# Patient Record
Sex: Male | Born: 1950
Health system: Southern US, Community
[De-identification: ages and names within clinical notes are randomized; demographics above are authoritative.]

## PROBLEM LIST (undated history)

## (undated) DIAGNOSIS — H9190 Unspecified hearing loss, unspecified ear: Secondary | ICD-10-CM

## (undated) DIAGNOSIS — C099 Malignant neoplasm of tonsil, unspecified: Secondary | ICD-10-CM

## (undated) DIAGNOSIS — R195 Other fecal abnormalities: Secondary | ICD-10-CM

## (undated) DIAGNOSIS — E785 Hyperlipidemia, unspecified: Secondary | ICD-10-CM

## (undated) DIAGNOSIS — F4310 Post-traumatic stress disorder, unspecified: Secondary | ICD-10-CM

## (undated) DIAGNOSIS — K1379 Other lesions of oral mucosa: Secondary | ICD-10-CM

## (undated) HISTORY — DX: Other fecal abnormalities: R19.5

## (undated) HISTORY — DX: Malignant neoplasm of tonsil, unspecified: C09.9

## (undated) HISTORY — DX: Hyperlipidemia, unspecified: E78.5

## (undated) HISTORY — PX: FINGER FRACTURE SURGERY: SHX638

## (undated) HISTORY — PX: COLONOSCOPY W/ POLYPECTOMY: SHX1380

## (undated) HISTORY — DX: Other lesions of oral mucosa: K13.79

---
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
## 2001-03-05 ENCOUNTER — Ambulatory Visit (HOSPITAL_BASED_OUTPATIENT_CLINIC_OR_DEPARTMENT_OTHER): Admission: RE | Admit: 2001-03-05 | Discharge: 2001-03-05 | Payer: Self-pay | Admitting: Family Medicine

## 2001-09-12 ENCOUNTER — Ambulatory Visit (HOSPITAL_BASED_OUTPATIENT_CLINIC_OR_DEPARTMENT_OTHER): Admission: RE | Admit: 2001-09-12 | Discharge: 2001-09-12 | Payer: Self-pay | Admitting: Family Medicine

## 2001-09-27 ENCOUNTER — Ambulatory Visit (HOSPITAL_COMMUNITY): Admission: RE | Admit: 2001-09-27 | Discharge: 2001-09-27 | Payer: Self-pay | Admitting: Family Medicine

## 2001-11-01 ENCOUNTER — Ambulatory Visit (HOSPITAL_COMMUNITY): Admission: RE | Admit: 2001-11-01 | Discharge: 2001-11-01 | Payer: Self-pay | Admitting: *Deleted

## 2003-07-29 ENCOUNTER — Encounter: Payer: Self-pay | Admitting: Emergency Medicine

## 2003-07-29 ENCOUNTER — Emergency Department (HOSPITAL_COMMUNITY): Admission: EM | Admit: 2003-07-29 | Discharge: 2003-07-29 | Payer: Self-pay | Admitting: Emergency Medicine

## 2015-12-19 NOTE — Progress Notes (Unsigned)
B/P : 140/77 Glucose: non Fasting: 100 taken by [facility] RN

## 2018-05-12 ENCOUNTER — Encounter: Payer: Self-pay | Admitting: Family Medicine

## 2018-05-12 ENCOUNTER — Ambulatory Visit (INDEPENDENT_AMBULATORY_CARE_PROVIDER_SITE_OTHER): Payer: Medicare Other | Admitting: Family Medicine

## 2018-05-12 VITALS — BP 132/73 | HR 87 | Temp 98.8°F | Ht 68.0 in | Wt 185.0 lb

## 2018-05-12 DIAGNOSIS — K1379 Other lesions of oral mucosa: Secondary | ICD-10-CM

## 2018-05-12 DIAGNOSIS — Z13228 Encounter for screening for other metabolic disorders: Secondary | ICD-10-CM | POA: Diagnosis not present

## 2018-05-12 DIAGNOSIS — Z7689 Persons encountering health services in other specified circumstances: Secondary | ICD-10-CM

## 2018-05-12 DIAGNOSIS — K1370 Unspecified lesions of oral mucosa: Secondary | ICD-10-CM | POA: Diagnosis not present

## 2018-05-12 DIAGNOSIS — R195 Other fecal abnormalities: Secondary | ICD-10-CM

## 2018-05-12 DIAGNOSIS — Z13220 Encounter for screening for lipoid disorders: Secondary | ICD-10-CM

## 2018-05-12 DIAGNOSIS — Z1159 Encounter for screening for other viral diseases: Secondary | ICD-10-CM | POA: Diagnosis not present

## 2018-05-12 HISTORY — DX: Other lesions of oral mucosa: K13.79

## 2018-05-12 HISTORY — DX: Other fecal abnormalities: R19.5

## 2018-05-12 NOTE — Progress Notes (Signed)
Subjective: ON:GEXBMWUXL care, oral concern HPI: [name] is a 67 y.o. male presenting to clinic today for:  1. Oral concern Patient reports onset of left-sided throat irritation about 6 years ago.  He notes that he thought that this was food caught in the tonsil or tonsil stone.  He has not had it evaluated because it was never particularly bothersome.  He notes that he feels like it seems more irritated and occasionally expels coagulated discharge, particularly when he clears his throat or brushes his teeth.  He reports associated left-sided neck pain and fullness.  No night sweats, unplanned weight loss, fevers, chills.  He was a smoker for about 15 years but discontinued about 19 years ago.  He is a retired English as a second language teacher.  2.  Positive fecal occult blood test Patient had a nurse come in for a wellness visit and he had a fit test performed.  This was positive.  He notes that he had polyps on colonoscopy over 10 years ago but has not had a colonoscopy since.  Last colonoscopy was done at some office in [place].  He is unsure which.  He would like to seek care within [facility] if possible.  Denies any gross blood in stool, nausea, vomiting, unplanned weight loss.  Past Medical History:  Diagnosis Date  . Hyperlipidemia    Past Surgical History:  Procedure Laterality Date  . APPENDECTOMY    . FINGER FRACTURE SURGERY Left    Social History   Socioeconomic History  . Marital status: Married    Spouse name: Not on file  . Number of children: Not on file  . Years of education: Not on file  . Highest education level: Not on file  Occupational History  . Occupation: English as a second language teacher, retired  Scientific laboratory technician  . Financial resource strain: Not on file  . Food insecurity:    Worry: Not on file    Inability: Not on file  . Transportation needs:    Medical: Not on file    Non-medical: Not on file  Tobacco Use  . Smoking status: Former Smoker    Years: 15.00    Types: Cigarettes   Last attempt to quit: 2000    Years since quitting: 19.4  . Smokeless tobacco: Never Used  Substance and Sexual Activity  . Alcohol use: Yes    Alcohol/week: 7.2 oz    Types: 12 Cans of beer per week  . Drug use: Never  . Sexual activity: Not Currently  Lifestyle  . Physical activity:    Days per week: Not on file    Minutes per session: Not on file  . Stress: Not on file  Relationships  . Social connections:    Talks on phone: Not on file    Gets together: Not on file    Attends religious service: Not on file    Active member of club or organization: Not on file    Attends meetings of clubs or organizations: Not on file    Relationship status: Not on file  . Intimate partner violence:    Fear of current or ex partner: Not on file    Emotionally abused: Not on file    Physically abused: Not on file    Forced sexual activity: Not on file  Other Topics Concern  . 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  He lives independently with his dog here in [place].  He  formally lived in [place].   No outpatient medications have been marked as taking for the 05/12/18 encounter (Office Visit) with [name], DO.   Family History  Problem Relation Age of Onset  . Diabetes Father    No Known Allergies   Health Maintenance: Colonoscopy, Hep C screen  ROS: Per HPI  Objective: Office vital signs reviewed. BP 132/73   Pulse 87   Temp 98.8 F (37.1 C) (Oral)   Ht '5\' 8"'  (1.727 m)   Wt 185 lb (83.9 kg)   BMI 28.13 kg/m    Physical Examination:  General: Awake, alert, well nourished, nontoxic, No acute distress HEENT: Normal    Neck: + enlarged and firm left sided cervical lymph node. No carotid bruits, no jvd    Ears: Tympanic membranes intact, normal light reflex, no erythema, no bulging    Eyes: PERRLA, extraocular movement in tact, sclera white    Nose: nasal turbinates moist, clear nasal discharge     Throat: moist mucus membranes, foul odor to breath appreciated.  Fungating mass appreciated along the left side of the oral cavity. Cardio: regular rate and rhythm, S1S2 heard, no murmurs appreciated Pulm: clear to auscultation bilaterally, no wheezes, rhonchi or rales; normal work of breathing on room air MSK: Ambulates independently.  Normal strength and tone. Psych: Mood stable, speech normal, affect appropriate, interactive, pleasant. Neuro: Hard of hearing but otherwise no focal neurologic deficits.  Depression screen PHQ 2/9 05/12/2018  Decreased Interest 0  Down, Depressed, Hopeless 0  PHQ - 2 Score 0    Assessment/ Plan: 67 y.o. male   1. Mass of oral cavity Concerning for oral cancer.  Risk factors include smoking history.  I have placed an urgent referral to ear nose and throat for further evaluation and consideration of biopsy.  I did discuss with patient that cancer could not be ruled out at this point but that this is not a definitive diagnosis until biopsy has been obtained.  Patient was somewhat tearful but voiced good understanding.  I encouraged him to call me with any questions or concerns. - Ambulatory referral to ENT - CBC with Differential  2. Positive FIT (fecal immunochemical test) History of polyps.  Will place referral to gastroenterology for further evaluation.  I have referred him to [facility] per his request. - Ambulatory referral to Gastroenterology  3. Encounter to establish care with new doctor Release of information form completed.  Will obtain previous medical records.  4. Encounter for screening for metabolic disorder - [id number]  5. Screening for lipid disorders - Lipid Panel  6. Encounter for hepatitis C screening test for low risk patient - Hepatitis C antibody   [name], DO [place] [phone]

## 2018-05-12 NOTE — Patient Instructions (Addendum)
The area of concern in the left side of your mouth appears to be a mass.  I would like you to see the ear, nose and throat specialist as soon as possible.  I have placed this referral.  I have also placed a referral to the gastroenterologist in [place] for the blood that was seen in your stool.  This may be from a polyp since you have had these in the past but I do think that it is worth further evaluating with a colonoscopy.  You had labs performed today.  You will be contacted with the results of the labs once they are available, usually in the next 3 days for routine lab work.

## 2018-05-13 ENCOUNTER — Encounter: Payer: Self-pay | Admitting: Hematology

## 2018-05-13 ENCOUNTER — Ambulatory Visit (INDEPENDENT_AMBULATORY_CARE_PROVIDER_SITE_OTHER): Payer: Medicare Other | Admitting: Otolaryngology

## 2018-05-13 DIAGNOSIS — R1312 Dysphagia, oropharyngeal phase: Secondary | ICD-10-CM | POA: Diagnosis not present

## 2018-05-13 DIAGNOSIS — C140 Malignant neoplasm of pharynx, unspecified: Secondary | ICD-10-CM

## 2018-05-13 LAB — LIPID PANEL
Chol/HDL Ratio: 5.2 ratio — ABNORMAL HIGH (ref 0.0–5.0)
Cholesterol, Total: 182 mg/dL (ref 100–199)
HDL: 35 mg/dL — ABNORMAL LOW (ref >39)
LDL Calculated: 127 mg/dL — ABNORMAL HIGH (ref 0–99)
Triglycerides: 102 mg/dL (ref 0–149)
VLDL Cholesterol Cal: 20 mg/dL (ref 5–40)

## 2018-05-13 LAB — CBC WITH DIFFERENTIAL/PLATELET
Basophils Absolute: 0 10*3/uL (ref 0.0–0.2)
Basos: 0 %
EOS (ABSOLUTE): 0.1 10*3/uL (ref 0.0–0.4)
Eos: 2 %
Hematocrit: 42 % (ref 37.5–51.0)
Hemoglobin: 14.2 g/dL (ref 13.0–17.7)
Immature Grans (Abs): 0 10*3/uL (ref 0.0–0.1)
Immature Granulocytes: 0 %
Lymphocytes Absolute: 1.1 10*3/uL (ref 0.7–3.1)
Lymphs: 32 %
MCH: 29.4 pg (ref 26.6–33.0)
MCHC: 33.8 g/dL (ref 31.5–35.7)
MCV: 87 fL (ref 79–97)
Monocytes Absolute: 0.4 10*3/uL (ref 0.1–0.9)
Monocytes: 11 %
Neutrophils Absolute: 1.9 10*3/uL (ref 1.4–7.0)
Neutrophils: 55 %
Platelets: 256 10*3/uL (ref 150–450)
RBC: 4.83 x10E6/uL (ref 4.14–5.80)
RDW: 13 % (ref 12.3–15.4)
WBC: 3.5 10*3/uL (ref 3.4–10.8)

## 2018-05-13 LAB — CMP14+EGFR
ALT: 10 IU/L (ref 0–44)
AST: 13 IU/L (ref 0–40)
Albumin/Globulin Ratio: 1.6 (ref 1.2–2.2)
Albumin: 4.2 g/dL (ref 3.6–4.8)
Alkaline Phosphatase: 67 IU/L (ref 39–117)
BUN/Creatinine Ratio: 13 (ref 10–24)
BUN: 18 mg/dL (ref 8–27)
Bilirubin Total: 0.6 mg/dL (ref 0.0–1.2)
CO2: 25 mmol/L (ref 20–29)
Calcium: 9.8 mg/dL (ref 8.6–10.2)
Chloride: 101 mmol/L (ref 96–106)
Creatinine, Ser: 1.34 mg/dL — ABNORMAL HIGH (ref 0.76–1.27)
GFR calc Af Amer: 62 mL/min/{1.73_m2} (ref >59)
GFR calc non Af Amer: 54 mL/min/{1.73_m2} — ABNORMAL LOW (ref >59)
Globulin, Total: 2.7 g/dL (ref 1.5–4.5)
Glucose: 98 mg/dL (ref 65–99)
Potassium: 4.2 mmol/L (ref 3.5–5.2)
Sodium: 143 mmol/L (ref 134–144)
Total Protein: 6.9 g/dL (ref 6.0–8.5)

## 2018-05-13 LAB — HEPATITIS C ANTIBODY: Hep C Virus Ab: <0.1 s/co ratio (ref 0.0–0.9)

## 2018-05-14 ENCOUNTER — Telehealth: Payer: Self-pay | Admitting: Family Medicine

## 2018-05-14 ENCOUNTER — Other Ambulatory Visit (INDEPENDENT_AMBULATORY_CARE_PROVIDER_SITE_OTHER): Payer: Self-pay | Admitting: Otolaryngology

## 2018-05-14 DIAGNOSIS — C099 Malignant neoplasm of tonsil, unspecified: Secondary | ICD-10-CM

## 2018-05-14 NOTE — Telephone Encounter (Signed)
Attempted to call patient regarding labs.  No answer and no voicemail.  His kidney function was slightly impaired with a creatinine of 1.34.  No previous labs for comparison.  This may be a result of dehydration or frequent NSAID use.  We will plan to recheck at next visit.  Electrolytes and liver function otherwise within normal limits.  CBC with no significant inflammation, evidence of infection or anemia.  Hepatitis C virus antibody negative.  His ASCVD risk score was 18.7%.  A cholesterol medication is recommended.  I would like to further discuss this with patient prior to starting.  Please have him schedule an appointment with me for cholesterol discussion and recheck of kidney function.  The 10-year ASCVD risk score Mikey Bussing DC Brooke Bonito., et al., 2013) is: 18.7%   Values used to calculate the score:     Age: 67 years     Sex: Male     Is Non-Hispanic African American: No     Diabetic: No     Tobacco smoker: No     Systolic Blood Pressure: 275 mmHg     Is BP treated: No     HDL Cholesterol: 35 mg/dL     Total Cholesterol: 182 mg/dL   Results for orders placed or performed in visit on 05/12/18 (from the past 72 hour(s))  CMP14+EGFR     Status: Abnormal   Collection Time: 05/12/18  9:49 AM  Result Value Ref Range   Glucose 98 65 - 99 mg/dL   BUN 18 8 - 27 mg/dL   Creatinine, Ser 1.34 (H) 0.76 - 1.27 mg/dL   GFR calc non Af Amer 54 (L) >59 mL/min/1.73   GFR calc Af Amer 62 >59 mL/min/1.73   BUN/Creatinine Ratio 13 10 - 24   Sodium 143 134 - 144 mmol/L   Potassium 4.2 3.5 - 5.2 mmol/L   Chloride 101 96 - 106 mmol/L   CO2 25 20 - 29 mmol/L   Calcium 9.8 8.6 - 10.2 mg/dL   Total Protein 6.9 6.0 - 8.5 g/dL   Albumin 4.2 3.6 - 4.8 g/dL   Globulin, Total 2.7 1.5 - 4.5 g/dL   Albumin/Globulin Ratio 1.6 1.2 - 2.2   Bilirubin Total 0.6 0.0 - 1.2 mg/dL   Alkaline Phosphatase 67 39 - 117 IU/L   AST 13 0 - 40 IU/L   ALT 10 0 - 44 IU/L  CBC with Differential     Status: None   Collection Time:  05/12/18  9:49 AM  Result Value Ref Range   WBC 3.5 3.4 - 10.8 x10E3/uL   RBC 4.83 4.14 - 5.80 x10E6/uL   Hemoglobin 14.2 13.0 - 17.7 g/dL   Hematocrit 42.0 37.5 - 51.0 %   MCV 87 79 - 97 fL   MCH 29.4 26.6 - 33.0 pg   MCHC 33.8 31.5 - 35.7 g/dL   RDW 13.0 12.3 - 15.4 %   Platelets 256 150 - 450 x10E3/uL    Comment:               **Please note reference interval change**   Neutrophils 55 Not Estab. %   Lymphs 32 Not Estab. %   Monocytes 11 Not Estab. %   Eos 2 Not Estab. %   Basos 0 Not Estab. %   Neutrophils Absolute 1.9 1.4 - 7.0 x10E3/uL   Lymphocytes Absolute 1.1 0.7 - 3.1 x10E3/uL   Monocytes Absolute 0.4 0.1 - 0.9 x10E3/uL   EOS (ABSOLUTE) 0.1 0.0 - 0.4 x10E3/uL  Basophils Absolute 0.0 0.0 - 0.2 x10E3/uL   Immature Granulocytes 0 Not Estab. %   Immature Grans (Abs) 0.0 0.0 - 0.1 x10E3/uL  Lipid Panel     Status: Abnormal   Collection Time: 05/12/18  9:49 AM  Result Value Ref Range   Cholesterol, Total 182 100 - 199 mg/dL   Triglycerides 102 0 - 149 mg/dL   HDL 35 (L) >39 mg/dL   VLDL Cholesterol Cal 20 5 - 40 mg/dL   LDL Calculated 127 (H) 0 - 99 mg/dL   Chol/HDL Ratio 5.2 (H) 0.0 - 5.0 ratio    Comment:                                   T. Chol/HDL Ratio                                             Men  Women                               1/2 Avg.Risk  3.4    3.3                                   Avg.Risk  5.0    4.4                                2X Avg.Risk  9.6    7.1                                3X Avg.Risk 23.4   11.0   Hepatitis C antibody     Status: None   Collection Time: 05/12/18  9:49 AM  Result Value Ref Range   Hep C Virus Ab <0.1 0.0 - 0.9 s/co ratio    Comment:                                   Negative:     < 0.8                              Indeterminate: 0.8 - 0.9                                   Positive:     > 0.9  The CDC recommends that a positive HCV antibody result  be followed up with a HCV Nucleic Acid Amplification  test  ([id number]).    Michaelanthony Kempton M. Lajuana Ripple, [facility]

## 2018-05-18 ENCOUNTER — Encounter: Payer: Self-pay | Admitting: Internal Medicine

## 2018-05-31 ENCOUNTER — Encounter (HOSPITAL_COMMUNITY)
Admission: RE | Admit: 2018-05-31 | Discharge: 2018-05-31 | Disposition: A | Discharge status: Home / Self Care | Payer: Medicare Other | Source: Ambulatory Visit | Attending: Otolaryngology | Admitting: Otolaryngology

## 2018-05-31 DIAGNOSIS — C099 Malignant neoplasm of tonsil, unspecified: Secondary | ICD-10-CM | POA: Insufficient documentation

## 2018-05-31 MED ORDER — FLUDEOXYGLUCOSE F - 18 (FDG) INJECTION
10.0000 | Freq: Once | INTRAVENOUS | Status: AC | PRN
  Administered 2018-05-31: 9.88 via INTRAVENOUS

## 2018-06-02 ENCOUNTER — Other Ambulatory Visit: Payer: Self-pay

## 2018-06-02 ENCOUNTER — Inpatient Hospital Stay (HOSPITAL_COMMUNITY): Payer: Medicare Other | Attending: Hematology | Admitting: Hematology

## 2018-06-02 ENCOUNTER — Encounter (HOSPITAL_COMMUNITY): Payer: Self-pay | Admitting: Hematology

## 2018-06-02 VITALS — BP 135/66 | HR 67 | Resp 16 | Ht 66.5 in | Wt 182.0 lb

## 2018-06-02 DIAGNOSIS — E785 Hyperlipidemia, unspecified: Secondary | ICD-10-CM

## 2018-06-02 DIAGNOSIS — C099 Malignant neoplasm of tonsil, unspecified: Secondary | ICD-10-CM

## 2018-06-02 DIAGNOSIS — I700 Atherosclerosis of aorta: Secondary | ICD-10-CM | POA: Insufficient documentation

## 2018-06-02 DIAGNOSIS — R634 Abnormal weight loss: Secondary | ICD-10-CM

## 2018-06-02 DIAGNOSIS — Z87891 Personal history of nicotine dependence: Secondary | ICD-10-CM | POA: Diagnosis not present

## 2018-06-02 HISTORY — DX: Malignant neoplasm of tonsil, unspecified: C09.9

## 2018-06-02 NOTE — Progress Notes (Signed)
START OFF PATHWAY REGIMEN - Head and Neck   OFF02110:5-Fluorouracil + Carboplatin q21 Days:   A cycle is every 21 days:     Carboplatin      5-Fluorouracil   **Always confirm dose/schedule in your pharmacy ordering system**  Patient Characteristics: Oropharynx, HPV Positive, Clinically Staged, T0-4, cN1-3 or T3-4, cN0 Disease Classification: Oropharynx HPV Status: Positive (+) AJCC N Category: cN2 AJCC 8 Stage Grouping: II Current Disease Status: No Distant Metastases and No Recurrent Disease AJCC T Category: T3 AJCC M Category: M0 Intent of Therapy: Curative Intent, Discussed with Patient

## 2018-06-02 NOTE — Assessment & Plan Note (Signed)
1.  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 Benjamine Mola on 05/13/2018, status post biopsy consistent with squamous cell carcinoma, P 16+ -Weight loss of 5 pounds in the last 1 month - Baseline hearing loss and chronic kidney disease with creatinine of 1.34. - Oropharyngeal examination shows a necrotic left tonsillar mass, not clearly visualized secondary to increased gag reflex.  There is a left lower neck lymph node palpable.  I discussed the results of the PET CT scan with the patient which showed level 2, 3, 5 meta stasis on the left neck and mildly metabolic right level 2 lymph node along with the left tonsillar primary.  No other hypermetabolic lesion seen. -I have recommended combination chemoradiation therapy.  He is not a candidate for high-dose cisplatin given his chronic kidney disease and hearing dysfunction.  He is also not a candidate for weekly cisplatin as his creatinine might get worse.  I have recommended 94-01 [place] protocol with 5-FU administered as a 24-hour continuous infusion at a dose of 600 mg per M square per day for 4 days and carboplatin given as a daily bolus of 70 mg/m2/day for 4 days.  Chemo will be given on days 1, 22 and 43.  We will have a port placed by 1 of our surgeons. -I have also made a referral to Dr. Quitman Livings for radiation consultation.  We will also make referral for dental evaluation.  Nutrition evaluation will also be requested.

## 2018-06-02 NOTE — Progress Notes (Signed)
[facility] NOTE  Patient Care Team: [name], DO as PCP - General (Family Medicine) Gala Romney Cristopher Estimable, MD as Consulting Physician (Gastroenterology)  CHIEF COMPLAINTS/PURPOSE OF CONSULTATION:  Left tonsillar cancer.  HISTORY OF PRESENTING ILLNESS:  [name] 67 y.o. male is seen in consultation today for further work-up and management of left tonsillar cancer.  He developed progressive dysphagia over the last several months.  He lost about 5 pounds in the last 1 month.  He has some difficulty swallowing foods.  He has to swallow from the right side of the throat.  He does not have any problems drinking liquids.  He was evaluated by Dr. Benjamine Mola and was noted to have a large ulcerated mass of the left tonsillar fossa, completely replacing left lateral pharyngeal wall.  The mass was extending down to the left hypopharynx.  This was biopsied and consistent with squamous cell carcinoma.  P 16 was positive.  He lives at home with his dog and is independent of all activities of daily living.  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  He has decreased hearing on both years.  His appetite has been good.  Energy levels are described as 75%.  No recent hospitalizations.  MEDICAL HISTORY:  Past Medical History:  Diagnosis Date  . Hyperlipidemia     SURGICAL HISTORY: Past Surgical History:  Procedure Laterality Date  . APPENDECTOMY  1969  . FINGER FRACTURE SURGERY Left     SOCIAL HISTORY: Social History   Socioeconomic History  . Marital status: Divorced    Spouse name: Not on file  . Number of children: Not on file  . Years of education: Not on file  . Highest education level: Not on file  Occupational History  . Occupation: English as a second language teacher, retired  Scientific laboratory technician  . Financial resource strain: Not on file  . Food insecurity:    Worry: Not on file    Inability: Not on file  . Transportation needs:    Medical: Not on file    Non-medical: Not on  file  Tobacco Use  . Smoking status: Former Smoker    Years: 15.00    Types: Cigarettes    Last attempt to quit: 2000    Years since quitting: 19.4  . Smokeless tobacco: Never Used  Substance and Sexual Activity  . Alcohol use: Yes    Alcohol/week: 7.2 oz    Types: 12 Cans of beer per week  . Drug use: Never  . Sexual activity: Not Currently  Lifestyle  . Physical activity:    Days per week: Not on file    Minutes per session: Not on file  . Stress: Not on file  Relationships  . Social connections:    Talks on phone: Not on file    Gets together: Not on file    Attends religious service: Not on file    Active member of club or organization: Not on file    Attends meetings of clubs or organizations: Not on file    Relationship status: Not on file  . Intimate partner violence:    Fear of current or ex partner: Not on file    Emotionally abused: Not on file    Physically abused: Not on file    Forced sexual activity: Not on file  Other Topics Concern  . Not on file  Social History Narrative   Mr. 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  He lives independently with his dog here in [place].  He formally lived in [place].    FAMILY HISTORY: Family History  Problem Relation Age of Onset  . Diabetes Father     ALLERGIES:  has No Known Allergies.  MEDICATIONS:  Current Outpatient Medications  Medication Sig Dispense Refill  . ibuprofen (ADVIL,MOTRIN) 200 MG tablet Take 200 mg by mouth every 6 (six) hours as needed.     No current facility-administered medications for this visit.     REVIEW OF SYSTEMS:   Constitutional: Denies fevers, chills or abnormal night sweats Eyes: Denies blurriness of vision, double vision or watery eyes Ears, nose, mouth, throat, and face: Denies mucositis or sore throat.  Decreased hearing. Respiratory: Denies cough, dyspnea or wheezes Cardiovascular: Denies palpitation, chest discomfort or lower extremity  swelling Gastrointestinal:  Denies nausea, heartburn or change in bowel habits.  Difficulty swallowing present. Skin: Denies abnormal skin rashes Lymphatics: Denies new lymphadenopathy or easy bruising Neurological:Denies numbness, tingling or new weaknesses Behavioral/Psych: Mood is stable, no new changes  All other systems were reviewed with the patient and are negative.  PHYSICAL EXAMINATION: ECOG PERFORMANCE STATUS: 1 - Symptomatic but completely ambulatory  Vitals:   06/02/18 1149  BP: 135/66  Pulse: 67  Resp: 16  SpO2: 100%   Filed Weights   06/02/18 1149  Weight: 182 lb (82.6 kg)    GENERAL:alert, no distress and comfortable SKIN: skin color, texture, turgor are normal, no rashes or significant lesions EYES: normal, conjunctiva are pink and non-injected, sclera clear OROPHARYNX: Large ulcerative mass of the left tonsillar fossa, appears necrotic  NECK: Left base of the neck lymphadenopathy palpable. LYMPH:  no palpable lymphadenopathy in the cervical, axillary or inguinal LUNGS: clear to auscultation and percussion with normal breathing effort HEART: regular rate & rhythm and no murmurs and no lower extremity edema ABDOMEN:abdomen soft, non-tender and normal bowel sounds Musculoskeletal:no cyanosis of digits and no clubbing  PSYCH: alert & oriented x 3 with fluent speech NEURO: no focal motor/sensory deficits  LABORATORY DATA:  I have reviewed the data as listed Lab Results  Component Value Date   WBC 3.5 05/12/2018   HGB 14.2 05/12/2018   HCT 42.0 05/12/2018   MCV 87 05/12/2018   PLT 256 05/12/2018     Chemistry      Component Value Date/Time   NA 143 05/12/2018 0949   K 4.2 05/12/2018 0949   CL 101 05/12/2018 0949   CO2 25 05/12/2018 0949   BUN 18 05/12/2018 0949   CREATININE 1.34 (H) 05/12/2018 0949      Component Value Date/Time   CALCIUM 9.8 05/12/2018 0949   ALKPHOS 67 05/12/2018 0949   AST 13 05/12/2018 0949   ALT 10 05/12/2018 0949   BILITOT  0.6 05/12/2018 0949       RADIOGRAPHIC STUDIES: I have personally reviewed the radiological images as listed and agreed with the findings in the report. Nm Pet Image Initial (pi) Skull Base To Thigh  Result Date: 06/01/2018 CLINICAL DATA:  Initial treatment strategy for malignant tumor of palatine tonsil. EXAM: NUCLEAR MEDICINE PET SKULL BASE TO THIGH TECHNIQUE: 9.9 mCi F-18 FDG was injected intravenously. Full-ring PET imaging was performed from the skull base to thigh after the radiotracer. CT data was obtained and used for attenuation correction and anatomic localization. Fasting blood glucose: 94 mg/dl COMPARISON:  None. FINDINGS: Mediastinal blood pool activity: SUV max 2.0 NECK: There is intense hypermetabolism in the left palatine tonsil with associated soft tissue  fullness on the CT images with max SUV 13.4. There are multiple hypermetabolic left level 2, level 3 and level 5 lymph nodes. Representative 1.3 cm left level 2 node with max SUV 9.9 (series 3/image 77). Representative 1.2 cm left level 3 node with max SUV 8.0 (series 3/image 95). Representative 0.7 cm left level 5 node with max SUV 2.3 (series 3/image 84). There is a 0.9 cm right level 2 mildly hypermetabolic node with max SUV 3.2 (series 3/image 81). Incidental CT findings: none CHEST: No hypermetabolic pulmonary findings. No hypermetabolic axillary, mediastinal or hilar lymphadenopathy. Incidental CT findings: Left lower lobe 4 mm solid pulmonary nodule (series 3/image 178), below PET resolution. No acute consolidative airspace disease, lung masses or additional significant pulmonary nodules. Coronary atherosclerosis. Atherosclerotic nonaneurysmal thoracic aorta. ABDOMEN/PELVIS: No abnormal hypermetabolic activity within the liver, pancreas, adrenal glands, or spleen. No hypermetabolic lymph nodes in the abdomen or pelvis. Incidental CT findings: Atherosclerotic nonaneurysmal abdominal aorta. SKELETON: No focal hypermetabolic activity  to suggest skeletal metastasis. Incidental CT findings: none IMPRESSION: 1. Intense hypermetabolism in the left palatine tonsil with associated soft tissue fullness on the CT images, compatible with the provided history of primary left palatine tonsil malignancy. 2. Hypermetabolic left level 2, level 3 and level 5 neck nodal metastases. Solitary mildly prominent and mildly hypermetabolic right level 2 neck lymph node, suspicious for contralateral nodal metastasis. 3. No evidence of hypermetabolic metastatic disease in the chest, abdomen, pelvis or skeleton. 4. Solitary 4 mm solid left lower lobe pulmonary nodule, below PET resolution, recommend attention on follow-up chest CT in 3-6 months. 5.  Aortic Atherosclerosis (ICD10-I70.0). Electronically Signed   By: Ilona Sorrel M.D.   On: 06/01/2018 09:06    ASSESSMENT & PLAN:  Tonsillar cancer ([place]) 1.  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 Benjamine Mola on 05/13/2018, status post biopsy consistent with squamous cell carcinoma, P 16+ -Weight loss of 5 pounds in the last 1 month - Baseline hearing loss and chronic kidney disease with creatinine of 1.34. - Oropharyngeal examination shows a necrotic left tonsillar mass, not clearly visualized secondary to increased gag reflex.  There is a left lower neck lymph node palpable.  I discussed the results of the PET CT scan with the patient which showed level 2, 3, 5 meta stasis on the left neck and mildly metabolic right level 2 lymph node along with the left tonsillar primary.  No other hypermetabolic lesion seen. -I have recommended combination chemoradiation therapy.  He is not a candidate for high-dose cisplatin given his chronic kidney disease and hearing dysfunction.  He is also not a candidate for weekly cisplatin as his creatinine might get worse.  I have recommended 94-01 [place] protocol with 5-FU  administered as a 24-hour continuous infusion at a dose of 600 mg per M square per day for 4 days and carboplatin given as a daily bolus of 70 mg/m2/day for 4 days.  Chemo will be given on days 1, 22 and 43.  We will have a port placed by 1 of our surgeons. -I have also made a referral to Dr. Quitman Livings for radiation consultation.  We will also make referral for dental evaluation.  Nutrition evaluation will also be requested.  Orders Placed This Encounter  Procedures  . Ambulatory referral to Social Work    Referral Priority:   Routine    Referral Type:   Consultation  Referral Reason:   Specialty Services Required    Number of Visits Requested:   1    All questions were answered. The patient knows to call the clinic with any problems, questions or concerns. Total time spent is 60 minutes with more than 50% of the time spent face-to-face discussing new diagnosis, PET CT scan results, treatment options and side effects.  Time was also spent in coordination of care.     Derek Jack, MD 06/02/2018 5:51 PM

## 2018-06-03 ENCOUNTER — Other Ambulatory Visit (HOSPITAL_COMMUNITY): Payer: Self-pay | Admitting: Pharmacist

## 2018-06-03 ENCOUNTER — Encounter (HOSPITAL_COMMUNITY): Payer: Self-pay | Admitting: Lab

## 2018-06-03 NOTE — Progress Notes (Unsigned)
Referral sent to [facility].  Records faxed on 6/13

## 2018-06-04 ENCOUNTER — Other Ambulatory Visit (HOSPITAL_COMMUNITY): Payer: Self-pay | Admitting: Hematology

## 2018-06-08 ENCOUNTER — Ambulatory Visit: Payer: Medicare Other | Admitting: General Surgery

## 2018-06-08 ENCOUNTER — Encounter (HOSPITAL_COMMUNITY): Payer: Self-pay | Admitting: General Practice

## 2018-06-08 NOTE — Progress Notes (Signed)
[facility] Psychosocial Distress Screening Clinical Social Work  Clinical Social Work was referred by distress screening protocol.  The patient scored a 6 on the Psychosocial Distress Thermometer which indicates moderate distress. Clinical Social Worker contacted patient by phone to assess for distress and other psychosocial needs. Wants friend [name] able to get information about his appointments and needs.  Feels like he is having difficulty keeping track of appointments and "things to do."  Can be linked w [facility], but has had difficulty making this happen, is eligible for benefits but lack of transport has limited his access to [place] services.  CSW discussed various options for increased support in community, mailed information packet as well as print out of his upcoming appointments.  Encouraged patient and/or caregiver to call as needed for help/support.  ONCBCN DISTRESS SCREENING 06/02/2018  Screening Type Initial Screening  Distress experienced in past week (1-10) 6  Practical problem type Insurance  Emotional problem type Adjusting to illness  Spiritual/Religous concerns type Relating to God  Information Concerns Type Lack of info about diagnosis;Lack of info about treatment;Lack of info about complementary therapy choices  Physical Problem type Loss of appetitie;Mouth sores/swallowing    Clinical Social Worker follow up needed: No.  If yes, follow up plan: Patient would like his friend [name] to be able to get information about his appointments and needs.    [name], [place], LCSW Clinical Social Worker Phone:  [phone]

## 2018-06-09 ENCOUNTER — Encounter (HOSPITAL_COMMUNITY): Payer: Self-pay | Admitting: General Practice

## 2018-06-09 NOTE — Progress Notes (Signed)
[place]  Call to the [place] to investigate whether patient is linked to the [place].  Patient has a PCP - Dr Mel Almond at [facility], has not been seen since 2018.  Per patient, he has missed appointments due to lack of transportation and has not been able to connect w [facility] resources.  CSW left VM for CSW Little Ishikawa, CSW assigned to patient's PCP; requested that CSW reach out to patient and also call CSW to discuss best way to access VA resources on patient's behalf.  Edwyna Shell, LCSW Clinical Social Worker Phone:  [phone]

## 2018-06-10 ENCOUNTER — Encounter (HOSPITAL_COMMUNITY): Payer: Self-pay | Admitting: Dentistry

## 2018-06-10 ENCOUNTER — Ambulatory Visit (HOSPITAL_COMMUNITY): Payer: Self-pay | Admitting: Dentistry

## 2018-06-10 VITALS — BP 159/82 | HR 66 | Temp 98.8°F

## 2018-06-10 DIAGNOSIS — C099 Malignant neoplasm of tonsil, unspecified: Secondary | ICD-10-CM | POA: Diagnosis not present

## 2018-06-10 DIAGNOSIS — K036 Deposits [accretions] on teeth: Secondary | ICD-10-CM

## 2018-06-10 DIAGNOSIS — K08409 Partial loss of teeth, unspecified cause, unspecified class: Secondary | ICD-10-CM

## 2018-06-10 DIAGNOSIS — M264 Malocclusion, unspecified: Secondary | ICD-10-CM

## 2018-06-10 DIAGNOSIS — Z01818 Encounter for other preprocedural examination: Secondary | ICD-10-CM

## 2018-06-10 DIAGNOSIS — K06020 Generalized gingival recession, unspecified: Secondary | ICD-10-CM

## 2018-06-10 DIAGNOSIS — K0530 Chronic periodontitis, unspecified: Secondary | ICD-10-CM

## 2018-06-10 DIAGNOSIS — K0889 Other specified disorders of teeth and supporting structures: Secondary | ICD-10-CM | POA: Insufficient documentation

## 2018-06-10 NOTE — Progress Notes (Signed)
DENTAL CONSULTATION  Date of Consultation:  06/10/2018 Patient Name:   [name] Date of Birth:   1951/07/03 Medical Record Number: [id number]  VITALS: BP (!) 159/82 (BP Location: Right Arm)   Pulse 66   Temp 98.8 F (37.1 C)   CHIEF COMPLAINT: Patient referred by Dr. Ellin Saba and Dr. Louis Matte for a dental consultation.  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 Patient with anticipated chemoradiation therapy. Patient is now seen as part of a medically necessary pre-chemoradiation therapy dental protocol examination.  Patient currently denies acute toothaches, swellings, or abscesses. Patient was last seen by Dr. Sonda Rumble, General Dentist, for dental restoration on the lower right molar in October of 2018. This was provided as a free service for veterans near [place]. Patient denies having regular dental care but does try to get an annual cleaning as part of those same [employer]. Patient denies having partial dentures. Patient denies having dental phobia.  PROBLEM LIST: Patient Active Problem List   Diagnosis Date Noted  . Tonsillar cancer (HCC) 06/02/2018    Priority: High  . Mass of oral cavity 05/12/2018  . Positive FIT (fecal immunochemical test) 05/12/2018    PMH: Past Medical History:  Diagnosis Date  . Hyperlipidemia   . Mass of oral cavity 05/12/2018  . Positive FIT (fecal immunochemical test) 05/12/2018  . Tonsillar cancer (HCC) 06/02/2018    PSH: Past Surgical History:  Procedure Laterality Date  . APPENDECTOMY  1969  . FINGER FRACTURE SURGERY Left     ALLERGIES: No Known Allergies  MEDICATIONS: Current Outpatient Medications  Medication Sig Dispense Refill  . ibuprofen (ADVIL,MOTRIN) 200 MG tablet Take 200 mg by mouth every 6 (six) hours as needed.     No current facility-administered medications for this visit.     LABS: Lab Results  Component Value Date   WBC 3.5  05/12/2018   HGB 14.2 05/12/2018   HCT 42.0 05/12/2018   MCV 87 05/12/2018   PLT 256 05/12/2018      Component Value Date/Time   NA 143 05/12/2018 0949   K 4.2 05/12/2018 0949   CL 101 05/12/2018 0949   CO2 25 05/12/2018 0949   GLUCOSE 98 05/12/2018 0949   BUN 18 05/12/2018 0949   CREATININE 1.34 (H) 05/12/2018 0949   CALCIUM 9.8 05/12/2018 0949   GFRNONAA 54 (L) 05/12/2018 0949   GFRAA 62 05/12/2018 0949   No results found for: INR, PROTIME No results found for: PTT  SOCIAL HISTORY: Social History   Socioeconomic History  . Marital status: Divorced    Spouse name: Not on file  . Number of children: Not on file  . Years of education: Not on file  . Highest education level: Not on file  Occupational History  . Occupation: Cytogeneticist, retired  Engineer, production  . Financial resource strain: Not on file  . Food insecurity:    Worry: Not on file    Inability: Not on file  . Transportation needs:    Medical: Not on file    Non-medical: Not on file  Tobacco Use  . Smoking status: Former Smoker    Years: 15.00    Types: Cigarettes    Last attempt to quit: 2000    Years since quitting: 19.4  . Smokeless tobacco: Never Used  Substance and Sexual Activity  . Alcohol use: Yes    Alcohol/week: 7.2 oz    Types: 12 Cans of beer per  week  . Drug use: Never  . Sexual activity: Not Currently  Lifestyle  . Physical activity:    Days per week: Not on file    Minutes per session: Not on file  . Stress: Not on file  Relationships  . Social connections:    Talks on phone: Not on file    Gets together: Not on file    Attends religious service: Not on file    Active member of club or organization: Not on file    Attends meetings of clubs or organizations: Not on file    Relationship status: Not on file  . Intimate partner violence:    Fear of current or ex partner: Not on file    Emotionally abused: Not on file    Physically abused: Not on file    Forced sexual activity: Not on  file  Other Topics Concern  . 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  He lives independently with his dog here in [place].  He formally lived in [place].    FAMILY HISTORY: Family History  Problem Relation Age of Onset  . Diabetes Father     REVIEW OF SYSTEMS: Reviewed with the patient as per History of present illness. Psych: Patient denies having dental phobia.  DENTAL HISTORY: CHIEF COMPLAINT: Patient referred by Dr. Ellin Saba and Dr. Louis Matte for a dental consultation.  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 Patient with anticipated chemoradiation therapy. Patient is now seen as part of a medically necessary pre-chemoradiation therapy dental protocol examination.  Patient currently denies acute toothaches, swellings, or abscesses. Patient was last seen by Dr. Sonda Rumble, General Dentist, for dental restoration on the lower right molar in October of 2018. This was provided as a free service for veterans near [place]. Patient denies having regular dental care but does try to get an annual cleaning as part of those same [employer]. Patient denies having partial dentures. Patient denies having dental phobia.  DENTAL EXAMINATION: GENERAL: The patient is a well-developed,well-nourished male in no acute distress. HEAD AND NECK:  Left neck lymphadenopathy is palpable. Patient denies acute TMJ symptoms. Maximum interincisal opening is measured at 50 mm. INTRAORAL EXAM: The patient has normal saliva. The left tonsillar mass is consistent with cancer diagnosis.  DENTITION:  Patient is missing tooth numbers 1, 14 -16, 17, 19, 31 and 32. PERIODONTAL:  Patient has chronic periodontitis with plaque and calculus accumulations, generalized gingival recession, and incipient to moderate bone loss. Tooth mobility is noted. DENTAL CARIES/SUBOPTIMAL RESTORATIONS:  The patient has multiple dental caries as per dental charting form. ENDODONTIC:  Patient currently denies acute pulpitis symptoms. There is no evidence of periapical pathology or radiolucency. CROWN AND BRIDGE: There are no crown or bridge restorations. PROSTHODONTIC: The patient denies having partial dentures. OCCLUSION: The patient has a poor occlusal scheme secondary to multiple missing teeth, supra-eruption and drifting of the unopposed teeth into the edentulous areas, and lack of replacement of missing teeth with dental prostheses.  RADIOGRAPHIC INTERPRETATION: An orthopantogram was taken and supplemented with a full series of dental radiographs. There are multiple missing teeth. There is supra-eruption and drifting of the unopposed teeth into the edentulous areas. There is moderate bone loss noted. Radiographic calculus is noted. Dental caries are noted. There is no evidence of periapical pathology or radiolucency.  ASSESSMENTS: 1. Squamous cell carcinoma of the  left tonsil 2. Pre-chemoradiation therapy dental protocol 3. Chronic periodontitis with bone loss 4. Gingival recession 5. Accretions 6. Tooth mobility 7. Multiple missing teeth 8. Supra-eruption and drifting of the unopposed teeth into the edentulous areas 9. Poor occlusal scheme and malocclusion   PLAN/RECOMMENDATIONS: 1. I discussed the risks, benefits, and complications of various treatment options with the patient in relationship to his medical and dental conditions, anticipated chemoradiation therapy, and chemoradiation therapy side effects to include xerostomia, radiation caries, trismus, mucositis, taste changes, gum and jawbone changes, and risk for infection and osteoradionecrosis.. We discussed various treatment options to include no treatment, multiple extractions with alveoloplasty, pre-prosthetic surgery as indicated, periodontal therapy, dental restorations, root canal therapy, crown and bridge therapy, implant  therapy, and replacement of missing teeth as indicated. The patient currently wishes to proceed with multiple extractions with alveoloplasty and gross debridement of remaining dentition in the operating room with general anesthesia. This has been scheduled for Tuesday, 06/15/2018 at 7:30 AM at [facility].  Patient will then follow-up with the general dentist of his choice for dental restorations involving the maxillary anterior teeth. Patient also agreed to have impressions made today for the fabrication of fluoride trays and possible scatter protection devices.  These will be inserted after initial healing from the anticipated dental extraction procedures. Patient will then need at least 2 weeks of healing prior to start of the chemoradiation therapy.   2. Discussion of findings with medical team and coordination of future medical and dental care as needed.  I spent in excess of  120 minutes during the conduct of this consultation and >50% of this time involved direct face-to-face encounter for counseling and/or coordination of the patient's care.    Charlynne Pander, DDS

## 2018-06-10 NOTE — Patient Instructions (Signed)

## 2018-06-11 ENCOUNTER — Inpatient Hospital Stay (HOSPITAL_COMMUNITY): Payer: Medicare Other

## 2018-06-11 NOTE — Progress Notes (Signed)
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  Past medical history HLD, CKD.  Met with patient this am in clinic.  Patient comes to appointment alone.  Reports that appetite has been so-so, his taste is off.  Reports that he has been chewing foods really well due to dysphagia.  Reports the only food that does not go done well and that he is avoiding is toast.  Reports that he does not think he ate breakfast this am.  Reports sometimes will eat doughnuts or cereal.  Reports ate stuffed peppers last night for supper.  Lunch is sometimes tuna fish sandwich.   Reports that he lives alone and cooks himself. Reports that he use to could make a mean meatloaf but can't remember how to make it now.  Reports that he is having a hard time keeping all information strait and remembering everything.  Has not tried oral nutrition supplements.  Noted planning oral surgery on 6/25 multiple extractions and debridement.    Planning radiation in [place].  Nutrition Focused Physical Exam: deferred today  Medications: reviewed  Labs: creatinine 1.34  Anthropometrics:   Height: 66.5 inches Weight: 182 lb on 5/22.   UBW: 185-190 lb about 2 months ago BMI: 28  3% weight loss in the last month   Estimated Energy Needs  Kcals: 2075-2500 calories Protein: 103-125 g Fluid: 2.5 L/d  NUTRITION DIAGNOSIS: Predicted suboptimal nutrition related to tonsil cancer as evidenced by common side effects from progression of treatment.    MALNUTRITION DIAGNOSIS: continue to monitor   INTERVENTION:   Discussed importance of good nutrition during treatment and weight maintenance.   Discussed soft moist protein foods and list of foods provided. Discussed foods high in calories and protein and fact sheet provided to patient.   Gave samples of oral  nutrition supplements for patient to try.  Coupons given as well.   Recommend SLP evaluation and follow during radiation treatment and will notifiy Dr. Raliegh Ip regarding recommendations.      MONITORING, EVALUATION, GOAL: Patient will consume adequate calories and protein during treatment to maintain weight   NEXT VISIT:  Friday, July 12  [name] B. Zenia Resides, [place], [place] Registered Dietitian [phone] (pager)

## 2018-06-14 ENCOUNTER — Other Ambulatory Visit: Payer: Self-pay

## 2018-06-14 ENCOUNTER — Encounter (HOSPITAL_COMMUNITY): Payer: Self-pay | Admitting: *Deleted

## 2018-06-14 NOTE — Progress Notes (Signed)
[name] denies chest pain or shortness of breath.  Patient is working on transportation to the hospital. I instructed patient to not take any Advil today and not drink any beer.

## 2018-06-15 ENCOUNTER — Ambulatory Visit (HOSPITAL_COMMUNITY): Payer: Medicare Other | Admitting: Emergency Medicine

## 2018-06-15 ENCOUNTER — Encounter (HOSPITAL_COMMUNITY): Payer: Self-pay | Admitting: Certified Registered"

## 2018-06-15 ENCOUNTER — Encounter (HOSPITAL_COMMUNITY)
Admission: RE | Disposition: A | Discharge status: Home / Self Care | Payer: Self-pay | Source: Ambulatory Visit | Attending: Dentistry

## 2018-06-15 ENCOUNTER — Ambulatory Visit (HOSPITAL_COMMUNITY)
Admission: RE | Admit: 2018-06-15 | Discharge: 2018-06-15 | Disposition: A | Discharge status: Home / Self Care | Payer: Medicare Other | Source: Ambulatory Visit | Attending: Dentistry | Admitting: Dentistry

## 2018-06-15 ENCOUNTER — Ambulatory Visit: Payer: Medicare Other | Admitting: General Surgery

## 2018-06-15 DIAGNOSIS — K0889 Other specified disorders of teeth and supporting structures: Secondary | ICD-10-CM

## 2018-06-15 DIAGNOSIS — Z01818 Encounter for other preprocedural examination: Secondary | ICD-10-CM

## 2018-06-15 DIAGNOSIS — M264 Malocclusion, unspecified: Secondary | ICD-10-CM | POA: Insufficient documentation

## 2018-06-15 DIAGNOSIS — K0530 Chronic periodontitis, unspecified: Secondary | ICD-10-CM

## 2018-06-15 DIAGNOSIS — C099 Malignant neoplasm of tonsil, unspecified: Secondary | ICD-10-CM | POA: Insufficient documentation

## 2018-06-15 DIAGNOSIS — K036 Deposits [accretions] on teeth: Secondary | ICD-10-CM

## 2018-06-15 DIAGNOSIS — Z87891 Personal history of nicotine dependence: Secondary | ICD-10-CM | POA: Diagnosis not present

## 2018-06-15 HISTORY — PX: MULTIPLE EXTRACTIONS WITH ALVEOLOPLASTY: SHX5342

## 2018-06-15 HISTORY — DX: Unspecified hearing loss, unspecified ear: H91.90

## 2018-06-15 HISTORY — DX: Post-traumatic stress disorder, unspecified: F43.10

## 2018-06-15 LAB — BASIC METABOLIC PANEL
ANION GAP: 10 (ref 5–15)
BUN: 13 mg/dL (ref 8–23)
CALCIUM: 8.7 mg/dL — AB (ref 8.9–10.3)
CO2: 23 mmol/L (ref 22–32)
CREATININE: 1.12 mg/dL (ref 0.61–1.24)
Chloride: 107 mmol/L (ref 98–111)
GFR calc non Af Amer: >60 mL/min (ref >60)
Glucose, Bld: 107 mg/dL — ABNORMAL HIGH (ref 70–99)
Potassium: 3.7 mmol/L (ref 3.5–5.1)
SODIUM: 140 mmol/L (ref 135–145)

## 2018-06-15 LAB — CBC
HCT: 43.6 % (ref 39.0–52.0)
Hemoglobin: 14.2 g/dL (ref 13.0–17.0)
MCH: 30.2 pg (ref 26.0–34.0)
MCHC: 32.6 g/dL (ref 30.0–36.0)
MCV: 92.8 fL (ref 78.0–100.0)
PLATELETS: 191 10*3/uL (ref 150–400)
RBC: 4.7 MIL/uL (ref 4.22–5.81)
RDW: 14.6 % (ref 11.5–15.5)
WBC: 2.5 10*3/uL — AB (ref 4.0–10.5)

## 2018-06-15 SURGERY — MULTIPLE EXTRACTION WITH ALVEOLOPLASTY
Anesthesia: General

## 2018-06-15 MED ORDER — LIDOCAINE 2% (20 MG/ML) 5 ML SYRINGE
INTRAMUSCULAR | Status: AC
  Filled 2018-06-15: qty 5

## 2018-06-15 MED ORDER — ONDANSETRON HCL 4 MG/2ML IJ SOLN
INTRAMUSCULAR | Status: AC
  Filled 2018-06-15: qty 2

## 2018-06-15 MED ORDER — CEFAZOLIN SODIUM-DEXTROSE 2-4 GM/100ML-% IV SOLN
2.0000 g | Freq: Once | INTRAVENOUS | Status: AC
  Administered 2018-06-15: 2 g via INTRAVENOUS
  Filled 2018-06-15: qty 100

## 2018-06-15 MED ORDER — ONDANSETRON HCL 4 MG/2ML IJ SOLN
INTRAMUSCULAR | Status: DC | PRN
  Administered 2018-06-15: 4 mg via INTRAVENOUS

## 2018-06-15 MED ORDER — SODIUM CHLORIDE 0.9 % IJ SOLN
INTRAMUSCULAR | Status: AC
  Filled 2018-06-15: qty 10

## 2018-06-15 MED ORDER — OXYCODONE-ACETAMINOPHEN 5-325 MG PO TABS: ORAL_TABLET | ORAL | 0 refills | Status: DC

## 2018-06-15 MED ORDER — ROCURONIUM BROMIDE 10 MG/ML (PF) SYRINGE
PREFILLED_SYRINGE | INTRAVENOUS | Status: DC | PRN
  Administered 2018-06-15: 50 mg via INTRAVENOUS

## 2018-06-15 MED ORDER — PROPOFOL 10 MG/ML IV BOLUS
INTRAVENOUS | Status: DC | PRN
  Administered 2018-06-15: 170 mg via INTRAVENOUS

## 2018-06-15 MED ORDER — DEXAMETHASONE SODIUM PHOSPHATE 10 MG/ML IJ SOLN
INTRAMUSCULAR | Status: AC
  Filled 2018-06-15: qty 1

## 2018-06-15 MED ORDER — OXYCODONE-ACETAMINOPHEN 5-325 MG PO TABS: 1.0000 | ORAL_TABLET | ORAL | Status: DC | PRN

## 2018-06-15 MED ORDER — PROPOFOL 10 MG/ML IV BOLUS
INTRAVENOUS | Status: AC
  Filled 2018-06-15: qty 20

## 2018-06-15 MED ORDER — OXYCODONE HCL 5 MG PO TABS: 5.0000 mg | ORAL_TABLET | Freq: Once | ORAL | Status: DC | PRN

## 2018-06-15 MED ORDER — LACTATED RINGERS IV SOLN: INTRAVENOUS | Status: DC

## 2018-06-15 MED ORDER — FENTANYL CITRATE (PF) 100 MCG/2ML IJ SOLN
INTRAMUSCULAR | Status: DC | PRN
  Administered 2018-06-15 (×2): 50 ug via INTRAVENOUS

## 2018-06-15 MED ORDER — LIDOCAINE-EPINEPHRINE 2 %-1:100000 IJ SOLN
INTRAMUSCULAR | Status: DC | PRN
  Administered 2018-06-15: 5.1 mL via INTRADERMAL

## 2018-06-15 MED ORDER — EPHEDRINE SULFATE 50 MG/ML IJ SOLN
INTRAMUSCULAR | Status: AC
  Filled 2018-06-15: qty 1

## 2018-06-15 MED ORDER — SUGAMMADEX SODIUM 200 MG/2ML IV SOLN
INTRAVENOUS | Status: DC | PRN
  Administered 2018-06-15: 170 mg via INTRAVENOUS

## 2018-06-15 MED ORDER — 0.9 % SODIUM CHLORIDE (POUR BTL) OPTIME
TOPICAL | Status: DC | PRN
  Administered 2018-06-15: 1000 mL

## 2018-06-15 MED ORDER — BUPIVACAINE-EPINEPHRINE 0.5% -1:200000 IJ SOLN
INTRAMUSCULAR | Status: DC | PRN
  Administered 2018-06-15: 3.6 mL

## 2018-06-15 MED ORDER — LACTATED RINGERS IV SOLN
INTRAVENOUS | Status: DC | PRN
  Administered 2018-06-15: 07:00:00 via INTRAVENOUS

## 2018-06-15 MED ORDER — OXYCODONE HCL 5 MG/5ML PO SOLN: 5.0000 mg | Freq: Once | ORAL | Status: DC | PRN

## 2018-06-15 MED ORDER — LIDOCAINE-EPINEPHRINE 2 %-1:100000 IJ SOLN
INTRAMUSCULAR | Status: AC
Start: 2018-06-15 — End: ?
  Filled 2018-06-15: qty 1.7

## 2018-06-15 MED ORDER — FENTANYL CITRATE (PF) 100 MCG/2ML IJ SOLN: 25.0000 ug | INTRAMUSCULAR | Status: DC | PRN

## 2018-06-15 MED ORDER — BUPIVACAINE-EPINEPHRINE (PF) 0.5% -1:200000 IJ SOLN
INTRAMUSCULAR | Status: AC
  Filled 2018-06-15: qty 1.8

## 2018-06-15 MED ORDER — LIDOCAINE 2% (20 MG/ML) 5 ML SYRINGE
INTRAMUSCULAR | Status: DC | PRN
Start: 2018-06-15 — End: 2018-06-15
  Administered 2018-06-15: 40 mg via INTRAVENOUS

## 2018-06-15 MED ORDER — MIDAZOLAM HCL 2 MG/2ML IJ SOLN
INTRAMUSCULAR | Status: DC | PRN
  Administered 2018-06-15: 2 mg via INTRAVENOUS

## 2018-06-15 MED ORDER — OXYMETAZOLINE HCL 0.05 % NA SOLN
NASAL | Status: AC
  Filled 2018-06-15: qty 15

## 2018-06-15 MED ORDER — ONDANSETRON HCL 4 MG/2ML IJ SOLN: 4.0000 mg | Freq: Once | INTRAMUSCULAR | Status: DC | PRN

## 2018-06-15 MED ORDER — ROCURONIUM BROMIDE 50 MG/5ML IV SOLN
INTRAVENOUS | Status: AC
  Filled 2018-06-15: qty 1

## 2018-06-15 MED ORDER — FENTANYL CITRATE (PF) 250 MCG/5ML IJ SOLN
INTRAMUSCULAR | Status: AC
  Filled 2018-06-15: qty 5

## 2018-06-15 MED ORDER — MIDAZOLAM HCL 2 MG/2ML IJ SOLN
INTRAMUSCULAR | Status: AC
  Filled 2018-06-15: qty 2

## 2018-06-15 MED ORDER — DEXAMETHASONE SODIUM PHOSPHATE 10 MG/ML IJ SOLN
INTRAMUSCULAR | Status: DC | PRN
  Administered 2018-06-15: 10 mg via INTRAVENOUS

## 2018-06-15 SURGICAL SUPPLY — 41 items
ALCOHOL 70% 16 OZ (MISCELLANEOUS) ×3 IMPLANT
ATTRACTOMAT 16X20 MAGNETIC DRP (DRAPES) ×3 IMPLANT
BANDAGE HEMOSTAT MRDH 4X4 STRL (MISCELLANEOUS) IMPLANT
BLADE SURG 15 STRL LF DISP TIS (BLADE) ×2 IMPLANT
BLADE SURG 15 STRL SS (BLADE) ×4
BNDG HEMOSTAT MRDH 4X4 STRL (MISCELLANEOUS)
CONT SPEC 4OZ CLIKSEAL STRL BL (MISCELLANEOUS) ×3 IMPLANT
COVER SURGICAL LIGHT HANDLE (MISCELLANEOUS) ×3 IMPLANT
GAUZE PACKING FOLDED 2  STR (GAUZE/BANDAGES/DRESSINGS) ×2
GAUZE PACKING FOLDED 2 STR (GAUZE/BANDAGES/DRESSINGS) ×1 IMPLANT
GAUZE SPONGE 4X4 12PLY STRL (GAUZE/BANDAGES/DRESSINGS) ×3 IMPLANT
GAUZE SPONGE 4X4 16PLY XRAY LF (GAUZE/BANDAGES/DRESSINGS) ×3 IMPLANT
GLOVE BIO SURGEON STRL SZ 6.5 (GLOVE) ×2 IMPLANT
GLOVE BIO SURGEONS STRL SZ 6.5 (GLOVE) ×1
GLOVE SURG ORTHO 8.0 STRL STRW (GLOVE) ×3 IMPLANT
GOWN STRL REUS W/ TWL LRG LVL3 (GOWN DISPOSABLE) ×1 IMPLANT
GOWN STRL REUS W/TWL 2XL LVL3 (GOWN DISPOSABLE) ×3 IMPLANT
GOWN STRL REUS W/TWL LRG LVL3 (GOWN DISPOSABLE) ×2
HEMOSTAT SURGICEL 2X14 (HEMOSTASIS) IMPLANT
KIT BASIN OR (CUSTOM PROCEDURE TRAY) ×3 IMPLANT
KIT TURNOVER KIT B (KITS) ×3 IMPLANT
MANIFOLD NEPTUNE WASTE (CANNULA) ×3 IMPLANT
NEEDLE BLUNT 16X1.5 OR ONLY (NEEDLE) ×3 IMPLANT
NEEDLE DENTAL 27 LONG (NEEDLE) ×6 IMPLANT
NS IRRIG 1000ML POUR BTL (IV SOLUTION) ×3 IMPLANT
PACK EENT II TURBAN DRAPE (CUSTOM PROCEDURE TRAY) ×3 IMPLANT
PAD ARMBOARD 7.5X6 YLW CONV (MISCELLANEOUS) ×3 IMPLANT
SPONGE SURGIFOAM ABS GEL 100 (HEMOSTASIS) ×3 IMPLANT
SPONGE SURGIFOAM ABS GEL 12-7 (HEMOSTASIS) IMPLANT
SPONGE SURGIFOAM ABS GEL SZ50 (HEMOSTASIS) IMPLANT
SUCTION FRAZIER HANDLE 10FR (MISCELLANEOUS) ×2
SUCTION TUBE FRAZIER 10FR DISP (MISCELLANEOUS) ×1 IMPLANT
SUT CHROMIC 3 0 PS 2 (SUTURE) ×9 IMPLANT
SUT CHROMIC 4 0 P 3 18 (SUTURE) IMPLANT
SYR 50ML SLIP (SYRINGE) ×3 IMPLANT
TOWEL NATURAL 10PK STERILE (DISPOSABLE) ×3 IMPLANT
TUBE CONNECTING 12'X1/4 (SUCTIONS) ×1
TUBE CONNECTING 12X1/4 (SUCTIONS) ×2 IMPLANT
WATER STERILE IRR 1000ML POUR (IV SOLUTION) ×3 IMPLANT
WATER TABLETS ICX (MISCELLANEOUS) ×3 IMPLANT
YANKAUER SUCT BULB TIP NO VENT (SUCTIONS) ×3 IMPLANT

## 2018-06-15 NOTE — Anesthesia Postprocedure Evaluation (Signed)
Anesthesia Post Note  Patient: [name]  Procedure(s) Performed: Extraction of tooth #'s 2,12,13,18,20,and 21 with alveoloplasty and gross debridement of remaining teeth (N/A )     Patient location during evaluation: PACU Anesthesia Type: General Level of consciousness: awake and alert Pain management: pain level controlled Vital Signs Assessment: post-procedure vital signs reviewed and stable Respiratory status: spontaneous breathing, nonlabored ventilation, respiratory function stable and patient connected to nasal cannula oxygen Cardiovascular status: blood pressure returned to baseline and stable Postop Assessment: no apparent nausea or vomiting Anesthetic complications: no    Last Vitals:  Vitals:   06/15/18 0935 06/15/18 0945  BP: (!) 156/78 (!) 167/83  Pulse: 74 80  Resp: 20   Temp:    SpO2: 97% 97%    Last Pain:  Vitals:   06/15/18 0945  TempSrc:   PainSc: 0-No pain                 Javaun Dimperio COKER

## 2018-06-15 NOTE — Anesthesia Preprocedure Evaluation (Signed)
Anesthesia Evaluation  Patient identified by MRN, date of birth, ID band Patient awake    Reviewed: Allergy & Precautions, NPO status , Patient's Chart, lab work & pertinent test results  Airway Mallampati: II  TM Distance: >3 FB Neck ROM: Full    Dental  (+) Teeth Intact, Dental Advisory Given   Pulmonary former smoker,    breath sounds clear to auscultation       Cardiovascular  Rhythm:Regular Rate:Normal     Neuro/Psych    GI/Hepatic   Endo/Other    Renal/GU      Musculoskeletal   Abdominal   Peds  Hematology   Anesthesia Other Findings   Reproductive/Obstetrics                             Anesthesia Physical Anesthesia Plan  ASA: II  Anesthesia Plan: General   Post-op Pain Management:    Induction: Intravenous  PONV Risk Score and Plan: 1 and Ondansetron and Dexamethasone  Airway Management Planned: Nasal ETT  Additional Equipment:   Intra-op Plan:   Post-operative Plan: Extubation in OR  Informed Consent: I have reviewed the patients History and Physical, chart, labs and discussed the procedure including the risks, benefits and alternatives for the proposed anesthesia with the patient or authorized representative who has indicated his/her understanding and acceptance.   Dental advisory given  Plan Discussed with: CRNA and Anesthesiologist  Anesthesia Plan Comments:         Anesthesia Quick Evaluation

## 2018-06-15 NOTE — Anesthesia Procedure Notes (Signed)
Procedure Name: Intubation Date/Time: 06/15/2018 7:38 AM Performed by: Barrington Ellison, CRNA Pre-anesthesia Checklist: Patient identified, Emergency Drugs available, Suction available and Patient being monitored Patient Re-evaluated:Patient Re-evaluated prior to induction Oxygen Delivery Method: Circle System Utilized Preoxygenation: Pre-oxygenation with 100% oxygen Induction Type: IV induction Ventilation: Mask ventilation without difficulty Laryngoscope Size: Glidescope and 4 Grade View: Grade I Nasal Tubes: Nasal prep performed, Nasal Rae and Left Tube size: 7.5 mm Number of attempts: 1 Airway Equipment and Method: Stylet and Oral airway Placement Confirmation: ETT inserted through vocal cords under direct vision,  positive ETCO2 and breath sounds checked- equal and bilateral Secured at: 28 cm Tube secured with: Tape Dental Injury: Teeth and Oropharynx as per pre-operative assessment

## 2018-06-15 NOTE — Progress Notes (Signed)
Pt is ready to be discharged at this time. VSS. Pt's ride unable to come until 3pm today.

## 2018-06-15 NOTE — H&P (Signed)
06/15/2018  Patient:            [name] Date of Birth:  November 27, 1951 MRN:                [id number]   BP (!) 147/79   Pulse 87   Temp 98.6 F (37 C) (Oral)   Resp 18   Ht 5' 6.5" (1.689 m)   Wt 182 lb (82.6 kg)   SpO2 98%   BMI 28.94 kg/m   [name] is a 67 year old male recently diagnosed with squamous cell carcinoma of the left tonsil.  Patient with anticipated chemoradiation therapy.  Patient was seen as part of a medically necessary pre-chemoradiation therapy dental protocol examination.  Patient currently scheduled for multiple extractions with alveoloplasty and gross debridement of remaining dentition in the operating room with general anesthesia.  The patient denies any acute medical or dental changes. Please see note from Dr. Ellin Saba dated 06/02/2018 to act as H&P for dental operating room procedure.  [name], DDS     [facility] CONSULT NOTE  Patient Care Team: Raliegh Ip, DO as PCP - General (Family Medicine) Jena Gauss [name], MD as Consulting Physician (Gastroenterology)  CHIEF COMPLAINTS/PURPOSE OF CONSULTATION:  Left tonsillar cancer.  HISTORY OF PRESENTING ILLNESS:  [name] 67 y.o. male is seen in consultation today for further work-up and management of left tonsillar cancer.  He developed progressive dysphagia over the last several months.  He lost about 5 pounds in the last 1 month.  He has some difficulty swallowing foods.  He has to swallow from the right side of the throat.  He does not have any problems drinking liquids.  He was evaluated by Dr. Suszanne Conners and was noted to have a large ulcerated mass of the left tonsillar fossa, completely replacing left lateral pharyngeal wall.  The mass was extending down to the left hypopharynx.  This was biopsied and consistent with squamous cell carcinoma.  P 16 was positive.  He lives at home with his dog and is independent of all activities of daily living.  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  He has decreased hearing on both years.  His appetite has been good.  Energy levels are described as 75%.  No recent hospitalizations.  MEDICAL HISTORY:      Past Medical History:  Diagnosis Date  . Hyperlipidemia     SURGICAL HISTORY: Past Surgical History:  Procedure Laterality Date  . APPENDECTOMY  1969  . FINGER FRACTURE SURGERY Left     SOCIAL HISTORY: Social History        Socioeconomic History  . Marital status: Divorced    Spouse name: Not on file  . Number of children: Not on file  . Years of education: Not on file  . Highest education level: Not on file  Occupational History  . Occupation: Cytogeneticist, retired  Engineer, production  . Financial resource strain: Not on file  . Food insecurity:    Worry: Not on file    Inability: Not on file  . Transportation needs:    Medical: Not on file    Non-medical: Not on file  Tobacco Use  . Smoking status: Former Smoker    Years: 15.00    Types: Cigarettes    Last attempt to quit: 2000    Years since quitting: 19.4  . Smokeless tobacco: Never Used  Substance and Sexual Activity  . Alcohol use:  Yes    Alcohol/week: 7.2 oz    Types: 12 Cans of beer per week  . Drug use: Never  . Sexual activity: Not Currently  Lifestyle  . Physical activity:    Days per week: Not on file    Minutes per session: Not on file  . Stress: Not on file  Relationships  . Social connections:    Talks on phone: Not on file    Gets together: Not on file    Attends religious service: Not on file    Active member of club or organization: Not on file    Attends meetings of clubs or organizations: Not on file    Relationship status: Not on file  . Intimate partner violence:    Fear of current or ex partner: Not on file    Emotionally abused: Not on file    Physically abused: Not on file    Forced sexual activity: Not on file  Other Topics Concern  .  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  He lives independently with his dog here in [place].  He formally lived in [place].    FAMILY HISTORY:      Family History  Problem Relation Age of Onset  . Diabetes Father     ALLERGIES:  has No Known Allergies.  MEDICATIONS:        Current Outpatient Medications  Medication Sig Dispense Refill  . ibuprofen (ADVIL,MOTRIN) 200 MG tablet Take 200 mg by mouth every 6 (six) hours as needed.     No current facility-administered medications for this visit.     REVIEW OF SYSTEMS:   Constitutional: Denies fevers, chills or abnormal night sweats Eyes: Denies blurriness of vision, double vision or watery eyes Ears, nose, mouth, throat, and face: Denies mucositis or sore throat.  Decreased hearing. Respiratory: Denies cough, dyspnea or wheezes Cardiovascular: Denies palpitation, chest discomfort or lower extremity swelling Gastrointestinal:  Denies nausea, heartburn or change in bowel habits.  Difficulty swallowing present. Skin: Denies abnormal skin rashes Lymphatics: Denies new lymphadenopathy or easy bruising Neurological:Denies numbness, tingling or new weaknesses Behavioral/Psych: Mood is stable, no new changes  All other systems were reviewed with the patient and are negative.  PHYSICAL EXAMINATION: ECOG PERFORMANCE STATUS: 1 - Symptomatic but completely ambulatory     Vitals:   06/02/18 1149  BP: 135/66  Pulse: 67  Resp: 16  SpO2: 100%      Filed Weights   06/02/18 1149  Weight: 182 lb (82.6 kg)    GENERAL:alert, no distress and comfortable SKIN: skin color, texture, turgor are normal, no rashes or significant lesions EYES: normal, conjunctiva are pink and non-injected, sclera clear OROPHARYNX: Large ulcerative mass of the left tonsillar fossa, appears necrotic  NECK: Left base of the neck lymphadenopathy palpable. LYMPH:  no palpable  lymphadenopathy in the cervical, axillary or inguinal LUNGS: clear to auscultation and percussion with normal breathing effort HEART: regular rate & rhythm and no murmurs and no lower extremity edema ABDOMEN:abdomen soft, non-tender and normal bowel sounds Musculoskeletal:no cyanosis of digits and no clubbing  PSYCH: alert & oriented x 3 with fluent speech NEURO: no focal motor/sensory deficits  LABORATORY DATA:  I have reviewed the data as listed RecentLabs       Lab Results  Component Value Date   WBC 3.5 05/12/2018   HGB 14.2 05/12/2018   HCT 42.0 05/12/2018   MCV 87 05/12/2018  PLT 256 05/12/2018       Chemistry   Labs(Brief)          Component Value Date/Time   NA 143 05/12/2018 0949   K 4.2 05/12/2018 0949   CL 101 05/12/2018 0949   CO2 25 05/12/2018 0949   BUN 18 05/12/2018 0949   CREATININE 1.34 (H) 05/12/2018 0949     Labs(Brief)          Component Value Date/Time   CALCIUM 9.8 05/12/2018 0949   ALKPHOS 67 05/12/2018 0949   AST 13 05/12/2018 0949   ALT 10 05/12/2018 0949   BILITOT 0.6 05/12/2018 0949         RADIOGRAPHIC STUDIES: I have personally reviewed the radiological images as listed and agreed with the findings in the report.  ImagingResults  Nm Pet Image Initial (pi) Skull Base To Thigh  Result Date: 06/01/2018 CLINICAL DATA:  Initial treatment strategy for malignant tumor of palatine tonsil. EXAM: NUCLEAR MEDICINE PET SKULL BASE TO THIGH TECHNIQUE: 9.9 mCi F-18 FDG was injected intravenously. Full-ring PET imaging was performed from the skull base to thigh after the radiotracer. CT data was obtained and used for attenuation correction and anatomic localization. Fasting blood glucose: 94 mg/dl COMPARISON:  None. FINDINGS: Mediastinal blood pool activity: SUV max 2.0 NECK: There is intense hypermetabolism in the left palatine tonsil with associated soft tissue fullness on the CT images with max SUV 13.4. There are  multiple hypermetabolic left level 2, level 3 and level 5 lymph nodes. Representative 1.3 cm left level 2 node with max SUV 9.9 (series 3/image 77). Representative 1.2 cm left level 3 node with max SUV 8.0 (series 3/image 95). Representative 0.7 cm left level 5 node with max SUV 2.3 (series 3/image 84). There is a 0.9 cm right level 2 mildly hypermetabolic node with max SUV 3.2 (series 3/image 81). Incidental CT findings: none CHEST: No hypermetabolic pulmonary findings. No hypermetabolic axillary, mediastinal or hilar lymphadenopathy. Incidental CT findings: Left lower lobe 4 mm solid pulmonary nodule (series 3/image 178), below PET resolution. No acute consolidative airspace disease, lung masses or additional significant pulmonary nodules. Coronary atherosclerosis. Atherosclerotic nonaneurysmal thoracic aorta. ABDOMEN/PELVIS: No abnormal hypermetabolic activity within the liver, pancreas, adrenal glands, or spleen. No hypermetabolic lymph nodes in the abdomen or pelvis. Incidental CT findings: Atherosclerotic nonaneurysmal abdominal aorta. SKELETON: No focal hypermetabolic activity to suggest skeletal metastasis. Incidental CT findings: none IMPRESSION: 1. Intense hypermetabolism in the left palatine tonsil with associated soft tissue fullness on the CT images, compatible with the provided history of primary left palatine tonsil malignancy. 2. Hypermetabolic left level 2, level 3 and level 5 neck nodal metastases. Solitary mildly prominent and mildly hypermetabolic right level 2 neck lymph node, suspicious for contralateral nodal metastasis. 3. No evidence of hypermetabolic metastatic disease in the chest, abdomen, pelvis or skeleton. 4. Solitary 4 mm solid left lower lobe pulmonary nodule, below PET resolution, recommend attention on follow-up chest CT in 3-6 months. 5.  Aortic Atherosclerosis (ICD10-I70.0). Electronically Signed   By: Delbert Phenix M.D.   On: 06/01/2018 09:06     ASSESSMENT & PLAN:   Tonsillar cancer (HCC) 1.  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 Suszanne Conners on 05/13/2018, status post biopsy consistent with squamous cell carcinoma, P 16+ -Weight loss of 5 pounds in the last 1 month - Baseline hearing loss and chronic kidney disease with creatinine of 1.34. -  Oropharyngeal examination shows a necrotic left tonsillar mass, not clearly visualized secondary to increased gag reflex.  There is a left lower neck lymph node palpable.  I discussed the results of the PET CT scan with the patient which showed level 2, 3, 5 meta stasis on the left neck and mildly metabolic right level 2 lymph node along with the left tonsillar primary.  No other hypermetabolic lesion seen. -I have recommended combination chemoradiation therapy.  He is not a candidate for high-dose cisplatin given his chronic kidney disease and hearing dysfunction.  He is also not a candidate for weekly cisplatin as his creatinine might get worse.  I have recommended 94-01 [place] protocol with 5-FU administered as a 24-hour continuous infusion at a dose of 600 mg per M square per day for 4 days and carboplatin given as a daily bolus of 70 mg/m2/day for 4 days.  Chemo will be given on days 1, 22 and 43.  We will have a port placed by 1 of our surgeons. -I have also made a referral to Dr. Louis Matte for radiation consultation.  We will also make referral for dental evaluation.  Nutrition evaluation will also be requested.       Orders Placed This Encounter  Procedures  . Ambulatory referral to Social Work    Referral Priority:   Routine    Referral Type:   Consultation    Referral Reason:   Specialty Services Required    Number of Visits Requested:   1    All questions were answered. The patient knows to call the clinic with any problems, questions or concerns. Total time spent is 60 minutes with  more than 50% of the time spent face-to-face discussing new diagnosis, PET CT scan results, treatment options and side effects.  Time was also spent in coordination of care.     Doreatha Massed, MD 06/02/2018 5:51 PM              Electronically signed by Doreatha Massed, MD at 06/02/2018 5:58 PM

## 2018-06-15 NOTE — Op Note (Signed)
OPERATIVE REPORT  Patient:            [name] Date of Birth:  1951-04-03 MRN:                [id number]   DATE OF PROCEDURE:  06/15/2018  PREOPERATIVE DIAGNOSES: 1.  Squamous cell carcinoma of the left tonsil 2.  Pre-chemoradiation therapy dental protocol 3.  Chronic periodontitis 4.  Loose teeth 5.  Malocclusion  POSTOPERATIVE DIAGNOSES: 1.  Squamous cell carcinoma of the left tonsil 2.  Pre-chemoradiation therapy dental protocol 3.  Chronic periodontitis 4.  Loose teeth 5.  Malocclusion  OPERATIONS: 1. Multiple extraction of tooth numbers 2, 12, 13, 18, 20, and 21 2. 4 Quadrants of alveoloplasty 3. Gross debridement of remaining dentition   SURGEON: Charlynne Pander, DDS  ASSISTANT: Pearletha Alfred (dental assistant)  ANESTHESIA: General anesthesia via nasoendotracheal tube.  MEDICATIONS: 1. Ancef 2 g IV prior to invasive dental procedures. 2. Local anesthesia with a total utilization of 3 carpules each containing 34 mg of lidocaine with 0.017 mg of epinephrine as well as 2 carpules each containing 9 mg of bupivacaine with 0.009 mg of epinephrine.  SPECIMENS: There are 6 teeth that were discarded.  DRAINS: None  CULTURES: None  COMPLICATIONS: None  ESTIMATED BLOOD LOSS: Less than 50 mls.  INTRAVENOUS FLUIDS: 800 mLs of Lactated ringers solution.  INDICATIONS: The patient was recently diagnosed with squamous cell carcinoma of the left tonsil.  A medically necessary dental consultation was then requested to evaluate poor dentition.  The patient was examined and treatment planned for total extractions with alveoloplasty and gross debridement remaining dentition in the operating with general anesthesia.  This treatment plan was formulated to decrease the risks and complications associated with dental infection from affecting the patient's systemic health and to prevent future complications such as an infection and osteoradionecrosis.  OPERATIVE  FINDINGS: Patient was examined operating room number 9.  The teeth were identified for extraction. The patient was noted be affected by chronic periodontitis, accretions, loose teeth, and malocclusion.   DESCRIPTION OF PROCEDURE: Patient was brought to the main operating room number 9. Patient was then placed in the supine position on the operating table. General anesthesia was then induced per the anesthesia team. The patient was then prepped and draped in the usual manner for dental medicine procedure. A timeout was performed. The patient was identified and procedures were verified. A throat pack was placed at this time. The oral cavity was then thoroughly examined with the findings noted above. The patient was then ready for dental medicine procedure as follows:  Local anesthesia was then administered sequentially with a total utilization of 3 carpules each containing 34 mg of lidocaine with 0.017 mg of epinephrine as well as 2 carpules  each containing 9 mg bupivacaine with 0.009 mg of epinephrine.  The Maxillary left and right quadrants first approached. Anesthesia was then delivered utilizing infiltration with lidocaine with epinephrine. A #15 blade incision was then made from the maxillary right tuberosity and extended to the mesial of #3.  A  surgical flap was then carefully reflected.  Tooth #2 was subluxated with a series of straight elevators removed with a 150 forceps without complications.   Alveoloplasty was then performed utilizing a ronguers and bone file. The surgical site was then irrigated with copious amounts of sterile saline. The tissues were approximated and trimmed appropriately.  A piece of Surgifoam was placed the extraction socket appropriately.  The surgical site was then  closed from the maxillary right tuberosity and extended to the distal of #3 with 3-0 chromic gut suture in a continuous interrupted suture technique x1.  At this point time the maxillary left quadrant was  approached.  A 15 blade incision was made from the distal of #15 extended to the mesial of #11.  Surgical flap was then carefully reflected.  Tooth numbers 12 and 13 were then subluxated with a series straight elevators.  The coronal aspect of tooth numbers 12 and 13 were then removed leaving the retained roots remaining.  The retained roots were then elevated out with a root tip pick appropriately without complication.  Alveoloplasty was then performed utilizing a ronguers and bone file to help achieve primary closure.  Surgical site was irrigated with copious amounts sterile saline.  The tissues were approximated and trimmed appropriately.  A piece of Surgifoam placed in the extraction sockets appropriately.  The maxillary left surgical site was then closed from the distal #15 extended the distal #11 utilizing 3-0 chromic gut suture in a continuous interrupted suture technique x1.  At this point time, the mandibular quadrants were approached. The patient was given an inferior alveolar nerve blocks and long buccal nerve block to the mandibular left quadrant utilizing the bupivacaine with epinephrine. Further infiltration was then achieved utilizing the lidocaine with epinephrine. A 15 blade incision was then made from the distal of number 17 and extended to the mesial of #22.  A surgical flap was then carefully reflected. Tooth numbers 20-21 were then subluxated with a series of straight elevators. Tooth numbers 20 and 21 was then removed with a 151 forceps without complications.  A surgical handpiece and bur and copious amounts sterile water were used to remove buccal and interseptal bone around tooth #18.  This tooth was then removed with a 23 forceps without complications.  Alveoloplasty was then performed utilizing a rongeurs and bone file to help achieve primary closure. The tissues were approximated and trimmed appropriately. The surgical sites were then irrigated with copious amounts of sterile saline.  A  piece of Surgifoam was placed in the extraction sockets appropriately. The mandibular left surgical site was then closed from the distal of #17 extended to the distal #22 utilizing 3-0 chromic gut suture in a continuous interrupted suture technique x1.  One individual interrupted suture was then placed to further close surgical site as needed.   At this point time, the remaining dentition was approached.  A sonic scaler was used to remove accretions.  A series of hand curettes were then used to further remove accretions.  The Sonic scaler was then again used to refine the removal of accretions as needed.  This completed the gross debridement procedure.   At this point time, the entire mouth was irrigated with copious amounts of sterile saline. The patient was examined for complications, seeing none, the dental medicine procedure was deemed to be complete. The throat pack was removed at this time. An oral airway was then placed at the request of the anesthesia team. A series of 4 x 4 gauze were placed in the mouth to aid hemostasis. The patient was then handed over to the anesthesia team for final disposition. After an appropriate amount of time, the patient was extubated and taken to the postanesthsia care unit in good condition. All counts were correct for the dental medicine procedure.  The patient is to return to Dental Medicine on Monday at 2 PM for insertion of upper and lower fluoride trays and  scatter protection devices prior to the simulation appointment scheduled for Tuesday with Dr. Louis Matte.   Charlynne Pander, DDS.

## 2018-06-15 NOTE — Transfer of Care (Signed)
Immediate Anesthesia Transfer of Care Note  Patient: [name]  Procedure(s) Performed: Extraction of tooth #'s 2,12,13,18,20,and 21 with alveoloplasty and gross debridement of remaining teeth (N/A )  Patient Location: PACU  Anesthesia Type:General  Level of Consciousness: awake, alert  and oriented  Airway & Oxygen Therapy: Patient Spontanous Breathing  Post-op Assessment: Report given to RN  Post vital signs: Reviewed and stable  Last Vitals:  Vitals Value Taken Time  BP    Temp    Pulse 79 06/15/2018  9:20 AM  Resp 18 06/15/2018  9:20 AM  SpO2 98 % 06/15/2018  9:20 AM  Vitals shown include unvalidated device data.  Last Pain:  Vitals:   06/15/18 0659  TempSrc:   PainSc: 0-No pain         Complications: No apparent anesthesia complications

## 2018-06-15 NOTE — Progress Notes (Signed)
PRE-OPERATIVE NOTE:  06/15/2018 [name] [id number]  VITALS: BP (!) 147/79   Pulse 87   Temp 98.6 F (37 C) (Oral)   Resp 18   Ht 5' 6.5" (1.689 m)   Wt 182 lb (82.6 kg)   SpO2 98%   BMI 28.94 kg/m   Lab Results  Component Value Date   WBC 2.5 (L) 06/15/2018   HGB 14.2 06/15/2018   HCT 43.6 06/15/2018   MCV 92.8 06/15/2018   PLT 191 06/15/2018   BMET    Component Value Date/Time   NA 143 05/12/2018 0949   K 4.2 05/12/2018 0949   CL 101 05/12/2018 0949   CO2 25 05/12/2018 0949   GLUCOSE 98 05/12/2018 0949   BUN 18 05/12/2018 0949   CREATININE 1.34 (H) 05/12/2018 0949   CALCIUM 9.8 05/12/2018 0949   GFRNONAA 54 (L) 05/12/2018 0949   GFRAA 62 05/12/2018 0949    No results found for: INR, PROTIME No results found for: PTT   [name] presents for multiple dental extractions with alveoloplasty and gross debridement remaining dentition in the operating room with general anesthesia.     SUBJECTIVE: The patient denies any acute medical or dental changes and agrees to proceed with treatment as planned.  EXAM: No sign of acute dental changes.  ASSESSMENT: Patient is affected by chronic periodontitis, accretions, malocclusion, and loose teeth  PLAN: Patient agrees to proceed with treatment as planned in the operating room as previously discussed and accepts the risks, benefits, and complications of the proposed treatment. Patient is aware of the risk for bleeding, bruising, swelling, infection, pain, nerve damage, soft tissue damage, damage to adjacent teeth, sinus involvement, root tip fracture, mandible fracture, and the risks of complications associated with the anesthesia. Patient also is aware of the potential for other complications not mentioned above.   Lenn Cal, DDS

## 2018-06-15 NOTE — Discharge Instructions (Signed)

## 2018-06-16 ENCOUNTER — Encounter (HOSPITAL_COMMUNITY): Payer: Self-pay | Admitting: Dentistry

## 2018-06-18 ENCOUNTER — Ambulatory Visit (HOSPITAL_COMMUNITY): Payer: Self-pay | Admitting: Dentistry

## 2018-06-18 ENCOUNTER — Encounter (HOSPITAL_COMMUNITY): Payer: Self-pay | Admitting: Dentistry

## 2018-06-18 VITALS — BP 148/78 | HR 80 | Temp 98.5°F

## 2018-06-18 DIAGNOSIS — Z463 Encounter for fitting and adjustment of dental prosthetic device: Secondary | ICD-10-CM

## 2018-06-18 DIAGNOSIS — K08199 Complete loss of teeth due to other specified cause, unspecified class: Secondary | ICD-10-CM

## 2018-06-18 DIAGNOSIS — C099 Malignant neoplasm of tonsil, unspecified: Secondary | ICD-10-CM

## 2018-06-18 DIAGNOSIS — Z01818 Encounter for other preprocedural examination: Secondary | ICD-10-CM

## 2018-06-18 MED ORDER — SODIUM FLUORIDE 1.1 % DT GEL: DENTAL | 99 refills | Status: DC

## 2018-06-18 NOTE — Patient Instructions (Signed)
Plan/recommendations: 1. Continue salt water rinses as needed to aid healing. 2. Advance diet as tolerated 3. Patient to brush teeth after meals and at bedtime. Use fluoride in the fluoride trays as instructed. Prescription for FluoriSHIELD has been sent to [facility] with refills for one year. 4. Patient is to contact Dr. Delcie Roch for restoration of caries involving tooth numbers 8 through 11 as needed. 5. Patient to be evaluated for suture removal on approximately 06/28/2018.  Patient to call Dental Medicine for appointment for oral examination during Chemoradiation therapy in approximately 2-3 weeks if so desired.  Dr. Enrique Sack   FLUORIDE TRAYS PATIENT INSTRUCTIONS    Obtain prescription from the pharmacy.  Don't be surprised if it needs to be ordered.  Be sure to let the pharmacy know when you are close to needing a new refill for them to have it ready for you without interruption of Fluoride use.  The best time to use your Fluoride is before bed time.  You must brush your teeth very well and floss before using the Fluoride in order to get the best use out of the Fluoride treatments.  Place 1 drop of Fluoride gel per tooth in the tray.  Place the tray on your lower teeth and your upper teeth.  Make sure the trays are seated all the way.  Remember, they only fit one way on your teeth.  Insert for 5 full minutes.  At the end of the 5 minutes, take the trays out.  SPIT OUT excess. .  Do NOT rinse your mouth!  Do NOT eat or drink after treatments for at least 30 minutes.  This is why the best time for your treatments is before bedtime.  Clean the inside of your Fluoride trays using COLD WATER and a toothbrush.  In order to keep your Trays from discoloring and free from odors, soak them overnight in denture cleaners such as Efferdent.  Do not use bleach or non denture products.  Store the trays in a safe dry place AWAY from any heat until your next  treatment.  If anything happens to your Fluoride trays, or they don't fit as well after any dental work, please let us know as soon as possible.

## 2018-06-18 NOTE — Progress Notes (Signed)
06/18/2018  Patient Name:   TREYVON BLAHUT Date of Birth:   1951-10-19 Medical Record Number: [id number]  BP (!) 148/78 (BP Location: Left Arm)   Pulse 80   Temp 98.5 F (36.9 C)   NAREK KNISS is status post multiple extractions with alveoloplasty and gross Debridement of remaining dentition in the operating room on 06/15/2018. Patient now presents for insertion of upper and lower fluoride trays and scatter protection devices.  SUBJECTIVE: Patient with minimal complaints of pain from the dental extraction sites. Sutures are still intact.   OBJECTIVE: There is no sign of infection, heme, or ooze. Sutures are intact. Clots are present. The patient is healing in by generalized primary closure.  PROCEDURE: Appliances were tried in and adjusted as needed. [place] Trismus device was previously fabricated at 50 mm using 25 sticks. Postop instructions were provided in a written and verbal format concerning the use and care of appliances. All questions were answered.  Plan/recommendations: 1. Continue salt water rinses as needed to aid healing. 2. Advance diet as tolerated 3. Patient to brush teeth after meals and at bedtime. Use fluoride in the fluoride trays as instructed. Prescription for FluoriSHIELD has been sent to [facility] with refills for one year. 4. Patient is to contact Dr. Delcie Roch for restoration of caries involving tooth numbers 8 through 11 as needed. 5. Patient to be evaluated for suture removal on approximately 06/28/2018.  Patient to call Dental Medicine for appointment for oral examination during Chemoradiation therapy in approximately 2-3 weeks if so desired.   Patient to call if other questions or problems arise before then.   Lenn Cal, DDS

## 2018-06-21 ENCOUNTER — Encounter (HOSPITAL_COMMUNITY): Payer: Self-pay | Admitting: Dentistry

## 2018-06-22 ENCOUNTER — Telehealth (HOSPITAL_COMMUNITY): Payer: Self-pay | Admitting: *Deleted

## 2018-06-22 ENCOUNTER — Ambulatory Visit (HOSPITAL_COMMUNITY): Payer: Medicare Other | Attending: Hematology | Admitting: Speech Pathology

## 2018-06-22 ENCOUNTER — Ambulatory Visit (HOSPITAL_COMMUNITY): Payer: Medicare Other | Admitting: Hematology

## 2018-06-22 DIAGNOSIS — R1311 Dysphagia, oral phase: Secondary | ICD-10-CM | POA: Insufficient documentation

## 2018-06-22 NOTE — Telephone Encounter (Signed)
Multiple attempts made to contact patient.  This encounter will now be closed  

## 2018-06-22 NOTE — Telephone Encounter (Signed)
Spoke with Barnett Applebaum at [facility] in [place]. Patient did not get his simulation done today due to him oversleeping.  They will reschedule this for later this week.  They will not be able to start patient's radiation treatments on 7/15. They want to start him on 7/18.  I spoke with Dr. Delton Coombes and he is okay with patient starting on that date.  Schedule has been changed reflecting this.

## 2018-06-23 ENCOUNTER — Encounter (HOSPITAL_COMMUNITY): Payer: Self-pay | Admitting: *Deleted

## 2018-06-23 NOTE — Progress Notes (Signed)
Dr. Delton Coombes wants patient to start on a Monday for treatment. I spoke with Barnett Applebaum at [facility] and they can not start him until the 22nd.  Dr. Delton Coombes aware and patient will start concurrent chemo/radiation on that day.    I tried to call patient and update him of schedule.  No answer. I left message on his voicemail.

## 2018-06-25 ENCOUNTER — Encounter (HOSPITAL_COMMUNITY): Payer: Self-pay | Admitting: *Deleted

## 2018-06-25 NOTE — Progress Notes (Signed)
I called and spoke with patient via telephone. I advised him of his start date for chemotherapy.  He verbalizes understanding.  I explained to patient that he will get specialized teaching on his chemotherapy medications when he comes in for his appointment.

## 2018-06-28 NOTE — Patient Instructions (Signed)
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  You will be treated with curative intent.  We will be giving you Carboplatin (Paraplatin) and 5-fluorouracil (Adrucil).  These will be given through your port-a-cath.  The Carboplatin will be given on 4 consecutive days.  The 5-fluorouracil will be given via take home pump on day one.  It will be a continuous infusion and you will return after 96 hours to have the pump disconnected.  You will see the doctor regularly throughout treatment.  We monitor your lab work prior to every treatment. The doctor monitors your response to treatment by the way you are feeling, your blood work, and scans periodically.  There will be wait times while you are here for treatment.  It will take about 30 minutes to 1 hour for your lab work to result.  Then there will be wait times while pharmacy mixes your medications.   Pre-medications: You will be given the following premedication prior to each chemotherapy infusion: Aloxi - high powered nausea/vomiting prevention medication used for chemotherapy patients.   Dexamethasone - steroid - given to reduce the risk of you having an allergic type reaction to the chemotherapy. Dexamethasone can cause you to feel energized, nervous/anxious/jittery, make you have trouble sleeping, and/or make you feel hot/flushed in the face/neck and/or look pink/red in the face/neck. These side effects will pass as the medication wears off.    5-Fluorouracil (Adrucil)  About This Drug Fluorouracil is used to treat cancer. It is given in the vein (IV).  Possible Side Effects . Bone marrow suppression. This is a decrease in the number of white blood cells, red blood cells, and platelets. This may raise your risk of infection, make you tired and weak (fatigue), and raise your risk of bleeding . Changes in the tissue of the heart and/or heart attack. Some changes may  happen that can cause your heart to have less ability to pump blood. . Blurred vision or other changes in eyesight . Nausea and throwing up (vomiting) . Diarrhea (loose bowel movements) . Ulcers - sores that may cause pain or bleeding in your digestive tract, which includes your mouth, esophagus, stomach, small/large intestines and rectum . Soreness of the mouth and throat. You may have red areas, white patches, or sores that hurt. . Allergic reactions, including anaphylaxis are rare but may happen in some patients. Signs of allergic reaction to this drug may be swelling of the face, feeling like your tongue or throat are swelling, trouble breathing, rash, itching, fever, chills, feeling dizzy, and/or feeling that your heart is beating in a fast or not normal way. If this happens, do not take another dose of this drug. You should get urgent medical treatment. . Sensitivity to light (photosensitivity). Photosensitivity means that you may become more sensitive to the sun and/or light. You may get a skin rash/reaction if you are in the sun or are exposed to sun lamps and tanning beds. Your eyes may water more, mostly in bright light. . Changes in your nail color, nail loss and/or brittle nail . Darkening of the skin, or changes to the color of your skin and/or veins used for infusion . Rash, dry skin, or itching Note: Not all possible side effects are included above.  Warnings and Precautions . Hand-and-foot syndrome. The palms of your hands or soles of your feet may tingle, become numb, painful, swollen, or red. . Changes in your central  nervous system can happen. The central nervous system is made up of your brain and spinal cord. You could feel extreme tiredness, agitation, confusion, hallucinations (see or hear things that are not there), trouble understanding or speaking, loss of control of your bowels or bladder, eyesight changes, numbness or lack of strength to your arms, legs, face,  or body, or coma. If you start to have any of these symptoms let your doctor know right away. . Side effects of this drug may be unexpectedly severe in some patients Note: Some of the side effects above are very rare. If you have concerns and/or questions, please discuss them with your medical team.  Important Information . This drug may be present in the saliva, tears, sweat, urine, stool, vomit, semen, and vaginal secretions. Talk to your doctor and/or your nurse about the necessary precautions to take during this time.  Treating Side Effects . Manage tiredness by pacing your activities for the day. . Be sure to include periods of rest between energy-draining activities. . To help decrease the risk of infections, wash your hands regularly. . Avoid close contact with people who have a cold, the flu, or other infections. . Take your temperature as your doctor or nurse tells you, and whenever you feel like you may have a fever. . Use a soft toothbrush. Check with your nurse before using dental floss. . Be very careful when using knives or tools. . Use an electric shaver instead of a razor. . If you have a nose bleed, sit with your head tipped slightly forward. Apply pressure by lightly pinching the bridge of your nose between your thumb and forefinger. Call your doctor if you feel dizzy or faint or if the bleeding doesn't stop after 10 to 15 minutes. . Drink plenty of fluids (a minimum of eight glasses per day is recommended). . If you throw up or have loose bowel movements, you should drink more fluids so that you do not become dehydrated (lack of water in the body from losing too much fluid). . To help with nausea and vomiting, eat small, frequent meals instead of three large meals a day. Choose foods and drinks that are at room temperature. Ask your nurse or doctor about other helpful tips and medicine that is available to help, stop, or lessen these symptoms. . If you have diarrhea,  eat low-fiber foods that are high in protein and calories and avoid foods that can irritate your digestive tracts or lead to cramping. . Ask your nurse or doctor about medicine that can lessen or stop your diarrhea. . Mouth care is very important. Your mouth care should consist of routine, gentle cleaning of your teeth or dentures and rinsing your mouth with a mixture of 1/2 teaspoon of salt in 8 ounces of water or 1/2 teaspoon of baking soda in 8 ounces of water. This should be done at least after each meal and at bedtime. . If you have mouth sores, avoid mouthwash that has alcohol. Also avoid alcohol and smoking because they can bother your mouth and throat. Marland Kitchen Keeping your nails moisturized may help with brittleness. . To help with itching, moisturize your skin several times day. . Use sunscreen with SPF 30 or higher when you are outdoors even for a short time. Cover up when you are out in the sun. Wear wide-brimmed hats, long-sleeved shirts, and pants. Keep your neck, chest, and back covered. Wear dark sun glasses when in the sun or bright lights. . If you  get a rash do not put anything on it unless your doctor or nurse says you may. Keep the area around the rash clean and dry. Ask your doctor for medicine if your rash bothers you. Marland Kitchen Keeping your pain under control is important to your well-being. Please tell your doctor or nurse if you are experiencing pain.  Food and Drug Interactions . There are no known interactions of fluorouracil with food. . Check with your doctor or pharmacist about all other prescription medicines and over-the-counter medicines and dietary supplements (vitamins, minerals, herbs and others) you are taking before starting this medicine as there are known drug interactions with 5-fluoroucacil. Also, check with your doctor or pharmacist before starting any new prescription or over-the-counter medicines, or dietary supplements to make sure that there are no  interactions.  When to Call the Doctor Call your doctor or nurse if you have any of these symptoms and/or any new or unusual symptoms: . Fever of 100.4 F (38 C) or higher . Chills . Easy bleeding or bruising . Nose bleed that doesn't stop bleeding after 10-15 minutes . Trouble breathing . Feeling dizzy or lightheaded . Feeling that your heart is beating in a fast or not normal way (palpitations) . Chest pain or symptoms of a heart attack. Most heart attacks involve pain in the center of the chest that lasts more than a few minutes. The pain may go away and come back or it can be constant. It can feel like pressure, squeezing, fullness, or pain. Sometimes pain is felt in one or both arms, the back, neck, jaw, or stomach. If any of these symptoms last 2 minutes, call 911. Marland Kitchen Confusion and/or agitation . Hallucinations . Trouble understanding or speaking . Loss of control of bowels or bladder . Blurry vision or changes in your eyesight . Headache that does not go away . Numbness or lack of strength to your arms, legs, face, or body . Nausea that stops you from eating or drinking and/or is not relieved by prescribed medicines . Throwing up more than 3 times a day . Diarrhea, 4 times in one day or diarrhea with lack of strength or a feeling of being dizzy . Pain in your mouth or throat that makes it hard to eat or drink . Pain along the digestive tract - especially if worse after eating . Blood in your vomit (bright red or coffee-ground) and/or stools (bright red, or black/tarry) . Coughing up blood . Tiredness that interferes with your daily activities . Painful, red, or swollen areas on your hands or feet or around your nails . A new rash or a rash that is not relieved by prescribed medicines . Develop sensitivity to sunlight/light . Numbness and/or tingling of your hands and/or feet . Signs of allergic reaction: swelling of the face, feeling like your tongue or throat are swelling,  trouble breathing, rash, itching, fever, chills, feeling dizzy, and/or feeling that your heart is beating in a fast or not normal way. If this happens, call 911 for emergency care. . If you think you are pregnant or may have impregnated your partner  Reproduction Warnings . Pregnancy warning: This drug may have harmful effects on the unborn baby. Women of child bearing potential should use effective methods of birth control during your cancer treatment and 3 months after treatment. Men with male partners of childbearing potential should use effective methods of birth control during your cancer treatment and for 3 months after your cancer treatment. Let your doctor  know right away if you think you may be pregnant or may have impregnated your partner. . Breastfeeding warning: It is not known if this drug passes into breast milk. For this reason, Women should not breastfeed during treatment because this drug could enter the breast milk and cause harm to a breastfeeding baby. . Fertility warning: In men and women both, this drug may affect your ability to have children in the future. Talk with your doctor or nurse if you plan to have children. Ask for information on sperm or egg banking.  Carboplatin (Paraplatin, CBDCA)  About This Drug Carboplatin is used to treat cancer. It is given in the vein (IV).  Possible Side Effects . Bone marrow suppression. This is a decrease in the number of white blood cells, red blood cells, and platelets. This may raise your risk of infection, make you tired and weak (fatigue), and raise your risk of bleeding. . Nausea and vomiting (throwing up) . Weakness . Changes in your liver function . Changes in your kidney function . Electrolyte changes . Pain Note: Each of the side effects above was reported in 20% or greater of patients treated with carboplatin. Not all possible side effects are included above.  Warnings and Precautions . Severe bone  marrow suppression . Allergic reactions, including anaphylaxis are rare but may happen in some patients. Signs of allergic reaction to this drug may be swelling of the face, feeling like your tongue or throat are swelling, trouble breathing, rash, itching, fever, chills, feeling dizzy, and/or feeling that your heart is beating in a fast or not normal way. If this happens, do not take another dose of this drug. You should get urgent medical treatment. . Severe nausea and vomiting . Effects on the nerves are called peripheral neuropathy. This risk is increased if you are over the age of 60 or if you have received other medicine with risk of peripheral neuropathy. You may feel numbness, tingling, or pain in your hands and feet. It may be hard for you to button your clothes, open jars, or walk as usual. The effect on the nerves may get worse with more doses of the drug. These effects get better in some people after the drug is stopped but it does not get better in all people. Marland Kitchen Blurred vision, loss of vision or other changes in eyesight . Decreased hearing . Skin and tissue irritation including redness, pain, warmth, or swelling at the IV site if the drug leaks out of the vein and into nearby tissue. . Severe changes in your kidney function, which can cause kidney failure . Severe changes in your liver function, which can cause liver failure Note: Some of the side effects above are very rare. If you have concerns and/or questions, please discuss them with your medical team.  Important Information . This drug may be present in the saliva, tears, sweat, urine, stool, vomit, semen, and vaginal secretions. Talk to your doctor and/or your nurse about the necessary precautions to take during this time.  Treating Side Effects . Manage tiredness by pacing your activities for the day. . Be sure to include periods of rest between energy-draining activities. . To decrease the risk of infection, wash  your hands regularly. . Avoid close contact with people who have a cold, the flu, or other infections. . Take your temperature as your doctor or nurse tells you, and whenever you feel like you may have a fever. . To help decrease the risk of bleeding, use  a soft toothbrush. Check with your nurse before using dental floss. . Be very careful when using knives or tools. . Use an electric shaver instead of a razor. . Drink plenty of fluids (a minimum of eight glasses per day is recommended). . If you throw up or have loose bowel movements, you should drink more fluids so that you do not become dehydrated (lack of water in the body from losing too much fluid). . To help with nausea and vomiting, eat small, frequent meals instead of three large meals a day. Choose foods and drinks that are at room temperature. Ask your nurse or doctor about other helpful tips and medicine that is available to help stop or lessen these symptoms. . If you have numbness and tingling in your hands and feet, be careful when cooking, walking, and handling sharp objects and hot liquids. Marland Kitchen Keeping your pain under control is important to your well-being. Please tell your doctor or nurse if you are experiencing pain.  Food and Drug Interactions . There are no known interactions of carboplatin with food. . This drug may interact with other medicines. Tell your doctor and pharmacist about all the prescription and over-the-counter medicines and dietary supplements (vitamins, minerals, herbs and others) that you are taking at this time. Also, check with your doctor or pharmacist before starting any new prescription or over-the-counter medicines, or dietary supplements to make sure that there are no interactions.  When to Call the Doctor Call your doctor or nurse if you have any of these symptoms and/or any new or unusual symptoms: . Fever of 100.4 F (38 C) or higher . Chills . Tiredness that interferes with your daily  activities . Feeling dizzy or lightheaded . Easy bleeding or bruising . Nausea that stops you from eating or drinking and/or is not relieved by prescribed medicines . Throwing up more than 3 times a day . Blurred vision or other changes in eyesight . Decrease in hearing or ringing in the ear . Signs of allergic reaction: swelling of the face, feeling like your tongue or throat are swelling, trouble breathing, rash, itching, fever, chills, feeling dizzy, and/or feeling that your heart is beating in a fast or not normal way. If this happens, call 911 for emergency care. . While you are getting this drug, please tell your nurse right away if you have any pain, redness, or swelling at the site of the IV infusion . Signs of possible liver problems: dark urine, pale bowel movements, bad stomach pain, feeling very tired and weak, unusual itching, or yellowing of the eyes or skin . Decreased urine, or very dark urine . Numbness, tingling, or pain in your hands and feet . Pain that does not go away or is not relieved by prescribed medicine . If you think you may be pregnant  Reproduction Warnings . Pregnancy warning: This drug may have harmful effects on the unborn baby. Women of child bearing potential should use effective methods of birth control during your cancer treatment. Let your doctor know right away if you think you may be pregnant. . Breastfeeding warning: It is not known if this drug passes into breast milk. For this reason, women should not breastfeed during treatment because this drug could enter the breast milk and cause harm to a breastfeeding baby. . Fertility warning: Human fertility studies have not been done with this drug. Talk with your doctor or nurse if you plan to have children. Ask for information on sperm or egg banking.  SELF CARE ACTIVITIES WHILE ON CHEMOTHERAPY:  Hydration Increase your fluid intake 48 hours prior to treatment and drink at least 8 to 12 cups (64  ounces) of water/decaff beverages per day after treatment. You can still have your cup of coffee or soda but these beverages do not count as part of your 8 to 12 cups that you need to drink daily. No alcohol intake.  Medications Continue taking your normal prescription medication as prescribed.  If you start any new herbal or new supplements please let us know first to make sure it is safe.  Mouth Care Have teeth cleaned professionally before starting treatment. Keep dentures and partial plates clean. Use soft toothbrush and do not use mouthwashes that contain alcohol. Biotene is a good mouthwash that is available at most pharmacies or may be ordered by calling [phone]. Use warm salt water gargles (1 teaspoon salt per 1 quart warm water) before and after meals and at bedtime. Or you may rinse with 2 tablespoons of three-percent hydrogen peroxide mixed in eight ounces of water. If you are still having problems with your mouth or sores in your mouth please call the clinic. If you need dental work, please let the doctor know before you go for your appointment so that we can coordinate the best possible time for you in regards to your chemo regimen. You need to also let your dentist know that you are actively taking chemo. We may need to do labs prior to your dental appointment.  Skin Care Always use sunscreen that has not expired and with SPF (Sun Protection Factor) of 50 or higher. Wear hats to protect your head from the sun. Remember to use sunscreen on your hands, ears, face, & feet.  Use good moisturizing lotions such as udder cream, eucerin, or even Vaseline. Some chemotherapies can cause dry skin, color changes in your skin and nails.    . Avoid long, hot showers or baths. . Use gentle, fragrance-free soaps and laundry detergent. . Use moisturizers, preferably creams or ointments rather than lotions because the thicker consistency is better at preventing skin dehydration. Apply the cream or  ointment within 15 minutes of showering. Reapply moisturizer at night, and moisturize your hands every time after you wash them.  Hair Loss (if your doctor says your hair will fall out)  . If your doctor says that your hair is likely to fall out, decide before you begin chemo whether you want to wear a wig. You may want to shop before treatment to match your hair color. . Hats, turbans, and scarves can also camouflage hair loss, although some people prefer to leave their heads uncovered. If you go bare-headed outdoors, be sure to use sunscreen on your scalp. . Cut your hair short. It eases the inconvenience of shedding lots of hair, but it also can reduce the emotional impact of watching your hair fall out. . Don't perm or color your hair during chemotherapy. Those chemical treatments are already damaging to hair and can enhance hair loss. Once your chemo treatments are done and your hair has grown back, it's OK to resume dyeing or perming hair. With chemotherapy, hair loss is almost always temporary. But when it grows back, it may be a different color or texture. In older adults who still had hair color before chemotherapy, the new growth may be completely gray.  Often, new hair is very fine and soft.  Infection Prevention Please wash your hands for at least 30 seconds using warm  soapy water. Handwashing is the #1 way to prevent the spread of germs. Stay away from sick people or people who are getting over a cold. If you develop respiratory systems such as green/yellow mucus production or productive cough or persistent cough let us know and we will see if you need an antibiotic. It is a good idea to keep a pair of gloves on when going into grocery stores/[employer] to decrease your risk of coming into contact with germs on the carts, etc. Carry alcohol hand gel with you at all times and use it frequently if out in public. If your temperature reaches 100.5 or higher please call the clinic and let us know.   If it is after hours or on the weekend please go to the ER if your temperature is over 100.5.  Please have your own personal thermometer at home to use.    Sex and bodily fluids If you are going to have sex, a condom must be used to protect the person that isn't taking chemotherapy. Chemo can decrease your libido (sex drive). For a few days after chemotherapy, chemotherapy can be excreted through your bodily fluids.  When using the toilet please close the lid and flush the toilet twice.  Do this for a few day after you have had chemotherapy.   Effects of chemotherapy on your sex life Some changes are simple and won't last long. They won't affect your sex life permanently. Sometimes you may feel: . too tired . not strong enough to be very active . sick or sore  . not in the mood . anxious or low Your anxiety might not seem related to sex. For example, you may be worried about the cancer and how your treatment is going. Or you may be worried about money, or about how you family are coping with your illness. These things can cause stress, which can affect your interest in sex. It's important to talk to your partner about how you feel. Remember - the changes to your sex life don't usually last long. There's usually no medical reason to stop having sex during chemo. The drugs won't have any long term physical effects on your performance or enjoyment of sex. Cancer can't be passed on to your partner during sex  Contraception It's important to use reliable contraception during treatment. Avoid getting pregnant while you or your partner are having chemotherapy. This is because the drugs may harm the baby. Sometimes chemotherapy drugs can leave a man or woman infertile.  This means you would not be able to have children in the future. You might want to talk to someone about permanent infertility. It can be very difficult to learn that you may no longer be able to have children. Some people find counselling  helpful. There might be ways to preserve your fertility, although this is easier for men than for women. You may want to speak to a fertility expert. You can talk about sperm banking or harvesting your eggs. You can also ask about other fertility options, such as donor eggs. If you have or have had breast cancer, your doctor might advise you not to take the contraceptive pill. This is because the hormones in it might affect the cancer.  It is not known for sure whether or not chemotherapy drugs can be passed on through semen or secretions from the vagina. Because of this some doctors advise people to use a barrier method if you have sex during treatment. This applies to vaginal, anal  or oral sex. Generally, doctors advise a barrier method only for the time you are actually having the treatment and for about a week after your treatment. Advice like this can be worrying, but this does not mean that you have to avoid being intimate with your partner. You can still have close contact with your partner and continue to enjoy sex.  Animals If you have cats or birds we just ask that you not change the litter or change the cage.  Please have someone else do this for you while you are on chemotherapy.   Food Safety During and After Cancer Treatment Food safety is important for people both during and after cancer treatment. Cancer and cancer treatments, such as chemotherapy, radiation therapy, and stem cell/bone marrow transplantation, often weaken the immune system. This makes it harder for your body to protect itself from foodborne illness, also called food poisoning. Foodborne illness is caused by eating food that contains harmful bacteria, parasites, or viruses.  Foods to avoid Some foods have a higher risk of becoming tainted with bacteria. These include: Marland Kitchen Unwashed fresh fruit and vegetables, especially leafy vegetables that can hide dirt and other contaminants . Raw sprouts, such as alfalfa  sprouts . Raw or undercooked beef, especially ground beef, or other raw or undercooked meat and poultry . Fatty, fried, or spicy foods immediately before or after treatment.  These can sit heavy on your stomach and make you feel nauseous. . Raw or undercooked shellfish, such as oysters. . Sushi and sashimi, which often contain raw fish.  . Unpasteurized beverages, such as unpasteurized fruit juices, raw milk, raw yogurt, or cider . Undercooked eggs, such as soft boiled, over easy, and poached; raw, unpasteurized eggs; or foods made with raw egg, such as homemade raw cookie dough and homemade mayonnaise Simple steps for food safety Shop smart. . Do not buy food stored or displayed in an unclean area. . Do not buy bruised or damaged fruits or vegetables. . Do not buy cans that have cracks, dents, or bulges. . Pick up foods that can spoil at the end of your shopping trip and store them in a cooler on the way home. Prepare and clean up foods carefully. . Rinse all fresh fruits and vegetables under running water, and dry them with a clean towel or paper towel. . Clean the top of cans before opening them. . After preparing food, wash your hands for 20 seconds with hot water and soap. Pay special attention to areas between fingers and under nails. . Clean your utensils and dishes with hot water and soap. Marland Kitchen Disinfect your kitchen and cutting boards using 1 teaspoon of liquid, unscented bleach mixed into 1 quart of water.   Dispose of old food. . Eat canned and packaged food before its expiration date (the "use by" or "best before" date). . Consume refrigerated leftovers within 3 to 4 days. After that time, throw out the food. Even if the food does not smell or look spoiled, it still may be unsafe. Some bacteria, such as Listeria, can grow even on foods stored in the refrigerator if they are kept for too long. Take precautions when eating out. . At restaurants, avoid buffets and salad bars where food  sits out for a long time and comes in contact with many people. Food can become contaminated when someone with a virus, often a norovirus, or another "bug" handles it. . Put any leftover food in a "to-go" container yourself, rather than having the  server do it. And, refrigerate leftovers as soon as you get home. . Choose restaurants that are clean and that are willing to prepare your food as you order it cooked.  MEDICATIONS:                                                                                                                                                               Compazine/Prochlorperazine 10mg  tablet. Take 1 tablet every 6 hours as needed for nausea/vomiting. (#2 nausea med to take, this can make you sleepy)   EMLA cream. Apply a quarter size amount to port site 1 hour prior to chemo. Do not rub in. Cover with plastic wrap.   Over-the-Counter Meds:  Miralax 1 capful in 8 oz of fluid daily. May increase to two times a day if needed. This is a stool softener. If this doesn't work proceed you can add:  Senokot S-start with 1 tablet two times a day and increase to 4 tablets two times a day if needed. (total of 8 tablets in a 24 hour period). This is a stimulant laxative.   Call us if this does not help your bowels move.   Imodium 2mg  capsule. Take 2 capsules after the 1st loose stool and then 1 capsule every 2 hours until you go a total of 12 hours without having a loose stool. Call the [facility] if loose stools continue. If diarrhea occurs @ bedtime, take 2 capsules @ bedtime. Then take 2 capsules every 4 hours until morning. Call [facility].    Diarrhea Sheet   If you are having loose stools/diarrhea, please purchase Imodium and begin taking as outlined:  At the first sign of poorly formed or loose stools you should begin taking Imodium(loperamide) 2 mg capsules.  Take two caplets (4mg ) followed by one caplet (2mg ) every 2 hours until you have had no diarrhea for 12  hours.  During the night take two caplets (4mg ) at bedtime and continue every 4 hours during the night until the morning.  Stop taking Imodium only after there is no sign of diarrhea for 12 hours.    Always call the [facility] if you are having loose stools/diarrhea that you can't get under control.  Loose stools/diarrhea leads to dehydration (loss of water) in your body.  We have other options of trying to get the loose stools/diarrhea to stopped but you must let us know!    Constipation Sheet *Miralax in 8 oz of fluid daily.  May increase to two times a day if needed.  This is a stool softener.  If this not enough to keep your bowel regular:  You can add:  *Senokot S, start with one tablet twice a day and can increase to 4 tablets twice a day if needed.  This is a stimulant laxative.   Sometimes when you take pain medication you need BOTH a medicine to keep your stool soft and a medicine to help your bowel push it out!  Please call if the above does not work for you.   Do not go more than 2 days without a bowel movement.  It is very important that you do not become constipated.  It will make you feel sick to your stomach (nausea) and can cause abdominal pain and vomiting.    Nausea Sheet   Compazine/Prochlorperazine 10mg  tablet. Take 1 tablet every 6 hours as needed for nausea/vomiting. (this can make you sleepy)  If you are having persistent nausea (nausea that does not stop) please call the [facility] and let us know the amount of nausea that you are experiencing.  If you begin to vomit, you need to call the [facility] and if it is the weekend and you have vomited more than one time and cant get it to stop-go to the Emergency Room.  Persistent nausea/vomiting can lead to dehydration (loss of fluid in your body) and will make you feel terrible.   Ice chips, sips of clear liquids, foods that are @ room temperature, crackers, and toast tend to be better  tolerated.     SYMPTOMS TO REPORT AS SOON AS POSSIBLE AFTER TREATMENT:  FEVER GREATER THAN 100.5 F  CHILLS WITH OR WITHOUT FEVER  NAUSEA AND VOMITING THAT IS NOT CONTROLLED WITH YOUR NAUSEA MEDICATION  UNUSUAL SHORTNESS OF BREATH  UNUSUAL BRUISING OR BLEEDING  TENDERNESS IN MOUTH AND THROAT WITH OR WITHOUT PRESENCE OF ULCERS  URINARY PROBLEMS  BOWEL PROBLEMS  UNUSUAL RASH    Wear comfortable clothing and clothing appropriate for easy access to any Portacath or PICC line. Let us know if there is anything that we can do to make your therapy better!    What to do if you need assistance after hours or on the weekends: CALL [phone].  HOLD on the line, do not hang up.  You will hear multiple messages but at the end you will be connected with a nurse triage line.  They will contact the doctor if necessary.  Most of the time they will be able to assist you.  Do not call the hospital operator.      I have been informed and understand all of the instructions given to me and have received a copy. I have been instructed to call the clinic [phone] or my family physician as soon as possible for continued medical care, if indicated. I do not have any more questions at this time but understand that I may call the [facility] or the Patient Navigator at [phone] during office hours should I have questions or need assistance in obtaining follow-up care.

## 2018-06-29 ENCOUNTER — Ambulatory Visit (INDEPENDENT_AMBULATORY_CARE_PROVIDER_SITE_OTHER): Payer: Medicare Other | Admitting: General Surgery

## 2018-06-29 ENCOUNTER — Encounter: Payer: Self-pay | Admitting: General Surgery

## 2018-06-29 VITALS — BP 141/77 | HR 76 | Temp 99.8°F | Ht 68.0 in | Wt 176.0 lb

## 2018-06-29 DIAGNOSIS — C099 Malignant neoplasm of tonsil, unspecified: Secondary | ICD-10-CM | POA: Diagnosis not present

## 2018-06-29 MED ORDER — LIDOCAINE-PRILOCAINE 2.5-2.5 % EX CREA: TOPICAL_CREAM | CUTANEOUS | 3 refills | Status: DC

## 2018-06-29 MED ORDER — PROCHLORPERAZINE MALEATE 10 MG PO TABS
10.0000 mg | ORAL_TABLET | Freq: Four times a day (QID) | ORAL | 1 refills | Status: DC | PRN
Start: 2018-06-29 — End: 2019-03-08

## 2018-06-29 NOTE — H&P (Signed)
[name]; [id number]; March 04, 1951   HPI Patient is a 67 year old white male who was referred to my care by Dr. Ellin Saba for Port-A-Cath placement.  He has tonsillar carcinoma and needs a Port-A-Cath placed for central venous access.  He currently has no pain.  He denies any fever or chills. Past Medical History:  Diagnosis Date  . Complication of anesthesia    I think slow to awaken with colonoscopy- 10-15 years ago - recorded 06/14/18  . HOH (hard of hearing)   . Hyperlipidemia   . Mass of oral cavity 05/12/2018  . Positive FIT (fecal immunochemical test) 05/12/2018  . PTSD (post-traumatic stress disorder)    has not been offically diagnosised  . Tonsillar cancer (HCC) 06/02/2018    Past Surgical History:  Procedure Laterality Date  . APPENDECTOMY  1969  . COLONOSCOPY W/ POLYPECTOMY    . FINGER FRACTURE SURGERY Left   . MULTIPLE EXTRACTIONS WITH ALVEOLOPLASTY N/A 06/15/2018   Procedure: Extraction of tooth #'s 2,12,13,18,20,and 21 with alveoloplasty and gross debridement of remaining teeth;  Surgeon: Charlynne Pander, DDS;  Location: [facility] OR;  Service: Oral Surgery;  Laterality: N/A;    Family History  Problem Relation Age of Onset  . Diabetes Father     Current Outpatient Medications on File Prior to Visit  Medication Sig Dispense Refill  . ibuprofen (ADVIL,MOTRIN) 200 MG tablet Take 400 mg by mouth every 6 (six) hours as needed for moderate pain.     Marland Kitchen oxyCODONE-acetaminophen (PERCOCET) 5-325 MG tablet Take one tablet by mouth every 4 hours as needed for pain. 30 tablet 0  . sodium fluoride (FLUORISHIELD) 1.1 % GEL dental gel Instill one drop of gel per tooth space of fluoride tray. Place over teeth for 5 minutes. Remove. Spit out excess. Repeat nightly. 120 mL prn   No current facility-administered medications on file prior to visit.     No Known Allergies  Social History   Substance and Sexual Activity  Alcohol Use Yes  . Alcohol/week: 7.2 oz  . Types: 12 Cans of  beer per week    Social History   Tobacco Use  Smoking Status Former Smoker  . Years: 15.00  . Types: Cigarettes  . Last attempt to quit: 2000  . Years since quitting: 19.5  Smokeless Tobacco Never Used    Review of Systems  Constitutional: Negative.   HENT: Negative.   Eyes: Negative.   Respiratory: Negative.   Cardiovascular: Negative.   Gastrointestinal: Negative.   Genitourinary: Negative.   Musculoskeletal: Negative.   Skin: Negative.   Neurological: Negative.   Endo/Heme/Allergies: Negative.   Psychiatric/Behavioral: Negative.     Objective   Vitals:   06/29/18 1424  BP: (!) 141/77  Pulse: 76  Temp: 99.8 F (37.7 C)    Physical Exam  Constitutional: He is oriented to person, place, and time. He appears well-developed and well-nourished. No distress.  HENT:  Head: Normocephalic and atraumatic.  Cardiovascular: Normal rate, regular rhythm and normal heart sounds. Exam reveals no gallop and no friction rub.  No murmur heard. Pulmonary/Chest: Effort normal and breath sounds normal. No stridor. No respiratory distress. He has no wheezes. He has no rales.  Neurological: He is alert and oriented to person, place, and time.  Skin: Skin is warm and dry.  Vitals reviewed.  Dr. Marice Potter notes reviewed Assessment  Tonsillar carcinoma, need for central venous access Plan   Patient is scheduled for Port-A-Cath insertion on 07/02/2018.  The risks and benefits of the  procedure including bleeding, infection, and pneumothorax were fully explained to the patient, who gave informed consent.

## 2018-06-29 NOTE — Patient Instructions (Signed)
Implanted Port Insertion  Implanted port insertion is a procedure to put in a port and catheter. The port is a device with an injectable disk that can be accessed by your health care provider. The port is connected to a vein in the chest or neck by a small flexible tube (catheter). There are different types of ports. The implanted port may be used as a long-term IV access for:  · Medicines, such as chemotherapy.  · Fluids.  · Liquid nutrition, such as total parenteral nutrition (TPN).  · Blood samples.    Having a port means that your health care provider will not need to use the veins in your arms for these procedures.  Tell a health care provider about:  · Any allergies you have.  · All medicines you are taking, especially blood thinners, as well as any vitamins, herbs, eye drops, creams, over-the-counter medicines, and steroids.  · Any problems you or family members have had with anesthetic medicines.  · Any blood disorders you have.  · Any surgeries you have had.  · Any medical conditions you have, including diabetes or kidney problems.  · Whether you are pregnant or may be pregnant.  What are the risks?  Generally, this is a safe procedure. However, problems may occur, including:  · Allergic reactions to medicines or dyes.  · Damage to other structures or organs.  · Infection.  · Damage to the blood vessel, bruising, or bleeding at the puncture site.  · Blood clot.  · Breakdown of the skin over the port.  · A collection of air in the chest that can cause one of the lungs to collapse (pneumothorax). This is rare.    What happens before the procedure?  Staying hydrated  Follow instructions from your health care provider about hydration, which may include:  · Up to 2 hours before the procedure - you may continue to drink clear liquids, such as water, clear fruit juice, black coffee, and plain tea.    Eating and drinking restrictions  · Follow instructions from your health care provider about eating and drinking,  which may include:  ? 8 hours before the procedure - stop eating heavy meals or foods such as meat, fried foods, or fatty foods.  ? 6 hours before the procedure - stop eating light meals or foods, such as toast or cereal.  ? 6 hours before the procedure - stop drinking milk or drinks that contain milk.  ? 2 hours before the procedure - stop drinking clear liquids.  Medicines  · Ask your health care provider about:  ? Changing or stopping your regular medicines. This is especially important if you are taking diabetes medicines or blood thinners.  ? Taking medicines such as aspirin and ibuprofen. These medicines can thin your blood. Do not take these medicines before your procedure if your health care provider instructs you not to.  · You may be given antibiotic medicine to help prevent infection.  General instructions  · Plan to have someone take you home from the hospital or clinic.  · If you will be going home right after the procedure, plan to have someone with you for 24 hours.  · You may have blood tests.  · You may be asked to shower with a germ-killing soap.  What happens during the procedure?  · To lower your risk of infection:  ? Your health care team will wash or sanitize their hands.  ? Your skin will be washed with   soap.  ? Hair may be removed from the surgical area.  · An IV tube will be inserted into one of your veins.  · You will be given one or more of the following:  ? A medicine to help you relax (sedative).  ? A medicine to numb the area (local anesthetic).  · Two small cuts (incisions) will be made to insert the port.  ? One incision will be made in your neck to get access to the vein where the catheter will lie.  ? The other incision will be made in the upper chest. This is where the port will lie.  · The procedure may be done using continuous X-ray (fluoroscopy) or other imaging tools for guidance.  · The port and catheter will be placed. There may be a small, raised area where the port  is.  · The port will be flushed with a salt solution (saline), and blood will be drawn to make sure that it is working correctly.  · The incisions will be closed.  · Bandages (dressings) may be placed over the incisions.  The procedure may vary among health care providers and hospitals.  What happens after the procedure?  · Your blood pressure, heart rate, breathing rate, and blood oxygen level will be monitored until the medicines you were given have worn off.  · Do not drive for 24 hours if you were given a sedative.  · You will be given a manufacturer's information card for the type of port that you have. Keep this with you.  · Your port will need to be flushed and checked as told by your health care provider, usually every few weeks.  · A chest X-ray will be done to:  ? Check the placement of the port.  ? Make sure there is no injury to your lung.  Summary  · Implanted port insertion is a procedure to put in a port and catheter.  · The implanted port is used as a long-term IV access.  · The port will need to be flushed and checked as told by your health care provider, usually every few weeks.  · Keep your manufacturer's information card with you at all times.  This information is not intended to replace advice given to you by your health care provider. Make sure you discuss any questions you have with your health care provider.  Document Released: 09/28/2013 Document Revised: 10/29/2016 Document Reviewed: 10/29/2016  Elsevier Interactive Patient Education © 2017 [employer]

## 2018-06-29 NOTE — Progress Notes (Addendum)
[name]; [id number]; March 04, 1951   HPI Patient is a 67 year old white male who was referred to my care by Dr. Ellin Saba for Port-A-Cath placement.  He has tonsillar carcinoma and needs a Port-A-Cath placed for central venous access.  He currently has no pain.  He denies any fever or chills. Past Medical History:  Diagnosis Date  . Complication of anesthesia    I think slow to awaken with colonoscopy- 10-15 years ago - recorded 06/14/18  . HOH (hard of hearing)   . Hyperlipidemia   . Mass of oral cavity 05/12/2018  . Positive FIT (fecal immunochemical test) 05/12/2018  . PTSD (post-traumatic stress disorder)    has not been offically diagnosised  . Tonsillar cancer (HCC) 06/02/2018    Past Surgical History:  Procedure Laterality Date  . APPENDECTOMY  1969  . COLONOSCOPY W/ POLYPECTOMY    . FINGER FRACTURE SURGERY Left   . MULTIPLE EXTRACTIONS WITH ALVEOLOPLASTY N/A 06/15/2018   Procedure: Extraction of tooth #'s 2,12,13,18,20,and 21 with alveoloplasty and gross debridement of remaining teeth;  Surgeon: Charlynne Pander, DDS;  Location: [facility] OR;  Service: Oral Surgery;  Laterality: N/A;    Family History  Problem Relation Age of Onset  . Diabetes Father     Current Outpatient Medications on File Prior to Visit  Medication Sig Dispense Refill  . ibuprofen (ADVIL,MOTRIN) 200 MG tablet Take 400 mg by mouth every 6 (six) hours as needed for moderate pain.     Marland Kitchen oxyCODONE-acetaminophen (PERCOCET) 5-325 MG tablet Take one tablet by mouth every 4 hours as needed for pain. 30 tablet 0  . sodium fluoride (FLUORISHIELD) 1.1 % GEL dental gel Instill one drop of gel per tooth space of fluoride tray. Place over teeth for 5 minutes. Remove. Spit out excess. Repeat nightly. 120 mL prn   No current facility-administered medications on file prior to visit.     No Known Allergies  Social History   Substance and Sexual Activity  Alcohol Use Yes  . Alcohol/week: 7.2 oz  . Types: 12 Cans of  beer per week    Social History   Tobacco Use  Smoking Status Former Smoker  . Years: 15.00  . Types: Cigarettes  . Last attempt to quit: 2000  . Years since quitting: 19.5  Smokeless Tobacco Never Used    Review of Systems  Constitutional: Negative.   HENT: Negative.   Eyes: Negative.   Respiratory: Negative.   Cardiovascular: Negative.   Gastrointestinal: Negative.   Genitourinary: Negative.   Musculoskeletal: Negative.   Skin: Negative.   Neurological: Negative.   Endo/Heme/Allergies: Negative.   Psychiatric/Behavioral: Negative.     Objective   Vitals:   06/29/18 1424  BP: (!) 141/77  Pulse: 76  Temp: 99.8 F (37.7 C)    Physical Exam  Constitutional: He is oriented to person, place, and time. He appears well-developed and well-nourished. No distress.  HENT:  Head: Normocephalic and atraumatic.  Cardiovascular: Normal rate, regular rhythm and normal heart sounds. Exam reveals no gallop and no friction rub.  No murmur heard. Pulmonary/Chest: Effort normal and breath sounds normal. No stridor. No respiratory distress. He has no wheezes. He has no rales.  Neurological: He is alert and oriented to person, place, and time.  Skin: Skin is warm and dry.  Vitals reviewed.  Dr. Marice Potter notes reviewed Assessment  Tonsillar carcinoma, need for central venous access Plan   Patient is scheduled for Port-A-Cath insertion on 07/02/2018.  The risks and benefits of the  procedure including bleeding, infection, and pneumothorax were fully explained to the patient, who gave informed consent.

## 2018-06-30 ENCOUNTER — Encounter (HOSPITAL_COMMUNITY)
Admission: RE | Admit: 2018-06-30 | Discharge: 2018-06-30 | Disposition: A | Discharge status: Home / Self Care | Payer: Medicare Other | Source: Ambulatory Visit | Attending: General Surgery | Admitting: General Surgery

## 2018-06-30 ENCOUNTER — Encounter (HOSPITAL_COMMUNITY): Payer: Self-pay

## 2018-07-02 ENCOUNTER — Encounter (HOSPITAL_COMMUNITY): Payer: Self-pay

## 2018-07-02 ENCOUNTER — Ambulatory Visit (HOSPITAL_COMMUNITY): Payer: Medicare Other | Admitting: Anesthesiology

## 2018-07-02 ENCOUNTER — Encounter (HOSPITAL_COMMUNITY): Payer: Self-pay | Admitting: *Deleted

## 2018-07-02 ENCOUNTER — Ambulatory Visit (HOSPITAL_COMMUNITY)
Admission: RE | Admit: 2018-07-02 | Discharge: 2018-07-02 | Disposition: A | Discharge status: Home / Self Care | Payer: Medicare Other | Source: Ambulatory Visit | Attending: General Surgery | Admitting: General Surgery

## 2018-07-02 ENCOUNTER — Ambulatory Visit (HOSPITAL_COMMUNITY): Payer: Medicare Other | Admitting: Hematology

## 2018-07-02 ENCOUNTER — Ambulatory Visit (HOSPITAL_COMMUNITY): Payer: Medicare Other

## 2018-07-02 ENCOUNTER — Encounter (HOSPITAL_COMMUNITY)
Admission: RE | Disposition: A | Discharge status: Home / Self Care | Payer: Self-pay | Source: Ambulatory Visit | Attending: General Surgery

## 2018-07-02 DIAGNOSIS — E785 Hyperlipidemia, unspecified: Secondary | ICD-10-CM | POA: Insufficient documentation

## 2018-07-02 DIAGNOSIS — C099 Malignant neoplasm of tonsil, unspecified: Secondary | ICD-10-CM

## 2018-07-02 DIAGNOSIS — Z9889 Other specified postprocedural states: Secondary | ICD-10-CM | POA: Diagnosis not present

## 2018-07-02 DIAGNOSIS — Z87891 Personal history of nicotine dependence: Secondary | ICD-10-CM | POA: Diagnosis not present

## 2018-07-02 DIAGNOSIS — Z95828 Presence of other vascular implants and grafts: Secondary | ICD-10-CM

## 2018-07-02 HISTORY — PX: PORTACATH PLACEMENT: SHX2246

## 2018-07-02 SURGERY — INSERTION, TUNNELED CENTRAL VENOUS DEVICE, WITH PORT
Anesthesia: Monitor Anesthesia Care | Site: Chest | Laterality: Left

## 2018-07-02 MED ORDER — HYDROMORPHONE HCL 1 MG/ML IJ SOLN: 0.2500 mg | INTRAMUSCULAR | Status: DC | PRN

## 2018-07-02 MED ORDER — KETOROLAC TROMETHAMINE 30 MG/ML IJ SOLN
INTRAMUSCULAR | Status: AC
  Filled 2018-07-02: qty 1

## 2018-07-02 MED ORDER — MEPERIDINE HCL 50 MG/ML IJ SOLN: 6.2500 mg | INTRAMUSCULAR | Status: DC | PRN

## 2018-07-02 MED ORDER — CHLORHEXIDINE GLUCONATE CLOTH 2 % EX PADS: 6.0000 | MEDICATED_PAD | Freq: Once | CUTANEOUS | Status: DC

## 2018-07-02 MED ORDER — MIDAZOLAM HCL 5 MG/5ML IJ SOLN
INTRAMUSCULAR | Status: DC | PRN
  Administered 2018-07-02: 2 mg via INTRAVENOUS

## 2018-07-02 MED ORDER — CEFAZOLIN SODIUM-DEXTROSE 2-4 GM/100ML-% IV SOLN
2.0000 g | INTRAVENOUS | Status: AC
  Administered 2018-07-02: 2 g via INTRAVENOUS
  Filled 2018-07-02: qty 100

## 2018-07-02 MED ORDER — LIDOCAINE HCL (PF) 1 % IJ SOLN
INTRAMUSCULAR | Status: DC | PRN
  Administered 2018-07-02: 6 mL

## 2018-07-02 MED ORDER — HEPARIN SOD (PORK) LOCK FLUSH 100 UNIT/ML IV SOLN
INTRAVENOUS | Status: DC | PRN
  Administered 2018-07-02: 500 [IU] via INTRAVENOUS

## 2018-07-02 MED ORDER — PROMETHAZINE HCL 25 MG/ML IJ SOLN: 6.2500 mg | INTRAMUSCULAR | Status: DC | PRN

## 2018-07-02 MED ORDER — PROPOFOL 10 MG/ML IV BOLUS
INTRAVENOUS | Status: AC
  Filled 2018-07-02: qty 40

## 2018-07-02 MED ORDER — GLYCOPYRROLATE 0.2 MG/ML IJ SOLN
INTRAMUSCULAR | Status: AC
  Filled 2018-07-02: qty 5

## 2018-07-02 MED ORDER — HYDROCODONE-ACETAMINOPHEN 7.5-325 MG PO TABS: 1.0000 | ORAL_TABLET | Freq: Once | ORAL | Status: DC | PRN

## 2018-07-02 MED ORDER — PROPOFOL 500 MG/50ML IV EMUL
INTRAVENOUS | Status: DC | PRN
  Administered 2018-07-02: 150 ug/kg/min via INTRAVENOUS

## 2018-07-02 MED ORDER — PROPOFOL 10 MG/ML IV BOLUS
INTRAVENOUS | Status: DC | PRN
  Administered 2018-07-02 (×2): 20 mg via INTRAVENOUS

## 2018-07-02 MED ORDER — LACTATED RINGERS IV SOLN: INTRAVENOUS | Status: DC

## 2018-07-02 MED ORDER — LACTATED RINGERS IV SOLN
INTRAVENOUS | Status: DC
  Administered 2018-07-02: 1000 mL via INTRAVENOUS

## 2018-07-02 MED ORDER — SODIUM CHLORIDE 0.9 % IV SOLN
INTRAVENOUS | Status: AC | PRN
  Administered 2018-07-02: 500 mL via INTRAMUSCULAR

## 2018-07-02 MED ORDER — HEPARIN SOD (PORK) LOCK FLUSH 100 UNIT/ML IV SOLN
INTRAVENOUS | Status: AC
  Filled 2018-07-02: qty 5

## 2018-07-02 MED ORDER — LIDOCAINE HCL (PF) 1 % IJ SOLN
INTRAMUSCULAR | Status: AC
  Filled 2018-07-02: qty 30

## 2018-07-02 MED ORDER — KETOROLAC TROMETHAMINE 30 MG/ML IJ SOLN
30.0000 mg | Freq: Once | INTRAMUSCULAR | Status: AC
Start: 2018-07-02 — End: 2018-07-02
  Administered 2018-07-02: 30 mg via INTRAVENOUS

## 2018-07-02 MED ORDER — MIDAZOLAM HCL 2 MG/2ML IJ SOLN
INTRAMUSCULAR | Status: AC
  Filled 2018-07-02: qty 2

## 2018-07-02 MED ORDER — SODIUM CHLORIDE 0.9 % IJ SOLN
INTRAMUSCULAR | Status: AC
  Filled 2018-07-02: qty 20

## 2018-07-02 MED ORDER — LIDOCAINE HCL (PF) 1 % IJ SOLN
INTRAMUSCULAR | Status: AC
  Filled 2018-07-02: qty 5

## 2018-07-02 SURGICAL SUPPLY — 29 items
BAG DECANTER FOR FLEXI CONT (MISCELLANEOUS) ×3 IMPLANT
CHLORAPREP W/TINT 10.5 ML (MISCELLANEOUS) ×3 IMPLANT
CLOTH BEACON ORANGE TIMEOUT ST (SAFETY) ×3 IMPLANT
COVER LIGHT HANDLE STERIS (MISCELLANEOUS) ×6 IMPLANT
DECANTER SPIKE VIAL GLASS SM (MISCELLANEOUS) ×3 IMPLANT
DERMABOND ADVANCED (GAUZE/BANDAGES/DRESSINGS) ×2
DERMABOND ADVANCED .7 DNX12 (GAUZE/BANDAGES/DRESSINGS) ×1 IMPLANT
DRAPE C-ARM FOLDED MOBILE STRL (DRAPES) ×3 IMPLANT
ELECT REM PT RETURN 9FT ADLT (ELECTROSURGICAL) ×3
ELECTRODE REM PT RTRN 9FT ADLT (ELECTROSURGICAL) ×1 IMPLANT
GLOVE BIO SURGEON STRL SZ7 (GLOVE) ×3 IMPLANT
GLOVE BIOGEL PI IND STRL 7.0 (GLOVE) ×2 IMPLANT
GLOVE BIOGEL PI INDICATOR 7.0 (GLOVE) ×4
GLOVE SURG SS PI 7.5 STRL IVOR (GLOVE) ×3 IMPLANT
GOWN STRL REUS W/TWL LRG LVL3 (GOWN DISPOSABLE) ×6 IMPLANT
IV NS 500ML (IV SOLUTION) ×2
IV NS 500ML BAXH (IV SOLUTION) ×1 IMPLANT
KIT PORT POWER 8FR ISP MRI (Port) ×3 IMPLANT
KIT TURNOVER KIT A (KITS) ×3 IMPLANT
NEEDLE HYPO 25X1 1.5 SAFETY (NEEDLE) ×3 IMPLANT
PACK MINOR (CUSTOM PROCEDURE TRAY) ×3 IMPLANT
PAD ARMBOARD 7.5X6 YLW CONV (MISCELLANEOUS) ×3 IMPLANT
SET BASIN LINEN APH (SET/KITS/TRAYS/PACK) ×3 IMPLANT
SUT MNCRL AB 4-0 PS2 18 (SUTURE) ×3 IMPLANT
SUT VIC AB 3-0 SH 27 (SUTURE) ×2
SUT VIC AB 3-0 SH 27X BRD (SUTURE) ×1 IMPLANT
SYR 20CC LL (SYRINGE) ×3 IMPLANT
SYR 5ML LL (SYRINGE) ×3 IMPLANT
SYR CONTROL 10ML LL (SYRINGE) ×3 IMPLANT

## 2018-07-02 NOTE — Anesthesia Preprocedure Evaluation (Signed)
Anesthesia Evaluation  Patient identified by MRN, date of birth, ID band Patient awake    Reviewed: Allergy & Precautions, H&P , NPO status , Patient's Chart, lab work & pertinent test results, reviewed documented beta blocker date and time   Airway Mallampati: II  TM Distance: >3 FB Neck ROM: full    Dental no notable dental hx. (+) Missing   Pulmonary neg pulmonary ROS, former smoker,    Pulmonary exam normal breath sounds clear to auscultation       Cardiovascular Exercise Tolerance: Good negative cardio ROS   Rhythm:regular Rate:Normal     Neuro/Psych PSYCHIATRIC DISORDERS Anxiety negative neurological ROS  negative psych ROS   GI/Hepatic negative GI ROS, Neg liver ROS,   Endo/Other  negative endocrine ROS  Renal/GU negative Renal ROS  negative genitourinary   Musculoskeletal   Abdominal   Peds  Hematology negative hematology ROS (+)   Anesthesia Other Findings Dx w/ Tonsilar Cancer- oral cavity mass  Reproductive/Obstetrics negative OB ROS                             Anesthesia Physical Anesthesia Plan  ASA: III  Anesthesia Plan: MAC   Post-op Pain Management:    Induction:   PONV Risk Score and Plan:   Airway Management Planned:   Additional Equipment:   Intra-op Plan:   Post-operative Plan:   Informed Consent: I have reviewed the patients History and Physical, chart, labs and discussed the procedure including the risks, benefits and alternatives for the proposed anesthesia with the patient or authorized representative who has indicated his/her understanding and acceptance.   Dental Advisory Given  Plan Discussed with: CRNA and Anesthesiologist  Anesthesia Plan Comments:         Anesthesia Quick Evaluation

## 2018-07-02 NOTE — Anesthesia Postprocedure Evaluation (Signed)
Anesthesia Post Note Late Entry for 0917  Patient: [name]  Procedure(s) Performed: INSERTION PORT-A-CATH (Left Chest)  Patient location during evaluation: PACU Anesthesia Type: MAC Level of consciousness: awake and alert, oriented and patient cooperative Vital Signs Assessment: post-procedure vital signs reviewed and stable Respiratory status: spontaneous breathing Cardiovascular status: stable Postop Assessment: no apparent nausea or vomiting Anesthetic complications: no     Last Vitals:  Vitals:   07/02/18 0900 07/02/18 0922  BP: 129/72 136/61  Pulse: 66 (!) 59  Resp: 20 18  Temp:  36.5 C  SpO2: 100% 97%    Last Pain:  Vitals:   07/02/18 0922  TempSrc: Oral  PainSc: 0-No pain                 ADAMS, AMY A

## 2018-07-02 NOTE — Transfer of Care (Signed)
Immediate Anesthesia Transfer of Care Note  Patient: [name]  Procedure(s) Performed: INSERTION PORT-A-CATH (Left Chest)  Patient Location: PACU  Anesthesia Type:MAC  Level of Consciousness: awake, alert  and oriented  Airway & Oxygen Therapy: Patient Spontanous Breathing  Post-op Assessment: Report given to RN  Post vital signs: Reviewed and stable  Last Vitals:  Vitals Value Taken Time  BP 124/59 07/02/2018  8:38 AM  Temp    Pulse 58 07/02/2018  8:39 AM  Resp 14 07/02/2018  8:39 AM  SpO2 97 % 07/02/2018  8:39 AM  Vitals shown include unvalidated device data.  Last Pain:  Vitals:   07/02/18 0714  TempSrc: Oral  PainSc: 0-No pain      Patients Stated Pain Goal: 8 (85/63/14 9702)  Complications: No apparent anesthesia complications

## 2018-07-02 NOTE — Op Note (Signed)
Patient:  [name]  DOB:  08-02-1951  MRN:  [id number]   Preop Diagnosis: Tonsillar carcinoma, need for central venous access  Postop Diagnosis: Same  Procedure: Port-A-Cath insertion  Surgeon: Aviva Signs, MD  Anes: MAC  Indications: Patient is a 67 year old white male who presents for Port-A-Cath insertion.  He is about to undergo chemotherapy for tonsillar carcinoma.  The risks and benefits of the procedure including bleeding, infection, and the possibility of pneumothorax were fully explained to the patient, who gave informed consent.  Procedure note: The patient was placed in the Trendelenburg position after the left upper chest was prepped and draped using the usual sterile technique with DuraPrep.  Surgical site confirmation was performed.  1% Xylocaine was used for local anesthesia.  Incision was made below the left clavicle.  A subcutaneous pocket was formed.  A needle was advanced into the left subclavian vein using the Seldinger technique without difficulty.  A guidewire was then advanced into the right atrium under fluoroscopic guidance.  An introducer and peel-away sheath were placed over the guidewire.  The catheter was then inserted through the peel-away sheath and the peel-away sheath was removed.  The catheter was then attached to the port and the port placed in subcutaneous pocket.  Adequate positioning was confirmed by fluoroscopy.  Good backflow of blood was noted on aspiration of the port.  The port was flushed with heparin flush.  Subcutaneous layer was reapproximated using a 3-0 Vicryl interrupted suture.  The skin was closed using a 4-0 Monocryl subcuticular suture.  Dermabond was applied.  All tape and needle counts were correct at the end of the procedure.  The patient was transferred to PACU in stable condition.  A chest x-ray will be performed at that time.  Complications: None  EBL: Minimal  Specimen: None

## 2018-07-02 NOTE — Discharge Instructions (Signed)
Implanted Port Insertion, Care After °This sheet gives you information about how to care for yourself after your procedure. Your health care provider may also give you more specific instructions. If you have problems or questions, contact your health care provider. °What can I expect after the procedure? °After your procedure, it is common to have: °· Discomfort at the port insertion site. °· Bruising on the skin over the port. This should improve over 3-4 days. ° °Follow these instructions at home: °Port care °· After your port is placed, you will get a manufacturer's information card. The card has information about your port. Keep this card with you at all times. °· Take care of the port as told by your health care provider. Ask your health care provider if you or a family member can get training for taking care of the port at home. A home health care nurse may also take care of the port. °· Make sure to remember what type of port you have. °Incision care °· Follow instructions from your health care provider about how to take care of your port insertion site. Make sure you: °? Wash your hands with soap and water before you change your bandage (dressing). If soap and water are not available, use hand sanitizer. °? Change your dressing as told by your health care provider. °? Leave stitches (sutures), skin glue, or adhesive strips in place. These skin closures may need to stay in place for 2 weeks or longer. If adhesive strip edges start to loosen and curl up, you may trim the loose edges. Do not remove adhesive strips completely unless your health care provider tells you to do that. °· Check your port insertion site every day for signs of infection. Check for: °? More redness, swelling, or pain. °? More fluid or blood. °? Warmth. °? Pus or a bad smell. °General instructions °· Do not take baths, swim, or use a hot tub until your health care provider approves. °· Do not lift anything that is heavier than 10 lb (4.5  kg) for a week, or as told by your health care provider. °· Ask your health care provider when it is okay to: °? Return to work or school. °? Resume usual physical activities or sports. °· Do not drive for 24 hours if you were given a medicine to help you relax (sedative). °· Take over-the-counter and prescription medicines only as told by your health care provider. °· Wear a medical alert bracelet in case of an emergency. This will tell any health care providers that you have a port. °· Keep all follow-up visits as told by your health care provider. This is important. °Contact a health care provider if: °· You cannot flush your port with saline as directed, or you cannot draw blood from the port. °· You have a fever or chills. °· You have more redness, swelling, or pain around your port insertion site. °· You have more fluid or blood coming from your port insertion site. °· Your port insertion site feels warm to the touch. °· You have pus or a bad smell coming from the port insertion site. °Get help right away if: °· You have chest pain or shortness of breath. °· You have bleeding from your port that you cannot control. °Summary °· Take care of the port as told by your health care provider. °· Change your dressing as told by your health care provider. °· Keep all follow-up visits as told by your health care provider. °  This information is not intended to replace advice given to you by your health care provider. Make sure you discuss any questions you have with your health care provider. °Document Released: 09/28/2013 Document Revised: 10/29/2016 Document Reviewed: 10/29/2016 °Elsevier Interactive Patient Education © 2017 [employer] °Implanted Port Home Guide °An implanted port is a type of central line that is placed under the skin. Central lines are used to provide IV access when treatment or nutrition needs to be given through a person’s veins. Implanted ports are used for long-term IV access. An implanted port  may be placed because: °· You need IV medicine that would be irritating to the small veins in your hands or arms. °· You need long-term IV medicines, such as antibiotics. °· You need IV nutrition for a long period. °· You need frequent blood draws for lab tests. °· You need dialysis. ° °Implanted ports are usually placed in the chest area, but they can also be placed in the upper arm, the abdomen, or the leg. An implanted port has two main parts: °· Reservoir. The reservoir is round and will appear as a small, raised area under your skin. The reservoir is the part where a needle is inserted to give medicines or draw blood. °· Catheter. The catheter is a thin, flexible tube that extends from the reservoir. The catheter is placed into a large vein. Medicine that is inserted into the reservoir goes into the catheter and then into the vein. ° °How will I care for my incision site? °Do not get the incision site wet. Bathe or shower as directed by your health care provider. °How is my port accessed? °Special steps must be taken to access the port: °· Before the port is accessed, a numbing cream can be placed on the skin. This helps numb the skin over the port site. °· Your health care provider uses a sterile technique to access the port. °? Your health care provider must put on a mask and sterile gloves. °? The skin over your port is cleaned carefully with an antiseptic and allowed to dry. °? The port is gently pinched between sterile gloves, and a needle is inserted into the port. °· Only "non-coring" port needles should be used to access the port. Once the port is accessed, a blood return should be checked. This helps ensure that the port is in the vein and is not clogged. °· If your port needs to remain accessed for a constant infusion, a clear (transparent) bandage will be placed over the needle site. The bandage and needle will need to be changed every week, or as directed by your health care provider. °· Keep the  bandage covering the needle clean and dry. Do not get it wet. Follow your health care provider’s instructions on how to take a shower or bath while the port is accessed. °· If your port does not need to stay accessed, no bandage is needed over the port. ° °What is flushing? °Flushing helps keep the port from getting clogged. Follow your health care provider’s instructions on how and when to flush the port. Ports are usually flushed with saline solution or a medicine called heparin. The need for flushing will depend on how the port is used. °· If the port is used for intermittent medicines or blood draws, the port will need to be flushed: °? After medicines have been given. °? After blood has been drawn. °? As part of routine maintenance. °· If a constant infusion is   running, the port may not need to be flushed. ° °How long will my port stay implanted? °The port can stay in for as long as your health care provider thinks it is needed. When it is time for the port to come out, surgery will be done to remove it. The procedure is similar to the one performed when the port was put in. °When should I seek immediate medical care? °When you have an implanted port, you should seek immediate medical care if: °· You notice a bad smell coming from the incision site. °· You have swelling, redness, or drainage at the incision site. °· You have more swelling or pain at the port site or the surrounding area. °· You have a fever that is not controlled with medicine. ° °This information is not intended to replace advice given to you by your health care provider. Make sure you discuss any questions you have with your health care provider. °Document Released: 12/08/2005 Document Revised: 05/15/2016 Document Reviewed: 08/15/2013 °Elsevier Interactive Patient Education © 2017 Elsevier Inc. ° °

## 2018-07-02 NOTE — Interval H&P Note (Signed)
History and Physical Interval Note:  07/02/2018 7:33 AM  [name]  has presented today for surgery, with the diagnosis of tonsillar cancer  The various methods of treatment have been discussed with the patient and family. After consideration of risks, benefits and other options for treatment, the patient has consented to  Procedure(s): INSERTION PORT-A-CATH (N/A) as a surgical intervention .  The patient's history has been reviewed, patient examined, no change in status, stable for surgery.  I have reviewed the patient's chart and labs.  Questions were answered to the patient's satisfaction.     Aviva Signs

## 2018-07-05 ENCOUNTER — Ambulatory Visit (HOSPITAL_COMMUNITY): Payer: Self-pay

## 2018-07-05 ENCOUNTER — Ambulatory Visit (HOSPITAL_COMMUNITY): Payer: Medicare Other | Admitting: Hematology

## 2018-07-05 ENCOUNTER — Encounter (HOSPITAL_COMMUNITY): Payer: Self-pay | Admitting: General Surgery

## 2018-07-08 ENCOUNTER — Ambulatory Visit (HOSPITAL_COMMUNITY): Payer: Medicare Other | Admitting: Hematology

## 2018-07-08 ENCOUNTER — Ambulatory Visit (HOSPITAL_COMMUNITY): Payer: Self-pay

## 2018-07-09 ENCOUNTER — Encounter (HOSPITAL_COMMUNITY): Payer: Self-pay

## 2018-07-09 ENCOUNTER — Encounter (HOSPITAL_COMMUNITY): Payer: Self-pay | Admitting: *Deleted

## 2018-07-09 ENCOUNTER — Other Ambulatory Visit (HOSPITAL_COMMUNITY): Payer: Self-pay

## 2018-07-09 DIAGNOSIS — C099 Malignant neoplasm of tonsil, unspecified: Secondary | ICD-10-CM

## 2018-07-09 NOTE — Progress Notes (Signed)
Patient schedule faxed to radiation at [facility] to [place].  Confirmation received.

## 2018-07-12 ENCOUNTER — Inpatient Hospital Stay (HOSPITAL_BASED_OUTPATIENT_CLINIC_OR_DEPARTMENT_OTHER): Payer: Medicare Other | Admitting: Hematology

## 2018-07-12 ENCOUNTER — Encounter (HOSPITAL_COMMUNITY): Payer: Self-pay

## 2018-07-12 ENCOUNTER — Inpatient Hospital Stay (HOSPITAL_COMMUNITY): Payer: Medicare Other | Attending: Hematology

## 2018-07-12 ENCOUNTER — Encounter (HOSPITAL_COMMUNITY): Payer: Self-pay | Admitting: Hematology

## 2018-07-12 ENCOUNTER — Inpatient Hospital Stay (HOSPITAL_COMMUNITY): Payer: Medicare Other

## 2018-07-12 VITALS — BP 124/60 | HR 66 | Temp 99.0°F | Resp 18 | Wt 175.8 lb

## 2018-07-12 DIAGNOSIS — E785 Hyperlipidemia, unspecified: Secondary | ICD-10-CM | POA: Diagnosis not present

## 2018-07-12 DIAGNOSIS — R634 Abnormal weight loss: Secondary | ICD-10-CM

## 2018-07-12 DIAGNOSIS — C099 Malignant neoplasm of tonsil, unspecified: Secondary | ICD-10-CM

## 2018-07-12 DIAGNOSIS — Z87891 Personal history of nicotine dependence: Secondary | ICD-10-CM | POA: Diagnosis not present

## 2018-07-12 DIAGNOSIS — Z5111 Encounter for antineoplastic chemotherapy: Secondary | ICD-10-CM

## 2018-07-12 DIAGNOSIS — Z79899 Other long term (current) drug therapy: Secondary | ICD-10-CM | POA: Insufficient documentation

## 2018-07-12 DIAGNOSIS — Z7689 Persons encountering health services in other specified circumstances: Secondary | ICD-10-CM | POA: Diagnosis not present

## 2018-07-12 DIAGNOSIS — N189 Chronic kidney disease, unspecified: Secondary | ICD-10-CM | POA: Insufficient documentation

## 2018-07-12 DIAGNOSIS — D708 Other neutropenia: Secondary | ICD-10-CM

## 2018-07-12 LAB — COMPREHENSIVE METABOLIC PANEL
ALT: 16 U/L (ref 0–44)
AST: 19 U/L (ref 15–41)
Albumin: 3.8 g/dL (ref 3.5–5.0)
Alkaline Phosphatase: 65 U/L (ref 38–126)
Anion gap: 6 (ref 5–15)
BUN: 10 mg/dL (ref 8–23)
CO2: 31 mmol/L (ref 22–32)
Calcium: 9.1 mg/dL (ref 8.9–10.3)
Chloride: 102 mmol/L (ref 98–111)
Creatinine, Ser: 1.12 mg/dL (ref 0.61–1.24)
GFR calc Af Amer: >60 mL/min (ref >60)
GFR calc non Af Amer: >60 mL/min (ref >60)
Glucose, Bld: 110 mg/dL — ABNORMAL HIGH (ref 70–99)
Potassium: 4.6 mmol/L (ref 3.5–5.1)
SODIUM: 139 mmol/L (ref 135–145)
Total Bilirubin: 0.9 mg/dL (ref 0.3–1.2)
Total Protein: 7.2 g/dL (ref 6.5–8.1)

## 2018-07-12 LAB — CBC WITH DIFFERENTIAL/PLATELET
Basophils Absolute: 0 10*3/uL (ref 0.0–0.1)
Basophils Relative: 0 %
Eosinophils Absolute: 0.1 10*3/uL (ref 0.0–0.7)
Eosinophils Relative: 2 %
HCT: 44.1 % (ref 39.0–52.0)
HEMOGLOBIN: 14.7 g/dL (ref 13.0–17.0)
LYMPHS ABS: 1.2 10*3/uL (ref 0.7–4.0)
Lymphocytes Relative: 43 %
MCH: 31.1 pg (ref 26.0–34.0)
MCHC: 33.3 g/dL (ref 30.0–36.0)
MCV: 93.4 fL (ref 78.0–100.0)
Monocytes Absolute: 0.3 10*3/uL (ref 0.1–1.0)
Monocytes Relative: 11 %
Neutro Abs: 1.2 10*3/uL — ABNORMAL LOW (ref 1.7–7.7)
Neutrophils Relative %: 44 %
Platelets: 168 10*3/uL (ref 150–400)
RBC: 4.72 MIL/uL (ref 4.22–5.81)
RDW: 14.4 % (ref 11.5–15.5)
WBC: 2.8 10*3/uL — AB (ref 4.0–10.5)

## 2018-07-12 MED ORDER — FILGRASTIM 300 MCG/0.5ML IJ SOSY
300.0000 ug | PREFILLED_SYRINGE | Freq: Once | INTRAMUSCULAR | Status: AC
Start: 2018-07-12 — End: 2018-07-12
  Administered 2018-07-12: 300 ug via SUBCUTANEOUS

## 2018-07-12 MED ORDER — FILGRASTIM 300 MCG/ML IJ SOLN
300.0000 ug | Freq: Once | INTRAMUSCULAR | Status: DC
  Filled 2018-07-12: qty 1

## 2018-07-12 NOTE — Assessment & Plan Note (Addendum)
1.  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 Benjamine Mola on 05/13/2018, status post biopsy consistent with squamous cell carcinoma, P 16+ -Weight loss of 5 pounds in the last 1 month - Baseline hearing loss and chronic kidney disease with creatinine of 1.34. - Oropharyngeal examination shows a necrotic left tonsillar mass, not clearly visualized secondary to increased gag reflex.  There is a left lower neck lymph node palpable.  I discussed the results of the PET CT scan with the patient which showed level 2, 3, 5 meta stasis on the left neck and mildly metabolic right level 2 lymph node along with the left tonsillar primary.  No other hypermetabolic lesion seen. -I have recommended combination chemoradiation therapy.  He is not a candidate for high-dose cisplatin given his chronic kidney disease and hearing dysfunction.  He is also not a candidate for weekly cisplatin as his creatinine might get worse.  I have recommended 94-01 [place] protocol with 5-FU administered as a 24-hour continuous infusion at a dose of 600 mg per M square per day for 4 days and carboplatin given as a daily bolus of 70 mg/m2/day for 4 days.  Chemo will be given on days 1, 22 and 43.  We will have a port placed by 1 of our surgeons. -He had left lower jaw teeth pulled out.  He also had a port placed.  I have reviewed his blood counts today.  His white count is low at 2.8 with ANC of 1200.  His blood count in June was also low at 2.5.  I have recommended Neupogen 300 mcg today.  We will start his chemotherapy tomorrow once his ANC is more than 1500.  We talked about the side effects of this regimen including severe mucositis and cytopenias.  As he has baseline neutropenia, will monitor his counts on a weekly basis.  I will see him back in 3 weeks for follow-up.

## 2018-07-12 NOTE — Patient Instructions (Signed)
[place] at [facility] Discharge Instructions  Received Neupogen injection only today. Chemo tx held. Follow-up as scheduled. Call cl;inic for any questions or concerns   Thank you for choosing [place] [facility] to provide your oncology and hematology care.  To afford each patient quality time with our provider, please arrive at least 15 minutes before your scheduled appointment time.   If you have a lab appointment with the [facility] please come in thru the  Main Entrance and check in at the main information desk  You need to re-schedule your appointment should you arrive 10 or more minutes late.  We strive to give you quality time with our providers, and arriving late affects you and other patients whose appointments are after yours.  Also, if you no show three or more times for appointments you may be dismissed from the clinic at the providers discretion.     Again, thank you for choosing [facility].  Our hope is that these requests will decrease the amount of time that you wait before being seen by our physicians.       _____________________________________________________________  Should you have questions after your visit to [facility], please contact our office at [phone] between the hours of 8:30 a.m. and 4:30 p.m.  Voicemails left after 4:30 p.m. will not be returned until the following business day.  For prescription refill requests, have your pharmacy contact our office.       Resources For Cancer Patients and their Caregivers ? American Cancer Society: Can assist with transportation, wigs, general needs, runs Look Good Feel Better.        (716) 515-7111 ? Cancer Care: Provides financial assistance, online support groups, medication/co-pay assistance.  1-800-813-HOPE 570-073-3568) ? [facility] Assists [place] cancer patients and their families through emotional  , educational and financial support.  628 742 5818 ? [place] DSS Where to apply for food stamps, Medicaid and utility assistance. (479) 750-6965 ? [facility]: Transportation to medical appointments. (320)838-6904 ? Social Security Administration: May apply for disability if have a Stage IV cancer. (820)557-4895 (534) 538-2009 ? [employer], Disability and Transit Services: Assists with nutrition, care and transit needs. [facility] Support Programs:   > Cancer Support Group  2nd Tuesday of the month 1pm-2pm, Journey Room   > Creative Journey  3rd Tuesday of the month 1130am-1pm, Journey Room

## 2018-07-12 NOTE — Progress Notes (Signed)
MARQUIZE SEIB tolerated Neupogen injection well without complaints or incident. Labs reviewed with and pt seen by Dr. Delton Coombes and chemo tx held due to [place] 1.2. Neupogen 300 mcg ordered x 1 today with repeat labs and possible chemo tx tomorrow per MD. Pt instructed in purpose and side effects of chemo medications and Neupogen injection as well as information on when to call the MD. Written information and thermometer givien to pt as well and he viewed the 5FU pump instructional video. Pt verbalized understanding. VSS upon discharge. Pt discharged self ambulatory in satisfactory condition

## 2018-07-12 NOTE — Progress Notes (Signed)
[name], [name] [address]   CLINIC:  Medical Oncology/Hematology  PCP:  [name] Norlander, DO [place] [id number] [phone]   REASON FOR VISIT:  Follow-up for left tonsillar cancer  CURRENT THERAPY: Carboplatin and 5FU  BRIEF ONCOLOGIC HISTORY:    Malignant neoplasm of tonsil ([place])   06/02/2018 Initial Diagnosis    Tonsillar cancer ([place])      06/15/2018 -  Chemotherapy    The patient had palonosetron (ALOXI) injection 0.25 mg, 0.25 mg, Intravenous,  Once, 0 of 3 cycles CARBOplatin (PARAPLATIN) 140 mg in sodium chloride 0.9 % 100 mL chemo infusion, 70 mg/m2 = 140 mg (original dose ), Intravenous,  Once, 0 of 3 cycles Dose modification: 70 mg/m2 (Cycle 1, Reason: Other (see comments), Comment: french protocol head and neck) fluorouracil (ADRUCIL) 4,750 mg in sodium chloride 0.9 % 55 mL chemo infusion, 600 mg/m2/day = 4,750 mg (original dose ), Intravenous, 4D (96 hours ), 0 of 3 cycles Dose modification: 600 mg/m2/day (Cycle 1, Reason: Other (see comments), Comment: french protocol head and neck)  for chemotherapy treatment.         CANCER STAGING: Cancer Staging Malignant neoplasm of tonsil ([place]) Staging form: Pharynx - HPV-Mediated Oropharynx, AJCC 8th Edition - Clinical stage from 06/02/2018: Stage II (cT3, cN2, cM0) - Unsigned    INTERVAL HISTORY:  [name] 67 y.o. male returns for routine follow-up for tonsillar cancer and consideration for chemotherapy. Patient is feeling good and no complaints today. He is ready to start treatment. Patient had his first radiation treatment this morning. Denies any trouble swallowing or pain in his throat. Denies any nausea, vomiting, or diarrhea.      REVIEW OF SYSTEMS:  Review of Systems  All other systems reviewed and are negative.    PAST MEDICAL/SURGICAL HISTORY:  Past Medical History:  Diagnosis Date  . HOH (hard of hearing)   . Hyperlipidemia   . Mass  of oral cavity 05/12/2018  . Positive FIT (fecal immunochemical test) 05/12/2018  . PTSD (post-traumatic stress disorder)    has not been offically diagnosised  . Tonsillar cancer ([place]) 06/02/2018   Past Surgical History:  Procedure Laterality Date  . APPENDECTOMY  1969  . COLONOSCOPY W/ POLYPECTOMY    . FINGER FRACTURE SURGERY Left   . MULTIPLE EXTRACTIONS WITH ALVEOLOPLASTY N/A 06/15/2018   Procedure: Extraction of tooth #'s 2,12,13,18,20,and 21 with alveoloplasty and gross debridement of remaining teeth;  Surgeon: [name], DDS;  Location: [facility];  Service: Oral Surgery;  Laterality: N/A;  . PORTACATH PLACEMENT Left 07/02/2018   Procedure: INSERTION PORT-A-CATH;  Surgeon: [name] Signs, MD;  Location: [facility];  Service: General;  Laterality: Left;     SOCIAL HISTORY:  Social History   Socioeconomic History  . Marital status: Divorced    Spouse name: Not on file  . Number of children: Not on file  . Years of education: Not on file  . Highest education level: Not on file  Occupational History  . Occupation: English as a second language teacher, retired  Scientific laboratory technician  . Financial resource strain: Not on file  . Food insecurity:    Worry: Not on file    Inability: Not on file  . Transportation needs:    Medical: Not on file    Non-medical: Not on file  Tobacco Use  . Smoking status: Former Smoker    Years: 15.00    Types: Cigarettes    Last attempt to quit:  2000    Years since quitting: 19.5  . Smokeless tobacco: Never Used  Substance and Sexual Activity  . Alcohol use: Yes    Alcohol/week: 7.2 oz    Types: 12 Cans of beer per week  . Drug use: Never  . Sexual activity: Not Currently  Lifestyle  . Physical activity:    Days per week: Not on file    Minutes per session: Not on file  . Stress: Not on file  Relationships  . Social connections:    Talks on phone: Not on file    Gets together: Not on file    Attends religious service: Not on file    Active member of club or organization:  Not on file    Attends meetings of clubs or organizations: Not on file    Relationship status: Not on file  . Intimate partner violence:    Fear of current or ex partner: Not on file    Emotionally abused: Not on file    Physically abused: Not on file    Forced sexual activity: Not on file  Other Topics Concern  . 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  He lives independently with his dog here in [place].  He formally lived in [place].    FAMILY HISTORY:  Family History  Problem Relation Age of Onset  . Diabetes Father     CURRENT MEDICATIONS:  Outpatient Encounter Medications as of 07/12/2018  Medication Sig  . ibuprofen (ADVIL,MOTRIN) 200 MG tablet Take 400 mg by mouth every 6 (six) hours as needed for moderate pain.   Marland Kitchen lidocaine-prilocaine (EMLA) cream Apply to affected area once (Patient not taking: Reported on 06/29/2018)  . oxyCODONE-acetaminophen (PERCOCET) 5-325 MG tablet Take one tablet by mouth every 4 hours as needed for pain. (Patient not taking: Reported on 06/29/2018)  . prochlorperazine (COMPAZINE) 10 MG tablet Take 1 tablet (10 mg total) by mouth every 6 (six) hours as needed (Nausea or vomiting).  . sodium fluoride (FLUORISHIELD) 1.1 % GEL dental gel Instill one drop of gel per tooth space of fluoride tray. Place over teeth for 5 minutes. Remove. Spit out excess. Repeat nightly.  . [DISCONTINUED] CARBOPLATIN IV Inject into the vein.  . [DISCONTINUED] fluorouracil CALGB 63875 in sodium chloride 0.9 % 150 mL Inject into the vein over 96 hr.   No facility-administered encounter medications on file as of 07/12/2018.     ALLERGIES:  No Known Allergies   PHYSICAL EXAM:  ECOG Performance status: 0  VITAL Juarez: BP: 131/71, P: 74, R:18, TEMP: 98.0, 02:98%  Physical Exam HEENT: Necrotic left tonsillar mass present.  No other masses.  Lymph node palpable in the left neck.  No palpable lymph node clinically in  the right neck.  LABORATORY DATA:  I have reviewed the labs as listed.  CBC    Component Value Date/Time   WBC 2.8 (L) 07/12/2018 0939   RBC 4.72 07/12/2018 0939   HGB 14.7 07/12/2018 0939   HGB 14.2 05/12/2018 0949   HCT 44.1 07/12/2018 0939   HCT 42.0 05/12/2018 0949   PLT 168 07/12/2018 0939   PLT 256 05/12/2018 0949   MCV 93.4 07/12/2018 0939   MCV 87 05/12/2018 0949   MCH 31.1 07/12/2018 0939   MCHC 33.3 07/12/2018 0939   RDW 14.4 07/12/2018 0939   RDW 13.0 05/12/2018 0949   LYMPHSABS 1.2 07/12/2018 0939   LYMPHSABS 1.1  05/12/2018 0949   MONOABS 0.3 07/12/2018 0939   EOSABS 0.1 07/12/2018 0939   EOSABS 0.1 05/12/2018 0949   BASOSABS 0.0 07/12/2018 0939   BASOSABS 0.0 05/12/2018 0949   CMP Latest Ref Rng & Units 07/12/2018 06/15/2018 05/12/2018  Glucose 70 - 99 mg/dL 110(H) 107(H) 98  BUN 8 - 23 mg/dL 10 13 18   Creatinine 0.61 - 1.24 mg/dL 1.12 1.12 1.34(H)  Sodium 135 - 145 mmol/L 139 140 143  Potassium 3.5 - 5.1 mmol/L 4.6 3.7 4.2  Chloride 98 - 111 mmol/L 102 107 101  CO2 22 - 32 mmol/L 31 23 25   Calcium 8.9 - 10.3 mg/dL 9.1 8.7(L) 9.8  Total Protein 6.5 - 8.1 g/dL 7.2 - 6.9  Total Bilirubin 0.3 - 1.2 mg/dL 0.9 - 0.6  Alkaline Phos 38 - 126 U/L 65 - 67  AST 15 - 41 U/L 19 - 13  ALT 0 - 44 U/L 16 - 10         ASSESSMENT & PLAN:   Malignant neoplasm of tonsil ([facility]) 1.  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 Benjamine Mola on 05/13/2018, status post biopsy consistent with squamous cell carcinoma, P 16+ -Weight loss of 5 pounds in the last 1 month - Baseline hearing loss and chronic kidney disease with creatinine of 1.34. - Oropharyngeal examination shows a necrotic left tonsillar mass, not clearly visualized secondary to increased gag reflex.  There is a left lower neck lymph node palpable.  I discussed the results of the PET CT scan with the patient  which showed level 2, 3, 5 meta stasis on the left neck and mildly metabolic right level 2 lymph node along with the left tonsillar primary.  No other hypermetabolic lesion seen. -I have recommended combination chemoradiation therapy.  He is not a candidate for high-dose cisplatin given his chronic kidney disease and hearing dysfunction.  He is also not a candidate for weekly cisplatin as his creatinine might get worse.  I have recommended 94-01 [place] protocol with 5-FU administered as a 24-hour continuous infusion at a dose of 600 mg per M square per day for 4 days and carboplatin given as a daily bolus of 70 mg/m2/day for 4 days.  Chemo will be given on days 1, 22 and 43.  We will have a port placed by 1 of our surgeons. -He had left lower jaw teeth pulled out.  He also had a port placed.  I have reviewed his blood counts today.  His white count is low at 2.8 with ANC of 1200.  His blood count in June was also low at 2.5.  I have recommended Neupogen 300 mcg today.  We will start his chemotherapy tomorrow once his ANC is more than 1500.  We talked about the side effects of this regimen including severe mucositis and cytopenias.  As he has baseline neutropenia, will monitor his counts on a weekly basis.  I will see him back in 3 weeks for follow-up.      Orders placed this encounter:  No orders of the defined types were placed in this encounter.     Derek Jack, MD [facility] [phone]

## 2018-07-13 ENCOUNTER — Other Ambulatory Visit: Payer: Self-pay

## 2018-07-13 ENCOUNTER — Inpatient Hospital Stay (HOSPITAL_COMMUNITY): Payer: Medicare Other

## 2018-07-13 ENCOUNTER — Encounter (HOSPITAL_COMMUNITY): Payer: Self-pay

## 2018-07-13 ENCOUNTER — Encounter (HOSPITAL_COMMUNITY): Payer: Self-pay | Admitting: Speech Pathology

## 2018-07-13 ENCOUNTER — Ambulatory Visit (HOSPITAL_COMMUNITY): Payer: Medicare Other | Admitting: Speech Pathology

## 2018-07-13 VITALS — BP 124/66 | HR 79 | Temp 98.2°F | Resp 18 | Wt 175.4 lb

## 2018-07-13 DIAGNOSIS — R1311 Dysphagia, oral phase: Secondary | ICD-10-CM | POA: Diagnosis present

## 2018-07-13 DIAGNOSIS — C099 Malignant neoplasm of tonsil, unspecified: Secondary | ICD-10-CM | POA: Diagnosis not present

## 2018-07-13 LAB — CBC WITH DIFFERENTIAL/PLATELET
BASOS ABS: 0 10*3/uL (ref 0.0–0.1)
Basophils Relative: 0 %
Eosinophils Absolute: 0.1 10*3/uL (ref 0.0–0.7)
Eosinophils Relative: 0 %
HEMATOCRIT: 44.1 % (ref 39.0–52.0)
HEMOGLOBIN: 14.6 g/dL (ref 13.0–17.0)
LYMPHS PCT: 8 %
Lymphs Abs: 1.6 10*3/uL (ref 0.7–4.0)
MCH: 31.1 pg (ref 26.0–34.0)
MCHC: 33.1 g/dL (ref 30.0–36.0)
MCV: 93.8 fL (ref 78.0–100.0)
Monocytes Absolute: 0.6 10*3/uL (ref 0.1–1.0)
Monocytes Relative: 3 %
NEUTROS ABS: 18.4 10*3/uL — AB (ref 1.7–7.7)
Neutrophils Relative %: 89 %
Platelets: 162 10*3/uL (ref 150–400)
RBC: 4.7 MIL/uL (ref 4.22–5.81)
RDW: 14.5 % (ref 11.5–15.5)
WBC: 20.6 10*3/uL — AB (ref 4.0–10.5)

## 2018-07-13 MED ORDER — SODIUM CHLORIDE 0.9% FLUSH
10.0000 mL | INTRAVENOUS | Status: DC | PRN
  Administered 2018-07-13: 10 mL
  Filled 2018-07-13: qty 10

## 2018-07-13 MED ORDER — PALONOSETRON HCL INJECTION 0.25 MG/5ML
0.2500 mg | Freq: Once | INTRAVENOUS | Status: AC
  Administered 2018-07-13: 0.25 mg via INTRAVENOUS

## 2018-07-13 MED ORDER — DEXAMETHASONE SODIUM PHOSPHATE 10 MG/ML IJ SOLN
10.0000 mg | Freq: Once | INTRAMUSCULAR | Status: AC
  Administered 2018-07-13: 10 mg via INTRAVENOUS

## 2018-07-13 MED ORDER — PALONOSETRON HCL INJECTION 0.25 MG/5ML
INTRAVENOUS | Status: AC
  Filled 2018-07-13: qty 5

## 2018-07-13 MED ORDER — SODIUM CHLORIDE 0.9 % IV SOLN
Freq: Once | INTRAVENOUS | Status: AC
  Administered 2018-07-13: 12:00:00 via INTRAVENOUS

## 2018-07-13 MED ORDER — SODIUM CHLORIDE 0.9 % IV SOLN
70.0000 mg/m2 | Freq: Once | INTRAVENOUS | Status: AC
  Administered 2018-07-13: 140 mg via INTRAVENOUS
  Filled 2018-07-13: qty 14

## 2018-07-13 MED ORDER — HEPARIN SOD (PORK) LOCK FLUSH 100 UNIT/ML IV SOLN: 500.0000 [IU] | Freq: Once | INTRAVENOUS | Status: DC | PRN

## 2018-07-13 MED ORDER — DEXAMETHASONE SODIUM PHOSPHATE 10 MG/ML IJ SOLN
INTRAMUSCULAR | Status: AC
  Filled 2018-07-13: qty 1

## 2018-07-13 MED ORDER — FLUOROURACIL CHEMO INJECTION 5 GM/100ML
600.0000 mg/m2/d | INTRAVENOUS | Status: DC
  Administered 2018-07-13: 4750 mg via INTRAVENOUS
  Filled 2018-07-13: qty 95

## 2018-07-13 NOTE — Patient Instructions (Signed)
[facility] Discharge Instructions for Patients Receiving Chemotherapy   Beginning January 23rd 2017 lab work for the Cancer Center will be done in the  Main lab at [facility] on 1st floor. If you have a lab appointment with the Cancer Center please come in thru the  Main Entrance and check in at the main information desk   Today you received the following chemotherapy agents   To help prevent nausea and vomiting after your treatment, we encourage you to take your nausea medication     If you develop nausea and vomiting, or diarrhea that is not controlled by your medication, call the clinic.  The clinic phone number is [phone]. Office hours are Monday-Friday 8:30am-5:00pm.  BELOW ARE SYMPTOMS THAT SHOULD BE REPORTED IMMEDIATELY:  *FEVER GREATER THAN 101.0 F  *CHILLS WITH OR WITHOUT FEVER  NAUSEA AND VOMITING THAT IS NOT CONTROLLED WITH YOUR NAUSEA MEDICATION  *UNUSUAL SHORTNESS OF BREATH  *UNUSUAL BRUISING OR BLEEDING  TENDERNESS IN MOUTH AND THROAT WITH OR WITHOUT PRESENCE OF ULCERS  *URINARY PROBLEMS  *BOWEL PROBLEMS  UNUSUAL RASH Items with * indicate a potential emergency and should be followed up as soon as possible. If you have an emergency after office hours please contact your primary care physician or go to the nearest emergency department.  Please call the clinic during office hours if you have any questions or concerns.   You may also contact the Patient Navigator at [phone] should you have any questions or need assistance in obtaining follow up care.      Resources For Cancer Patients and their Caregivers ? American Cancer Society: Can assist with transportation, wigs, general needs, runs Look Good Feel Better.        1-888-227-6333 ? Cancer Care: Provides financial assistance, online support groups, medication/co-pay assistance.  1-800-813-HOPE (4673) ? [facility] Assists [place] cancer  patients and their families through emotional , educational and financial support.  336-427-4357 ? [place] DSS Where to apply for food stamps, Medicaid and utility assistance. 336-342-1394 ? [facility]: Transportation to medical appointments. 336-347-2287 ? Social Security Administration: May apply for disability if have a Stage IV cancer. 336-342-7796 1-800-772-1213 ? [place] Aging, Disability and Transit Services: Assists with nutrition, care and transit needs. 336-349-2343         

## 2018-07-13 NOTE — Patient Instructions (Signed)
Radiation Therapy to the Head and Neck: What You Need to Know About Swallowing   This information describes swallowing problems that can be caused by radiation therapy to the head and neck and how to prevent them. Normal Swallowing Many muscles and nerves work together to help you swallow (see figure below). When you eat and drink, food and liquids mix with your saliva. Your saliva makes the food soft and moist and chewing breaks the food down further. As you chew, the food and saliva form a ball called a bolus.  The bolus gets pushed to the back of your mouth with your tongue. Then, a reflex takes over and the back of your tongue squeezes back and your larynx (voice box) closes. This sends the bolus down your esophagus (food pipe) and into your stomach. If your tongue is weak or if your larynx doesn't close all the way, food or liquid will enter your trachea (wind pipe) or lungs (aspiration), which can cause pneumonia.  Effects of Cancer and Treatment on Swallowing Depending on the size and location of your tumor, the structures that support normal swallowing may not work well. The side effects of treatment can also affect these structures. Radiation therapy can cause: Sores (mucositis) in the mouth and throat  Dry mouth  Thicker saliva  Swelling  Pain when swallowing These symptoms begin about the second week of treatment and may get worse during treatment. Most symptoms will start to improve about 2 weeks after treatment has ended. Radiation therapy can also cause permanent tissue scarring. The effects of this scarring depend on the area that was treated. Below are some effects of scarring: The muscles attached to your jaw may tighten and make it difficult to open your mouth and chew your food. This is called trismus.  Your salivary glands may not produce enough saliva. This can make swallowing difficult because your mouth is too dry.  The muscles in your tongue and the back of your throat  may not be able to move as well. This can make it harder to push the bolus down your throat and open up your esophagus.  Your larynx may not lift enough to open your esophagus.  Your esophagus may narrow, which can cause food to get stuck in the back of your throat. Not everyone will have all of these problems. Your treatment will be planned to decrease your chances of developing them. Your healthcare team will also teach you things that you can do to help decrease these problems. Other treatments can also affect swallowing. Surgery can affect the structures in your mouth and throat, which could make swallowing more difficult. Some chemotherapy medications can cause sores in the mouth and throat. This can make swallowing painful.  Swallowing Problems Difficulty swallowing is called dysphagia (dis-fey-juh). Your healthcare team will work with you to help you manage this problem. This team includes your doctors, nurses, a swallowing specialist, and a dietitian. Painful swallowing If you have painful swallowing during your treatment, you will be given pain medication to manage it. Take the medication as instructed by your doctor. If it does not help, tell your doctor or nurse. Try to use your swallowing muscles as much as you can. This can help prevent long-term problems with swallowing. Tell your doctor or nurse and swallowing specialist if you have trouble opening your mouth during or after your treatment. They can teach you exercises to help with this. Aspiration When you are having trouble swallowing, food or liquid can pool   in the back of your throat. It can then pass into your airway instead of your esophagus. This is called aspiration. Signs of aspiration include coughing during or after swallowing. Do not try to swallow if you have any of these signs. Call your swallowing specialist immediately. He or she will review the swallowing exercises with you and help figure out why you are having trouble.  He or she will also tell you which foods and liquids are safe for you. Aspiration can lead to pneumonia. Call your doctor or nurse immediately if you have any of the following symptoms: Shortness of breath  Wheezing  Painful breathing  A productive cough (a cough that produces phlegm or mucus)  A temperature of 100.4 F (38 C) or higher  Managing Swallowing Problems Swallowing therapy You will see a swallowing specialist before and during your treatment. He or she will: Explain how treatment can affect your swallowing  Teach you exercises to stretch and strengthen the muscles involved in swallowing Exercises Do these exercises as soon as you begin treatment. They may help decrease long-term swallowing problems. Swallowing Exercises Tongue hold exercise ([place] exercise) 1. Put the tip of your tongue in between your teeth. If you cannot do this, put it against the front of the roof of your mouth. Stick your tongue out as far as you can. 2. Hold this position with your tongue and swallow. Try not to let your tongue tip slip back. Then, relax. Effortful swallow exercise 1. Swallow normally, but squeeze hard with your throat and tongue muscles. 2. Then, relax. Mendelsohn swallow maneuver exercise 1. Swallow normally; feel your voice box go up and down. 2. Swallow again. When you feel your voice box go up, squeeze hard and hold it up for 2 seconds. Then, relax. Supraglottic swallow exercise 1. Take in a regular breath and hold it tightly. 2. While holding your breath, swallow; immediately, cough out the breath. Do not inhale before you cough. Super supraglottic swallow exercise 1. Take in a regular breath and hold it tightly, bearing down as if you are having a bowel movement. 2. While holding your breath, swallow; immediately, cough out the breath. Do not inhale before you cough. Shaker exercise 1. Lay flat on the floor or a bed. 2. Lift your head as if you are looking at your  toes. 3. Hold your head in this position for 10 seconds. Increase the amount of time until you can hold it for 1 minute. Then, relax. 4. Lift your head and lay it back down (do not hold it up.) Repeat this movement 30 times. Do these exercises _____ times a day. Tongue range of motion (ROM) exercises Tongue protrusion exercise 1. Stick out your tongue as far as you can until you feel a good stretch. 2. Hold it there for ________________________. Then, relax. Tongue retraction exercise 1. Pull your tongue far back in your mouth, as if you are gargling or yawning. 2. Hold it there for ________________________. Then, relax. Tongue lateralization exercise 1. Move your tongue as far to the left as you can so you feel a good stretch in your tongue. 2. Hold it there for ________________________. Then, relax. 3. Move your tongue to the right as far as you can until you feel a good stretch in your tongue. 4. Hold it there for ________________________. Then, relax. Tongue tip exercise 1. Place the tip of your tongue behind your top teeth or on your gums. 2. While holding this position, open your mouth as   wide as possible for ______________. Then, relax. Back tongue exercise 1. Say "kuh" with a strong "k" sound; move the back of your tongue as if you were going to say the "k" sound. 2. Hold this position for__________________. Then, relax. Do these exercises _____ times a day. Tongue Strengthening Exercises You will need a tongue depressor or spoon to do these exercises. Tongue tip strengthening exercise 1. Stick out your tongue as far as you can and push it firmly against a tongue depressor or spoon. 2. Hold this position for__________________. Then, relax. Sides of tongue strengthening exercise 1. Place the tongue depressor or spoon against the left side of your tongue; push your tongue firmly against the tongue depressor or spoon. 2. Hold this position for__________________. Then,  relax. 3. Place the tongue depressor or spoon against the right side of your tongue; push your tongue firmly against the tongue depressor or spoon. 4. Hold this position for__________________. Then, relax. Top of the tongue strengthening exercise 1. Push down on your tongue with a tongue depressor or spoon. As you are doing this, push up with your tongue. 2. Hold this position for__________________. Then, relax. Do these exercises _____ times a day. Jaw Exercises Active range of motion and stretching exercises Sit or stand. Hold your head still while doing these exercises. 1. Open your mouth as wide as you can, until you can feel a good stretch but no pain. Hold this stretch for ______ seconds.   2. Move your jaw to the left. Hold this stretch for 3 seconds.   3. Move your jaw to the right. Hold this stretch for 3 seconds.   4. Move your jaw in a circle. Make 5 circles in each direction.   Do these exercises _____ times a day. Passive Stretching Exercise  1. Place 1 thumb on your top teeth in the middle of your jaw. 2. Place the pointer (index) finger of your other hand on your bottom teeth in the middle of your jaw. 3. Open your mouth with your fingers, but do not bite down or resist. Let your fingers do all of the work. 4. Hold this stretch for _____ seconds. Your swallowing specialist may teach you other exercises or strategies to help you continue swallowing throughout your treatment. These will be based on your swallowing evaluations. Dietary Guidelines Eating well is an important part of your cancer treatment. If you are having pain or difficulty swallowing, you may not be able to eat enough food. This can make you lose weight and decrease your energy. You also may not be able to drink enough liquid to stay hydrated. The suggestions in this section may help make swallowing easier and more comfortable for you. Your swallowing specialist will recommend the proper food and liquid  textures for you. When you try new foods and liquids, they should have the textures recommended by your swallowing specialist. The following foods may irritate your throat or be hard to swallow: Very hot or cold foods and liquids  Citrus fruits and juices (e.g., orange, lemon, lime, grapefruit, pineapple)  Tomatoes  Hard, dry, or coarse foods (e.g., toast, crackers, raw vegetables, potato chips, pretzels)  Spices (e.g., pepper, chili powder, horseradish, curry powder, hot sauce, nutmeg) The following foods may be easier to swallow: Soft, pured, or moist foods such as souffls or casseroles  Soft foods such as custards and puddings  Lukewarm or room-temperature foods  Liquid nutritional supplements, such as:  Carnation Breakfast EssentialsTM  Carnation Instant Breakfast VHC  Scandishake    Ensure  Boost  Glucerna (if you have diabetes) These items can be found at your local grocery store, pharmacy, or on the Internet. Try the following suggestions if dry mouth or thick saliva is a problem for you: Drink 8 to 10 cups of liquids a day. Being well-hydrated will help loosen thick saliva.  Keep a bottle of water or other liquid with you when you are away from home. Sip from it frequently throughout the day.  Chew sugarless gum or suck on sugarless candy. This can cause more saliva to flow.  Add sauces, gravies, or other liquids to your foods.  Use a humidifier at home to help loosen thick saliva and secretions. For more dietary recommendations, ask your nurse or dietitian.  Feeding Tube  You may have a feeding tube placed to maintain your weight and ensure you get the nutrition you need. Even if you are using a feeding tube, eat and drink as much as you can. This will decrease the chance that you will have long-term swallowing problems. If you have a feeding tube placed, you nurse will teach you how to use it and give you written instructions. He or she will tell you the kind of formula  to use and how much formula to use for each feeding. At the end of your treatment, your doctor or nurse will tell you how to start taking all of your foods and liquids by mouth. The feeding tube will be removed when you no longer need it. If you have any questions about your feeding tube, contact the office of the doctor.  

## 2018-07-13 NOTE — Progress Notes (Signed)
Labs met today for treatment. Proceed with treatment today per Dr. Delton Coombes.  .  Treatment given per orders. Patient tolerated it well without problems. Vitals stable and discharged home from clinic ambulatory. Follow up as scheduled.

## 2018-07-13 NOTE — Therapy (Signed)
[facility] [place], [place], [address] Phone: [phone]   Fax:  [phone]  Speech Language Pathology Evaluation/Clinical Swallow Evaluation  Patient Details  Name: [name] MRN: [id number] Date of Birth: August 17, 1951 No data recorded  Encounter Date: 07/13/2018  End of Session - 07/13/18 1638    Visit Number  1    Number of Visits  3    Date for SLP Re-Evaluation  09/13/18    Authorization Type  [employer] Medicare $40 copay until deductible met then covered at 100%, no visit limit    SLP Start Time  1444    SLP Stop Time   1545    SLP Time Calculation (min)  61 min    Activity Tolerance  Patient tolerated treatment well       Past Medical History:  Diagnosis Date  . HOH (hard of hearing)   . Hyperlipidemia   . Mass of oral cavity 05/12/2018  . Positive FIT (fecal immunochemical test) 05/12/2018  . PTSD (post-traumatic stress disorder)    has not been offically diagnosised  . Tonsillar cancer ([place]) 06/02/2018    Past Surgical History:  Procedure Laterality Date  . APPENDECTOMY  1969  . COLONOSCOPY W/ POLYPECTOMY    . FINGER FRACTURE SURGERY Left   . MULTIPLE EXTRACTIONS WITH ALVEOLOPLASTY N/A 06/15/2018   Procedure: Extraction of tooth #'s 2,12,13,18,20,and 21 with alveoloplasty and gross debridement of remaining teeth;  Surgeon: Lenn Cal, DDS;  Location: [facility];  Service: Oral Surgery;  Laterality: N/A;  . PORTACATH PLACEMENT Left 07/02/2018   Procedure: INSERTION PORT-A-CATH;  Surgeon: Aviva Signs, MD;  Location: [facility];  Service: General;  Laterality: Left;    There were no vitals filed for this visit.  Subjective Assessment - 07/13/18 1548    Subjective  "I occasionally cough when I am eating."    Currently in Pain?  No/denies        Prior Functional Status - 07/13/18 1550      Prior Functional Status   Cognitive/Linguistic Baseline  Within functional limits    Type of Home  Apartment     Available Help at Discharge  Friend(s)    Education  graduated high school    Vocation  Unemployed      General - 07/13/18 1551      General Information   Date of Onset  05/12/18    HPI  [name] is a 67 yo male who was referred by Dr. 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 He is not a candidate for high-dose cisplatin given his chronic kidney disease and hearing dysfunction. He is also not a candidate for weekly cisplatin as his creatinine might get worse. He will have the recommended 94-01 [place] protocol with 5-FU administered as a 24-hour continuous infusion at a dose of 600 mg per M square per day for 4 days and carboplatin given as a daily bolus of 70 mg/m2/dayfor 4 days. Chemo will be given on days 1, 22 and 43. Radiation started 07/12/18 and will go until 08/26/2018 for 5 days per week.  Mr. Terrio lives alone in [place] with his dog. He has a girlfriend, Arbie Cookey, who just completed some kind of cancer treatment.    Type of Study  Bedside Swallow Evaluation    Previous Swallow Assessment  07/13/18 1641    Clinical Impression Statement Pt currently tolerates regular textures and thin liquids.  Oral motor assessment revealed WNL lingual ROM and WNL lingual strength. Labial ROM was WNL and strength was WNL. Velar ROM appeared WNL. POs: Pt without overt s/s aspiration. Thyroid elevation appeared WNL, and swallows appeared timely. Oral residue noted as negligible. Pt's swallow deemed WFL at this time. Pt did present with occasional delayed dry cough after sips of water and endorses the same occasionally at home.  Because data states the risk for dysphagia during and after radiation treatment is high due to undergoing radiation tx, SLP taught pt about the possibility of reduced/limited ability for PO intake during rad tx. SLP encouraged pt to continue swallowing POs as far into rad tx as possible, even ingesting POs and/or completing HEP shortly after administration of pain meds.   SLP educated pt re: changes to swallowing musculature after rad tx, and why adherence to dysphagia HEP provided today and PO consumption was necessary to inhibit muscular disuse atrophy and manage the effects of radiation fibrosis following radiation tx. Further education was provided regarding possible acute and late effects of radiation therapy including: xerostomia, dysgeusia, salivary changes, mucositis/esophagitis, dehydration, weight loss, fatigue, dysphagia, trismus, and lymphedema. It is prudent that patient is followed by a dentist to reduce risk of cavities, infection, osteoradionecrosis, or other oral issues.   Pt demonstrated understanding of these things to SLP.    SLP then developed a HEP for pt and pt was instructed how to perform exercises involving lingual, vocal, and pharyngeal strengthening. SLP performed each exercise and pt return demonstrated each exercise. SLP ensured pt performance was correct prior to moving on to next exercise. Pt was instructed to complete this program 2-3 times a day, 5-7 days/week until 6 months after his or her last rad tx, then x2-3 a week after that.  Pt will demonstrate  safe and efficient consumption of self regulated regular textures with thin liquids with use of strategies as needed.  Pt will complete pharyngeal swallowing exercises as assigned with use of written cue after initial introduction/model from SLP  Pt will verbalize 3 signs/symptoms of aspiration pneumonia with min assist.      Speech Therapy Frequency  Monthly    Duration  -- 1 visit over 3 months    Treatment/Interventions  Aspiration precaution training;Compensatory strategies;Pharyngeal strengthening exercises;Patient/family education;SLP instruction and feedback;Compensatory techniques    Potential to Achieve Goals  Good    Potential Considerations  Financial resources    SLP Home Exercise Plan  Pt will complete HEP as assigned to facilitate carrover of treatment strategies and techniques in home environment.    Consulted and Agree with Plan of Care  Patient       Patient will benefit from skilled therapeutic intervention in order to improve the following deficits and impairments:   Dysphagia, oral phase    Problem List Patient Active Problem List   Diagnosis Date Noted  . Chronic periodontitis 06/10/2018  . Partial loss of teeth, unspecified edentulism 06/10/2018  . Malignant neoplasm of tonsil ([place]) 06/02/2018  . Mass of oral cavity 05/12/2018  . Positive FIT (fecal immunochemical test) 05/12/2018   Thank you,  Genene Churn, [place]  [place] 07/13/2018, 4:59 PM  [facility] [address] [place], [place], [address] Phone: [phone]   Fax:  [phone]  Name: [name] MRN: [id number] Date of Birth: 1951/09/12  [facility] [place], [place], [address] Phone: [phone]   Fax:  [phone]  Speech Language Pathology Evaluation/Clinical Swallow Evaluation  Patient Details  Name: [name] MRN: [id number] Date of Birth: August 17, 1951 No data recorded  Encounter Date: 07/13/2018  End of Session - 07/13/18 1638    Visit Number  1    Number of Visits  3    Date for SLP Re-Evaluation  09/13/18    Authorization Type  [employer] Medicare $40 copay until deductible met then covered at 100%, no visit limit    SLP Start Time  1444    SLP Stop Time   1545    SLP Time Calculation (min)  61 min    Activity Tolerance  Patient tolerated treatment well       Past Medical History:  Diagnosis Date  . HOH (hard of hearing)   . Hyperlipidemia   . Mass of oral cavity 05/12/2018  . Positive FIT (fecal immunochemical test) 05/12/2018  . PTSD (post-traumatic stress disorder)    has not been offically diagnosised  . Tonsillar cancer ([place]) 06/02/2018    Past Surgical History:  Procedure Laterality Date  . APPENDECTOMY  1969  . COLONOSCOPY W/ POLYPECTOMY    . FINGER FRACTURE SURGERY Left   . MULTIPLE EXTRACTIONS WITH ALVEOLOPLASTY N/A 06/15/2018   Procedure: Extraction of tooth #'s 2,12,13,18,20,and 21 with alveoloplasty and gross debridement of remaining teeth;  Surgeon: Lenn Cal, DDS;  Location: [facility];  Service: Oral Surgery;  Laterality: N/A;  . PORTACATH PLACEMENT Left 07/02/2018   Procedure: INSERTION PORT-A-CATH;  Surgeon: Aviva Signs, MD;  Location: [facility];  Service: General;  Laterality: Left;    There were no vitals filed for this visit.  Subjective Assessment - 07/13/18 1548    Subjective  "I occasionally cough when I am eating."    Currently in Pain?  No/denies        Prior Functional Status - 07/13/18 1550      Prior Functional Status   Cognitive/Linguistic Baseline  Within functional limits    Type of Home  Apartment     Available Help at Discharge  Friend(s)    Education  graduated high school    Vocation  Unemployed      General - 07/13/18 1551      General Information   Date of Onset  05/12/18    HPI  [name] is a 67 yo male who was referred by Dr. 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 He is not a candidate for high-dose cisplatin given his chronic kidney disease and hearing dysfunction. He is also not a candidate for weekly cisplatin as his creatinine might get worse. He will have the recommended 94-01 [place] protocol with 5-FU administered as a 24-hour continuous infusion at a dose of 600 mg per M square per day for 4 days and carboplatin given as a daily bolus of 70 mg/m2/dayfor 4 days. Chemo will be given on days 1, 22 and 43. Radiation started 07/12/18 and will go until 08/26/2018 for 5 days per week.  Mr. Terrio lives alone in [place] with his dog. He has a girlfriend, Arbie Cookey, who just completed some kind of cancer treatment.    Type of Study  Bedside Swallow Evaluation    Previous Swallow Assessment

## 2018-07-14 ENCOUNTER — Inpatient Hospital Stay (HOSPITAL_COMMUNITY): Payer: Medicare Other

## 2018-07-14 ENCOUNTER — Encounter (HOSPITAL_COMMUNITY): Payer: Self-pay

## 2018-07-14 ENCOUNTER — Encounter (HOSPITAL_COMMUNITY): Payer: Self-pay | Admitting: *Deleted

## 2018-07-14 VITALS — BP 133/62 | HR 64 | Temp 98.3°F | Resp 16

## 2018-07-14 DIAGNOSIS — C099 Malignant neoplasm of tonsil, unspecified: Secondary | ICD-10-CM

## 2018-07-14 MED ORDER — DEXAMETHASONE SODIUM PHOSPHATE 10 MG/ML IJ SOLN
INTRAMUSCULAR | Status: AC
  Filled 2018-07-14: qty 1

## 2018-07-14 MED ORDER — DEXAMETHASONE SODIUM PHOSPHATE 10 MG/ML IJ SOLN
10.0000 mg | Freq: Once | INTRAMUSCULAR | Status: AC
  Administered 2018-07-14: 10 mg via INTRAVENOUS

## 2018-07-14 MED ORDER — SODIUM CHLORIDE 0.9 % IV SOLN
70.0000 mg/m2 | Freq: Once | INTRAVENOUS | Status: AC
  Administered 2018-07-14: 140 mg via INTRAVENOUS
  Filled 2018-07-14: qty 14

## 2018-07-14 MED ORDER — SODIUM CHLORIDE 0.9% FLUSH
10.0000 mL | INTRAVENOUS | Status: DC | PRN
  Administered 2018-07-14: 10 mL
  Filled 2018-07-14: qty 10

## 2018-07-14 MED ORDER — SODIUM CHLORIDE 0.9 % IV SOLN
INTRAVENOUS | Status: DC
  Administered 2018-07-14: 12:00:00 via INTRAVENOUS

## 2018-07-14 NOTE — Progress Notes (Signed)
Patient called and left a voicemail to let us know there was an issue with his infusystem pump.  He states that it is giving an error code: no disposable clamp tubing.    I returned call to patient and he states that he got in touch with the infusystem trouble shoot line and they have him back up and running.  He states that it has done this several times, but at this time it is running without difficulty.  I advised him to call the customer service line again if he has any issues.

## 2018-07-14 NOTE — Progress Notes (Signed)
Patient to treatment room for carboplatin with 5FU pump.  Patient stated he had to call Inf[employer] during the night two times for alarms but could not remember the code the pump was displaying.  While on the elevator coming to the CC the pump alarmed again and was turned off completely by a chemo nurse to be evaluated when reached the treatment room.  Patients pump was checked and restarted with no alarms.  Pump was infusing and volume decreased accordingly.    Patient tolerated chemotherapy with no complaints voiced.  Port site clean and dry with no bruising or swelling noted at site.  Dressing intact.  Chemotherapy pump infusing with no alarms noted.  Volume to be infused decreased accordingly.  Patient instructed to call infusystem if the alarms sound and to write down the code for the nurses to make a note of when contacting infusystem for further directions.  VSs with discharge and left ambulatory with no s/s of distress noted.

## 2018-07-14 NOTE — Patient Instructions (Signed)
[facility] Discharge Instructions for Patients Receiving Chemotherapy  Today you received the following chemotherapy agents carboplatin.    If you develop nausea and vomiting that is not controlled by your nausea medication, call the clinic.   BELOW ARE SYMPTOMS THAT SHOULD BE REPORTED IMMEDIATELY:  *FEVER GREATER THAN 100.5 F  *CHILLS WITH OR WITHOUT FEVER  NAUSEA AND VOMITING THAT IS NOT CONTROLLED WITH YOUR NAUSEA MEDICATION  *UNUSUAL SHORTNESS OF BREATH  *UNUSUAL BRUISING OR BLEEDING  TENDERNESS IN MOUTH AND THROAT WITH OR WITHOUT PRESENCE OF ULCERS  *URINARY PROBLEMS  *BOWEL PROBLEMS  UNUSUAL RASH Items with * indicate a potential emergency and should be followed up as soon as possible.  Feel free to call the clinic should you have any questions or concerns. The clinic phone number is [phone].  Please show the [facility] at check-in to the Emergency Department and triage nurse.

## 2018-07-15 ENCOUNTER — Inpatient Hospital Stay (HOSPITAL_COMMUNITY): Payer: Medicare Other

## 2018-07-15 ENCOUNTER — Encounter (HOSPITAL_COMMUNITY): Payer: Self-pay

## 2018-07-15 VITALS — BP 119/69 | HR 50 | Temp 98.5°F | Resp 18 | Wt 175.8 lb

## 2018-07-15 DIAGNOSIS — C099 Malignant neoplasm of tonsil, unspecified: Secondary | ICD-10-CM | POA: Diagnosis not present

## 2018-07-15 MED ORDER — PALONOSETRON HCL INJECTION 0.25 MG/5ML
INTRAVENOUS | Status: AC
  Filled 2018-07-15: qty 5

## 2018-07-15 MED ORDER — SODIUM CHLORIDE 0.9% FLUSH
10.0000 mL | INTRAVENOUS | Status: DC | PRN
  Administered 2018-07-15: 10 mL
  Filled 2018-07-15: qty 10

## 2018-07-15 MED ORDER — SODIUM CHLORIDE 0.9 % IV SOLN
INTRAVENOUS | Status: DC
  Administered 2018-07-15: 12:00:00 via INTRAVENOUS

## 2018-07-15 MED ORDER — DEXAMETHASONE SODIUM PHOSPHATE 10 MG/ML IJ SOLN
10.0000 mg | Freq: Once | INTRAMUSCULAR | Status: AC
  Administered 2018-07-15: 10 mg via INTRAVENOUS
  Filled 2018-07-15: qty 1

## 2018-07-15 MED ORDER — SODIUM CHLORIDE 0.9 % IV SOLN
70.0000 mg/m2 | Freq: Once | INTRAVENOUS | Status: AC
  Administered 2018-07-15: 140 mg via INTRAVENOUS
  Filled 2018-07-15: qty 14

## 2018-07-15 MED ORDER — PALONOSETRON HCL INJECTION 0.25 MG/5ML
0.2500 mg | Freq: Once | INTRAVENOUS | Status: AC
  Administered 2018-07-15: 0.25 mg via INTRAVENOUS

## 2018-07-15 NOTE — Progress Notes (Signed)
Patient tolerated chemotherapy with no complaints voiced.  Port site dressing reinforced with a tegaderm.  Port clean and dry with no bruising or swelling noted.  VSS with discharge and left ambulatory with no s/s of distress noted.

## 2018-07-15 NOTE — Patient Instructions (Signed)
[facility] Discharge Instructions for Patients Receiving Chemotherapy  Today you received the following chemotherapy agents carboplatin.    If you develop nausea and vomiting that is not controlled by your nausea medication, call the clinic.   BELOW ARE SYMPTOMS THAT SHOULD BE REPORTED IMMEDIATELY:  *FEVER GREATER THAN 100.5 F  *CHILLS WITH OR WITHOUT FEVER  NAUSEA AND VOMITING THAT IS NOT CONTROLLED WITH YOUR NAUSEA MEDICATION  *UNUSUAL SHORTNESS OF BREATH  *UNUSUAL BRUISING OR BLEEDING  TENDERNESS IN MOUTH AND THROAT WITH OR WITHOUT PRESENCE OF ULCERS  *URINARY PROBLEMS  *BOWEL PROBLEMS  UNUSUAL RASH Items with * indicate a potential emergency and should be followed up as soon as possible.  Feel free to call the clinic should you have any questions or concerns. The clinic phone number is [phone].  Please show the [facility] at check-in to the Emergency Department and triage nurse.

## 2018-07-16 ENCOUNTER — Encounter (HOSPITAL_COMMUNITY): Payer: Self-pay

## 2018-07-16 ENCOUNTER — Inpatient Hospital Stay (HOSPITAL_COMMUNITY): Payer: Medicare Other

## 2018-07-16 NOTE — Progress Notes (Signed)
Nutrition Follow-up:   Patient with squamous cell carcinoma of left tonsil stage IV HPV+.  Patient undergoing chemotherapy and radiation therapy.    Met with patient in clinic this pm.  Patient frustrated as pump keeps shutting off.  Planning to meet with nursing later today.  Patient had multiple lower jaw teeth pulled prior to starting therapy.  Reports a little sore throat but nothing severe.  Did try a doughnut this am and had a hard time swallowing it.  "I needed to drink something to get it down."  Noted has met with SLP.  Patient eating cereal, yogurt, mashed potatoes, macaroni and cheese, soft foods.  Drinking 1 ensure per day.  "I can't afford those drinks.  Received foods stamps.    Reports no issues with nausea, constipation of diarrhea.    Medications: reviewed  Labs: reviewed  Anthropometrics:   Weight has decreased to 175 lb 12 oz on 7/25 from 182 lb on 6/21.  4 % weight loss in 1 month   NUTRITION DIAGNOSIS: Predicted suboptimal nutrition continues   MALNUTRITION DIAGNOSIS: continue to monitor   INTERVENTION:  Reviewed foods high in calories and protein Encouraged patient to drink 2 ensure enlive shakes daily.  1st case of ensure enlive given today.      MONITORING, EVALUATION, GOAL: Patient will consume adequate calories and protein during treatment to maintain weight   NEXT VISIT: August 13 during infusion with Jim Like B. Zenia Resides, [place], [place] Registered Dietitian [phone] (pager)

## 2018-07-17 ENCOUNTER — Encounter (HOSPITAL_COMMUNITY): Payer: Self-pay

## 2018-07-19 ENCOUNTER — Inpatient Hospital Stay (HOSPITAL_COMMUNITY): Payer: Medicare Other

## 2018-07-19 ENCOUNTER — Other Ambulatory Visit: Payer: Self-pay

## 2018-07-19 ENCOUNTER — Encounter (HOSPITAL_COMMUNITY): Payer: Self-pay

## 2018-07-19 VITALS — BP 126/66 | HR 73 | Temp 98.4°F | Resp 18 | Wt 174.0 lb

## 2018-07-19 DIAGNOSIS — C099 Malignant neoplasm of tonsil, unspecified: Secondary | ICD-10-CM

## 2018-07-19 MED ORDER — DEXAMETHASONE SODIUM PHOSPHATE 10 MG/ML IJ SOLN
INTRAMUSCULAR | Status: AC
  Filled 2018-07-19: qty 1

## 2018-07-19 MED ORDER — DEXAMETHASONE SODIUM PHOSPHATE 10 MG/ML IJ SOLN
10.0000 mg | Freq: Once | INTRAMUSCULAR | Status: AC
  Administered 2018-07-19: 10 mg via INTRAVENOUS

## 2018-07-19 MED ORDER — SODIUM CHLORIDE 0.9 % IV SOLN
INTRAVENOUS | Status: DC
  Administered 2018-07-19: 09:00:00 via INTRAVENOUS

## 2018-07-19 MED ORDER — HEPARIN SOD (PORK) LOCK FLUSH 100 UNIT/ML IV SOLN
500.0000 [IU] | Freq: Once | INTRAVENOUS | Status: AC | PRN
  Administered 2018-07-19: 500 [IU]

## 2018-07-19 MED ORDER — SODIUM CHLORIDE 0.9 % IV SOLN
70.0000 mg/m2 | Freq: Once | INTRAVENOUS | Status: AC
  Administered 2018-07-19: 140 mg via INTRAVENOUS
  Filled 2018-07-19: qty 14

## 2018-07-19 NOTE — Progress Notes (Signed)
See today's infusion encounter

## 2018-07-19 NOTE — Progress Notes (Signed)
Tolerated infusion w/o adverse reaction.  Alert, in no distress.  VSS.  Discharged ambulatory.  

## 2018-07-21 ENCOUNTER — Telehealth (HOSPITAL_COMMUNITY): Payer: Self-pay | Admitting: *Deleted

## 2018-07-21 NOTE — Telephone Encounter (Signed)
Pt called for f/u post Cycle 1 of chemotherapy.  He denies any s/s, no complaints, and states, "I feel pretty good.".  Instructed to call the clinic with any questions or concerns.  Verbalizes understanding.

## 2018-07-26 ENCOUNTER — Ambulatory Visit (HOSPITAL_COMMUNITY): Payer: Self-pay

## 2018-07-26 ENCOUNTER — Other Ambulatory Visit (HOSPITAL_COMMUNITY): Payer: Self-pay | Admitting: *Deleted

## 2018-07-26 MED ORDER — SUCRALFATE 1 GM/10ML PO SUSP: ORAL | 2 refills | Status: DC

## 2018-07-26 NOTE — Progress Notes (Signed)
Patient called and left voicemail over the weekend about mouth pain/difficulty swallowing.  I returned his call this morning and he advised that his throat is sore 75/10 pain, his saliva tastes rancid.  He c/o his saliva being so thick he can hardly swallow it.  Patient denies any blisters or sores in his mouth it is just his throat is sore.    I called in carafate/lidocaine per standing orders.    Patient states that he is also going for radiation in [place] today.  He is going to talk with them as well but I advised for him to let him know what I have called in for him. He agrees.

## 2018-07-30 ENCOUNTER — Encounter (HOSPITAL_COMMUNITY): Payer: Self-pay

## 2018-08-02 ENCOUNTER — Inpatient Hospital Stay (HOSPITAL_BASED_OUTPATIENT_CLINIC_OR_DEPARTMENT_OTHER): Payer: Medicare Other | Admitting: Hematology

## 2018-08-02 ENCOUNTER — Inpatient Hospital Stay (HOSPITAL_COMMUNITY): Payer: Medicare Other | Attending: Hematology

## 2018-08-02 ENCOUNTER — Ambulatory Visit (HOSPITAL_COMMUNITY): Payer: Self-pay | Admitting: Internal Medicine

## 2018-08-02 ENCOUNTER — Encounter (HOSPITAL_COMMUNITY): Payer: Self-pay | Admitting: Hematology

## 2018-08-02 ENCOUNTER — Inpatient Hospital Stay (HOSPITAL_COMMUNITY): Payer: Medicare Other

## 2018-08-02 VITALS — BP 119/66 | HR 81 | Temp 98.4°F | Resp 16 | Wt 159.8 lb

## 2018-08-02 DIAGNOSIS — R5383 Other fatigue: Secondary | ICD-10-CM | POA: Insufficient documentation

## 2018-08-02 DIAGNOSIS — R630 Anorexia: Secondary | ICD-10-CM | POA: Diagnosis not present

## 2018-08-02 DIAGNOSIS — N189 Chronic kidney disease, unspecified: Secondary | ICD-10-CM | POA: Insufficient documentation

## 2018-08-02 DIAGNOSIS — Z87891 Personal history of nicotine dependence: Secondary | ICD-10-CM | POA: Insufficient documentation

## 2018-08-02 DIAGNOSIS — R634 Abnormal weight loss: Secondary | ICD-10-CM | POA: Diagnosis not present

## 2018-08-02 DIAGNOSIS — K1379 Other lesions of oral mucosa: Secondary | ICD-10-CM | POA: Insufficient documentation

## 2018-08-02 DIAGNOSIS — E785 Hyperlipidemia, unspecified: Secondary | ICD-10-CM | POA: Insufficient documentation

## 2018-08-02 DIAGNOSIS — I129 Hypertensive chronic kidney disease with stage 1 through stage 4 chronic kidney disease, or unspecified chronic kidney disease: Secondary | ICD-10-CM | POA: Diagnosis not present

## 2018-08-02 DIAGNOSIS — Z79899 Other long term (current) drug therapy: Secondary | ICD-10-CM | POA: Diagnosis not present

## 2018-08-02 DIAGNOSIS — C099 Malignant neoplasm of tonsil, unspecified: Secondary | ICD-10-CM | POA: Insufficient documentation

## 2018-08-02 DIAGNOSIS — Z7689 Persons encountering health services in other specified circumstances: Secondary | ICD-10-CM | POA: Diagnosis not present

## 2018-08-02 DIAGNOSIS — D701 Agranulocytosis secondary to cancer chemotherapy: Secondary | ICD-10-CM

## 2018-08-02 DIAGNOSIS — Z5111 Encounter for antineoplastic chemotherapy: Secondary | ICD-10-CM

## 2018-08-02 DIAGNOSIS — T451X5A Adverse effect of antineoplastic and immunosuppressive drugs, initial encounter: Secondary | ICD-10-CM

## 2018-08-02 LAB — COMPREHENSIVE METABOLIC PANEL
ALBUMIN: 3.8 g/dL (ref 3.5–5.0)
ALT: 17 U/L (ref 0–44)
ANION GAP: 12 (ref 5–15)
AST: 21 U/L (ref 15–41)
Alkaline Phosphatase: 58 U/L (ref 38–126)
BUN: 23 mg/dL (ref 8–23)
CO2: 33 mmol/L — ABNORMAL HIGH (ref 22–32)
Calcium: 9.4 mg/dL (ref 8.9–10.3)
Chloride: 95 mmol/L — ABNORMAL LOW (ref 98–111)
Creatinine, Ser: 1.03 mg/dL (ref 0.61–1.24)
GFR calc Af Amer: >60 mL/min (ref >60)
GFR calc non Af Amer: >60 mL/min (ref >60)
GLUCOSE: 120 mg/dL — AB (ref 70–99)
Potassium: 3.4 mmol/L — ABNORMAL LOW (ref 3.5–5.1)
Sodium: 140 mmol/L (ref 135–145)
Total Bilirubin: 1.3 mg/dL — ABNORMAL HIGH (ref 0.3–1.2)
Total Protein: 7.6 g/dL (ref 6.5–8.1)

## 2018-08-02 MED ORDER — MORPHINE SULFATE (CONCENTRATE) 10 MG /0.5 ML PO SOLN: 10.0000 mg | ORAL | 0 refills | Status: DC | PRN

## 2018-08-02 MED ORDER — SODIUM CHLORIDE 0.9 % IV SOLN
INTRAVENOUS | Status: DC
  Administered 2018-08-02: 11:00:00 via INTRAVENOUS

## 2018-08-02 MED ORDER — FILGRASTIM 300 MCG/0.5ML IJ SOSY
300.0000 ug | PREFILLED_SYRINGE | Freq: Once | INTRAMUSCULAR | Status: AC
  Administered 2018-08-02: 300 ug via SUBCUTANEOUS
  Filled 2018-08-02: qty 0.5

## 2018-08-02 NOTE — Patient Instructions (Addendum)
[facility] at [facility] Discharge Instructions  You saw Dr. Delton Coombes today. Please have labs drawn tomorrow. Follow up next week after you get your PEG tube for treatment and labs.    Thank you for choosing [facility] at [facility] to provide your oncology and hematology care.  To afford each patient quality time with our provider, please arrive at least 15 minutes before your scheduled appointment time.   If you have a lab appointment with the [facility] please come in thru the  Main Entrance and check in at the main information desk  You need to re-schedule your appointment should you arrive 10 or more minutes late.  We strive to give you quality time with our providers, and arriving late affects you and other patients whose appointments are after yours.  Also, if you no show three or more times for appointments you may be dismissed from the clinic at the providers discretion.     Again, thank you for choosing [facility].  Our hope is that these requests will decrease the amount of time that you wait before being seen by our physicians.       _____________________________________________________________  Should you have questions after your visit to [facility], please contact our office at [phone] between the hours of 8:00 a.m. and 4:30 p.m.  Voicemails left after 4:00 p.m. will not be returned until the following business day.  For prescription refill requests, have your pharmacy contact our office and allow 72 hours.    Cancer Center Support Programs:   > Cancer Support Group  2nd Tuesday of the month 1pm-2pm, Journey Room

## 2018-08-02 NOTE — Progress Notes (Signed)
[name], [place] [address]   CLINIC:  Medical Oncology/Hematology  PCP:  [name] Norlander, DO [place] [address] [phone]   REASON FOR VISIT:  Follow-up for tonsillar cancer  CURRENT THERAPY: Carboplatin and 5FU  BRIEF ONCOLOGIC HISTORY:    Malignant neoplasm of tonsil ([facility])   06/02/2018 Initial Diagnosis    Tonsillar cancer ([facility])    06/15/2018 -  Chemotherapy    The patient had palonosetron (ALOXI) injection 0.25 mg, 0.25 mg, Intravenous,  Once, 1 of 3 cycles Administration: 0.25 mg (07/13/2018), 0.25 mg (07/15/2018) CARBOplatin (PARAPLATIN) 140 mg in sodium chloride 0.9 % 100 mL chemo infusion, 70 mg/m2 = 140 mg (100 % of original dose 70 mg/m2), Intravenous,  Once, 1 of 3 cycles Dose modification: 70 mg/m2 (original dose 70 mg/m2, Cycle 1, Reason: Other (see comments), Comment: french protocol head and neck) Administration: 140 mg (07/13/2018), 140 mg (07/14/2018), 140 mg (07/15/2018), 140 mg (07/19/2018) fluorouracil (ADRUCIL) 4,750 mg in sodium chloride 0.9 % 55 mL chemo infusion, 600 mg/m2/day = 4,750 mg (100 % of original dose 600 mg/m2/day), Intravenous, 4D (96 hours ), 1 of 3 cycles Dose modification: 600 mg/m2/day (original dose 600 mg/m2/day, Cycle 1, Reason: Other (see comments), Comment: french protocol head and neck) Administration: 4,750 mg (07/13/2018)  for chemotherapy treatment.       CANCER STAGING: Cancer Staging Malignant neoplasm of tonsil ([facility]) Staging form: Pharynx - HPV-Mediated Oropharynx, AJCC 8th Edition - Clinical stage from 06/02/2018: Stage II (cT3, cN2, cM0) - Unsigned    INTERVAL HISTORY:  [name] 67 y.o. male returns for routine follow-up for tonsillar cancer. Patient has lost 16 pounds since the start of treatment. His appetite is 50% however he is unable to eat due to the sores in his mouth. He used the mouth rinse we prescribed however it is not taking the pain away enough for  him to eat food. He has been drinking 2 ensures daily to help supplement his nutrition. Patient lives at home alone with his dog. He is concerned about getting a PEG tube because he doesn't want to leave his dog home alone for very long. Patients energy level is at 50%.     REVIEW OF SYSTEMS:  Review of Systems  Constitutional: Positive for fatigue.  HENT:   Positive for mouth sores and trouble swallowing.   Eyes: Negative.   Respiratory: Negative.   Cardiovascular: Negative.   Gastrointestinal: Negative.   Endocrine: Negative.   Genitourinary: Negative.    Musculoskeletal: Negative.   Skin: Negative.   Neurological: Positive for numbness.  Hematological: Negative.   Psychiatric/Behavioral: Negative.      PAST MEDICAL/SURGICAL HISTORY:  Past Medical History:  Diagnosis Date  . HOH (hard of hearing)   . Hyperlipidemia   . Mass of oral cavity 05/12/2018  . Positive FIT (fecal immunochemical test) 05/12/2018  . PTSD (post-traumatic stress disorder)    has not been offically diagnosised  . Tonsillar cancer ([facility]) 06/02/2018   Past Surgical History:  Procedure Laterality Date  . APPENDECTOMY  1969  . COLONOSCOPY W/ POLYPECTOMY    . FINGER FRACTURE SURGERY Left   . MULTIPLE EXTRACTIONS WITH ALVEOLOPLASTY N/A 06/15/2018   Procedure: Extraction of tooth #'s 2,12,13,18,20,and 21 with alveoloplasty and gross debridement of remaining teeth;  Surgeon: Lenn Cal, DDS;  Location: [facility];  Service: Oral Surgery;  Laterality: N/A;  . PORTACATH PLACEMENT Left 07/02/2018   Procedure: INSERTION PORT-A-CATH;  Surgeon:  Aviva Signs, MD;  Location: [facility];  Service: General;  Laterality: Left;     SOCIAL HISTORY:  Social History   Socioeconomic History  . Marital status: Divorced    Spouse name: Not on file  . Number of children: Not on file  . Years of education: Not on file  . Highest education level: Not on file  Occupational History  . Occupation: English as a second language teacher, retired  Photographer  . Financial resource strain: Not on file  . Food insecurity:    Worry: Not on file    Inability: Not on file  . Transportation needs:    Medical: Not on file    Non-medical: Not on file  Tobacco Use  . Smoking status: Former Smoker    Years: 15.00    Types: Cigarettes    Last attempt to quit: 2000    Years since quitting: 19.6  . Smokeless tobacco: Never Used  Substance and Sexual Activity  . Alcohol use: Yes    Alcohol/week: 12.0 standard drinks    Types: 12 Cans of beer per week  . Drug use: Never  . Sexual activity: Not Currently  Lifestyle  . Physical activity:    Days per week: Not on file    Minutes per session: Not on file  . Stress: Not on file  Relationships  . Social connections:    Talks on phone: Not on file    Gets together: Not on file    Attends religious service: Not on file    Active member of club or organization: Not on file    Attends meetings of clubs or organizations: Not on file    Relationship status: Not on file  . Intimate partner violence:    Fear of current or ex partner: Not on file    Emotionally abused: Not on file    Physically abused: Not on file    Forced sexual activity: Not on file  Other Topics Concern  . 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  He lives independently with his dog here in [place].  He formally lived in [place].    FAMILY HISTORY:  Family History  Problem Relation Age of Onset  . Diabetes Father     CURRENT MEDICATIONS:  Outpatient Encounter Medications as of 08/02/2018  Medication Sig  . fluconazole (DIFLUCAN) 100 MG tablet Take by mouth.  Marland Kitchen ibuprofen (ADVIL,MOTRIN) 200 MG tablet Take 400 mg by mouth every 6 (six) hours as needed for moderate pain.   Marland Kitchen lidocaine-prilocaine (EMLA) cream Apply to affected area once  . oxyCODONE-acetaminophen (PERCOCET) 5-325 MG tablet Take one tablet by mouth every 4 hours as needed for pain.  Marland Kitchen  prochlorperazine (COMPAZINE) 10 MG tablet Take 1 tablet (10 mg total) by mouth every 6 (six) hours as needed (Nausea or vomiting).  . sodium fluoride (FLUORISHIELD) 1.1 % GEL dental gel Instill one drop of gel per tooth space of fluoride tray. Place over teeth for 5 minutes. Remove. Spit out excess. Repeat nightly.  . sucralfate (CARAFATE) 1 GM/10ML suspension Carafate 1gm/59ml & Viscous lidocaine 2% 1:1 mixture.  Swish and swallow 1 tablespoon four times a day.  . Morphine Sulfate (MORPHINE CONCENTRATE) 10 mg / 0.5 ml concentrated solution Take 0.5 mLs (10 mg total) by mouth every 3 (three) hours as needed for severe pain.   Facility-Administered Encounter Medications as of 08/02/2018  Medication  . 0.9 %  sodium  chloride infusion  . [COMPLETED] filgrastim (NEUPOGEN) injection 300 mcg    ALLERGIES:  No Known Allergies   PHYSICAL EXAM:  ECOG Performance status: 1  Vitals:   08/02/18 0957  BP: 119/66  Pulse: 81  Resp: 16  Temp: 98.4 F (36.9 C)  SpO2: 100%   Filed Weights   08/02/18 0957  Weight: 159 lb 12.8 oz (72.5 kg)    Physical Exam  Constitutional: He is oriented to person, place, and time.  Cardiovascular: Normal rate, regular rhythm and normal heart sounds.  Pulmonary/Chest: Effort normal and breath sounds normal.  Neurological: He is alert and oriented to person, place, and time.  Skin: Skin is warm and dry.   HEENT exam: There is ulceration and mucositis on the soft palate.  Patient unable to open the mouth wide.  Limited examination of the left tonsil shows decrease in size of the lesion.  Left neck lymphadenopathy has also slightly decreased.  LABORATORY DATA:  I have reviewed the labs as listed.  CBC    Component Value Date/Time   WBC 0.9 (LL) 08/02/2018 0934   RBC 4.34 08/02/2018 0934   HGB 13.5 08/02/2018 0934   HGB 14.2 05/12/2018 0949   HCT 40.3 08/02/2018 0934   HCT 42.0 05/12/2018 0949   PLT 73 (L) 08/02/2018 0934   PLT 256 05/12/2018 0949   MCV  92.9 08/02/2018 0934   MCV 87 05/12/2018 0949   MCH 31.1 08/02/2018 0934   MCHC 33.5 08/02/2018 0934   RDW 13.4 08/02/2018 0934   RDW 13.0 05/12/2018 0949   LYMPHSABS 0.4 (L) 08/02/2018 0934   LYMPHSABS 1.1 05/12/2018 0949   MONOABS 0.2 08/02/2018 0934   EOSABS 0.0 08/02/2018 0934   EOSABS 0.1 05/12/2018 0949   BASOSABS 0.0 08/02/2018 0934   BASOSABS 0.0 05/12/2018 0949   CMP Latest Ref Rng & Units 08/02/2018 07/12/2018 06/15/2018  Glucose 70 - 99 mg/dL 120(H) 110(H) 107(H)  BUN 8 - 23 mg/dL 23 10 13   Creatinine 0.61 - 1.24 mg/dL 1.03 1.12 1.12  Sodium 135 - 145 mmol/L 140 139 140  Potassium 3.5 - 5.1 mmol/L 3.4(L) 4.6 3.7  Chloride 98 - 111 mmol/L 95(L) 102 107  CO2 22 - 32 mmol/L 33(H) 31 23  Calcium 8.9 - 10.3 mg/dL 9.4 9.1 8.7(L)  Total Protein 6.5 - 8.1 g/dL 7.6 7.2 -  Total Bilirubin 0.3 - 1.2 mg/dL 1.3(H) 0.9 -  Alkaline Phos 38 - 126 U/L 58 65 -  AST 15 - 41 U/L 21 19 -  ALT 0 - 44 U/L 17 16 -         ASSESSMENT & PLAN:   Malignant neoplasm of tonsil ([facility]) 1.  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 Benjamine Mola on 05/13/2018, status post biopsy consistent with squamous cell carcinoma, P 16+ -Weight loss of 5 pounds in the last 1 month - Baseline hearing loss and chronic kidney disease with creatinine of 1.34. - Oropharyngeal examination shows a necrotic left tonsillar mass, not clearly visualized secondary to increased gag reflex.  There is a left lower neck lymph node palpable.  I discussed the results of the PET CT scan with the patient which showed level 2, 3, 5 meta stasis on the left neck and mildly metabolic right level 2 lymph node along with the left tonsillar primary.  No other hypermetabolic lesion seen. -I have recommended combination chemoradiation therapy.  He is not a candidate for high-dose cisplatin given his chronic kidney disease and hearing  dysfunction.  He is also not a candidate for weekly cisplatin as his creatinine might get worse.  I have recommended 94-01 [place] protocol with 5-FU administered as a 24-hour continuous infusion at a dose of 600 mg per M square per day for 4 days and carboplatin given as a daily bolus of 70 mg/m2/day for 4 days.  Chemo will be given on days 1, 22 and 43.  We will have a port placed by 1 of our surgeons. - He received first cycle of carboplatin and 5-FU on 07/13/2018. - He has developed severe mucositis and lost about 16 pounds since the start of therapy.  He is using Maalox with lidocaine which is not completely helping him.  I will start him on morphine liquid 10 mg to be taken every 2-3 hours. - Because of his severe weight loss, I have recommended PEG tube placement.  Dr.Yanagihara has also discussed this with me.  We will make arrangements for Dr. Arnoldo Morale to put a PEG tube in this Wednesday. - Patient has slight difficulty opening his mouth wide.  This has happened in the last 1 week.  Lymph nodes in the left neck have decreased in size.  Limited examination of the left tonsil also showed decrease in size of the lesion.  There is a lot of mucositis on the palate. -His white count is severely low today.  Hence I will delay his chemotherapy by 1 week.  Today he will receive Neupogen.  We will repeat his CBC tomorrow.  He will also receive fluids today and tomorrow.      Orders placed this encounter:  Orders Placed This Encounter  Procedures  . CBC with Differential/Platelet  . Comprehensive metabolic panel  . CBC with Differential/Platelet  . CBC with Differential/Platelet  . Comprehensive metabolic panel   Total time spent is 40 minutes with more than 50% of the time spent face-to-face discussing lab work, need for Neupogen and feeding tube and coordination of care.   Derek Jack, MD [place] [phone]

## 2018-08-02 NOTE — Patient Instructions (Signed)
[place] at [facility] Discharge Instructions  536ml of hydration fluids today , neupagen given Follow up as scheduled.   Thank you for choosing [place] at [facility] to provide your oncology and hematology care.  To afford each patient quality time with our provider, please arrive at least 15 minutes before your scheduled appointment time.   If you have a lab appointment with the [facility] please come in thru the  Main Entrance and check in at the main information desk  You need to re-schedule your appointment should you arrive 10 or more minutes late.  We strive to give you quality time with our providers, and arriving late affects you and other patients whose appointments are after yours.  Also, if you no show three or more times for appointments you may be dismissed from the clinic at the providers discretion.     Again, thank you for choosing [facility]  Our hope is that these requests will decrease the amount of time that you wait before being seen by our physicians.       _____________________________________________________________  Should you have questions after your visit to [facility], please contact our office at [phone] between the hours of 8:00 a.m. and 4:30 p.m.  Voicemails left after 4:00 p.m. will not be returned until the following business day.  For prescription refill requests, have your pharmacy contact our office and allow 72 hours.    Cancer Center Support Programs:   > Cancer Support Group  2nd Tuesday of the month 1pm-2pm, Journey Room

## 2018-08-02 NOTE — Assessment & Plan Note (Signed)
1.  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 Benjamine Mola on 05/13/2018, status post biopsy consistent with squamous cell carcinoma, P 16+ -Weight loss of 5 pounds in the last 1 month - Baseline hearing loss and chronic kidney disease with creatinine of 1.34. - Oropharyngeal examination shows a necrotic left tonsillar mass, not clearly visualized secondary to increased gag reflex.  There is a left lower neck lymph node palpable.  I discussed the results of the PET CT scan with the patient which showed level 2, 3, 5 meta stasis on the left neck and mildly metabolic right level 2 lymph node along with the left tonsillar primary.  No other hypermetabolic lesion seen. -I have recommended combination chemoradiation therapy.  He is not a candidate for high-dose cisplatin given his chronic kidney disease and hearing dysfunction.  He is also not a candidate for weekly cisplatin as his creatinine might get worse.  I have recommended 94-01 [place] protocol with 5-FU administered as a 24-hour continuous infusion at a dose of 600 mg per M square per day for 4 days and carboplatin given as a daily bolus of 70 mg/m2/day for 4 days.  Chemo will be given on days 1, 22 and 43.  We will have a port placed by 1 of our surgeons. - He received first cycle of carboplatin and 5-FU on 07/13/2018. - He has developed severe mucositis and lost about 16 pounds since the start of therapy.  He is using Maalox with lidocaine which is not completely helping him.  I will start him on morphine liquid 10 mg to be taken every 2-3 hours. - Because of his severe weight loss, I have recommended PEG tube placement.  Dr.Yanagihara has also discussed this with me.  We will make arrangements for Dr. Arnoldo Morale to put a PEG tube in this Wednesday. - Patient has slight difficulty opening his mouth wide.  This has happened in the last 1 week.  Lymph  nodes in the left neck have decreased in size.  Limited examination of the left tonsil also showed decrease in size of the lesion.  There is a lot of mucositis on the palate. -His white count is severely low today.  Hence I will delay his chemotherapy by 1 week.  Today he will receive Neupogen.  We will repeat his CBC tomorrow.  He will also receive fluids today and tomorrow.

## 2018-08-02 NOTE — Progress Notes (Signed)
Will hold treatment today per Dr. Delton Coombes. Patient will see surgeon tomorrow and Feeding tube placed on Wednesday. Will give fluids and recheck labs tomorrow, possible neupagen per MD.   Lyndal Pulley given today per orders. Patient tolerated it well without problems. Vitals stable and discharged home from clinic ambulatory. Follow up as scheduled.

## 2018-08-03 ENCOUNTER — Encounter (HOSPITAL_COMMUNITY): Payer: Self-pay | Admitting: Dietician

## 2018-08-03 ENCOUNTER — Ambulatory Visit (INDEPENDENT_AMBULATORY_CARE_PROVIDER_SITE_OTHER): Payer: Self-pay | Admitting: General Surgery

## 2018-08-03 ENCOUNTER — Other Ambulatory Visit (HOSPITAL_COMMUNITY): Payer: Self-pay

## 2018-08-03 ENCOUNTER — Encounter (HOSPITAL_COMMUNITY): Payer: Self-pay | Admitting: General Practice

## 2018-08-03 ENCOUNTER — Ambulatory Visit (HOSPITAL_COMMUNITY): Payer: Self-pay

## 2018-08-03 ENCOUNTER — Encounter (HOSPITAL_COMMUNITY)
Admission: RE | Admit: 2018-08-03 | Discharge: 2018-08-03 | Disposition: A | Discharge status: Home / Self Care | Payer: Medicare Other | Source: Ambulatory Visit | Attending: General Surgery | Admitting: General Surgery

## 2018-08-03 ENCOUNTER — Inpatient Hospital Stay (HOSPITAL_COMMUNITY): Payer: Medicare Other

## 2018-08-03 ENCOUNTER — Encounter: Payer: Self-pay | Admitting: General Practice

## 2018-08-03 ENCOUNTER — Encounter (HOSPITAL_COMMUNITY): Payer: Self-pay

## 2018-08-03 ENCOUNTER — Encounter: Payer: Self-pay | Admitting: General Surgery

## 2018-08-03 VITALS — BP 133/74 | HR 77 | Temp 97.8°F | Resp 20 | Wt 158.0 lb

## 2018-08-03 DIAGNOSIS — C099 Malignant neoplasm of tonsil, unspecified: Secondary | ICD-10-CM

## 2018-08-03 LAB — CBC WITH DIFFERENTIAL/PLATELET
BASOS ABS: 0 10*3/uL (ref 0.0–0.1)
BASOS PCT: 0 %
Band Neutrophils: 0 %
Basophils Absolute: 0 10*3/uL (ref 0.0–0.1)
Basophils Relative: 0 %
Blasts: 0 %
EOS ABS: 0 10*3/uL (ref 0.0–0.7)
Eosinophils Absolute: 0 10*3/uL (ref 0.0–0.7)
Eosinophils Relative: 5 %
Eosinophils Relative: 1 %
HCT: 40.3 % (ref 39.0–52.0)
HCT: 38.8 % — ABNORMAL LOW (ref 39.0–52.0)
HEMOGLOBIN: 13.5 g/dL (ref 13.0–17.0)
HEMOGLOBIN: 12.9 g/dL — AB (ref 13.0–17.0)
LYMPHS PCT: 39 %
Lymphocytes Relative: 7 %
Lymphs Abs: 0.4 10*3/uL — ABNORMAL LOW (ref 0.7–4.0)
Lymphs Abs: 0.4 10*3/uL — ABNORMAL LOW (ref 0.7–4.0)
MCH: 31.1 pg (ref 26.0–34.0)
MCH: 31.2 pg (ref 26.0–34.0)
MCHC: 33.5 g/dL (ref 30.0–36.0)
MCHC: 33.2 g/dL (ref 30.0–36.0)
MCV: 92.9 fL (ref 78.0–100.0)
MCV: 93.7 fL (ref 78.0–100.0)
Metamyelocytes Relative: 0 %
Monocytes Absolute: 0.2 10*3/uL (ref 0.1–1.0)
Monocytes Absolute: 0.5 10*3/uL (ref 0.1–1.0)
Monocytes Relative: 19 %
Monocytes Relative: 9 %
Myelocytes: 0 %
NEUTROS ABS: 4.4 10*3/uL (ref 1.7–7.7)
NEUTROS PCT: 37 %
NEUTROS PCT: 83 %
NRBC: 0 /100{WBCs}
Neutro Abs: 0.3 10*3/uL — ABNORMAL LOW (ref 1.7–7.7)
OTHER: 0 %
PROMYELOCYTES RELATIVE: 0 %
Platelets: 73 10*3/uL — ABNORMAL LOW (ref 150–400)
Platelets: 73 10*3/uL — ABNORMAL LOW (ref 150–400)
RBC: 4.34 MIL/uL (ref 4.22–5.81)
RBC: 4.14 MIL/uL — AB (ref 4.22–5.81)
RDW: 13.4 % (ref 11.5–15.5)
RDW: 13.8 % (ref 11.5–15.5)
WBC: 0.9 10*3/uL — CL (ref 4.0–10.5)
WBC: 5.3 10*3/uL (ref 4.0–10.5)

## 2018-08-03 LAB — COMPREHENSIVE METABOLIC PANEL
ALK PHOS: 57 U/L (ref 38–126)
ALT: 19 U/L (ref 0–44)
ANION GAP: 12 (ref 5–15)
AST: 22 U/L (ref 15–41)
Albumin: 3.6 g/dL (ref 3.5–5.0)
BUN: 24 mg/dL — ABNORMAL HIGH (ref 8–23)
CALCIUM: 9.1 mg/dL (ref 8.9–10.3)
CO2: 29 mmol/L (ref 22–32)
CREATININE: 0.99 mg/dL (ref 0.61–1.24)
Chloride: 98 mmol/L (ref 98–111)
Glucose, Bld: 87 mg/dL (ref 70–99)
Potassium: 3.3 mmol/L — ABNORMAL LOW (ref 3.5–5.1)
Sodium: 139 mmol/L (ref 135–145)
Total Bilirubin: 1.8 mg/dL — ABNORMAL HIGH (ref 0.3–1.2)
Total Protein: 7.1 g/dL (ref 6.5–8.1)

## 2018-08-03 MED ORDER — HEPARIN SOD (PORK) LOCK FLUSH 100 UNIT/ML IV SOLN
INTRAVENOUS | Status: AC
  Filled 2018-08-03: qty 5

## 2018-08-03 MED ORDER — LACTATED RINGERS IV SOLN: INTRAVENOUS | Status: DC

## 2018-08-03 MED ORDER — SODIUM CHLORIDE 0.9% FLUSH
10.0000 mL | Freq: Once | INTRAVENOUS | Status: AC
  Administered 2018-08-03: 10 mL via INTRAVENOUS

## 2018-08-03 MED ORDER — PROMETHAZINE HCL 25 MG/ML IJ SOLN: 6.2500 mg | INTRAMUSCULAR | Status: DC | PRN

## 2018-08-03 MED ORDER — MEPERIDINE HCL 50 MG/ML IJ SOLN: 6.2500 mg | INTRAMUSCULAR | Status: DC | PRN

## 2018-08-03 MED ORDER — SODIUM CHLORIDE 0.9 % IV SOLN
INTRAVENOUS | Status: DC
  Administered 2018-08-03: 11:00:00 via INTRAVENOUS

## 2018-08-03 MED ORDER — HEPARIN SOD (PORK) LOCK FLUSH 100 UNIT/ML IV SOLN
500.0000 [IU] | Freq: Once | INTRAVENOUS | Status: AC
  Administered 2018-08-03: 500 [IU] via INTRAVENOUS

## 2018-08-03 MED ORDER — HYDROCODONE-ACETAMINOPHEN 7.5-325 MG PO TABS: 1.0000 | ORAL_TABLET | Freq: Once | ORAL | Status: DC | PRN

## 2018-08-03 MED ORDER — HYDROMORPHONE HCL 1 MG/ML IJ SOLN: 0.2500 mg | INTRAMUSCULAR | Status: DC | PRN

## 2018-08-03 NOTE — Progress Notes (Signed)
[facility] Progress Notes  Referral received from RN, patient has multiple financial and transportation needs.  Met w patient to discuss his situation.  Lives alone, has dog for companionship.  Values his independence and ability to have pet.  Struggling to afford cost of gas for multiple appointments - willing to accept volunteer driver. Referral sent to [facility] for help w volunteer transporation.  Patient wants to drive himself to daily radiation treatments in [place] - would like volunteer to transport to/from [place] to reduce gas costs.  Patient also working on getting help from [facility], encouraged to complete process of document submission.  [name] working on additional resources for [facility].  CSW will also investigate possible options for patient assistance.  CSW did contact [facility] - per [facility], patient is not eligible for significant help w transport from the [place] system.  She did recommend [facility] [phone]) as an option.  [facility] has contacted patient to discuss resources - per patient, he has been very fatigued and is aware of her call but has not responded to date.  Feels [facility] when he has more energy.  Edwyna Shell, LCSW Clinical Social Worker Phone:  [phone]

## 2018-08-03 NOTE — Patient Instructions (Signed)
PEG Tube Home Guide A percutaneous endoscopic gastrostomy (PEG) tube is used to deliver food and fluids directly into the stomach. The tube has a clamp, a cap, and two anchors (bolsters). One bolster keeps the tube from coming out of the stomach. The other bolster holds the tube against the abdomen. You will be taught how to use and adjust your PEG tube before you leave the hospital. You will also be taught how to care for the opening (stoma) in your abdomen. Make sure that you understand:  How to care for your PEG tube.  How to care for your stoma.  How to give yourself feedings and medicines.  When to call your health care provider for help.  How do I care for my peg tube? Check your PEG tube every day. Make sure:  It is not too tight. The bolster should rest gently over the stoma.  It is in the right position. There is a Rubye Strohmeyer on the tube that shows when it is in the right position. Adjust the tube if you need to.  How do I care for my stoma? Clean your stoma every day. Follow these steps: 1. Wash your hands with soap and water. 2. Check your stoma for redness, leaking, or skin irritation. 3. Wash the stoma gently with warm, soapy water. 4. Rinse the stoma with warm water. 5. Pat the stoma area dry. You may place a gauze pad over the opening between the outer bolster and your stoma.  How do I give myself nutritional formula? Your health care provider will give you instructions about:  How much nutrition and fluid you will need for each feeding.  How often to have a feeding.  Whether to take medicine in the tube by itself or with a feeding.  To give yourself a feeding, follow these steps: 1. Lay out all of the equipment that you will need. 2. Make sure that the nutritional formula is at room temperature. 3. Wash your hands with soap and water. 4. Position yourself so that you are upright. You will need to stay upright throughout the feeding and for at least 30 minutes after  the feeding. 5. Make sure the syringe plunger is pushed in. Place the tip of the syringe in clear water, and slowly pull the plunger to bring (draw up) the water into the syringe. 6. Remove the clamp and the cap from the PEG tube. 7. Push the water out of the syringe to clean (flush) the tube. 8. If the tube is clear, draw up the formula into the syringe. Make sure to use the right amount for each feeding and add water if necessary. 9. Slowly push the formula from the syringe through the tube. 10. After the feeding, flush the tube with water. 11. Put the clamp and the cap on the tube.  How do I give myself medicine? To give yourself medicine, follow these steps: 1. Lay out all of the equipment that you will need. 2. If your medicine is in tablet form, crush the tablet and dissolve it in water. 3. Wash your hands with soap and water. 4. Position yourself so that you are upright. You will need to stay upright while you give yourself medicine and for at least 30 minutes afterward. 5. Make sure the syringe plunger is pushed in. Place the tip of the syringe in clear water, and slowly pull the plunger to bring (draw up) the water into the syringe. 6. Remove the clamp and the cap from  the PEG tube. 7. Push the water out of the syringe to clean (flush) the tube. 8. If the tube is clear, draw up the medicine into the syringe. 9. Slowly push the medicine from the syringe through the tube. 10. Flush the tube with water. 11. Put the clamp and the cap on the tube.  You should not take sustained release (SR) medicines through your tube. If you are unsure if your medicine is a SR medicine, ask your health care provider or pharmacist. Contact a health care provider if:  You have soreness, redness, or irritation around your stoma.  You have abdominal pain or bloating during or after your feedings.  You have nausea, constipation, or diarrhea that will not go away.  You have a fever.  You have  problems with your PEG tube. Get help right away if:  Your tube is blocked.  Your tube falls out.  You have pain around your tube.  You are bleeding from your tube.  Your tube is leaking.  You choke or you have trouble breathing during or after a feeding. This information is not intended to replace advice given to you by your health care provider. Make sure you discuss any questions you have with your health care provider. Document Released: 04/24/2015 Document Revised: 05/12/2016 Document Reviewed: 12/13/2014 Elsevier Interactive Patient Education  [employer].

## 2018-08-03 NOTE — Progress Notes (Signed)
[name] tolerated hydration without incident or complaint. Labs reviewed with Dr. Delton Coombes and per MD, patient to follow up Monday as previously scheduled. Patient given information and instructions for PEG tube placement tomorrow. Verbalized understanding. VSS upon discharge. Discharged self ambulatory in satisfactory condition.

## 2018-08-03 NOTE — Progress Notes (Signed)
[name]; [id number]; 1951/06/07   HPI Patient is a 67 year old white male with a history of tonsillar cancer who now is referred to my care by Dr. Ellin Saba for a PEG.  I just put a Port-A-Cath in him in July of this year.  He has developed mucositis and is having difficulty keeping his nutrition up.  It was stated that his tonsillar lesion has decreased in size.  He has pain with eating food.  He currently has 0 out of 10 pain. Past Medical History:  Diagnosis Date  . HOH (hard of hearing)   . Hyperlipidemia   . Mass of oral cavity 05/12/2018  . Positive FIT (fecal immunochemical test) 05/12/2018  . PTSD (post-traumatic stress disorder)    has not been offically diagnosised  . Tonsillar cancer (HCC) 06/02/2018    Past Surgical History:  Procedure Laterality Date  . APPENDECTOMY  1969  . COLONOSCOPY W/ POLYPECTOMY    . FINGER FRACTURE SURGERY Left   . MULTIPLE EXTRACTIONS WITH ALVEOLOPLASTY N/A 06/15/2018   Procedure: Extraction of tooth #'s 2,12,13,18,20,and 21 with alveoloplasty and gross debridement of remaining teeth;  Surgeon: Charlynne Pander, DDS;  Location: [facility] OR;  Service: Oral Surgery;  Laterality: N/A;  . PORTACATH PLACEMENT Left 07/02/2018   Procedure: INSERTION PORT-A-CATH;  Surgeon: Franky Macho, MD;  Location: [facility]S;  Service: General;  Laterality: Left;    Family History  Problem Relation Age of Onset  . Diabetes Father     Current Outpatient Medications on File Prior to Visit  Medication Sig Dispense Refill  . ibuprofen (ADVIL,MOTRIN) 200 MG tablet Take 400 mg by mouth every 6 (six) hours as needed for moderate pain.     Marland Kitchen lidocaine-prilocaine (EMLA) cream Apply to affected area once 30 g 3  . Morphine Sulfate (MORPHINE CONCENTRATE) 10 mg / 0.5 ml concentrated solution Take 0.5 mLs (10 mg total) by mouth every 3 (three) hours as needed for severe pain. 60 mL 0  . oxyCODONE-acetaminophen (PERCOCET) 5-325 MG tablet Take one tablet by mouth every 4 hours as  needed for pain. 30 tablet 0  . prochlorperazine (COMPAZINE) 10 MG tablet Take 1 tablet (10 mg total) by mouth every 6 (six) hours as needed (Nausea or vomiting). 30 tablet 1  . sodium fluoride (FLUORISHIELD) 1.1 % GEL dental gel Instill one drop of gel per tooth space of fluoride tray. Place over teeth for 5 minutes. Remove. Spit out excess. Repeat nightly. 120 mL prn  . sucralfate (CARAFATE) 1 GM/10ML suspension Carafate 1gm/6ml & Viscous lidocaine 2% 1:1 mixture.  Swish and swallow 1 tablespoon four times a day. 360 mL 2  . fluconazole (DIFLUCAN) 100 MG tablet Take by mouth.     No current facility-administered medications on file prior to visit.     No Known Allergies  Social History   Substance and Sexual Activity  Alcohol Use Yes  . Alcohol/week: 12.0 standard drinks  . Types: 12 Cans of beer per week    Social History   Tobacco Use  Smoking Status Former Smoker  . Years: 15.00  . Types: Cigarettes  . Last attempt to quit: 2000  . Years since quitting: 19.6  Smokeless Tobacco Never Used    Review of Systems  Constitutional: Positive for weight loss.  HENT: Positive for sinus pain and sore throat.   Eyes: Negative.   Respiratory: Negative.   Cardiovascular: Negative.   Gastrointestinal: Negative.   Genitourinary: Negative.   Musculoskeletal: Negative.   Skin: Negative.  Neurological: Negative.   Endo/Heme/Allergies: Negative.   Psychiatric/Behavioral: Negative.     Objective   Vitals:   08/03/18 0927  BP: 133/74  Pulse: 77  Resp: 20  Temp: 97.8 F (36.6 C)    Physical Exam  Constitutional: He is oriented to person, place, and time. He appears well-developed and well-nourished. No distress.  HENT:  Head: Normocephalic and atraumatic.  No obvious mass lesion noted  Neck: Normal range of motion. Neck supple.  Cardiovascular: Normal rate, regular rhythm and normal heart sounds. Exam reveals no gallop and no friction rub.  No murmur  heard. Pulmonary/Chest: Effort normal and breath sounds normal. No stridor. No respiratory distress. He has no wheezes. He has no rales.  Abdominal: Soft. Bowel sounds are normal. He exhibits no distension and no mass. There is no tenderness. There is no guarding.  Lymphadenopathy:    He has cervical adenopathy.  Neurological: He is alert and oriented to person, place, and time.  Skin: Skin is warm and dry.  Vitals reviewed.  Oncology notes reviewed Assessment  Dysphasia, tonsillar carcinoma Plan   Patient is scheduled for a PEG tomorrow.  The risks and benefits of the procedure including bleeding, infection, and the possibility of an open procedure were fully explained to the patient, who gave informed consent.

## 2018-08-03 NOTE — Patient Instructions (Signed)
[name]  08/03/2018     @PREFPERIOPPHARMACY @   Your procedure is scheduled on  08/04/2018   Report to [facility] at  830   A.M.  Call this number if you have problems the morning of surgery:  [phone]   Remember:  Do not eat or drink after midnight.  You may drink clear liquids until 12 midnight 08/03/2018 .  Clear liquids allowed are:                    Water, Juice (non-citric and without pulp), Carbonated beverages, Clear Tea, Black Coffee only, Plain Jell-O only, Gatorade and Plain Popsicles only    Take these medicines the morning of surgery with A SIP OF WATER  Morphine or oxycodone, compazine (if needed).    Do not wear jewelry, make-up or nail polish.  Do not wear lotions, powders, or perfumes, or deodorant.  Do not shave 48 hours prior to surgery.  Men may shave face and neck.  Do not bring valuables to the hospital.  [facility] is not responsible for any belongings or valuables.  Contacts, dentures or bridgework may not be worn into surgery.  Leave your suitcase in the car.  After surgery it may be brought to your room.  For patients admitted to the hospital, discharge time will be determined by your treatment team.  Patients discharged the day of surgery will not be allowed to drive home.   Name and phone number of your driver:   family Special instructions:  None  Please read over the following fact sheets that you were given. Anesthesia Post-op Instructions and Care and Recovery After Surgery       Care of a Feeding Tube Feeding tubes are often given to those who have trouble swallowing or cannot take food or medicine. A feeding tube can:  Go into the nose and down to the stomach.  Go through the skin in the belly (abdomen) and into the stomach or small bowel.  Supplies needed to care for the tube site:  Clean gloves.  Clean wash cloth, gauze pads, or soft paper towel.  Cotton swabs.  Skin barrier ointment or  cream.  Soap and water.  Precut foam pads or gauze (that go around the tube).  Tube tape. Tube site care 1. Have all supplies ready. 2. Wash hands well. 3. Put on clean gloves. 4. Remove dirty foam pads or gauze near the tube site, if present. 5. Check the skin around the tube site for redness, rash, puffiness (swelling), leaking fluid, or extra tissue growth. Call your doctor if you see any of these. 6. Wet the gauze and cotton swabs with water and soap. 7. Wipe the area closest to the tube with cotton swabs. Wipe the surrounding skin with moistened gauze. Rinse with water. 8. Dry the skin and tube site with a dry gauze pad or soft paper towel. Do not use antibiotic ointments at the tube site. 9. If the skin is red, apply petroleum jelly in a circular motion, using a cotton swab. Your doctor may suggest a different cream or ointment. Use what the doctor suggests. 10. Apply a new pre-cut foam pad or gauze around the tube. Tape the edges down. Foam pads or gauze may be left off if there is no fluid at the tube site. 11. Use tape or a device that will attach your feeding tube to your skin  or do as directed. Rotate where you tape the tube. 12. Sit the person up. 13. Throw away used supplies. 14. Remove gloves. 15. Wash hands. Supplies needed to flush a feeding tube:  Clean gloves.  60 mL syringe (that connects to feeding tube).  Towel.  Water. Flushing a feeding tube 1. Have all supplies ready. 2. Wash hands well. 3. Put on clean gloves. 4. Pull 30 mL of water into the syringe. 5. Bend (kink) the feeding tube while disconnecting it from the feeding-bag tubing or while removing the plug at the end of the tube. 6. Insert the tip of the syringe into the end of the feeding tube. Stop bending the tube. Slowly inject the water. 7. If you cannot inject the water, the person with the feeding tube should lay on their left side. ? Do not use a syringe smaller than 60 mL to flush the  tubing. ? Do not inject the water with force. 8. After injecting the water, remove the syringe. 9. Always flush the tube before giving the first medicine, between medicines, and after the final medicine before starting a feeding. ? Do not mix medicines with liquid food (formula) before giving medicines. ? Do not mix medicines with other medicines before giving medicines. ? Completely flush medicines through the tube so they do not mix with the liquid food. 10. Throw away used supplies. 11. Remove gloves. 12. Wash hands. This information is not intended to replace advice given to you by your health care provider. Make sure you discuss any questions you have with your health care provider. Document Released: 09/01/2012 Document Revised: 05/15/2016 Document Reviewed: 07/22/2012 Elsevier Interactive Patient Education  2017 [place].  Gastrostomy Tube Home Guide, Adult A gastrostomy tube is a tube that is surgically placed into the stomach. It is also called a "G-tube." G-tubes are used when a person is unable to eat and drink enough on their own to stay healthy. The tube is inserted into the stomach through a small cut (incision) in the skin. This tube is used for:  Feeding.  Giving medication.  Gastrostomy tube care  Wash your hands with soap and water.  Remove the old dressing (if any). Some styles of G-tubes may need a dressing inserted between the skin and the G-tube. Other types of G-tubes do not require a dressing. Ask your health care provider if a dressing is needed.  Check the area where the tube enters the skin (insertion site) for redness, swelling, or pus-like (purulent) drainage. A small amount of clear or tan liquid drainage is normal. Check to make sure scar tissue (skin) is not growing around the insertion site. This could have a raised, bumpy appearance.  A cotton swab can be used to clean the skin around the tube: ? When the G-tube is first put in, a normal saline  solution or water can be used to clean the skin. ? Mild soap and warm water can be used when the skin around the G-tube site has healed. ? Roll the cotton swab around the G-tube insertion site to remove any drainage or crusting at the insertion site. Stomach residuals Feeding tube residuals are the amount of liquids that are in the stomach at any given time. Residuals may be checked before giving feedings, medications, or as instructed by your health care provider.  Ask your health care provider if there are instances when you would not start tube feedings depending on the amount or type of contents withdrawn from the stomach.  Check residuals by attaching a syringe to the G-tube and pulling back on the syringe plunger. Note the amount, and return the residual back into the stomach.  Flushing the G-tube  The G-tube should be periodically flushed with clean warm water to keep it from clogging. ? Flush the G-tube after feedings or medications. Draw up 30 mL of warm water in a syringe. Connect the syringe to the G-tube and slowly push the water into the tube. ? Do not push feedings, medications, or flushes rapidly. Flush the G-tube gently and slowly. ? Only use syringes made for G-tubes to flush medications or feedings. ? Your health care provider may want the G-tube flushed more often or with more water. If this is the case, follow your health care provider's instructions. Feedings Your health care provider will determine whether feedings are given as a bolus (a certain amount given at one time and at scheduled times) or whether feedings will be given continuously on a feeding pump.  Formulas should be given at room temperature.  If feedings are continuous, no more than 4 hours worth of feedings should be placed in the feeding bag. This helps prevent spoilage or accidental excess infusion.  Cover and place unused formula in the refrigerator.  If feedings are continuous, stop the feedings when  medications or flushes are given. Be sure to restart the feedings.  Feeding bags and syringes should be replaced as instructed by your health care provider.  Giving medication  In general, it is best if all medications are in a liquid form for G-tube administration. Liquid medications are less likely to clog the G-tube. ? Mix the liquid medication with 30 mL (or amount recommended by your health care provider) of warm water. ? Draw up the medication into the syringe. ? Attach the syringe to the G-tube and slowly push the mixture into the G-tube. ? After giving the medication, draw up 30 mL of warm water in the syringe and slowly flush the G-tube.  For pills or capsules, check with your health care provider first before crushing medications. Some pills are not effective if they are crushed. Some capsules are sustained-release medications. ? If appropriate, crush the pill or capsule and mix with 30 mL of warm water. Using the syringe, slowly push the medication through the tube, then flush the tube with another 30 mL of tap water. G-tube problems G-tube was pulled out.  Cause: May have been pulled out accidentally.  Solutions: Cover the opening with clean dressing and tape. Call your health care provider right away. The G-tube should be put in as soon as possible (within 4 hours) so the G-tube opening (tract) does not close. The G-tube needs to be put in at a health care setting. An X-ray needs to be done to confirm placement before the G-tube can be used again.  Redness, irritation, soreness, or foul odor around the gastrostomy site.  Cause: May be caused by leakage or infection.  Solutions: Call your health care provider right away.  Large amount of leakage of fluid or mucus-like liquid present (a large amount means it soaks clothing).  Cause: Many reasons could cause the G-tube to leak.  Solutions: Call your health care provider to discuss the amount of leakage.  Skin or scar tissue  appears to be growing where tube enters skin.  Cause: Tissue growth may develop around the insertion site if the G-tube is moved or pulled on excessively.  Solutions: Secure tube with tape so that excess movement  does not occur. Call your health care provider.  G-tube is clogged.  Cause: Thick formula or medication.  Solutions: Try to slowly push warm water into the tube with a large syringe. Never try to push any object into the tube to unclog it. Do not force fluid into the G-tube. If you are unable to unclog the tube, call your health care provider right away.  Tips  Head of bed (HOB) position refers to the upright position of a person's upper body. ? When giving medications or a feeding bolus, keep the [facility] up as told by your health care provider. Do this during the feeding and for 1 hour after the feeding or medication administration. ? If continuous feedings are being given, it is best to keep the [facility] up as told by your health care provider. When ADLs (activities of daily living) are performed and the [facility] needs to be flat, be sure to turn the feeding pump off. Restart the feeding pump when the [facility] is returned to the recommended height.  Do not pull or put tension on the tube.  To prevent fluid backflow, kink the G-tube before removing the cap or disconnecting a syringe.  Check the G-tube length every day. Measure from the insertion site to the end of the G-tube. If the length is longer than previous measurements, the tube may be coming out. Call your health care provider if you notice increasing G-tube length.  Oral care, such as brushing teeth, must be continued.  You may need to remove excess air (vent) from the G-tube. Your health care provider will tell you if this is needed.  Always call your health care provider if you have questions or problems with the G-tube. Get help right away if:  You have severe abdominal pain, tenderness, or abdominal bloating (distension).  You  have nausea or vomiting.  You are constipated or have problems moving your bowels.  The G-tube insertion site is red, swollen, has a foul smell, or has yellow or brown drainage.  You have difficulty breathing or shortness of breath.  You have a fever.  You have a large amount of feeding tube residuals.  The G-tube is clogged and cannot be flushed. This information is not intended to replace advice given to you by your health care provider. Make sure you discuss any questions you have with your health care provider. Document Released: 02/16/2002 Document Revised: 05/15/2016 Document Reviewed: 08/15/2013 Elsevier Interactive Patient Education  2017 [employer]  PEG Tube Home Guide A PEG tube is used to put food and fluids into the stomach. Before you leave the hospital, make sure that you know:  How to care for your PEG tube.  How to care for the opening (stoma) in your belly.  How to give yourself feedings and medicines.  When to call your doctor for help.  How do I care for my peg tube? Check your PEG tube every day. Make sure:  It is not too tight.  It is in the right place. There is a mark on the tube that shows when the tube is in the right place. Adjust the tube if you need to.  How do I care for my stoma? Clean your stoma every day. Follow these steps: 1. Wash your hands with soap and water. 2. Wash the stoma gently with warm, soapy water. 3. Rinse the stoma with warm water. 4. Pat the stoma area dry.  Check your stoma for:  Redness.  Leaking.  Skin irritation.  How do I give a feeding? Your doctor will tell you:  How much nutrition and fluid you will need for each feeding.  How often to have a feeding.  Whether you should take medicine in the tube by itself or with a feeding.  To give yourself a feeding, follow these steps. 1. Lay out all of the things that you will need. 2. Make sure that the nutritional formula is at room temperature. 3. Wash  your hands with soap and water. 4. Sit up or stand up straight. You will need to stay straight while you give yourself a feeding. 5. Make sure the syringe plunger is pushed in. Place the tip of the syringe in clear water, and slowly pull the plunger to bring (draw up) the water into the syringe. 6. Remove the clamp and the cap from the PEG tube. 7. Push the water out of the syringe to clean (flush) the tube. 8. If the tube is clear, draw up the formula into the syringe. Make sure to use the right amount for each feeding. Add water if you need to. 9. Slowly push the formula from the syringe through the tube. 10. After the feeding, flush the tube with water. 11. Put the clamp and the cap on the tube. 12. Stay sitting up or standing up straight for at least 30 minutes.  How do I give medicine? To give yourself medicine, follow these steps: 1. Lay out all of the things that you will need. 2. If your medicine is in tablet form, crush it and dissolve it in water. 3. Wash your hands with soap and water. 4. Sit up or stand up straight. You will need to stay straight while you give yourself medicine. 5. Make sure the syringe plunger is pushed in. Place the tip of the syringe in clear water, and slowly pull the plunger to bring (draw up) the water into the syringe. 6. Remove the clamp and the cap from the PEG tube. 7. Push the water out of the syringe to clean (flush) the tube. 8. If the tube is clear, draw up the medicine into the syringe. 9. Slowly push the medicine from the syringe through the tube. 10. Flush the tube with water. 11. Put the clamp and the cap on the tube. 12. Stay sitting up or standing up straight for at least 30 minutes.  You should not take sustained release (SR) medicines through your tube. If you are not sure if your medicine is a SR medicine, ask your doctor or pharmacist. Get help if:  The area around your stoma is sore, irritated, or red.  You have belly pain or  bloating while you are feeding or afterward.  You have a feeling of being sick to your stomach (nausea) that does not go away.  You cannot poop (constipated) or you have watery poop (diarrhea) for a long time.  You have a fever.  You have problems with your PEG tube. Get help right away if:  Your tube is blocked.  Your tube falls out.  You have pain around your tube.  You are bleeding from your tube.  Your tube is leaking.  You choke or you have trouble breathing while you are feeding or afterward. This information is not intended to replace advice given to you by your health care provider. Make sure you discuss any questions you have with your health care provider. Document Released: 01/10/2011 Document Revised: 05/15/2016 Document Reviewed: 12/13/2014 Elsevier Interactive Patient Education  2018  [place].  Esophagogastroduodenoscopy Esophagogastroduodenoscopy (EGD) is a procedure to examine the lining of the esophagus, stomach, and first part of the small intestine (duodenum). This procedure is done to check for problems such as inflammation, bleeding, ulcers, or growths. During this procedure, a long, flexible, lighted tube with a camera attached (endoscope) is inserted down the throat. Tell a health care provider about:  Any allergies you have.  All medicines you are taking, including vitamins, herbs, eye drops, creams, and over-the-counter medicines.  Any problems you or family members have had with anesthetic medicines.  Any blood disorders you have.  Any surgeries you have had.  Any medical conditions you have.  Whether you are pregnant or may be pregnant. What are the risks? Generally, this is a safe procedure. However, problems may occur, including:  Infection.  Bleeding.  A tear (perforation) in the esophagus, stomach, or duodenum.  Trouble breathing.  Excessive sweating.  Spasms of the larynx.  A slowed heartbeat.  Low blood  pressure.  What happens before the procedure?  Follow instructions from your health care provider about eating or drinking restrictions.  Ask your health care provider about: ? Changing or stopping your regular medicines. This is especially important if you are taking diabetes medicines or blood thinners. ? Taking medicines such as aspirin and ibuprofen. These medicines can thin your blood. Do not take these medicines before your procedure if your health care provider instructs you not to.  Plan to have someone take you home after the procedure.  If you wear dentures, be ready to remove them before the procedure. What happens during the procedure?  To reduce your risk of infection, your health care team will wash or sanitize their hands.  An IV tube will be put in a vein in your hand or arm. You will get medicines and fluids through this tube.  You will be given one or more of the following: ? A medicine to help you relax (sedative). ? A medicine to numb the area (local anesthetic). This medicine may be sprayed into your throat. It will make you feel more comfortable and keep you from gagging or coughing during the procedure. ? A medicine for pain.  A mouth guard may be placed in your mouth to protect your teeth and to keep you from biting on the endoscope.  You will be asked to lie on your left side.  The endoscope will be lowered down your throat into your esophagus, stomach, and duodenum.  Air will be put into the endoscope. This will help your health care provider see better.  The lining of your esophagus, stomach, and duodenum will be examined.  Your health care provider may: ? Take a tissue sample so it can be looked at in a lab (biopsy). ? Remove growths. ? Remove objects (foreign bodies) that are stuck. ? Treat any bleeding with medicines or other devices that stop tissue from bleeding. ? Widen (dilate) or stretch narrowed areas of your esophagus and stomach.  The  endoscope will be taken out. The procedure may vary among health care providers and hospitals. What happens after the procedure?  Your blood pressure, heart rate, breathing rate, and blood oxygen level will be monitored often until the medicines you were given have worn off.  Do not eat or drink anything until the numbing medicine has worn off and your gag reflex has returned. This information is not intended to replace advice given to you by your health care provider. Make sure you  discuss any questions you have with your health care provider. Document Released: 04/10/2005 Document Revised: 05/15/2016 Document Reviewed: 11/01/2015 Elsevier Interactive Patient Education  2018 [employer]. Esophagogastroduodenoscopy, Care After Refer to this sheet in the next few weeks. These instructions provide you with information about caring for yourself after your procedure. Your health care provider may also give you more specific instructions. Your treatment has been planned according to current medical practices, but problems sometimes occur. Call your health care provider if you have any problems or questions after your procedure. What can I expect after the procedure? After the procedure, it is common to have:  A sore throat.  Nausea.  Bloating.  Dizziness.  Fatigue.  Follow these instructions at home:  Do not eat or drink anything until the numbing medicine (local anesthetic) has worn off and your gag reflex has returned. You will know that the local anesthetic has worn off when you can swallow comfortably.  Do not drive for 24 hours if you received a medicine to help you relax (sedative).  If your health care provider took a tissue sample for testing during the procedure, make sure to get your test results. This is your responsibility. Ask your health care provider or the department performing the test when your results will be ready.  Keep all follow-up visits as told by your health  care provider. This is important. Contact a health care provider if:  You cannot stop coughing.  You are not urinating.  You are urinating less than usual. Get help right away if:  You have trouble swallowing.  You cannot eat or drink.  You have throat or chest pain that gets worse.  You are dizzy or light-headed.  You faint.  You have nausea or vomiting.  You have chills.  You have a fever.  You have severe abdominal pain.  You have black, tarry, or bloody stools. This information is not intended to replace advice given to you by your health care provider. Make sure you discuss any questions you have with your health care provider. Document Released: 11/24/2012 Document Revised: 05/15/2016 Document Reviewed: 11/01/2015 Elsevier Interactive Patient Education  2018 [place] Anesthesia is a term that refers to techniques, procedures, and medicines that help a person stay safe and comfortable during a medical procedure. Monitored anesthesia care, or sedation, is one type of anesthesia. Your anesthesia specialist may recommend sedation if you will be having a procedure that does not require you to be unconscious, such as:  Cataract surgery.  A dental procedure.  A biopsy.  A colonoscopy.  During the procedure, you may receive a medicine to help you relax (sedative). There are three levels of sedation:  Mild sedation. At this level, you may feel awake and relaxed. You will be able to follow directions.  Moderate sedation. At this level, you will be sleepy. You may not remember the procedure.  Deep sedation. At this level, you will be asleep. You will not remember the procedure.  The more medicine you are given, the deeper your level of sedation will be. Depending on how you respond to the procedure, the anesthesia specialist may change your level of sedation or the type of anesthesia to fit your needs. An anesthesia specialist will  monitor you closely during the procedure. Let your health care provider know about:  Any allergies you have.  All medicines you are taking, including vitamins, herbs, eye drops, creams, and over-the-counter medicines.  Any use of steroids (by  mouth or as a cream).  Any problems you or family members have had with sedatives and anesthetic medicines.  Any blood disorders you have.  Any surgeries you have had.  Any medical conditions you have, such as sleep apnea.  Whether you are pregnant or may be pregnant.  Any use of cigarettes, alcohol, or street drugs. What are the risks? Generally, this is a safe procedure. However, problems may occur, including:  Getting too much medicine (oversedation).  Nausea.  Allergic reaction to medicines.  Trouble breathing. If this happens, a breathing tube may be used to help with breathing. It will be removed when you are awake and breathing on your own.  Heart trouble.  Lung trouble.  Before the procedure Staying hydrated Follow instructions from your health care provider about hydration, which may include:  Up to 2 hours before the procedure - you may continue to drink clear liquids, such as water, clear fruit juice, black coffee, and plain tea.  Eating and drinking restrictions Follow instructions from your health care provider about eating and drinking, which may include:  8 hours before the procedure - stop eating heavy meals or foods such as meat, fried foods, or fatty foods.  6 hours before the procedure - stop eating light meals or foods, such as toast or cereal.  6 hours before the procedure - stop drinking milk or drinks that contain milk.  2 hours before the procedure - stop drinking clear liquids.  Medicines Ask your health care provider about:  Changing or stopping your regular medicines. This is especially important if you are taking diabetes medicines or blood thinners.  Taking medicines such as aspirin and  ibuprofen. These medicines can thin your blood. Do not take these medicines before your procedure if your health care provider instructs you not to.  Tests and exams  You will have a physical exam.  You may have blood tests done to show: ? How well your kidneys and liver are working. ? How well your blood can clot.  General instructions  Plan to have someone take you home from the hospital or clinic.  If you will be going home right after the procedure, plan to have someone with you for 24 hours.  What happens during the procedure?  Your blood pressure, heart rate, breathing, level of pain and overall condition will be monitored.  An IV tube will be inserted into one of your veins.  Your anesthesia specialist will give you medicines as needed to keep you comfortable during the procedure. This may mean changing the level of sedation.  The procedure will be performed. After the procedure  Your blood pressure, heart rate, breathing rate, and blood oxygen level will be monitored until the medicines you were given have worn off.  Do not drive for 24 hours if you received a sedative.  You may: ? Feel sleepy, clumsy, or nauseous. ? Feel forgetful about what happened after the procedure. ? Have a sore throat if you had a breathing tube during the procedure. ? Vomit. This information is not intended to replace advice given to you by your health care provider. Make sure you discuss any questions you have with your health care provider. Document Released: 09/03/2005 Document Revised: 05/16/2016 Document Reviewed: 03/30/2016 Elsevier Interactive Patient Education  2018 [place], Care After These instructions provide you with information about caring for yourself after your procedure. Your health care provider may also give you more specific instructions. Your treatment has been  planned according to current medical practices, but problems sometimes occur.  Call your health care provider if you have any problems or questions after your procedure. What can I expect after the procedure? After your procedure, it is common to:  Feel sleepy for several hours.  Feel clumsy and have poor balance for several hours.  Feel forgetful about what happened after the procedure.  Have poor judgment for several hours.  Feel nauseous or vomit.  Have a sore throat if you had a breathing tube during the procedure.  Follow these instructions at home: For at least 24 hours after the procedure:   Do not: ? Participate in activities in which you could fall or become injured. ? Drive. ? Use heavy machinery. ? Drink alcohol. ? Take sleeping pills or medicines that cause drowsiness. ? Make important decisions or sign legal documents. ? Take care of children on your own.  Rest. Eating and drinking  Follow the diet that is recommended by your health care provider.  If you vomit, drink water, juice, or soup when you can drink without vomiting.  Make sure you have little or no nausea before eating solid foods. General instructions  Have a responsible adult stay with you until you are awake and alert.  Take over-the-counter and prescription medicines only as told by your health care provider.  If you smoke, do not smoke without supervision.  Keep all follow-up visits as told by your health care provider. This is important. Contact a health care provider if:  You keep feeling nauseous or you keep vomiting.  You feel light-headed.  You develop a rash.  You have a fever. Get help right away if:  You have trouble breathing. This information is not intended to replace advice given to you by your health care provider. Make sure you discuss any questions you have with your health care provider. Document Released: 03/30/2016 Document Revised: 07/30/2016 Document Reviewed: 03/30/2016 Elsevier Interactive Patient Education  [employer].

## 2018-08-03 NOTE — H&P (Signed)
[name]; [id number]; 1951/06/07   HPI Patient is a 67 year old white male with a history of tonsillar cancer who now is referred to my care by Dr. Ellin Saba for a PEG.  I just put a Port-A-Cath in him in July of this year.  He has developed mucositis and is having difficulty keeping his nutrition up.  It was stated that his tonsillar lesion has decreased in size.  He has pain with eating food.  He currently has 0 out of 10 pain. Past Medical History:  Diagnosis Date  . HOH (hard of hearing)   . Hyperlipidemia   . Mass of oral cavity 05/12/2018  . Positive FIT (fecal immunochemical test) 05/12/2018  . PTSD (post-traumatic stress disorder)    has not been offically diagnosised  . Tonsillar cancer (HCC) 06/02/2018    Past Surgical History:  Procedure Laterality Date  . APPENDECTOMY  1969  . COLONOSCOPY W/ POLYPECTOMY    . FINGER FRACTURE SURGERY Left   . MULTIPLE EXTRACTIONS WITH ALVEOLOPLASTY N/A 06/15/2018   Procedure: Extraction of tooth #'s 2,12,13,18,20,and 21 with alveoloplasty and gross debridement of remaining teeth;  Surgeon: Charlynne Pander, DDS;  Location: [facility] OR;  Service: Oral Surgery;  Laterality: N/A;  . PORTACATH PLACEMENT Left 07/02/2018   Procedure: INSERTION PORT-A-CATH;  Surgeon: Franky Macho, MD;  Location: [facility] ORS;  Service: General;  Laterality: Left;    Family History  Problem Relation Age of Onset  . Diabetes Father     Current Outpatient Medications on File Prior to Visit  Medication Sig Dispense Refill  . ibuprofen (ADVIL,MOTRIN) 200 MG tablet Take 400 mg by mouth every 6 (six) hours as needed for moderate pain.     Marland Kitchen lidocaine-prilocaine (EMLA) cream Apply to affected area once 30 g 3  . Morphine Sulfate (MORPHINE CONCENTRATE) 10 mg / 0.5 ml concentrated solution Take 0.5 mLs (10 mg total) by mouth every 3 (three) hours as needed for severe pain. 60 mL 0  . oxyCODONE-acetaminophen (PERCOCET) 5-325 MG tablet Take one tablet by mouth every 4 hours as  needed for pain. 30 tablet 0  . prochlorperazine (COMPAZINE) 10 MG tablet Take 1 tablet (10 mg total) by mouth every 6 (six) hours as needed (Nausea or vomiting). 30 tablet 1  . sodium fluoride (FLUORISHIELD) 1.1 % GEL dental gel Instill one drop of gel per tooth space of fluoride tray. Place over teeth for 5 minutes. Remove. Spit out excess. Repeat nightly. 120 mL prn  . sucralfate (CARAFATE) 1 GM/10ML suspension Carafate 1gm/6ml & Viscous lidocaine 2% 1:1 mixture.  Swish and swallow 1 tablespoon four times a day. 360 mL 2  . fluconazole (DIFLUCAN) 100 MG tablet Take by mouth.     No current facility-administered medications on file prior to visit.     No Known Allergies  Social History   Substance and Sexual Activity  Alcohol Use Yes  . Alcohol/week: 12.0 standard drinks  . Types: 12 Cans of beer per week    Social History   Tobacco Use  Smoking Status Former Smoker  . Years: 15.00  . Types: Cigarettes  . Last attempt to quit: 2000  . Years since quitting: 19.6  Smokeless Tobacco Never Used    Review of Systems  Constitutional: Positive for weight loss.  HENT: Positive for sinus pain and sore throat.   Eyes: Negative.   Respiratory: Negative.   Cardiovascular: Negative.   Gastrointestinal: Negative.   Genitourinary: Negative.   Musculoskeletal: Negative.   Skin: Negative.  Neurological: Negative.   Endo/Heme/Allergies: Negative.   Psychiatric/Behavioral: Negative.     Objective   Vitals:   08/03/18 0927  BP: 133/74  Pulse: 77  Resp: 20  Temp: 97.8 F (36.6 C)    Physical Exam  Constitutional: He is oriented to person, place, and time. He appears well-developed and well-nourished. No distress.  HENT:  Head: Normocephalic and atraumatic.  No obvious mass lesion noted  Neck: Normal range of motion. Neck supple.  Cardiovascular: Normal rate, regular rhythm and normal heart sounds. Exam reveals no gallop and no friction rub.  No murmur  heard. Pulmonary/Chest: Effort normal and breath sounds normal. No stridor. No respiratory distress. He has no wheezes. He has no rales.  Abdominal: Soft. Bowel sounds are normal. He exhibits no distension and no mass. There is no tenderness. There is no guarding.  Lymphadenopathy:    He has cervical adenopathy.  Neurological: He is alert and oriented to person, place, and time.  Skin: Skin is warm and dry.  Vitals reviewed.  Oncology notes reviewed Assessment  Dysphasia, tonsillar carcinoma Plan   Patient is scheduled for a PEG tomorrow.  The risks and benefits of the procedure including bleeding, infection, and the possibility of an open procedure were fully explained to the patient, who gave informed consent.

## 2018-08-03 NOTE — Progress Notes (Signed)
Forestine Na CSW Progress Note  Call from [place], [place], [place] Education officer, museum.  States patient is not eligible for [facility] transport.  She will contact him to discuss option of [employer] [place] volunteer transport if needed [phone]).  Referral also received from C Page RN to meet w patient today at 11:15.  CSW will do so and further assess.  Edwyna Shell, LCSW Clinical Social Worker Phone:  [phone]

## 2018-08-03 NOTE — Patient Instructions (Signed)
[place] at [facility]  Discharge Instructions:  Today you received a fluid bolus for hydration. Follow up as scheduled. Call clinic for any questions or concerns. _______________________________________________________________  Thank you for choosing [place] at [facility] to provide your oncology and hematology care.  To afford each patient quality time with our providers, please arrive at least 15 minutes before your scheduled appointment.  You need to re-schedule your appointment if you arrive 10 or more minutes late.  We strive to give you quality time with our providers, and arriving late affects you and other patients whose appointments are after yours.  Also, if you no show three or more times for appointments you may be dismissed from the clinic.  Again, thank you for choosing [place] [facility] [place] hope is that these requests will allow you access to exceptional care and in a timely manner. _______________________________________________________________  If you have questions after your visit, please contact our office at [phone] between the hours of 8:30 a.m. and 5:00 p.m. Voicemails left after 4:30 p.m. will not be returned until the following business day. _______________________________________________________________  For prescription refill requests, have your pharmacy contact our office. _______________________________________________________________  Recommendations made by the consultant and any test results will be sent to your referring physician. _______________________________________________________________

## 2018-08-03 NOTE — Progress Notes (Signed)
Remote Nutrition Note  Patient had appointment scheduled with RD today, however, was pushed back due to cancellation of pts treatment due to low WBC.   He now has PEG placement scheduled for tomorrow.   Pt had been followed by alternate [facility] RD. -last seen 7/26. At that time, patient was 175 lbs 12 oz. He is now 157 lbs 10 oz. A loss of nearly 20 lbs in <3 weeks.   Patient PEG placement is scheduled as outpatient procedure tomorrow. Has not received any information about how to manage tube as of yet.   RD called patient and asked if he would be willing to come in on Friday to receive hands on Practice with using PEG.  He says he can only come in if "someone puts some gas in my truck". He says he the transportation requirements are unmanageable. He says he did 100 miles today. "This whole thing is f'd up". RD stated he would speak with nurse navigator and get back to him.   Spoke to [employer]. She stated patient would be able to receive some gas assistance when he came in on Friday.   RD called patient back. He was agreeable to coming in now. He said he preferred afternoon/late morning. RD schedule him for 11:30. He says he will come in so long as "nothing changes between now and then and I feel well enough". Did say he would call if he didn't think he could make it.   Of note, he said all he are today was 4 containers of mac/cheese. He has significant oral pain.   Then called and spoke with [facility] representative to get patient set up with supplies outpatient. Notes patient will likely receive services/feeding by Friday.    Current Anthropometrics: Height: _0  (172.72 cm) Weight: 157 lbs 10 oz (71.65 kg) BMI:24  Estimated needs:  Energy: 2300-2500 kcals (32-35 kcal/kg bw) Protein: 100-115g Pro (1.4-1.6 g/kg bw) Fluid: >2.3 L fluid (1 ml/kcal)  Initial appropriate TF regimen would be as follows:  Osmolite 1.5: 1.5 cans QID. w 60 cc flush before and after (provides extra 480 cc  free water). +Additional 240cc free water bolus TID.   Provides: 2133 kcals, 89.4g Pro, 1086 ml fluid w/ extra 1200 cc free water from flushes and water bolus.  Could meet remaining needs with oral intake of 1 ensure each day.   [name] RD, LDN, CNSC Clinical Nutrition Available Tues-Sat via Pager: [phone] 08/03/2018 2:35 PM

## 2018-08-04 ENCOUNTER — Encounter (HOSPITAL_COMMUNITY)
Admission: RE | Disposition: A | Discharge status: Home / Self Care | Payer: Self-pay | Source: Ambulatory Visit | Attending: General Surgery

## 2018-08-04 ENCOUNTER — Encounter (HOSPITAL_COMMUNITY): Payer: Self-pay | Admitting: Speech Pathology

## 2018-08-04 ENCOUNTER — Ambulatory Visit (HOSPITAL_COMMUNITY): Payer: Self-pay

## 2018-08-04 ENCOUNTER — Ambulatory Visit (HOSPITAL_COMMUNITY)
Admission: RE | Admit: 2018-08-04 | Discharge: 2018-08-04 | Disposition: A | Discharge status: Home / Self Care | Payer: Medicare Other | Source: Ambulatory Visit | Attending: General Surgery | Admitting: General Surgery

## 2018-08-04 ENCOUNTER — Ambulatory Visit (HOSPITAL_COMMUNITY): Payer: Medicare Other | Admitting: Certified Registered"

## 2018-08-04 ENCOUNTER — Encounter (HOSPITAL_COMMUNITY): Payer: Self-pay | Admitting: Anesthesiology

## 2018-08-04 DIAGNOSIS — F419 Anxiety disorder, unspecified: Secondary | ICD-10-CM | POA: Insufficient documentation

## 2018-08-04 DIAGNOSIS — Z833 Family history of diabetes mellitus: Secondary | ICD-10-CM | POA: Diagnosis not present

## 2018-08-04 DIAGNOSIS — C099 Malignant neoplasm of tonsil, unspecified: Secondary | ICD-10-CM

## 2018-08-04 DIAGNOSIS — J387 Other diseases of larynx: Secondary | ICD-10-CM

## 2018-08-04 DIAGNOSIS — E785 Hyperlipidemia, unspecified: Secondary | ICD-10-CM | POA: Insufficient documentation

## 2018-08-04 DIAGNOSIS — Z86010 Personal history of colonic polyps: Secondary | ICD-10-CM | POA: Diagnosis not present

## 2018-08-04 DIAGNOSIS — R195 Other fecal abnormalities: Secondary | ICD-10-CM | POA: Diagnosis not present

## 2018-08-04 DIAGNOSIS — H9190 Unspecified hearing loss, unspecified ear: Secondary | ICD-10-CM | POA: Insufficient documentation

## 2018-08-04 DIAGNOSIS — Z87891 Personal history of nicotine dependence: Secondary | ICD-10-CM | POA: Diagnosis not present

## 2018-08-04 DIAGNOSIS — Z79899 Other long term (current) drug therapy: Secondary | ICD-10-CM | POA: Diagnosis not present

## 2018-08-04 DIAGNOSIS — K1230 Oral mucositis (ulcerative), unspecified: Secondary | ICD-10-CM | POA: Insufficient documentation

## 2018-08-04 DIAGNOSIS — F4310 Post-traumatic stress disorder, unspecified: Secondary | ICD-10-CM | POA: Insufficient documentation

## 2018-08-04 DIAGNOSIS — R1310 Dysphagia, unspecified: Secondary | ICD-10-CM | POA: Diagnosis not present

## 2018-08-04 HISTORY — PX: ESOPHAGOGASTRODUODENOSCOPY (EGD) WITH PROPOFOL: SHX5813

## 2018-08-04 HISTORY — PX: ESOPHAGOGASTRODUODENOSCOPY: SHX1529

## 2018-08-04 HISTORY — PX: PEG PLACEMENT: SHX5437

## 2018-08-04 SURGERY — ESOPHAGOGASTRODUODENOSCOPY (EGD) WITH PROPOFOL
Anesthesia: Monitor Anesthesia Care

## 2018-08-04 MED ORDER — HYDROMORPHONE HCL 1 MG/ML IJ SOLN
0.2500 mg | INTRAMUSCULAR | Status: DC | PRN
  Administered 2018-08-04: 0.5 mg via INTRAVENOUS

## 2018-08-04 MED ORDER — HYDROMORPHONE HCL 1 MG/ML IJ SOLN
INTRAMUSCULAR | Status: AC
  Filled 2018-08-04: qty 0.5

## 2018-08-04 MED ORDER — MIDAZOLAM HCL 5 MG/5ML IJ SOLN
INTRAMUSCULAR | Status: DC | PRN
  Administered 2018-08-04: 2 mg via INTRAVENOUS

## 2018-08-04 MED ORDER — LIDOCAINE HCL (PF) 1 % IJ SOLN
INTRAMUSCULAR | Status: AC
  Filled 2018-08-04: qty 5

## 2018-08-04 MED ORDER — PROPOFOL 10 MG/ML IV BOLUS
INTRAVENOUS | Status: AC
  Filled 2018-08-04: qty 20

## 2018-08-04 MED ORDER — CHLORHEXIDINE GLUCONATE CLOTH 2 % EX PADS: 6.0000 | MEDICATED_PAD | Freq: Once | CUTANEOUS | Status: DC

## 2018-08-04 MED ORDER — LACTATED RINGERS IV SOLN
INTRAVENOUS | Status: DC
  Administered 2018-08-04: 10:00:00 via INTRAVENOUS

## 2018-08-04 MED ORDER — PROPOFOL 10 MG/ML IV BOLUS
INTRAVENOUS | Status: DC | PRN
  Administered 2018-08-04: 50 mg via INTRAVENOUS
  Administered 2018-08-04: 20 mg via INTRAVENOUS
  Administered 2018-08-04: 30 mg via INTRAVENOUS

## 2018-08-04 MED ORDER — LIDOCAINE HCL 1 % IJ SOLN
INTRAMUSCULAR | Status: DC | PRN
  Administered 2018-08-04: 2 mg

## 2018-08-04 MED ORDER — LIDOCAINE HCL (PF) 1 % IJ SOLN
INTRAMUSCULAR | Status: DC | PRN
  Administered 2018-08-04: 20 mg

## 2018-08-04 MED ORDER — CEFAZOLIN SODIUM-DEXTROSE 2-4 GM/100ML-% IV SOLN
2.0000 g | INTRAVENOUS | Status: AC
  Administered 2018-08-04: 2 g via INTRAVENOUS
  Filled 2018-08-04: qty 100

## 2018-08-04 MED ORDER — MIDAZOLAM HCL 2 MG/2ML IJ SOLN
INTRAMUSCULAR | Status: AC
  Filled 2018-08-04: qty 2

## 2018-08-04 NOTE — Anesthesia Postprocedure Evaluation (Signed)
Anesthesia Post Note  Patient: [name]  Procedure(s) Performed: ESOPHAGOGASTRODUODENOSCOPY (EGD) WITH PROPOFOL (N/A ) PERCUTANEOUS ENDOSCOPIC GASTROSTOMY (PEG) PLACEMENT (N/A )  Patient location during evaluation: PACU Anesthesia Type: MAC Level of consciousness: awake and alert, oriented and patient cooperative Pain management: satisfactory to patient Vital Signs Assessment: post-procedure vital signs reviewed and stable Respiratory status: spontaneous breathing, nonlabored ventilation and respiratory function stable Cardiovascular status: blood pressure returned to baseline and stable Postop Assessment: no headache, no backache and no apparent nausea or vomiting Anesthetic complications: no     Last Vitals:  Vitals:   08/04/18 0912  BP: 103/62  Pulse: 79  Resp: 18  Temp: 36.7 C  SpO2: 100%    Last Pain:  Vitals:   08/04/18 0912  TempSrc: Oral  PainSc: 0-No pain                 Sherylann Vangorden Terri Piedra

## 2018-08-04 NOTE — Progress Notes (Signed)
Telephone call to Champ Mungo at [phone] significant other. "I just left work and on the way from [place] to pick him up".  Patient notified.

## 2018-08-04 NOTE — Transfer of Care (Signed)
Immediate Anesthesia Transfer of Care Note  Patient: [name]  Procedure(s) Performed: ESOPHAGOGASTRODUODENOSCOPY (EGD) WITH PROPOFOL (N/A ) PERCUTANEOUS ENDOSCOPIC GASTROSTOMY (PEG) PLACEMENT (N/A )  Patient Location: PACU  Anesthesia Type:MAC  Level of Consciousness: awake, alert , oriented and patient cooperative  Airway & Oxygen Therapy: Patient Spontanous Breathing  Post-op Assessment: Report given to RN and Post -op Vital signs reviewed and stable  Post vital signs: Reviewed and stable  Last Vitals:  Vitals Value Taken Time  BP    Temp    Pulse 68 08/04/2018 10:52 AM  Resp 27 08/04/2018 10:52 AM  SpO2 94 % 08/04/2018 10:52 AM  Vitals shown include unvalidated device data.  Last Pain:  Vitals:   08/04/18 0912  TempSrc: Oral  PainSc: 0-No pain      Patients Stated Pain Goal: 7 (47/34/03 7096)  Complications: No apparent anesthesia complications

## 2018-08-04 NOTE — Op Note (Signed)
[facility] Patient Name: [name] Procedure Date: 08/04/2018 10:07 AM MRN: [id number] Date of Birth: 09/20/1951 Attending MD: Franky Macho , MD CSN: [id number] Age: 67 Admit Type: Inpatient Procedure:                Upper GI endoscopy, PEG Indications:              Dysphagia Providers:                Franky Macho, MD, Jannett Celestine, RN, Burke Keels,                            Technician Referring MD:              Medicines:                Propofol per Anesthesia Complications:            No immediate complications. Estimated blood loss:                            Minimal. Estimated Blood Loss:     Estimated blood loss was minimal. Procedure:                Pre-Anesthesia Assessment:                           - Prior to the procedure, a History and Physical                            was performed, and patient medications and                            allergies were reviewed. The patient is competent.                            The risks and benefits of the procedure and the                            sedation options and risks were discussed with the                            patient. All questions were answered and informed                            consent was obtained. Patient identification and                            proposed procedure were verified by the physician,                            the nurse, the anesthesiologist and the technician                            in the endoscopy suite. Mental Status Examination:                            alert and oriented.  Airway Examination: normal                            oropharyngeal airway and neck mobility. Respiratory                            Examination: clear to auscultation. CV Examination:                            RRR, no murmurs, no S3 or S4. Prophylactic                            Antibiotics: The patient requires prophylactic                            antibiotics for planned PEG placement. The  patient                            received antibiotic therapy today, before the                            procedure started. Prior Anticoagulants: The                            patient has taken no previous anticoagulant or                            antiplatelet agents. ASA Grade Assessment: III - A                            patient with severe systemic disease. After                            reviewing the risks and benefits, the patient was                            deemed in satisfactory condition to undergo the                            procedure. The anesthesia plan was to use deep                            sedation / analgesia. Immediately prior to                            administration of medications, the patient was                            re-assessed for adequacy to receive sedatives. The                            heart rate, respiratory rate, oxygen saturations,  blood pressure, adequacy of pulmonary ventilation,                            and response to care were monitored throughout the                            procedure. The physical status of the patient was                            re-assessed after the procedure.                           After obtaining informed consent, the endoscope was                            passed under direct vision. Throughout the                            procedure, the patient's blood pressure, pulse, and                            oxygen saturations were monitored continuously. The                            [id number]-[id number] ([id number]) was introduced through the                            mouth, and advanced to the second part of duodenum.                            The upper GI endoscopy was accomplished without                            difficulty. The patient tolerated the procedure                            well.                           The PEG was then placed using the pull technique.                             An area in the epigastrium was palpated and                            transilluminated. 1% xylocaine was used for local                            anesthesia. A needle catheter was then advanced                            into the stomach under direct visualization without  difficulty. The guide wire was removed with the                            endoscope. A 23fr gastrostomy tube was then                            attached to the guide wire and the wire and tube                            were pulled out via the abdominal wall to the 2.5cm                            Debbie Yearick. The endoscope was then placed back into the                            stomach and confirmation of PEG placement in the                            antrum was done. Scope In: 10:31:58 AM Scope Out: 10:41:54 AM Total Procedure Duration: 0 hours 9 minutes 56 seconds  Findings:      Erythema was found diffusely, throughout the larynx. Estimated blood       loss: none.      The second portion of the duodenum was normal.      The entire examined stomach was normal.      Larynx within normal limits. No gross mass lesions noted. Mucosal       erythema and edema diffusely present. Impression:               - Erythema was visualized diffusely, throughout the                            larynx.                           - Normal second portion of the duodenum.                           - Normal stomach.                           - No specimens collected. Moderate Sedation:      Moderate (conscious) sedation was administered by the endoscopy nurse       and supervised by the endoscopist. The patient's oxygen saturation,       heart rate, blood pressure and response to care were monitored. Recommendation:           - Written discharge instructions were provided to                            the patient.                           - The signs and symptoms of potential delayed  complications were discussed with the patient.                           - Patient has a contact number available for                            emergencies.                           - Return to normal activities tomorrow.                           - Resume previous diet.                           - Continue present medications. Procedure Code(s):        --- Professional ---                           [phone], Esophagogastroduodenoscopy, flexible,                            transoral; with directed placement of percutaneous                            gastrostomy tube Diagnosis Code(s):        --- Professional ---                           R13.10, Dysphagia, unspecified                           J38.7, Other diseases of larynx                           C09.9, Malignant neoplasm of tonsil, unspecified CPT copyright 2017 American Medical Association. All rights reserved. The codes documented in this report are preliminary and upon coder review may  be revised to meet current compliance requirements. Franky Macho, MD Franky Macho, MD 08/04/2018 11:09:20 AM This report has been signed electronically. Number of Addenda: 0

## 2018-08-04 NOTE — Anesthesia Preprocedure Evaluation (Signed)
Anesthesia Evaluation  Patient identified by MRN, date of birth, ID band Patient awake    Reviewed: Allergy & Precautions, H&P , NPO status , Patient's Chart, lab work & pertinent test results  Airway Mallampati: III  TM Distance: >3 FB Neck ROM: limited  Mouth opening: Limited Mouth Opening  Dental no notable dental hx. (+) Partial Upper, Partial Lower   Pulmonary neg pulmonary ROS, former smoker,    Pulmonary exam normal breath sounds clear to auscultation       Cardiovascular Exercise Tolerance: Good negative cardio ROS   Rhythm:regular Rate:Normal     Neuro/Psych PSYCHIATRIC DISORDERS Anxiety negative neurological ROS  negative psych ROS   GI/Hepatic negative GI ROS, Neg liver ROS,   Endo/Other  negative endocrine ROS  Renal/GU negative Renal ROS  negative genitourinary   Musculoskeletal   Abdominal   Peds  Hematology negative hematology ROS (+)   Anesthesia Other Findings Tonsillar cancer ([place]) Receiving Rad TX; Awaiting chemo.  Restricted mouth opening from radiation. Probable impossible direct laryngoscopic intubation.  Pt consented for open procedure if PEG is not possible  Reproductive/Obstetrics negative OB ROS                             Anesthesia Physical Anesthesia Plan  ASA: IV  Anesthesia Plan: MAC   Post-op Pain Management:    Induction:   PONV Risk Score and Plan:   Airway Management Planned:   Additional Equipment:   Intra-op Plan:   Post-operative Plan:   Informed Consent: I have reviewed the patients History and Physical, chart, labs and discussed the procedure including the risks, benefits and alternatives for the proposed anesthesia with the patient or authorized representative who has indicated his/her understanding and acceptance.   Dental Advisory Given  Plan Discussed with: CRNA  Anesthesia Plan Comments:         Anesthesia Quick  Evaluation

## 2018-08-04 NOTE — Discharge Instructions (Signed)
PEG Tube Home Guide A percutaneous endoscopic gastrostomy (PEG) tube is used to deliver food and fluids directly into the stomach. The tube has a clamp, a cap, and two anchors (bolsters). One bolster keeps the tube from coming out of the stomach. The other bolster holds the tube against the abdomen. You will be taught how to use and adjust your PEG tube before you leave the hospital. You will also be taught how to care for the opening (stoma) in your abdomen. Make sure that you understand:  How to care for your PEG tube.  How to care for your stoma.  How to give yourself feedings and medicines.  When to call your health care provider for help.  How do I care for my peg tube? Check your PEG tube every day. Make sure:  It is not too tight. The bolster should rest gently over the stoma.  It is in the right position. There is a mark on the tube that shows when it is in the right position. Adjust the tube if you need to.  How do I care for my stoma? Clean your stoma every day. Follow these steps: 1. Wash your hands with soap and water. 2. Check your stoma for redness, leaking, or skin irritation. 3. Wash the stoma gently with warm, soapy water. 4. Rinse the stoma with warm water. 5. Pat the stoma area dry. You may place a gauze pad over the opening between the outer bolster and your stoma.  How do I give myself nutritional formula? Your health care provider will give you instructions about:  How much nutrition and fluid you will need for each feeding.  How often to have a feeding.  Whether to take medicine in the tube by itself or with a feeding.  To give yourself a feeding, follow these steps: 1. Lay out all of the equipment that you will need. 2. Make sure that the nutritional formula is at room temperature. 3. Wash your hands with soap and water. 4. Position yourself so that you are upright. You will need to stay upright throughout the feeding and for at least 30 minutes after  the feeding. 5. Make sure the syringe plunger is pushed in. Place the tip of the syringe in clear water, and slowly pull the plunger to bring (draw up) the water into the syringe. 6. Remove the clamp and the cap from the PEG tube. 7. Push the water out of the syringe to clean (flush) the tube. 8. If the tube is clear, draw up the formula into the syringe. Make sure to use the right amount for each feeding and add water if necessary. 9. Slowly push the formula from the syringe through the tube. 10. After the feeding, flush the tube with water. 11. Put the clamp and the cap on the tube.  How do I give myself medicine? To give yourself medicine, follow these steps: 1. Lay out all of the equipment that you will need. 2. If your medicine is in tablet form, crush the tablet and dissolve it in water. 3. Wash your hands with soap and water. 4. Position yourself so that you are upright. You will need to stay upright while you give yourself medicine and for at least 30 minutes afterward. 5. Make sure the syringe plunger is pushed in. Place the tip of the syringe in clear water, and slowly pull the plunger to bring (draw up) the water into the syringe. 6. Remove the clamp and the cap from  the PEG tube. 7. Push the water out of the syringe to clean (flush) the tube. 8. If the tube is clear, draw up the medicine into the syringe. 9. Slowly push the medicine from the syringe through the tube. 10. Flush the tube with water. 11. Put the clamp and the cap on the tube.  You should not take sustained release (SR) medicines through your tube. If you are unsure if your medicine is a SR medicine, ask your health care provider or pharmacist. Contact a health care provider if:  You have soreness, redness, or irritation around your stoma.  You have abdominal pain or bloating during or after your feedings.  You have nausea, constipation, or diarrhea that will not go away.  You have a fever.  You have  problems with your PEG tube. Get help right away if:  Your tube is blocked.  Your tube falls out.  You have pain around your tube.  You are bleeding from your tube.  Your tube is leaking.  You choke or you have trouble breathing during or after a feeding. This information is not intended to replace advice given to you by your health care provider. Make sure you discuss any questions you have with your health care provider. Document Released: 04/24/2015 Document Revised: 05/12/2016 Document Reviewed: 12/13/2014 Elsevier Interactive Patient Education  2018 [place]. Gastrostomy Tube Home Guide, Adult A gastrostomy tube is a tube that is surgically placed into the stomach. It is also called a G-tube. G-tubes are used when a person is unable to eat and drink enough on their own to stay healthy. The tube is inserted into the stomach through a small cut (incision) in the skin. This tube is used for:  Feeding.  Giving medication.  Gastrostomy tube care  Wash your hands with soap and water.  Remove the old dressing (if any). Some styles of G-tubes may need a dressing inserted between the skin and the G-tube. Other types of G-tubes do not require a dressing. Ask your health care provider if a dressing is needed.  Check the area where the tube enters the skin (insertion site) for redness, swelling, or pus-like (purulent) drainage. A small amount of clear or tan liquid drainage is normal. Check to make sure scar tissue (skin) is not growing around the insertion site. This could have a raised, bumpy appearance.  A cotton swab can be used to clean the skin around the tube: ? When the G-tube is first put in, a normal saline solution or water can be used to clean the skin. ? Mild soap and warm water can be used when the skin around the G-tube site has healed. ? Roll the cotton swab around the G-tube insertion site to remove any drainage or crusting at the insertion site. Stomach  residuals Feeding tube residuals are the amount of liquids that are in the stomach at any given time. Residuals may be checked before giving feedings, medications, or as instructed by your health care provider.  Ask your health care provider if there are instances when you would not start tube feedings depending on the amount or type of contents withdrawn from the stomach.  Check residuals by attaching a syringe to the G-tube and pulling back on the syringe plunger. Note the amount, and return the residual back into the stomach.  Flushing the G-tube  The G-tube should be periodically flushed with clean warm water to keep it from clogging. ? Flush the G-tube after feedings or medications. Draw  up 30 mL of warm water in a syringe. Connect the syringe to the G-tube and slowly push the water into the tube. ? Do not push feedings, medications, or flushes rapidly. Flush the G-tube gently and slowly. ? Only use syringes made for G-tubes to flush medications or feedings. ? Your health care provider may want the G-tube flushed more often or with more water. If this is the case, follow your health care provider's instructions. Feedings Your health care provider will determine whether feedings are given as a bolus (a certain amount given at one time and at scheduled times) or whether feedings will be given continuously on a feeding pump.  Formulas should be given at room temperature.  If feedings are continuous, no more than 4 hours worth of feedings should be placed in the feeding bag. This helps prevent spoilage or accidental excess infusion.  Cover and place unused formula in the refrigerator.  If feedings are continuous, stop the feedings when medications or flushes are given. Be sure to restart the feedings.  Feeding bags and syringes should be replaced as instructed by your health care provider.  Giving medication  In general, it is best if all medications are in a liquid form for G-tube  administration. Liquid medications are less likely to clog the G-tube. ? Mix the liquid medication with 30 mL (or amount recommended by your health care provider) of warm water. ? Draw up the medication into the syringe. ? Attach the syringe to the G-tube and slowly push the mixture into the G-tube. ? After giving the medication, draw up 30 mL of warm water in the syringe and slowly flush the G-tube.  For pills or capsules, check with your health care provider first before crushing medications. Some pills are not effective if they are crushed. Some capsules are sustained-release medications. ? If appropriate, crush the pill or capsule and mix with 30 mL of warm water. Using the syringe, slowly push the medication through the tube, then flush the tube with another 30 mL of tap water. G-tube problems G-tube was pulled out.  Cause: May have been pulled out accidentally.  Solutions: Cover the opening with clean dressing and tape. Call your health care provider right away. The G-tube should be put in as soon as possible (within 4 hours) so the G-tube opening (tract) does not close. The G-tube needs to be put in at a health care setting. An X-ray needs to be done to confirm placement before the G-tube can be used again.  Redness, irritation, soreness, or foul odor around the gastrostomy site.  Cause: May be caused by leakage or infection.  Solutions: Call your health care provider right away.  Large amount of leakage of fluid or mucus-like liquid present (a large amount means it soaks clothing).  Cause: Many reasons could cause the G-tube to leak.  Solutions: Call your health care provider to discuss the amount of leakage.  Skin or scar tissue appears to be growing where tube enters skin.  Cause: Tissue growth may develop around the insertion site if the G-tube is moved or pulled on excessively.  Solutions: Secure tube with tape so that excess movement does not occur. Call your health care  provider.  G-tube is clogged.  Cause: Thick formula or medication.  Solutions: Try to slowly push warm water into the tube with a large syringe. Never try to push any object into the tube to unclog it. Do not force fluid into the G-tube. If you are unable  to unclog the tube, call your health care provider right away.  Tips  Head of bed (HOB) position refers to the upright position of a person's upper body. ? When giving medications or a feeding bolus, keep the [facility] up as told by your health care provider. Do this during the feeding and for 1 hour after the feeding or medication administration. ? If continuous feedings are being given, it is best to keep the [facility] up as told by your health care provider. When ADLs (activities of daily living) are performed and the [facility] needs to be flat, be sure to turn the feeding pump off. Restart the feeding pump when the [facility] is returned to the recommended height.  Do not pull or put tension on the tube.  To prevent fluid backflow, kink the G-tube before removing the cap or disconnecting a syringe.  Check the G-tube length every day. Measure from the insertion site to the end of the G-tube. If the length is longer than previous measurements, the tube may be coming out. Call your health care provider if you notice increasing G-tube length.  Oral care, such as brushing teeth, must be continued.  You may need to remove excess air (vent) from the G-tube. Your health care provider will tell you if this is needed.  Always call your health care provider if you have questions or problems with the G-tube. Get help right away if:  You have severe abdominal pain, tenderness, or abdominal bloating (distension).  You have nausea or vomiting.  You are constipated or have problems moving your bowels.  The G-tube insertion site is red, swollen, has a foul smell, or has yellow or brown drainage.  You have difficulty breathing or shortness of breath.  You have a  fever.  You have a large amount of feeding tube residuals.  The G-tube is clogged and cannot be flushed. This information is not intended to replace advice given to you by your health care provider. Make sure you discuss any questions you have with your health care provider. Document Released: 02/16/2002 Document Revised: 05/15/2016 Document Reviewed: 08/15/2013 Elsevier Interactive Patient Education  2017 [place], Care After These instructions provide you with information about caring for yourself after your procedure. Your health care provider may also give you more specific instructions. Your treatment has been planned according to current medical practices, but problems sometimes occur. Call your health care provider if you have any problems or questions after your procedure. What can I expect after the procedure? After your procedure, it is common to:  Feel sleepy for several hours.  Feel clumsy and have poor balance for several hours.  Feel forgetful about what happened after the procedure.  Have poor judgment for several hours.  Feel nauseous or vomit.  Have a sore throat if you had a breathing tube during the procedure.  Follow these instructions at home: For at least 24 hours after the procedure:   Do not: ? Participate in activities in which you could fall or become injured. ? Drive. ? Use heavy machinery. ? Drink alcohol. ? Take sleeping pills or medicines that cause drowsiness. ? Make important decisions or sign legal documents. ? Take care of children on your own.  Rest. Eating and drinking  Follow the diet that is recommended by your health care provider.  If you vomit, drink water, juice, or soup when you can drink without vomiting.  Make sure you have little or  no nausea before eating solid foods. General instructions  Have a responsible adult stay with you until you are awake and alert.  Take  over-the-counter and prescription medicines only as told by your health care provider.  If you smoke, do not smoke without supervision.  Keep all follow-up visits as told by your health care provider. This is important. Contact a health care provider if:  You keep feeling nauseous or you keep vomiting.  You feel light-headed.  You develop a rash.  You have a fever. Get help right away if:  You have trouble breathing. This information is not intended to replace advice given to you by your health care provider. Make sure you discuss any questions you have with your health care provider. Document Released: 03/30/2016 Document Revised: 07/30/2016 Document Reviewed: 03/30/2016 Elsevier Interactive Patient Education  [employer].

## 2018-08-04 NOTE — Progress Notes (Signed)
Left voice mail for [facility] Dr. Tomie China Nurse from [facility] regarding follow up care after his PEG tube placement.

## 2018-08-04 NOTE — Interval H&P Note (Signed)
History and Physical Interval Note:  08/04/2018 9:59 AM  [name]  has presented today for surgery, with the diagnosis of tonsillar cancer  The various methods of treatment have been discussed with the patient and family. After consideration of risks, benefits and other options for treatment, the patient has consented to  Procedure(s): ESOPHAGOGASTRODUODENOSCOPY (EGD) WITH PROPOFOL (N/A) PERCUTANEOUS ENDOSCOPIC GASTROSTOMY (PEG) PLACEMENT (N/A) as a surgical intervention .  The patient's history has been reviewed, patient examined, no change in status, stable for surgery.  I have reviewed the patient's chart and labs.  Questions were answered to the patient's satisfaction.     Aviva Signs

## 2018-08-05 ENCOUNTER — Ambulatory Visit (HOSPITAL_COMMUNITY): Payer: Self-pay

## 2018-08-05 ENCOUNTER — Ambulatory Visit (INDEPENDENT_AMBULATORY_CARE_PROVIDER_SITE_OTHER): Payer: Medicare Other | Admitting: Gastroenterology

## 2018-08-05 ENCOUNTER — Encounter: Payer: Self-pay | Admitting: Internal Medicine

## 2018-08-05 ENCOUNTER — Encounter: Payer: Self-pay | Admitting: Gastroenterology

## 2018-08-05 VITALS — BP 119/65 | HR 75 | Temp 97.0°F | Ht 68.0 in | Wt 156.4 lb

## 2018-08-05 DIAGNOSIS — R195 Other fecal abnormalities: Secondary | ICD-10-CM

## 2018-08-05 NOTE — Progress Notes (Signed)
Primary Care Physician:  [name], DO Primary Gastroenterologist:  Dr. Gala Romney   Chief Complaint  Patient presents with  . +FIT    HPI:   [name] is a 67 y.o. male presenting today at the request of PCP secondary to heme positive stool. He was diagnosed with tonsillar cancer June 2019. Undergoing chemoradiation therapy. Devloped severe mucositis and weight loss while undergoing treatment. PEG tube placed actually yesterday by Dr. Arnoldo Morale.   Swallowing is painful. broth is painful. Tried to eat mac n cheese cups and could only eat 4 pieces of macarnoi. No food in several days. Drinking water. Sees nutrition tomorrow.   Halfway through radiation. Remote colonoscopy at least 15 years ago. Possible polyps but he states it was "good".   No lower GI symptoms. He is not interested in pursuing a colonoscopy until March/April 2020 due to overwhelming medical issues.     Past Medical History:  Diagnosis Date  . HOH (hard of hearing)   . Hyperlipidemia   . Mass of oral cavity 05/12/2018  . Positive FIT (fecal immunochemical test) 05/12/2018  . PTSD (post-traumatic stress disorder)    has not been offically diagnosised  . Tonsillar cancer ([place]) 06/02/2018    Past Surgical History:  Procedure Laterality Date  . APPENDECTOMY  1969  . COLONOSCOPY W/ POLYPECTOMY    . FINGER FRACTURE SURGERY Left   . MULTIPLE EXTRACTIONS WITH ALVEOLOPLASTY N/A 06/15/2018   Procedure: Extraction of tooth #'s 2,12,13,18,20,and 21 with alveoloplasty and gross debridement of remaining teeth;  Surgeon: Lenn Cal, DDS;  Location: [facility];  Service: Oral Surgery;  Laterality: N/A;  . PORTACATH PLACEMENT Left 07/02/2018   Procedure: INSERTION PORT-A-CATH;  Surgeon: Aviva Signs, MD;  Location: [facility];  Service: General;  Laterality: Left;    Current Outpatient Medications  Medication Sig Dispense Refill  . fluconazole (DIFLUCAN) 100 MG tablet Take by mouth.    Marland Kitchen ibuprofen (ADVIL,MOTRIN)  200 MG tablet Take 400 mg by mouth every 6 (six) hours as needed for moderate pain.     Marland Kitchen lidocaine-prilocaine (EMLA) cream Apply to affected area once 30 g 3  . Morphine Sulfate (MORPHINE CONCENTRATE) 10 mg / 0.5 ml concentrated solution Take 0.5 mLs (10 mg total) by mouth every 3 (three) hours as needed for severe pain. 60 mL 0  . oxyCODONE-acetaminophen (PERCOCET) 5-325 MG tablet Take one tablet by mouth every 4 hours as needed for pain. 30 tablet 0  . prochlorperazine (COMPAZINE) 10 MG tablet Take 1 tablet (10 mg total) by mouth every 6 (six) hours as needed (Nausea or vomiting). 30 tablet 1  . sodium fluoride (FLUORISHIELD) 1.1 % GEL dental gel Instill one drop of gel per tooth space of fluoride tray. Place over teeth for 5 minutes. Remove. Spit out excess. Repeat nightly. 120 mL prn  . sucralfate (CARAFATE) 1 GM/10ML suspension Carafate 1gm/49m & Viscous lidocaine 2% 1:1 mixture.  Swish and swallow 1 tablespoon four times a day. 360 mL 2   No current facility-administered medications for this visit.     Allergies as of 08/05/2018  . (No Known Allergies)    Family History  Problem Relation Age of Onset  . Diabetes Father     Social History   Socioeconomic History  . Marital status: Divorced    Spouse name: Not on file  . Number of children: Not on file  . Years of education: Not on file  . Highest education level: Not on file  Occupational History  . Occupation: English as a second language teacher, retired  Scientific laboratory technician  . Financial resource strain: Not on file  . Food insecurity:    Worry: Not on file    Inability: Not on file  . Transportation needs:    Medical: Not on file    Non-medical: Not on file  Tobacco Use  . Smoking status: Former Smoker    Years: 15.00    Types: Cigarettes    Last attempt to quit: 2000    Years since quitting: 19.6  . Smokeless tobacco: Never Used  Substance and Sexual Activity  . Alcohol use: Yes    Alcohol/week: 12.0 standard drinks    Types: 12 Cans of beer  per week  . Drug use: Never  . Sexual activity: Not Currently  Lifestyle  . Physical activity:    Days per week: Not on file    Minutes per session: Not on file  . Stress: Not on file  Relationships  . Social connections:    Talks on phone: Not on file    Gets together: Not on file    Attends religious service: Not on file    Active member of club or organization: Not on file    Attends meetings of clubs or organizations: Not on file    Relationship status: Not on file  . Intimate partner violence:    Fear of current or ex partner: Not on file    Emotionally abused: Not on file    Physically abused: Not on file    Forced sexual activity: Not on file  Other Topics Concern  . 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  He lives independently with his dog here in [place].  He formally lived in [place].    Review of Systems: As mentioned in HPI   Physical Exam: BP 119/65   Pulse 75   Temp (!) 97 F (36.1 C) (Oral)   Ht _0  (1.727 m)   Wt 156 lb 6.4 oz (70.9 kg)   BMI 23.78 kg/m  General:   Alert and oriented. Pleasant and cooperative. Well-nourished and well-developed.  Head:  Normocephalic and atraumatic. Eyes:  Without icterus, sclera clear and conjunctiva pink.  Ears:  HOH  Nose:  No deformity, discharge,  or lesions. Lungs:  Clear to auscultation bilaterally.  Heart:  S1, S2 present without murmurs appreciated.  Abdomen:  +BS, soft, non-tender and non-distended. No HSM noted. No guarding or rebound. No masses appreciated. PEG tube intact Rectal:  Deferred  Msk:  Symmetrical without gross deformities. Normal posture. Extremities:  Without  edema. Neurologic:  Alert and  oriented x4 Psych:  Alert and cooperative. Normal mood and affect.

## 2018-08-05 NOTE — Assessment & Plan Note (Signed)
67 year old male with history of heme positive stool but no overt GI bleeding or changes in bowel habits. Unfortunately, he was found to have tonsillar cancer in June 2019 and has been undergoing chemoradiation. PEG tube placed yesterday by Dr. Arnoldo Morale due to severe mucositis. He is wanting to hold off on a colonoscopy currently and is fully aware of possibility of occult malignancy, advanced polyp, etc. Understands risks and benefits. He desires to return in March/April 2020. We will arrange a follow-up for him at that time.

## 2018-08-05 NOTE — Patient Instructions (Signed)
We will send a reminder to come in March/April to discuss colonoscopy.   It was good to meet you!  It was a pleasure to see you today. I strive to create trusting relationships with patients to provide genuine, compassionate, and quality care. I value your feedback. If you receive a survey regarding your visit,  I greatly appreciate you taking time to fill this out.   Annitta Needs, PhD, ANP-BC [facility]

## 2018-08-06 ENCOUNTER — Encounter (HOSPITAL_COMMUNITY): Payer: Self-pay

## 2018-08-06 ENCOUNTER — Inpatient Hospital Stay (HOSPITAL_COMMUNITY): Payer: Medicare Other

## 2018-08-06 NOTE — Addendum Note (Signed)
Addended by: Glennie Isle on: 08/06/2018 03:45 PM   Modules accepted: Orders

## 2018-08-06 NOTE — Progress Notes (Signed)
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  Patient undergoing chemotherapy and radiation therapy.    Patient 1 hour late from appointment time slot.  "I got mixed up on the time."  Reports that he was able to drink some broth without difficulty last night.  Sipping on water during visit. Reports that over the past few weeks has not been able to take much food in orally.  Tearful during visit saying "I couldn't even drink the ensure you gave me and I feel so bad." "It was just too thick."   PEG tube placed on 8/14 by Dr. Arnoldo Morale.  Medications: reviewed  Labs: reviewed  Anthropometrics:   Weight 156 lb noted on 8/14 decreased from 175 lb 12 oz on 7/25.     Estimated Energy Needs  Kcals: 2300-2500 calories Protein: 100-115 g Fluid: > 2.3 L/d  NUTRITION DIAGNOSIS: Predicted suboptimal nutrition continues   MALNUTRITION DIAGNOSIS: Patient meets criteria for severe malnutrition in context of acute illness likely progressing to chronic as evidenced by 11% weight loss in 3 weeks and eating < 50% energy needs for > or equal to 5 days   INTERVENTION:  Reviewed PEG care and how to administer feeding via PEG tube.   Patient was able to return demonstration with sample feeding tube.   Discussed tube feeding regimen as patient at risk for refeeding syndrome with decreased intake and weight loss.  Recommend today and tomorrow ([facility] RN to touch base with patient this afternoon).  Start with 1/2 carton of osmolite 1.5 at 8am, 12, 4pm and 8pm. Water flush 23ml before and after feeding.  Additional 233ml water flush to be given between feedings via feeding tube or patient to drink orally.  Patient verbalized understanding and hand written instructions given. Sunday patient to give 1 full carton of osmolite 1.5 at 8am feeding all other feedings will be 1/2 carton.  Flush the same. Hand written instructions given Monday patient to give 1 full  carton at 8am and 12 noon other 2 feedings 1/2 carton.  Flush the same, Hand written instructions given Further titration of tube feeding to be discussed by RD on follow-up visit (Tuesday, August 20).   Recommend checking CMET, Mag and Phosphorus on Monday, Aug 19 as patient at risk of refeeding syndrome.  Discussed with Dr. Raliegh Ip today in clinic.   Patient verbalized understanding of PEG feeding  MONITORING, EVALUATION, GOAL: Patient will utilize PEG tube to improve nutrition and maintain weight during treatments   NEXT VISIT: August 20 during infusion with Ovid Curd, RD  Desmond Lope B. Zenia Resides, Malcolm, Vivian Registered Dietitian [phone] (pager)

## 2018-08-09 ENCOUNTER — Inpatient Hospital Stay (HOSPITAL_COMMUNITY): Payer: Medicare Other

## 2018-08-09 ENCOUNTER — Encounter (HOSPITAL_COMMUNITY): Payer: Self-pay | Admitting: Hematology

## 2018-08-09 ENCOUNTER — Inpatient Hospital Stay (HOSPITAL_BASED_OUTPATIENT_CLINIC_OR_DEPARTMENT_OTHER): Payer: Medicare Other | Admitting: Hematology

## 2018-08-09 VITALS — BP 108/59 | HR 75 | Temp 98.2°F | Resp 18 | Wt 157.5 lb

## 2018-08-09 DIAGNOSIS — T451X5A Adverse effect of antineoplastic and immunosuppressive drugs, initial encounter: Principal | ICD-10-CM

## 2018-08-09 DIAGNOSIS — D701 Agranulocytosis secondary to cancer chemotherapy: Secondary | ICD-10-CM

## 2018-08-09 DIAGNOSIS — K1379 Other lesions of oral mucosa: Secondary | ICD-10-CM | POA: Diagnosis not present

## 2018-08-09 DIAGNOSIS — Z7689 Persons encountering health services in other specified circumstances: Secondary | ICD-10-CM

## 2018-08-09 DIAGNOSIS — R5383 Other fatigue: Secondary | ICD-10-CM

## 2018-08-09 DIAGNOSIS — C099 Malignant neoplasm of tonsil, unspecified: Secondary | ICD-10-CM

## 2018-08-09 DIAGNOSIS — Z87891 Personal history of nicotine dependence: Secondary | ICD-10-CM

## 2018-08-09 DIAGNOSIS — Z5111 Encounter for antineoplastic chemotherapy: Secondary | ICD-10-CM | POA: Diagnosis not present

## 2018-08-09 DIAGNOSIS — Z79899 Other long term (current) drug therapy: Secondary | ICD-10-CM

## 2018-08-09 DIAGNOSIS — I129 Hypertensive chronic kidney disease with stage 1 through stage 4 chronic kidney disease, or unspecified chronic kidney disease: Secondary | ICD-10-CM

## 2018-08-09 DIAGNOSIS — R634 Abnormal weight loss: Secondary | ICD-10-CM

## 2018-08-09 DIAGNOSIS — E785 Hyperlipidemia, unspecified: Secondary | ICD-10-CM

## 2018-08-09 DIAGNOSIS — N189 Chronic kidney disease, unspecified: Secondary | ICD-10-CM

## 2018-08-09 LAB — CBC WITH DIFFERENTIAL/PLATELET
Basophils Absolute: 0 10*3/uL (ref 0.0–0.1)
Basophils Relative: 1 %
EOS ABS: 0 10*3/uL (ref 0.0–0.7)
Eosinophils Relative: 1 %
HCT: 37.1 % — ABNORMAL LOW (ref 39.0–52.0)
Hemoglobin: 12.2 g/dL — ABNORMAL LOW (ref 13.0–17.0)
LYMPHS ABS: 0.3 10*3/uL — AB (ref 0.7–4.0)
Lymphocytes Relative: 22 %
MCH: 30.7 pg (ref 26.0–34.0)
MCHC: 32.9 g/dL (ref 30.0–36.0)
MCV: 93.2 fL (ref 78.0–100.0)
MONO ABS: 0.2 10*3/uL (ref 0.1–1.0)
MONOS PCT: 12 %
Neutro Abs: 1 10*3/uL — ABNORMAL LOW (ref 1.7–7.7)
Neutrophils Relative %: 64 %
PLATELETS: 147 10*3/uL — AB (ref 150–400)
RBC: 3.98 MIL/uL — ABNORMAL LOW (ref 4.22–5.81)
RDW: 14 % (ref 11.5–15.5)
WBC: 1.6 10*3/uL — ABNORMAL LOW (ref 4.0–10.5)

## 2018-08-09 LAB — COMPREHENSIVE METABOLIC PANEL
ALBUMIN: 3.5 g/dL (ref 3.5–5.0)
ALK PHOS: 52 U/L (ref 38–126)
ALT: 19 U/L (ref 0–44)
ANION GAP: 11 (ref 5–15)
AST: 23 U/L (ref 15–41)
BUN: 17 mg/dL (ref 8–23)
CALCIUM: 9.3 mg/dL (ref 8.9–10.3)
CHLORIDE: 98 mmol/L (ref 98–111)
CO2: 32 mmol/L (ref 22–32)
Creatinine, Ser: 0.99 mg/dL (ref 0.61–1.24)
GFR calc non Af Amer: >60 mL/min (ref >60)
Glucose, Bld: 135 mg/dL — ABNORMAL HIGH (ref 70–99)
POTASSIUM: 3.3 mmol/L — AB (ref 3.5–5.1)
SODIUM: 141 mmol/L (ref 135–145)
Total Bilirubin: 0.9 mg/dL (ref 0.3–1.2)
Total Protein: 6.9 g/dL (ref 6.5–8.1)

## 2018-08-09 LAB — PHOSPHORUS: PHOSPHORUS: 2.9 mg/dL (ref 2.5–4.6)

## 2018-08-09 LAB — MAGNESIUM: MAGNESIUM: 2.1 mg/dL (ref 1.7–2.4)

## 2018-08-09 MED ORDER — FILGRASTIM 300 MCG/0.5ML IJ SOSY
300.0000 ug | PREFILLED_SYRINGE | Freq: Once | INTRAMUSCULAR | Status: AC
  Administered 2018-08-09: 300 ug via SUBCUTANEOUS
  Filled 2018-08-09: qty 0.5

## 2018-08-09 NOTE — Patient Instructions (Signed)
[place] at [facility] Discharge Instructions  Receivd Neupogen injection today. Follow-up as scheduled. Call clinic for any questions or concerns   Thank you for choosing [place] at [facility] to provide your oncology and hematology care.  To afford each patient quality time with our provider, please arrive at least 15 minutes before your scheduled appointment time.   If you have a lab appointment with the [facility] please come in thru the  Main Entrance and check in at the main information desk  You need to re-schedule your appointment should you arrive 10 or more minutes late.  We strive to give you quality time with our providers, and arriving late affects you and other patients whose appointments are after yours.  Also, if you no show three or more times for appointments you may be dismissed from the clinic at the providers discretion.     Again, thank you for choosing [facility].  Our hope is that these requests will decrease the amount of time that you wait before being seen by our physicians.       _____________________________________________________________  Should you have questions after your visit to [facility], please contact our office at [phone] between the hours of 8:00 a.m. and 4:30 p.m.  Voicemails left after 4:00 p.m. will not be returned until the following business day.  For prescription refill requests, have your pharmacy contact our office and allow 72 hours.    Cancer Center Support Programs:   > Cancer Support Group  2nd Tuesday of the month 1pm-2pm, Journey Room

## 2018-08-09 NOTE — Assessment & Plan Note (Signed)
1.  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 Benjamine Mola on 05/13/2018, status post biopsy consistent with squamous cell carcinoma, P 16+ -Weight loss of 16 pounds since the start of treatment. - Baseline hearing loss and chronic kidney disease with creatinine of 1.34. - Oropharyngeal examination shows a necrotic left tonsillar mass, not clearly visualized secondary to increased gag reflex.  There is a left lower neck lymph node palpable.  I discussed the results of the PET CT scan with the patient which showed level 2, 3, 5 meta stasis on the left neck and mildly metabolic right level 2 lymph node along with the left tonsillar primary.  No other hypermetabolic lesion seen. -I have recommended combination chemoradiation therapy.  He is not a candidate for high-dose cisplatin given his chronic kidney disease and hearing dysfunction.  He is also not a candidate for weekly cisplatin as his creatinine might get worse.  I have recommended 94-01 [place] protocol with 5-FU administered as a 24-hour continuous infusion at a dose of 600 mg per M square per day for 4 days and carboplatin given as a daily bolus of 70 mg/m2/day for 4 days.  Chemo will be given on days 1, 22 and 43.  We will have a port placed by 1 of our surgeons. - He received first cycle of carboplatin and 5-FU on 07/13/2018. - He has developed severe mucositis and lost about 16 pounds since the start of therapy.  He is using Maalox with lidocaine which is not completely helping him.  I will start him on morphine liquid 10 mg to be taken every 2-3 hours. - Had a PEG tube placed on 08/04/2018.  He is taking in 4 cans of Osmolite as bolus.  He is also drinking about 2 cans of Ensure per day.  He is not able to eat any soft foods.  He does not have any severe pain while swallowing. - He will pick up morphine liquid for pain today. -I went over  his blood work.  His ANC is 1200.  Lately count recovered.  He will receive Neupogen 300 mcg today.  He will start his cycle 2 tomorrow.

## 2018-08-09 NOTE — Progress Notes (Signed)
[place], [place] [address]   CLINIC:  Medical Oncology/Hematology  PCP:  [name], DO [place] [address] [phone]   REASON FOR VISIT: Follow-up for tonsillar cancer  CURRENT THERAPY: carboplatin and 5FU  BRIEF ONCOLOGIC HISTORY:    Malignant neoplasm of tonsil ([place])   06/02/2018 Initial Diagnosis    Tonsillar cancer ([place])    06/15/2018 -  Chemotherapy    The patient had palonosetron (ALOXI) injection 0.25 mg, 0.25 mg, Intravenous,  Once, 1 of 3 cycles Administration: 0.25 mg (07/13/2018), 0.25 mg (07/15/2018) CARBOplatin (PARAPLATIN) 140 mg in sodium chloride 0.9 % 100 mL chemo infusion, 70 mg/m2 = 140 mg (100 % of original dose 70 mg/m2), Intravenous,  Once, 1 of 3 cycles Dose modification: 70 mg/m2 (original dose 70 mg/m2, Cycle 1, Reason: Other (see comments), Comment: french protocol head and neck) Administration: 140 mg (07/13/2018), 140 mg (07/14/2018), 140 mg (07/15/2018), 140 mg (07/19/2018) fluorouracil (ADRUCIL) 4,750 mg in sodium chloride 0.9 % 55 mL chemo infusion, 600 mg/m2/day = 4,750 mg (100 % of original dose 600 mg/m2/day), Intravenous, 4D (96 hours ), 1 of 3 cycles Dose modification: 600 mg/m2/day (original dose 600 mg/m2/day, Cycle 1, Reason: Other (see comments), Comment: french protocol head and neck) Administration: 4,750 mg (07/13/2018)  for chemotherapy treatment.       CANCER STAGING: Cancer Staging Malignant neoplasm of tonsil ([place]) Staging form: Pharynx - HPV-Mediated Oropharynx, AJCC 8th Edition - Clinical stage from 06/02/2018: Stage II (cT3, cN2, cM0) - Unsigned    INTERVAL HISTORY:  Mr. Krapf 67 y.o. male returns for routine follow-up for tonsillar cancer and consideration for next cycle of chemotherapy. Patient is here today after his radiation appointment. He is doing his tube feeds 4 times a day and drinking 2 boosts throughout the day to help him maintain his weight. He is  unable to eat any solid foods at this time. He is having mild fatigue throughout the day. Patient lives at home and performs all his own ADLs. Patient appetite is 25%. He reports his energy level at 50%.    REVIEW OF SYSTEMS:  Review of Systems  Constitutional: Positive for fatigue.  HENT:   Positive for trouble swallowing.   Gastrointestinal: Positive for constipation.  All other systems reviewed and are negative.    PAST MEDICAL/SURGICAL HISTORY:  Past Medical History:  Diagnosis Date  . HOH (hard of hearing)   . Hyperlipidemia   . Mass of oral cavity 05/12/2018  . Positive FIT (fecal immunochemical test) 05/12/2018  . PTSD (post-traumatic stress disorder)    has not been offically diagnosised  . Tonsillar cancer ([place]) 06/02/2018   Past Surgical History:  Procedure Laterality Date  . APPENDECTOMY  1969  . COLONOSCOPY W/ POLYPECTOMY     remote past  . ESOPHAGOGASTRODUODENOSCOPY  08/04/2018   Dr. Arnoldo Morale: erythema of larynx, normal stomach, normal duodenum. PEG Placed 20 F, bumper at 2.5 cm  . ESOPHAGOGASTRODUODENOSCOPY (EGD) WITH PROPOFOL N/A 08/04/2018   Procedure: ESOPHAGOGASTRODUODENOSCOPY (EGD) WITH PROPOFOL;  Surgeon: Aviva Signs, MD;  [facility];  Service: Gastroenterology;  Laterality: N/A;  . FINGER FRACTURE SURGERY Left   . MULTIPLE EXTRACTIONS WITH ALVEOLOPLASTY N/A 06/15/2018   Procedure: Extraction of tooth #'s 2,12,13,18,20,and 21 with alveoloplasty and gross debridement of remaining teeth;  Surgeon: Lenn Cal, DDS;  Location: [facility];  Service: Oral Surgery;  Laterality: N/A;  . PEG PLACEMENT N/A 08/04/2018   Procedure: PERCUTANEOUS  ENDOSCOPIC GASTROSTOMY (PEG) PLACEMENT;  Surgeon: Aviva Signs, MD;  Location: [facility];  Service: Gastroenterology;  Laterality: N/A;  . PORTACATH PLACEMENT Left 07/02/2018   Procedure: INSERTION PORT-A-CATH;  Surgeon: Aviva Signs, MD;  Location: [facility];  Service: General;  Laterality: Left;     SOCIAL  HISTORY:  Social History   Socioeconomic History  . Marital status: Divorced    Spouse name: Not on file  . Number of children: Not on file  . Years of education: Not on file  . Highest education level: Not on file  Occupational History  . Occupation: English as a second language teacher, retired  Scientific laboratory technician  . Financial resource strain: Not on file  . Food insecurity:    Worry: Not on file    Inability: Not on file  . Transportation needs:    Medical: Not on file    Non-medical: Not on file  Tobacco Use  . Smoking status: Former Smoker    Years: 15.00    Types: Cigarettes    Last attempt to quit: 2000    Years since quitting: 19.6  . Smokeless tobacco: Never Used  Substance and Sexual Activity  . Alcohol use: Yes    Alcohol/week: 12.0 standard drinks    Types: 12 Cans of beer per week  . Drug use: Never  . Sexual activity: Not Currently  Lifestyle  . Physical activity:    Days per week: Not on file    Minutes per session: Not on file  . Stress: Not on file  Relationships  . Social connections:    Talks on phone: Not on file    Gets together: Not on file    Attends religious service: Not on file    Active member of club or organization: Not on file    Attends meetings of clubs or organizations: Not on file    Relationship status: Not on file  . Intimate partner violence:    Fear of current or ex partner: Not on file    Emotionally abused: Not on file    Physically abused: Not on file    Forced sexual activity: Not on file  Other Topics Concern  . Not on file  Social History Narrative   Mr. Shiller is a pleasant gentleman who is a retired English as a second language teacher.  He lives independently with his dog here in [place].  He formally lived in [place].    FAMILY HISTORY:  Family History  Problem Relation Age of Onset  . Diabetes Father        died at age 19   . Colon cancer Neg Hx     CURRENT MEDICATIONS:  Outpatient Encounter Medications as of 08/09/2018  Medication Sig  . fluconazole (DIFLUCAN) 100  MG tablet Take by mouth.  Marland Kitchen ibuprofen (ADVIL,MOTRIN) 200 MG tablet Take 400 mg by mouth every 6 (six) hours as needed for moderate pain.   Marland Kitchen lidocaine-prilocaine (EMLA) cream Apply to affected area once  . Morphine Sulfate (MORPHINE CONCENTRATE) 10 mg / 0.5 ml concentrated solution Take 0.5 mLs (10 mg total) by mouth every 3 (three) hours as needed for severe pain.  Marland Kitchen oxyCODONE-acetaminophen (PERCOCET) 5-325 MG tablet Take one tablet by mouth every 4 hours as needed for pain.  Marland Kitchen prochlorperazine (COMPAZINE) 10 MG tablet Take 1 tablet (10 mg total) by mouth every 6 (six) hours as needed (Nausea or vomiting).  . sodium fluoride (FLUORISHIELD) 1.1 % GEL dental gel Instill one drop of gel per tooth space of fluoride tray. Place over  teeth for 5 minutes. Remove. Spit out excess. Repeat nightly.  . sucralfate (CARAFATE) 1 GM/10ML suspension Carafate 1gm/40ml & Viscous lidocaine 2% 1:1 mixture.  Swish and swallow 1 tablespoon four times a day.  . [EXPIRED] filgrastim (NEUPOGEN) injection 300 mcg    No facility-administered encounter medications on file as of 08/09/2018.     ALLERGIES:  No Known Allergies   PHYSICAL EXAM:  ECOG Performance status: 1  Vitals:   08/09/18 1100  BP: (!) 108/59  Pulse: 75  Resp: 18  Temp: 98.2 F (36.8 C)  SpO2: 100%   Filed Weights   08/09/18 1100  Weight: 157 lb 8 oz (71.4 kg)    Physical Exam  Constitutional: He is oriented to person, place, and time. He appears well-developed and well-nourished.  Cardiovascular: Normal rate, regular rhythm and normal heart sounds.  Pulmonary/Chest: Effort normal and breath sounds normal.  Neurological: He is alert and oriented to person, place, and time.  Skin: Skin is warm and dry.     LABORATORY DATA:  I have reviewed the labs as listed.  CBC    Component Value Date/Time   WBC 1.6 (L) 08/09/2018 1056   RBC 3.98 (L) 08/09/2018 1056   HGB 12.2 (L) 08/09/2018 1056   HGB 14.2 05/12/2018 0949   HCT 37.1 (L)  08/09/2018 1056   HCT 42.0 05/12/2018 0949   PLT 147 (L) 08/09/2018 1056   PLT 256 05/12/2018 0949   MCV 93.2 08/09/2018 1056   MCV 87 05/12/2018 0949   MCH 30.7 08/09/2018 1056   MCHC 32.9 08/09/2018 1056   RDW 14.0 08/09/2018 1056   RDW 13.0 05/12/2018 0949   LYMPHSABS 0.3 (L) 08/09/2018 1056   LYMPHSABS 1.1 05/12/2018 0949   MONOABS 0.2 08/09/2018 1056   EOSABS 0.0 08/09/2018 1056   EOSABS 0.1 05/12/2018 0949   BASOSABS 0.0 08/09/2018 1056   BASOSABS 0.0 05/12/2018 0949   CMP Latest Ref Rng & Units 08/09/2018 08/03/2018 08/02/2018  Glucose 70 - 99 mg/dL 135(H) 87 120(H)  BUN 8 - 23 mg/dL 17 24(H) 23  Creatinine 0.61 - 1.24 mg/dL 0.99 0.99 1.03  Sodium 135 - 145 mmol/L 141 139 140  Potassium 3.5 - 5.1 mmol/L 3.3(L) 3.3(L) 3.4(L)  Chloride 98 - 111 mmol/L 98 98 95(L)  CO2 22 - 32 mmol/L 32 29 33(H)  Calcium 8.9 - 10.3 mg/dL 9.3 9.1 9.4  Total Protein 6.5 - 8.1 g/dL 6.9 7.1 7.6  Total Bilirubin 0.3 - 1.2 mg/dL 0.9 1.8(H) 1.3(H)  Alkaline Phos 38 - 126 U/L 52 57 58  AST 15 - 41 U/L 23 22 21   ALT 0 - 44 U/L 19 19 17           ASSESSMENT & PLAN:   Malignant neoplasm of tonsil ([facility]) 1.  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 Benjamine Mola on 05/13/2018, status post biopsy consistent with squamous cell carcinoma, P 16+ -Weight loss of 16 pounds since the start of treatment. - Baseline hearing loss and chronic kidney disease with creatinine of 1.34. - Oropharyngeal examination shows a necrotic left tonsillar mass, not clearly visualized secondary to increased gag reflex.  There is a left lower neck lymph node palpable.  I discussed the results of the PET CT scan with the patient which showed level 2, 3, 5 meta stasis on the left neck and mildly metabolic right level 2 lymph node along with the  left tonsillar primary.  No other hypermetabolic lesion seen. -I have  recommended combination chemoradiation therapy.  He is not a candidate for high-dose cisplatin given his chronic kidney disease and hearing dysfunction.  He is also not a candidate for weekly cisplatin as his creatinine might get worse.  I have recommended 94-01 [place] protocol with 5-FU administered as a 24-hour continuous infusion at a dose of 600 mg per M square per day for 4 days and carboplatin given as a daily bolus of 70 mg/m2/day for 4 days.  Chemo will be given on days 1, 22 and 43.  We will have a port placed by 1 of our surgeons. - He received first cycle of carboplatin and 5-FU on 07/13/2018. - He has developed severe mucositis and lost about 16 pounds since the start of therapy.  He is using Maalox with lidocaine which is not completely helping him.  I will start him on morphine liquid 10 mg to be taken every 2-3 hours. - Had a PEG tube placed on 08/04/2018.  He is taking in 4 cans of Osmolite as bolus.  He is also drinking about 2 cans of Ensure per day.  He is not able to eat any soft foods.  He does not have any severe pain while swallowing. - He will pick up morphine liquid for pain today. -I went over his blood work.  His ANC is 1200.  Lately count recovered.  He will receive Neupogen 300 mcg today.  He will start his cycle 2 tomorrow.      Orders placed this encounter:  Orders Placed This Encounter  Procedures  . CBC with Differential/Platelet  . Comprehensive metabolic panel      Derek Jack, MD [place] [phone]

## 2018-08-09 NOTE — Progress Notes (Signed)
CC'D TO PCP °

## 2018-08-09 NOTE — Patient Instructions (Signed)
[facility] at [facility] Discharge Instructions     Thank you for choosing [facility] at [facility] to provide your oncology and hematology care.  To afford each patient quality time with our provider, please arrive at least 15 minutes before your scheduled appointment time.   If you have a lab appointment with the Cancer Center please come in thru the  Main Entrance and check in at the main information desk  You need to re-schedule your appointment should you arrive 10 or more minutes late.  We strive to give you quality time with our providers, and arriving late affects you and other patients whose appointments are after yours.  Also, if you no show three or more times for appointments you may be dismissed from the clinic at the providers discretion.     Again, thank you for choosing  Cancer Center.  Our hope is that these requests will decrease the amount of time that you wait before being seen by our physicians.       _____________________________________________________________  Should you have questions after your visit to  Cancer Center, please contact our office at [phone] between the hours of 8:00 a.m. and 4:30 p.m.  Voicemails left after 4:00 p.m. will not be returned until the following business day.  For prescription refill requests, have your pharmacy contact our office and allow 72 hours.    Cancer Center Support Programs:   > Cancer Support Group  2nd Tuesday of the month 1pm-2pm, Journey Room    

## 2018-08-09 NOTE — Progress Notes (Signed)
LENORRIS KARGER tolerated Neupogen injection well without complaints or incident. Labs reviewed with and pt seen by Dr. Delton Coombes today with chemo tx held due to low [facility] and Neupogen injection ordered per MD. Pt to return tomorrow for repeat labs with possible chemo tx per MD. Pt informed of this information and verbalized understanding. Pt discharged self ambulatory in satisfactory condition

## 2018-08-10 ENCOUNTER — Inpatient Hospital Stay (HOSPITAL_COMMUNITY): Payer: Medicare Other

## 2018-08-10 ENCOUNTER — Encounter (HOSPITAL_COMMUNITY): Payer: Self-pay

## 2018-08-10 ENCOUNTER — Inpatient Hospital Stay (HOSPITAL_COMMUNITY): Payer: Medicare Other | Admitting: Dietician

## 2018-08-10 VITALS — BP 102/56 | HR 58 | Temp 98.4°F | Resp 18

## 2018-08-10 DIAGNOSIS — C099 Malignant neoplasm of tonsil, unspecified: Secondary | ICD-10-CM

## 2018-08-10 LAB — COMPREHENSIVE METABOLIC PANEL
ALBUMIN: 3.4 g/dL — AB (ref 3.5–5.0)
ALT: 19 U/L (ref 0–44)
AST: 22 U/L (ref 15–41)
Alkaline Phosphatase: 55 U/L (ref 38–126)
Anion gap: 6 (ref 5–15)
BUN: 15 mg/dL (ref 8–23)
CHLORIDE: 99 mmol/L (ref 98–111)
CO2: 33 mmol/L — ABNORMAL HIGH (ref 22–32)
CREATININE: 1.02 mg/dL (ref 0.61–1.24)
Calcium: 9.1 mg/dL (ref 8.9–10.3)
GFR calc Af Amer: >60 mL/min (ref >60)
GLUCOSE: 127 mg/dL — AB (ref 70–99)
POTASSIUM: 3.3 mmol/L — AB (ref 3.5–5.1)
Sodium: 138 mmol/L (ref 135–145)
Total Bilirubin: 1 mg/dL (ref 0.3–1.2)
Total Protein: 6.6 g/dL (ref 6.5–8.1)

## 2018-08-10 LAB — CBC WITH DIFFERENTIAL/PLATELET
Basophils Absolute: 0 10*3/uL (ref 0.0–0.1)
Basophils Relative: 0 %
EOS ABS: 0 10*3/uL (ref 0.0–0.7)
EOS PCT: 0 %
HEMATOCRIT: 37.1 % — AB (ref 39.0–52.0)
Hemoglobin: 12.1 g/dL — ABNORMAL LOW (ref 13.0–17.0)
LYMPHS ABS: 0.4 10*3/uL — AB (ref 0.7–4.0)
LYMPHS PCT: 3 %
MCH: 30.6 pg (ref 26.0–34.0)
MCHC: 32.6 g/dL (ref 30.0–36.0)
MCV: 93.9 fL (ref 78.0–100.0)
MONO ABS: 0.4 10*3/uL (ref 0.1–1.0)
Monocytes Relative: 3 %
Neutro Abs: 10.3 10*3/uL (ref 1.7–7.7)
Neutrophils Relative %: 94 %
PLATELETS: 152 10*3/uL (ref 150–400)
RBC: 3.95 MIL/uL — ABNORMAL LOW (ref 4.22–5.81)
RDW: 14.4 % (ref 11.5–15.5)
WBC Morphology: INCREASED
WBC: 11 10*3/uL — AB (ref 4.0–10.5)

## 2018-08-10 MED ORDER — SODIUM CHLORIDE 0.9% FLUSH
10.0000 mL | INTRAVENOUS | Status: DC | PRN
  Administered 2018-08-10: 10 mL
  Filled 2018-08-10: qty 10

## 2018-08-10 MED ORDER — SODIUM CHLORIDE 0.9 % IV SOLN
70.0000 mg/m2 | Freq: Once | INTRAVENOUS | Status: AC
  Administered 2018-08-10: 140 mg via INTRAVENOUS
  Filled 2018-08-10: qty 14

## 2018-08-10 MED ORDER — SODIUM CHLORIDE 0.9 % IV SOLN
600.0000 mg/m2/d | INTRAVENOUS | Status: DC
  Administered 2018-08-10: 4750 mg via INTRAVENOUS
  Filled 2018-08-10: qty 95

## 2018-08-10 MED ORDER — SODIUM CHLORIDE 0.9 % IV SOLN: 10.0000 mg | Freq: Once | INTRAVENOUS | Status: DC

## 2018-08-10 MED ORDER — SODIUM CHLORIDE 0.9 % IV SOLN
Freq: Once | INTRAVENOUS | Status: AC
  Administered 2018-08-10: 12:00:00 via INTRAVENOUS

## 2018-08-10 MED ORDER — DEXAMETHASONE SODIUM PHOSPHATE 10 MG/ML IJ SOLN
10.0000 mg | Freq: Once | INTRAMUSCULAR | Status: AC
  Administered 2018-08-10: 10 mg via INTRAVENOUS
  Filled 2018-08-10: qty 1

## 2018-08-10 MED ORDER — PALONOSETRON HCL INJECTION 0.25 MG/5ML
0.2500 mg | Freq: Once | INTRAVENOUS | Status: AC
  Administered 2018-08-10: 0.25 mg via INTRAVENOUS
  Filled 2018-08-10: qty 5

## 2018-08-10 NOTE — Progress Notes (Addendum)
Nutrition Follow-up 67 y/o male PMHx HLD, HOH, CKD, w/ little recent medical follow up. Presented to [facility] 6/12 after referred by local ENT MD for large L tonsillar mass s/p biopsy. + for SCC. At time of presentation, had dysphagia and 5 lb loss x1 month. Begun radiation 7/22 and chemo 7/23. Developed severe mucositis, dysphagia and odynophagia losing ~16 lbs since starting therapy. S/P peg placement 8/14 and start of Tube feeding 8/16.   RD had received handoff report from RD who saw patient Friday. Patient had been instructed to wean up to a point where today he would be administering 4 feeds qd in specified amounts of: 1/2 can-1 can-1can-1/2 can, w/ flush of 60 cc before and after. Also had been instructed to bolus 240 cc free water TID.   Today, patient reports he is still only doing 1/2 can QID. He does report flushing 60 ml before/after each feed and also doing the 240 ml fluid bolus TID. He does his feeds at 8-12-4-8.   PO intake wise, he is still eating, but exhibits significant odynophagia. He says "it is all I can do to get Ensure down". He sounds to be drinking ~2-3 of these per day. He has a fridge full of icecream, but he is experiencing dysgeusia and has decreased intake of this. He asks if he can put Ensure through tube.  Today, he says he "wants to keep eating because I know it will help me maintain my swallow function, but.Marland KitchenMarland KitchenI have great fear of it going into my lungs". On further questioning, he reports worsening swallow function.  He says he has to turn his head a certain way to safely swallow. He reports trismus. He says he has been unable to use his mouth expander to prevent trismus because his mouth no longer opens far enough to fit the device. He says he cannot modify the size of the device either. He shows how he uses his fingers to push open his mouth as a substitute. He had an initial consult with SLP, but has not been for follow up due to inability to pay Copay.   He reports  SIGNIFICANT financial strain. He has needed gas money. He does not have mediccaid and says he has 20 k worth of tools he will not get rid of. Because of no money, he has not been able to pay the copays for meds, including his ordered liquid morphine-hence he struggling significantly with pain and this makes intake even harder.   He denies any s/s of intolerance to tube feeds. He "had a good constitution" this morning. Texture is intermittently loose, but mostly of regular consistency. No diarrhea. No n/v or distension.   Labs and weight reviewed. Weight unchanged x1 week. Labs do not show any concern of refeeding syndrome.   Tube site clean. No drainage. Mildly sore.   Wt Readings from Last 10 Encounters:  08/09/18 157 lb 8 oz (71.4 kg)  08/05/18 156 lb 6.4 oz (70.9 kg)  08/03/18 157 lb 9.6 oz (71.5 kg)  08/03/18 158 lb (71.7 kg)  08/02/18 159 lb 12.8 oz (72.5 kg)  07/19/18 174 lb (78.9 kg)  07/15/18 175 lb 12.8 oz (79.7 kg)  07/13/18 175 lb 6.4 oz (79.6 kg)  07/12/18 175 lb 12.8 oz (79.7 kg)  06/29/18 176 lb (79.8 kg)   MEDICATIONS: Chemo: 5FU Other Meds: Liquid morphine, Percocet, Carafate, Compazine  LABS:  K: 3.3, Albumin:3.4, Phos:2.9, Mag 2.1 Creat: 1.02-stable x1 wk.   Recent Labs  Lab 08/09/18  1056 08/10/18 1037  NA 141 138  K 3.3* 3.3*  CL 98 99  CO2 32 33*  BUN 17 15  CREATININE 0.99 1.02  CALCIUM 9.3 9.1  MG 2.1  --   PHOS 2.9  --   GLUCOSE 135* 127*   ANTHROPOMETRICS: Height:  Ht Readings from Last 1 Encounters:  08/05/18 5\' 8"  (1.727 m)   Weight:  Wt Readings from Last 1 Encounters:  08/09/18 157 lb 8 oz (71.4 kg)   BMI:  BMI Readings from Last 1 Encounters:  08/09/18 23.95 kg/m   UBW: 182 when presenting In June IBW: 70 kg  Estimated needs:  Energy: 2300-2500 kcals (32-35 kcal/kg bw) Protein: 100-115g Pro (1.4-1.6 g/kg bw) Fluid: >2.3 L fluid (1 ml/kcal)  NUTRITION DIAGNOSIS:  Inadequate oral intake related to side effects of  chemoradiation as patient report of minimal PO intake and need for PEG   DOCUMENTATION CODES:  Severe malnutrition->resolving with initiation of tube feeding.   INTERVENTION:  Pt had not advanced TF to point RD had hoped. He is only doing ~2 cans/day at this time.   RD asked patient to double his two feeds in the middle of the day (go from 2 syringes of feeding to 4 syringes). In response to this he says "do you think my tummy can handle that?". RD reiterated this is only 12 oz including flushes. RD explained new regimen many times to ensure understanding. RD instructed to continue with current flushing regimen. He says he will do this.  Regarding his dysphagia. RD reviewed it is a fine line regarding when he should give up po intake due to safety. Confirmed it will be beneficial to continue to utilize swallow fx, but he should not do this if he is unsafe and RD does not have the scope of practice to dictate when this is. RD noted on his appt list that he cannot f/u with SLP due to lack of funds. RD called and spoke with SLP who graciously stated she would see patient briefly and try to give advice and potentially fix his mouth expander as this is crucial for avoiding/mitigating radiation fibrosis.   RD provided much supportive listening. He has no family/social support and also has significant financial hardship. He notes he greatly appreciates being seen during his infusions due to lack of gas money. RD noted he has no appt next Tuesday. RD will place pt on RD schedule for Friday when he has an infusion scheduled.   Though his weight is stable. Still believe he needs to increase TF. Goal to meet 100% of needs with PEG would be:   Osmolite 1.5: 1.5 cans QID. w 60 cc flush before and after (provides extra 480 cc free water). +Additional 240cc free water bolus TID.   Though if he continues drinking 2-3 ensures/day, may be able to reduce this. Will Follow weight closely.   Addendum-RD noted  Alternate APCC RD off this week. This RD will see patient on Friday as he does not have appt on Tues next week.   GOAL:  Patient will meet greater than or equal to 90% of their needs  MONITOR:  Labs, Weight trends, TF tolerance, I & O's, PO intake  Next Visit: Friday 8/23  [name] RD, LDN, CNSC Clinical Nutrition Available Tues-Sat via Pager: [phone] 08/10/2018 2:06 PM

## 2018-08-10 NOTE — Progress Notes (Signed)
[place] reviewed with Dr. Delton Coombes and pt approved for chemo tx today per MD                             [name] tolerated chemo tx well without complaints or incident. Pt discharged with 5FU pump infusing without issues. VSS upon discharge. Pt discharged self ambulatory in satisfactory condition

## 2018-08-10 NOTE — Patient Instructions (Signed)
[facility] Discharge Instructions for Patients Receiving Chemotherapy   Beginning January 23rd 2017 lab work for the [facility] will be done in the  Main lab at [facility] on 1st floor. If you have a lab appointment with the [place] please come in thru the  Main Entrance and check in at the main information desk   Today you received the following chemotherapy agents Carboplatin and 5FU. Follow-up as scheduled. Call clinic for any questions or concerns  To help prevent nausea and vomiting after your treatment, we encourage you to take your nausea medication    If you develop nausea and vomiting, or diarrhea that is not controlled by your medication, call the clinic.  The clinic phone number is [phone]. Office hours are Monday-Friday 8:30am-5:00pm.  BELOW ARE SYMPTOMS THAT SHOULD BE REPORTED IMMEDIATELY:  *FEVER GREATER THAN 101.0 F  *CHILLS WITH OR WITHOUT FEVER  NAUSEA AND VOMITING THAT IS NOT CONTROLLED WITH YOUR NAUSEA MEDICATION  *UNUSUAL SHORTNESS OF BREATH  *UNUSUAL BRUISING OR BLEEDING  TENDERNESS IN MOUTH AND THROAT WITH OR WITHOUT PRESENCE OF ULCERS  *URINARY PROBLEMS  *BOWEL PROBLEMS  UNUSUAL RASH Items with * indicate a potential emergency and should be followed up as soon as possible. If you have an emergency after office hours please contact your primary care physician or go to the nearest emergency department.  Please call the clinic during office hours if you have any questions or concerns.   You may also contact the Patient Navigator at [phone] should you have any questions or need assistance in obtaining follow up care.      Resources For Cancer Patients and their Caregivers ? American Cancer Society: Can assist with transportation, wigs, general needs, runs Look Good Feel Better.        815-587-6357 ? Cancer Care: Provides financial assistance, online support groups, medication/co-pay assistance.   1-800-813-HOPE 413-398-1221) ? [facility] Assists [place] cancer patients and their families through emotional , educational and financial support.  ([phone] ? [place] DSS Where to apply for food stamps, Medicaid and utility assistance. (412)[phone] ? RCATS: Transportation to medical appointments. 310-580-0899 ? Social Security Administration: May apply for disability if have a Stage IV cancer. 938-619-9794 ([phone] ? [employer], Disability and Transit Services: Assists with nutrition, care and transit needs. [phone]

## 2018-08-11 ENCOUNTER — Encounter (HOSPITAL_COMMUNITY): Payer: Self-pay

## 2018-08-11 ENCOUNTER — Inpatient Hospital Stay (HOSPITAL_COMMUNITY): Payer: Medicare Other

## 2018-08-11 ENCOUNTER — Ambulatory Visit (HOSPITAL_COMMUNITY): Payer: Self-pay

## 2018-08-11 ENCOUNTER — Ambulatory Visit (HOSPITAL_COMMUNITY): Payer: Self-pay | Admitting: Hematology

## 2018-08-11 ENCOUNTER — Other Ambulatory Visit (HOSPITAL_COMMUNITY): Payer: Self-pay

## 2018-08-11 VITALS — BP 117/55 | HR 59 | Temp 97.3°F | Resp 18 | Wt 159.0 lb

## 2018-08-11 DIAGNOSIS — C099 Malignant neoplasm of tonsil, unspecified: Secondary | ICD-10-CM | POA: Diagnosis not present

## 2018-08-11 MED ORDER — SODIUM CHLORIDE 0.9 % IV SOLN
70.0000 mg/m2 | Freq: Once | INTRAVENOUS | Status: AC
  Administered 2018-08-11: 140 mg via INTRAVENOUS
  Filled 2018-08-11: qty 14

## 2018-08-11 MED ORDER — SODIUM CHLORIDE 0.9 % IV SOLN
INTRAVENOUS | Status: DC
  Administered 2018-08-11: 11:00:00 via INTRAVENOUS

## 2018-08-11 MED ORDER — DEXAMETHASONE SODIUM PHOSPHATE 10 MG/ML IJ SOLN
10.0000 mg | Freq: Once | INTRAMUSCULAR | Status: AC
  Administered 2018-08-11: 10 mg via INTRAVENOUS
  Filled 2018-08-11: qty 1

## 2018-08-11 NOTE — Patient Instructions (Signed)
[facility] Discharge Instructions for Patients Receiving Chemotherapy  Today you received the following chemotherapy agents carboplatin.    If you develop nausea and vomiting that is not controlled by your nausea medication, call the clinic.   BELOW ARE SYMPTOMS THAT SHOULD BE REPORTED IMMEDIATELY:  *FEVER GREATER THAN 100.5 F  *CHILLS WITH OR WITHOUT FEVER  NAUSEA AND VOMITING THAT IS NOT CONTROLLED WITH YOUR NAUSEA MEDICATION  *UNUSUAL SHORTNESS OF BREATH  *UNUSUAL BRUISING OR BLEEDING  TENDERNESS IN MOUTH AND THROAT WITH OR WITHOUT PRESENCE OF ULCERS  *URINARY PROBLEMS  *BOWEL PROBLEMS  UNUSUAL RASH Items with * indicate a potential emergency and should be followed up as soon as possible.  Feel free to call the clinic should you have any questions or concerns. The clinic phone number is [phone].  Please show the [place] at check-in to the Emergency Department and triage nurse.

## 2018-08-11 NOTE — Progress Notes (Signed)
Patient tolerated chemotherapy with no complaints voiced.  Port site clean and dry with dressing reinforced. No bruising or swelling noted at site.  Good blood return noted before and after administration of therapy.  Chemotherapy pump infusing with no alarms noted.  VSS with discharge and left ambulatory with no s/s of distress noted.

## 2018-08-12 ENCOUNTER — Encounter (HOSPITAL_COMMUNITY): Payer: Self-pay

## 2018-08-12 ENCOUNTER — Inpatient Hospital Stay (HOSPITAL_COMMUNITY): Payer: Medicare Other

## 2018-08-12 VITALS — BP 111/57 | HR 54 | Temp 98.5°F | Resp 18 | Wt 159.6 lb

## 2018-08-12 DIAGNOSIS — C099 Malignant neoplasm of tonsil, unspecified: Secondary | ICD-10-CM | POA: Diagnosis not present

## 2018-08-12 MED ORDER — SODIUM CHLORIDE 0.9 % IV SOLN
INTRAVENOUS | Status: DC
  Administered 2018-08-12: 11:00:00 via INTRAVENOUS

## 2018-08-12 MED ORDER — DEXAMETHASONE SODIUM PHOSPHATE 10 MG/ML IJ SOLN
10.0000 mg | Freq: Once | INTRAMUSCULAR | Status: AC
  Administered 2018-08-12: 10 mg via INTRAVENOUS
  Filled 2018-08-12: qty 1

## 2018-08-12 MED ORDER — SODIUM CHLORIDE 0.9 % IV SOLN
70.0000 mg/m2 | Freq: Once | INTRAVENOUS | Status: AC
  Administered 2018-08-12: 140 mg via INTRAVENOUS
  Filled 2018-08-12: qty 14

## 2018-08-12 MED ORDER — SODIUM CHLORIDE 0.9% FLUSH
10.0000 mL | INTRAVENOUS | Status: DC | PRN
  Administered 2018-08-12: 10 mL
  Filled 2018-08-12: qty 10

## 2018-08-12 NOTE — Progress Notes (Signed)
CEPHUS TUPY tolerated Carboplatin infusion well without complaints or incident. 5FU pump continues to infuse without issues. VSS upon discharge. Pt discharged self ambulatory in satisfactory condition

## 2018-08-12 NOTE — Patient Instructions (Addendum)
[facility] Discharge Instructions for Patients Receiving Chemotherapy   Beginning January 23rd 2017 lab work for the Cancer Center will be done in the  Main lab at [place] on 1st floor. If you have a lab appointment with the Cancer Center please come in thru the  Main Entrance and check in at the main information desk   Today you received the following chemotherapy agents Carboplatin. Follow-up as scheduled. Call clinic for any questions or concerns  To help prevent nausea and vomiting after your treatment, we encourage you to take your nausea medication   If you develop nausea and vomiting, or diarrhea that is not controlled by your medication, call the clinic.  The clinic phone number is [phone]. Office hours are Monday-Friday 8:30am-5:00pm.  BELOW ARE SYMPTOMS THAT SHOULD BE REPORTED IMMEDIATELY:  *FEVER GREATER THAN 101.0 F  *CHILLS WITH OR WITHOUT FEVER  NAUSEA AND VOMITING THAT IS NOT CONTROLLED WITH YOUR NAUSEA MEDICATION  *UNUSUAL SHORTNESS OF BREATH  *UNUSUAL BRUISING OR BLEEDING  TENDERNESS IN MOUTH AND THROAT WITH OR WITHOUT PRESENCE OF ULCERS  *URINARY PROBLEMS  *BOWEL PROBLEMS  UNUSUAL RASH Items with * indicate a potential emergency and should be followed up as soon as possible. If you have an emergency after office hours please contact your primary care physician or go to the nearest emergency department.  Please call the clinic during office hours if you have any questions or concerns.   You may also contact the Patient Navigator at [phone] should you have any questions or need assistance in obtaining follow up care.      Resources For Cancer Patients and their Caregivers ? American Cancer Society: Can assist with transportation, wigs, general needs, runs Look Good Feel Better.        1-888-227-6333 ? Cancer Care: Provides financial assistance, online support groups, medication/co-pay assistance.  1-800-813-HOPE  (4673) ? [facility] Assists [place] cancer patients and their families through emotional , educational and financial support.  336-427-4357 ? [place] DSS Where to apply for food stamps, Medicaid and utility assistance. 336-342-1394 ? RCATS: Transportation to medical appointments. 336-347-2287 ? Social Security Administration: May apply for disability if have a Stage IV cancer. 336-342-7796 1-800-772-1213 ? [place] Aging, Disability and Transit Services: Assists with nutrition, care and transit needs. [phone]         

## 2018-08-13 ENCOUNTER — Encounter (HOSPITAL_COMMUNITY): Payer: Self-pay

## 2018-08-13 ENCOUNTER — Inpatient Hospital Stay (HOSPITAL_COMMUNITY): Payer: Medicare Other

## 2018-08-13 ENCOUNTER — Encounter (HOSPITAL_COMMUNITY): Payer: Self-pay | Admitting: Dietician

## 2018-08-13 VITALS — BP 114/58 | HR 58 | Temp 97.8°F | Resp 18 | Wt 161.6 lb

## 2018-08-13 DIAGNOSIS — C099 Malignant neoplasm of tonsil, unspecified: Secondary | ICD-10-CM | POA: Diagnosis not present

## 2018-08-13 MED ORDER — SODIUM CHLORIDE 0.9 % IV SOLN
INTRAVENOUS | Status: DC
  Administered 2018-08-13: 11:00:00 via INTRAVENOUS

## 2018-08-13 MED ORDER — DEXAMETHASONE SODIUM PHOSPHATE 10 MG/ML IJ SOLN: 10.0000 mg | Freq: Once | INTRAMUSCULAR | Status: DC

## 2018-08-13 MED ORDER — SODIUM CHLORIDE 0.9 % IV SOLN
70.0000 mg/m2 | Freq: Once | INTRAVENOUS | Status: AC
  Administered 2018-08-13: 140 mg via INTRAVENOUS
  Filled 2018-08-13: qty 14

## 2018-08-13 MED ORDER — SODIUM CHLORIDE 0.9 % IV SOLN
10.0000 mg | Freq: Once | INTRAVENOUS | Status: AC
  Administered 2018-08-13: 10 mg via INTRAVENOUS
  Filled 2018-08-13: qty 1

## 2018-08-13 MED ORDER — SODIUM CHLORIDE 0.9% FLUSH
10.0000 mL | INTRAVENOUS | Status: DC | PRN
  Administered 2018-08-13: 10 mL
  Filled 2018-08-13: qty 10

## 2018-08-13 NOTE — Patient Instructions (Signed)
[facility] Discharge Instructions for Patients Receiving Chemotherapy   Beginning January 23rd 2017 lab work for the Cancer Center will be done in the  Main lab at  on 1st floor. If you have a lab appointment with the Cancer Center please come in thru the  Main Entrance and check in at the main information desk   Today you received the following chemotherapy agents Carboplatin. Follow-up as scheduled. Call clinic for any questions or concerns  To help prevent nausea and vomiting after your treatment, we encourage you to take your nausea medication   If you develop nausea and vomiting, or diarrhea that is not controlled by your medication, call the clinic.  The clinic phone number is [phone]. Office hours are Monday-Friday 8:30am-5:00pm.  BELOW ARE SYMPTOMS THAT SHOULD BE REPORTED IMMEDIATELY:  *FEVER GREATER THAN 101.0 F  *CHILLS WITH OR WITHOUT FEVER  NAUSEA AND VOMITING THAT IS NOT CONTROLLED WITH YOUR NAUSEA MEDICATION  *UNUSUAL SHORTNESS OF BREATH  *UNUSUAL BRUISING OR BLEEDING  TENDERNESS IN MOUTH AND THROAT WITH OR WITHOUT PRESENCE OF ULCERS  *URINARY PROBLEMS  *BOWEL PROBLEMS  UNUSUAL RASH Items with * indicate a potential emergency and should be followed up as soon as possible. If you have an emergency after office hours please contact your primary care physician or go to the nearest emergency department.  Please call the clinic during office hours if you have any questions or concerns.   You may also contact the Patient Navigator at [phone] should you have any questions or need assistance in obtaining follow up care.      Resources For Cancer Patients and their Caregivers ? American Cancer Society: Can assist with transportation, wigs, general needs, runs Look Good Feel Better.        1-888-227-6333 ? Cancer Care: Provides financial assistance, online support groups, medication/co-pay assistance.  1-800-813-HOPE  (4673) ? [facility] Assists [place] cancer patients and their families through emotional , educational and financial support.  336-427-4357 ? [place] DSS Where to apply for food stamps, Medicaid and utility assistance. 336-342-1394 ? RCATS: Transportation to medical appointments. 336-347-2287 ? Social Security Administration: May apply for disability if have a Stage IV cancer. 336-342-7796 1-800-772-1213 ? [place] Aging, Disability and Transit Services: Assists with nutrition, care and transit needs. [phone]         

## 2018-08-13 NOTE — Progress Notes (Signed)
Nutrition Follow-up 67 y/o male PMHx HLD, HOH, CKD, w/ little recent medical follow up. Presented to [facility] 6/12 after referred by local ENT MD for large L tonsillar mass s/p biopsy. + for SCC. At time of presentation, had dysphagia and 5 lb loss x1 month. Begun radiation 7/22 and chemo 7/23. Developed severe mucositis, dysphagia and odynophagia losing ~16 lbs since starting therapy. S/P peg placement 8/14 and start of Tube feeding 8/16.   Pt last seen Tuesday, at which time he had been asked to increase his tube feedings from 1/2 can QID to 1/2 can BID + 1 can BID and to continue flushing with 60 cc before/after and include a 240 cc free water fluid bolus TID.   Today, patient reports he had successfully increased his tube feeding. He had no trouble whatsoever tolerating the change. Still no n/v/c/d. His last BM was this morning. No distension.   PO intake wise, he says he is drinking 2-3 Ensure/day. He occasionally eats icecream, but says he really has no desire as every tastes bad. He says his pain is more focused on his palate, but he is able to manage this well with his carafate. He says he does not have the funds to purchase the liquid morphine at this time. Notes he was unable to see SLP earlier this week. He continues to do oral exercises as able.   Wt wise, he is up 4 lbs since RDs last encounter. He denies having any swelling. He reports a SIGNIFICANT increase in his energy/functional status since he started his tube feeding. He says before he started his TF, he couldn't walk up a single flight of stairs without experiencing fatigue/sob. Now, he says he can go up the two flights of stairs to his apartment without issue.   He again notes financial strain. He says he had to do much unanticipated driving recently, which was costly. RD noted he would speak with financial counselor.   Wt Readings from Last 10 Encounters:  08/13/18 161 lb 9.6 oz (73.3 kg)  08/12/18 159 lb 9.6 oz (72.4 kg)  08/11/18  159 lb (72.1 kg)  08/09/18 157 lb 8 oz (71.4 kg)  08/05/18 156 lb 6.4 oz (70.9 kg)  08/03/18 157 lb 9.6 oz (71.5 kg)  08/03/18 158 lb (71.7 kg)  08/02/18 159 lb 12.8 oz (72.5 kg)  07/19/18 174 lb (78.9 kg)  07/15/18 175 lb 12.8 oz (79.7 kg)   MEDICATIONS: Chemo: 5FU, Carboplatin Other Meds: Liquid morphine, Percocet, Carafate, Compazine  LABS:  No new lab works since Tuesday: K: 3.3, Albumin:3.4, Phos:2.9, Mag 2.1 Creat: 1.02-stable x1 wk.   Recent Labs  Lab 08/09/18 1056 08/10/18 1037  NA 141 138  K 3.3* 3.3*  CL 98 99  CO2 32 33*  BUN 17 15  CREATININE 0.99 1.02  CALCIUM 9.3 9.1  MG 2.1  --   PHOS 2.9  --   GLUCOSE 135* 127*   ANTHROPOMETRICS: Height:  Ht Readings from Last 1 Encounters:  08/05/18 '5\' 8"'  (1.727 m)   Weight:  Wt Readings from Last 1 Encounters:  08/13/18 161 lb 9.6 oz (73.3 kg)   BMI:  BMI Readings from Last 1 Encounters:  08/13/18 24.57 kg/m   UBW: 182 when presenting In June IBW: 70 kg Wt change: +4 lbs since Tuesday.  Re-Estimated needs:  Energy: 2050-2200 kcals (30-32 kcal/kg bw) Protein: 95-110g Pro (1.3-1.5 g/kg bw) Fluid: 2.-2.2  L fluid (1 ml/kcal)  NUTRITION DIAGNOSIS:  Inadequate oral intake related to  side effects of chemoradiation as patient report of minimal PO intake and need for PEG   DOCUMENTATION CODES:  Severe malnutrition->resolved with tube feeding.   INTERVENTION:  His current TF of 3 cans Osmolite 1.5/d is providing 1065 kcals, 45g Pro, 543 ml fluid. He appears to actually be meeting his needs with only these 3 cans, as he has gained 4 lbs since last seen. Does not appear to have or report any swelling.   He is also drinking 2-3 Ensure/day and intermittently eating icecream. The supplements alone would provide him [phone] kcals and 40-60g pro.   Assuming he drinks 3 Ensure/day, his Current TF+oral supplement intake is providing: ~2115 kcals, 105g Pro, 1083 ml fluid + extra 480 cc free water from flushes and  Additional 240cc free water bolus TID.   He appears to have lower energy requirements then initially estimated as he is gaining weight w/ 3 cans. Will hold off increasing TF for now.  Patient does not have any upcoming infusions this next week. RD noted he would call the patient the next week as a brief check in. Unfortunately, he says he does not own a scale to weigh himself and make sure he continues meeting his kcal needs, "I go by the notches on my belt"  RD spoke with financial counselor regarding patients noted lack of gas funds. Unfortunately, he is not eligible for more assistance at this time as he just received a fuel card. Financial counselor recommended finishing [employer] application as they will provide immediate financial assistance.  GOAL:  Patient will meet greater than or equal to 90% of their needs  MET  MONITOR:  Labs, Weight trends, TF tolerance, I & O's, PO intake  Next Visit: Remote follow up next week.   [name] RD, LDN, CNSC Clinical Nutrition Available Tues-Sat via Pager: [phone] 08/13/2018 10:36 AM

## 2018-08-13 NOTE — Progress Notes (Signed)
JOESEPH VERVILLE tolerated chemo tx well without complaints or incident. VSS upon discharge. Pt discharged with 5FU pump infusing without issues. Pt discharged self ambulatory in satisfactory condition

## 2018-08-14 ENCOUNTER — Encounter (HOSPITAL_COMMUNITY): Payer: Self-pay

## 2018-08-14 ENCOUNTER — Inpatient Hospital Stay (HOSPITAL_COMMUNITY): Payer: Medicare Other

## 2018-08-14 VITALS — BP 124/55 | HR 61 | Temp 98.1°F | Resp 18

## 2018-08-14 DIAGNOSIS — C099 Malignant neoplasm of tonsil, unspecified: Secondary | ICD-10-CM | POA: Diagnosis not present

## 2018-08-14 MED ORDER — SODIUM CHLORIDE 0.9% FLUSH
10.0000 mL | INTRAVENOUS | Status: DC | PRN
  Administered 2018-08-14: 10 mL
  Filled 2018-08-14: qty 10

## 2018-08-14 MED ORDER — LIDOCAINE VISCOUS HCL 2 % MT SOLN: OROMUCOSAL | 0 refills | Status: DC

## 2018-08-14 MED ORDER — HEPARIN SOD (PORK) LOCK FLUSH 100 UNIT/ML IV SOLN
500.0000 [IU] | Freq: Once | INTRAVENOUS | Status: AC | PRN
  Administered 2018-08-14: 500 [IU]

## 2018-08-14 NOTE — Patient Instructions (Signed)
[facility] Discharge Instructions  5FU pump discontinued with portacath flushed per protocol. Follow-up as scheduled. Call clinic for any questions or concerns   Thank you for choosing  Cancer Center at [facility] to provide your oncology and hematology care.  To afford each patient quality time with our provider, please arrive at least 15 minutes before your scheduled appointment time.   If you have a lab appointment with the Cancer Center please come in thru the  Main Entrance and check in at the main information desk  You need to re-schedule your appointment should you arrive 10 or more minutes late.  We strive to give you quality time with our providers, and arriving late affects you and other patients whose appointments are after yours.  Also, if you no show three or more times for appointments you may be dismissed from the clinic at the providers discretion.     Again, thank you for choosing [facility].  Our hope is that these requests will decrease the amount of time that you wait before being seen by our physicians.       _____________________________________________________________  Should you have questions after your visit to [facility], please contact our office at [phone] between the hours of 8:00 a.m. and 4:30 p.m.  Voicemails left after 4:00 p.m. will not be returned until the following business day.  For prescription refill requests, have your pharmacy contact our office and allow 72 hours.    Cancer Center Support Programs:   > Cancer Support Group  2nd Tuesday of the month 1pm-2pm, Journey Room   

## 2018-08-14 NOTE — Progress Notes (Signed)
[name] tolerated 5FU pump well without complaints or incident. 5FU pump discontinued with portacath flushed with 10 ml NS and 5 ml Heparin easily per protocol then de-accessed. Pt reported he feels like there is a blister on the inside of his mouth on the left side which just developed today. He is unable to open his mouth wide enough to visualize the area. He has been gargling with salt water and baking soda. Viscous lidocaine with Maalox mixture e-scribed to pt's pharmacy and pt instructed to try it four times a day and call us Monday if this doesn't help. Pt verbalized understanding and has an appt with the MD this Wednesday. VSS Pt discharged self ambulatory in satisfactory condition

## 2018-08-17 ENCOUNTER — Encounter (HOSPITAL_COMMUNITY): Payer: Self-pay | Admitting: General Practice

## 2018-08-17 NOTE — Progress Notes (Signed)
[place] CSW Progress Notes  Call to [facility], patient was referred to [facility] on Aug 13.  Per [facility], they spoke w patient on Aug 14, pt stated he was awaiting surgery and would call program back for help.  Per [facility], patient has not done so and they will not reach out again because they "spoke to the patient and he said he would call."  Patient also referred to [facility] - patient has not completed their application for financial assistance.  Patient called, left VM w above information.  Encouraged patient to reach out for help w completing applications/paperwork if needed.    Edwyna Shell, LCSW Clinical Social Worker Phone:  [phone]

## 2018-08-18 ENCOUNTER — Inpatient Hospital Stay (HOSPITAL_COMMUNITY): Payer: Medicare Other

## 2018-08-18 ENCOUNTER — Other Ambulatory Visit: Payer: Self-pay

## 2018-08-18 ENCOUNTER — Ambulatory Visit (HOSPITAL_COMMUNITY): Payer: Medicare Other | Attending: Hematology | Admitting: Speech Pathology

## 2018-08-18 ENCOUNTER — Encounter (HOSPITAL_COMMUNITY): Payer: Self-pay | Admitting: Hematology

## 2018-08-18 ENCOUNTER — Inpatient Hospital Stay (HOSPITAL_BASED_OUTPATIENT_CLINIC_OR_DEPARTMENT_OTHER): Payer: Medicare Other | Admitting: Hematology

## 2018-08-18 DIAGNOSIS — Z5111 Encounter for antineoplastic chemotherapy: Secondary | ICD-10-CM

## 2018-08-18 DIAGNOSIS — R634 Abnormal weight loss: Secondary | ICD-10-CM

## 2018-08-18 DIAGNOSIS — E785 Hyperlipidemia, unspecified: Secondary | ICD-10-CM

## 2018-08-18 DIAGNOSIS — C099 Malignant neoplasm of tonsil, unspecified: Secondary | ICD-10-CM

## 2018-08-18 DIAGNOSIS — R5383 Other fatigue: Secondary | ICD-10-CM

## 2018-08-18 DIAGNOSIS — Z7689 Persons encountering health services in other specified circumstances: Secondary | ICD-10-CM | POA: Diagnosis not present

## 2018-08-18 DIAGNOSIS — K1379 Other lesions of oral mucosa: Secondary | ICD-10-CM | POA: Diagnosis not present

## 2018-08-18 DIAGNOSIS — Z79899 Other long term (current) drug therapy: Secondary | ICD-10-CM

## 2018-08-18 DIAGNOSIS — N189 Chronic kidney disease, unspecified: Secondary | ICD-10-CM

## 2018-08-18 DIAGNOSIS — Z87891 Personal history of nicotine dependence: Secondary | ICD-10-CM

## 2018-08-18 DIAGNOSIS — R630 Anorexia: Secondary | ICD-10-CM

## 2018-08-18 DIAGNOSIS — I129 Hypertensive chronic kidney disease with stage 1 through stage 4 chronic kidney disease, or unspecified chronic kidney disease: Secondary | ICD-10-CM

## 2018-08-18 LAB — CBC WITH DIFFERENTIAL/PLATELET
BASOS ABS: 0 10*3/uL (ref 0.0–0.1)
Basophils Relative: 0 %
EOS PCT: 4 %
Eosinophils Absolute: 0 10*3/uL (ref 0.0–0.7)
HCT: 34.5 % — ABNORMAL LOW (ref 39.0–52.0)
HEMOGLOBIN: 11.4 g/dL — AB (ref 13.0–17.0)
LYMPHS ABS: 0.1 10*3/uL — AB (ref 0.7–4.0)
LYMPHS PCT: 9 %
MCH: 30.5 pg (ref 26.0–34.0)
MCHC: 33 g/dL (ref 30.0–36.0)
MCV: 92.2 fL (ref 78.0–100.0)
Monocytes Absolute: 0.1 10*3/uL (ref 0.1–1.0)
Monocytes Relative: 5 %
NEUTROS ABS: 0.9 10*3/uL — AB (ref 1.7–7.7)
Neutrophils Relative %: 82 %
PLATELETS: 144 10*3/uL — AB (ref 150–400)
RBC: 3.74 MIL/uL — AB (ref 4.22–5.81)
RDW: 13.9 % (ref 11.5–15.5)
WBC: 1.1 10*3/uL — AB (ref 4.0–10.5)

## 2018-08-18 LAB — COMPREHENSIVE METABOLIC PANEL
ALK PHOS: 57 U/L (ref 38–126)
ALT: 32 U/L (ref 0–44)
AST: 19 U/L (ref 15–41)
Albumin: 3.4 g/dL — ABNORMAL LOW (ref 3.5–5.0)
Anion gap: 7 (ref 5–15)
BUN: 13 mg/dL (ref 8–23)
CALCIUM: 8.4 mg/dL — AB (ref 8.9–10.3)
CHLORIDE: 96 mmol/L — AB (ref 98–111)
CO2: 30 mmol/L (ref 22–32)
CREATININE: 0.8 mg/dL (ref 0.61–1.24)
GFR calc Af Amer: >60 mL/min (ref >60)
Glucose, Bld: 129 mg/dL — ABNORMAL HIGH (ref 70–99)
Potassium: 4 mmol/L (ref 3.5–5.1)
Sodium: 133 mmol/L — ABNORMAL LOW (ref 135–145)
TOTAL PROTEIN: 6.8 g/dL (ref 6.5–8.1)
Total Bilirubin: 1.1 mg/dL (ref 0.3–1.2)

## 2018-08-18 NOTE — Progress Notes (Signed)
CRITICAL VALUE STICKER  CRITICAL VALUE: WBC 1.1  RECEIVER: Jene Every, RN  DATE & TIME NOTIFIED: 08/18/18 1204  MD NOTIFIED: Dr. Delton Coombes  No orders at this time, provider to see patient in office.

## 2018-08-18 NOTE — Progress Notes (Signed)
[name], [place] [address]   CLINIC:  Medical Oncology/Hematology  PCP:  [name] Norlander, DO [place] [id number] [phone]   REASON FOR VISIT:  Follow-up for malignant neoplasm of the tonsil  CURRENT THERAPY: carboplatin and 5FU  BRIEF ONCOLOGIC HISTORY:    Malignant neoplasm of tonsil ([place])   06/02/2018 Initial Diagnosis    Tonsillar cancer ([place])    06/15/2018 -  Chemotherapy    The patient had palonosetron (ALOXI) injection 0.25 mg, 0.25 mg, Intravenous,  Once, 2 of 3 cycles Administration: 0.25 mg (07/13/2018), 0.25 mg (07/15/2018), 0.25 mg (08/10/2018) CARBOplatin (PARAPLATIN) 140 mg in sodium chloride 0.9 % 100 mL chemo infusion, 70 mg/m2 = 140 mg (100 % of original dose 70 mg/m2), Intravenous,  Once, 2 of 3 cycles Dose modification: 70 mg/m2 (original dose 70 mg/m2, Cycle 1, Reason: Other (see comments), Comment: french protocol head and neck) Administration: 140 mg (07/13/2018), 140 mg (07/14/2018), 140 mg (07/15/2018), 140 mg (07/19/2018), 140 mg (08/10/2018), 140 mg (08/11/2018), 140 mg (08/12/2018), 140 mg (08/13/2018) fluorouracil (ADRUCIL) 4,750 mg in sodium chloride 0.9 % 55 mL chemo infusion, 600 mg/m2/day = 4,750 mg (100 % of original dose 600 mg/m2/day), Intravenous, 4D (96 hours ), 2 of 3 cycles Dose modification: 600 mg/m2/day (original dose 600 mg/m2/day, Cycle 1, Reason: Other (see comments), Comment: french protocol head and neck) Administration: 4,750 mg (07/13/2018), 4,750 mg (08/10/2018)  for chemotherapy treatment.       CANCER STAGING: Cancer Staging Malignant neoplasm of tonsil ([place]) Staging form: Pharynx - HPV-Mediated Oropharynx, AJCC 8th Edition - Clinical stage from 06/02/2018: Stage II (cT3, cN2, cM0) - Unsigned    INTERVAL HISTORY:  [name] 67 y.o. male returns for routine follow-up for malignant neoplasm of the tonsil. Patient is here today after radiation. His neck is red and painful and  he has several mouth sores. Patient has lost 5 pounds. He is taking 3 cans of feeding daily. He was orginally told to take 4-5 cans and he misunderstood. He was given more cans and understands the amount now. He is unable to take any liquids oral due the the painful sores. His appetite is 25%. His energy level is 50%. He is trying to remain active.    REVIEW OF SYSTEMS:  Review of Systems  Constitutional: Positive for fatigue.  HENT:   Positive for mouth sores and trouble swallowing.   All other systems reviewed and are negative.    PAST MEDICAL/SURGICAL HISTORY:  Past Medical History:  Diagnosis Date  . HOH (hard of hearing)   . Hyperlipidemia   . Mass of oral cavity 05/12/2018  . Positive FIT (fecal immunochemical test) 05/12/2018  . PTSD (post-traumatic stress disorder)    has not been offically diagnosised  . Tonsillar cancer ([place]) 06/02/2018   Past Surgical History:  Procedure Laterality Date  . APPENDECTOMY  1969  . COLONOSCOPY W/ POLYPECTOMY     remote past  . ESOPHAGOGASTRODUODENOSCOPY  08/04/2018   Dr. Arnoldo Morale: erythema of larynx, normal stomach, normal duodenum. PEG Placed 20 F, bumper at 2.5 cm  . ESOPHAGOGASTRODUODENOSCOPY (EGD) WITH PROPOFOL N/A 08/04/2018   Procedure: ESOPHAGOGASTRODUODENOSCOPY (EGD) WITH PROPOFOL;  Surgeon: Aviva Signs, MD;  [facility];  Service: Gastroenterology;  Laterality: N/A;  . FINGER FRACTURE SURGERY Left   . MULTIPLE EXTRACTIONS WITH ALVEOLOPLASTY N/A 06/15/2018   Procedure: Extraction of tooth #'s 2,12,13,18,20,and 21 with alveoloplasty and gross debridement of remaining teeth;  Surgeon:  Lenn Cal, DDS;  Location: [facility];  Service: Oral Surgery;  Laterality: N/A;  . PEG PLACEMENT N/A 08/04/2018   Procedure: PERCUTANEOUS ENDOSCOPIC GASTROSTOMY (PEG) PLACEMENT;  Surgeon: Aviva Signs, MD;  [facility];  Service: Gastroenterology;  Laterality: N/A;  . PORTACATH PLACEMENT Left 07/02/2018   Procedure: INSERTION  PORT-A-CATH;  Surgeon: Aviva Signs, MD;  Location: [facility];  Service: General;  Laterality: Left;     SOCIAL HISTORY:  Social History   Socioeconomic History  . Marital status: Divorced    Spouse name: Not on file  . Number of children: Not on file  . Years of education: Not on file  . Highest education level: Not on file  Occupational History  . Occupation: English as a second language teacher, retired  Scientific laboratory technician  . Financial resource strain: Not on file  . Food insecurity:    Worry: Not on file    Inability: Not on file  . Transportation needs:    Medical: Not on file    Non-medical: Not on file  Tobacco Use  . Smoking status: Former Smoker    Years: 15.00    Types: Cigarettes    Last attempt to quit: 2000    Years since quitting: 19.6  . Smokeless tobacco: Never Used  Substance and Sexual Activity  . Alcohol use: Yes    Alcohol/week: 12.0 standard drinks    Types: 12 Cans of beer per week  . Drug use: Never  . Sexual activity: Not Currently  Lifestyle  . Physical activity:    Days per week: Not on file    Minutes per session: Not on file  . Stress: Not on file  Relationships  . Social connections:    Talks on phone: Not on file    Gets together: Not on file    Attends religious service: Not on file    Active member of club or organization: Not on file    Attends meetings of clubs or organizations: Not on file    Relationship status: Not on file  . Intimate partner violence:    Fear of current or ex partner: Not on file    Emotionally abused: Not on file    Physically abused: Not on file    Forced sexual activity: Not on file  Other Topics Concern  . 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  He lives independently with his dog here in [place].  He formally lived in [place].    FAMILY HISTORY:  Family History  Problem Relation Age of Onset  . Diabetes Father        died at age 65   . Colon cancer Neg Hx      CURRENT MEDICATIONS:  Outpatient Encounter Medications as of 08/18/2018  Medication Sig  . fluconazole (DIFLUCAN) 10 MG/ML suspension 10 mL (100 mg total) by G-tube route daily. for 15 doses 20 ML PO or via PEG Today, then 10 ML QD x 13 days  . ibuprofen (ADVIL,MOTRIN) 200 MG tablet Take 400 mg by mouth every 6 (six) hours as needed for moderate pain.   Marland Kitchen lidocaine (XYLOCAINE) 2 % solution Viscous lidocaine 2%/ Maalox 1:1 mixture. Swish and swallow 1 tablespoon four times a day100  . lidocaine-prilocaine (EMLA) cream Apply to affected area once  . Morphine Sulfate (MORPHINE CONCENTRATE) 10 mg / 0.5 ml concentrated solution Take 0.5 mLs (10 mg total) by mouth every 3 (three) hours as  needed for severe pain.  Marland Kitchen oxyCODONE-acetaminophen (PERCOCET) 5-325 MG tablet Take one tablet by mouth every 4 hours as needed for pain.  Marland Kitchen prochlorperazine (COMPAZINE) 10 MG tablet Take 1 tablet (10 mg total) by mouth every 6 (six) hours as needed (Nausea or vomiting).  . sodium fluoride (FLUORISHIELD) 1.1 % GEL dental gel Instill one drop of gel per tooth space of fluoride tray. Place over teeth for 5 minutes. Remove. Spit out excess. Repeat nightly.  . sucralfate (CARAFATE) 1 GM/10ML suspension Carafate 1gm/36ml & Viscous lidocaine 2% 1:1 mixture.  Swish and swallow 1 tablespoon four times a day.   No facility-administered encounter medications on file as of 08/18/2018.     ALLERGIES:  No Known Allergies   PHYSICAL EXAM:  ECOG Performance status: 1  Vitals:   08/18/18 1207  BP: 129/63  Pulse: 80  Resp: 16  SpO2: 100%   Filed Weights   08/18/18 1207  Weight: 157 lb 4.8 oz (71.4 kg)    Physical Exam  Constitutional: He is oriented to person, place, and time.  Cardiovascular: Normal rate, regular rhythm and normal heart sounds.  Pulmonary/Chest: Effort normal and breath sounds normal.  Neurological: He is alert and oriented to person, place, and time.  Skin: Skin is warm and dry.  HEENT:  Severe mucositis on the lower lips as well as tongue. Skin over the neck region is very erythematous from radiation-induced skin breakdown.   LABORATORY DATA:  I have reviewed the labs as listed.  CBC    Component Value Date/Time   WBC 1.1 (LL) 08/18/2018 1133   RBC 3.74 (L) 08/18/2018 1133   HGB 11.4 (L) 08/18/2018 1133   HGB 14.2 05/12/2018 0949   HCT 34.5 (L) 08/18/2018 1133   HCT 42.0 05/12/2018 0949   PLT 144 (L) 08/18/2018 1133   PLT 256 05/12/2018 0949   MCV 92.2 08/18/2018 1133   MCV 87 05/12/2018 0949   MCH 30.5 08/18/2018 1133   MCHC 33.0 08/18/2018 1133   RDW 13.9 08/18/2018 1133   RDW 13.0 05/12/2018 0949   LYMPHSABS 0.1 (L) 08/18/2018 1133   LYMPHSABS 1.1 05/12/2018 0949   MONOABS 0.1 08/18/2018 1133   EOSABS 0.0 08/18/2018 1133   EOSABS 0.1 05/12/2018 0949   BASOSABS 0.0 08/18/2018 1133   BASOSABS 0.0 05/12/2018 0949   CMP Latest Ref Rng & Units 08/18/2018 08/10/2018 08/09/2018  Glucose 70 - 99 mg/dL 129(H) 127(H) 135(H)  BUN 8 - 23 mg/dL 13 15 17   Creatinine 0.61 - 1.24 mg/dL 0.80 1.02 0.99  Sodium 135 - 145 mmol/L 133(L) 138 141  Potassium 3.5 - 5.1 mmol/L 4.0 3.3(L) 3.3(L)  Chloride 98 - 111 mmol/L 96(L) 99 98  CO2 22 - 32 mmol/L 30 33(H) 32  Calcium 8.9 - 10.3 mg/dL 8.4(L) 9.1 9.3  Total Protein 6.5 - 8.1 g/dL 6.8 6.6 6.9  Total Bilirubin 0.3 - 1.2 mg/dL 1.1 1.0 0.9  Alkaline Phos 38 - 126 U/L 57 55 52  AST 15 - 41 U/L 19 22 23   ALT 0 - 44 U/L 32 19 19       ASSESSMENT & PLAN:   Malignant neoplasm of tonsil (H[facility]) 1.  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 Benjamine Mola on 05/13/2018, status post biopsy consistent with squamous cell carcinoma, P 16+ -Weight loss of 16 pounds since the start of treatment. - Baseline hearing loss and chronic kidney  disease with creatinine of 1.34. - Oropharyngeal examination shows a necrotic left tonsillar  mass, not clearly visualized secondary to increased gag reflex.  There is a left lower neck lymph node palpable.  I discussed the results of the PET CT scan with the patient which showed level 2, 3, 5 meta stasis on the left neck and mildly metabolic right level 2 lymph node along with the left tonsillar primary.  No other hypermetabolic lesion seen. -I have recommended combination chemoradiation therapy.  He is not a candidate for high-dose cisplatin given his chronic kidney disease and hearing dysfunction.  He is also not a candidate for weekly cisplatin as his creatinine might get worse.  I have recommended 94-01 [place] protocol with 5-FU administered as a 24-hour continuous infusion at a dose of 600 mg per M square per day for 4 days and carboplatin given as a daily bolus of 70 mg/m2/day for 4 days.  Chemo will be given on days 1, 22 and 43.  We will have a port placed by 1 of our surgeons. - He received first cycle of carboplatin and 5-FU on 07/13/2018. - He developed severe mucositis after cycle 1 and lost about 15 pounds. - Had a PEG tube placed on 08/04/2018.  He is taking in 3 cans of Osmolite as bolus.  He is also drinking about 2 cans of Ensure per day.  He is not able to eat any soft foods.  He does not have any severe pain while swallowing. - He received chemotherapy cycle 2 on 08/10/2018.  He developed mucositis again. - He is only taking 3 cans of Osmolite as bolus.  Because of mucositis, he is not drinking Ensure anymore.  I have suggested him to increase Osmolite to 2 cans 3 times a day.  That will give him approximately 2100 cal.  He will use Maalox with lidocaine for mucositis. -I have reviewed his blood work from today.  White count is 1.1 with ANC of 900.  Platelet count and hemoglobin were normal.  Other ultralights were normal.  We will reevaluate him again in 1 week.      Orders placed this encounter:  No orders of the defined types were placed in this encounter.     Derek Jack, MD [place] [phone]

## 2018-08-18 NOTE — Assessment & Plan Note (Signed)
1.  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 Benjamine Mola on 05/13/2018, status post biopsy consistent with squamous cell carcinoma, P 16+ -Weight loss of 16 pounds since the start of treatment. - Baseline hearing loss and chronic kidney disease with creatinine of 1.34. - Oropharyngeal examination shows a necrotic left tonsillar mass, not clearly visualized secondary to increased gag reflex.  There is a left lower neck lymph node palpable.  I discussed the results of the PET CT scan with the patient which showed level 2, 3, 5 meta stasis on the left neck and mildly metabolic right level 2 lymph node along with the left tonsillar primary.  No other hypermetabolic lesion seen. -I have recommended combination chemoradiation therapy.  He is not a candidate for high-dose cisplatin given his chronic kidney disease and hearing dysfunction.  He is also not a candidate for weekly cisplatin as his creatinine might get worse.  I have recommended 94-01 [place] protocol with 5-FU administered as a 24-hour continuous infusion at a dose of 600 mg per M square per day for 4 days and carboplatin given as a daily bolus of 70 mg/m2/day for 4 days.  Chemo will be given on days 1, 22 and 43.  We will have a port placed by 1 of our surgeons. - He received first cycle of carboplatin and 5-FU on 07/13/2018. - He developed severe mucositis after cycle 1 and lost about 15 pounds. - Had a PEG tube placed on 08/04/2018.  He is taking in 3 cans of Osmolite as bolus.  He is also drinking about 2 cans of Ensure per day.  He is not able to eat any soft foods.  He does not have any severe pain while swallowing. - He received chemotherapy cycle 2 on 08/10/2018.  He developed mucositis again. - He is only taking 3 cans of Osmolite as bolus.  Because of mucositis, he is not drinking Ensure anymore.  I have suggested him to increase  Osmolite to 2 cans 3 times a day.  That will give him approximately 2100 cal.  He will use Maalox with lidocaine for mucositis. -I have reviewed his blood work from today.  White count is 1.1 with ANC of 900.  Platelet count and hemoglobin were normal.  Other ultralights were normal.  We will reevaluate him again in 1 week.

## 2018-08-20 ENCOUNTER — Encounter (HOSPITAL_COMMUNITY): Payer: Self-pay | Admitting: Dietician

## 2018-08-20 NOTE — Progress Notes (Signed)
Nutrition Follow-up 67 y/o male PMHx HLD, HOH, CKD, w/ little recent medical follow up. Presented to [facility] 6/12 after referred by local ENT MD for large L tonsillar mass s/p biopsy. + for SCC. At time of presentation, had dysphagia and 5 lb loss x1 month. Begun radiation 7/22 and chemo 7/23. Developed severe mucositis, dysphagia and odynophagia losing ~16 lbs since starting therapy. S/P peg placement 8/14 and start of Tube feeding 8/16.   Pt last seen Tuesday 8/23, at which time his tube feeding was Osmolite 1.5: 1/2 can @ 0800  1 can @ 1200, 1 can @ 1600 1/2 can @ 2000  Was flushing with 60 cc before/after and including a 240 cc free water fluid bolus TID.   At that time, He was also drinking 3 Ensure/day. This TF+oral supplement regimen would provide: ~2115 kcals, 105g Pro, 1083 ml fluid + extra 480 cc free water from flushes and Additional 240cc free water bolus TID.   SLP had seen pt at appt on Wednesday and had notified RD that pt odynophagia had progressed to point he was no longer drinking any oral supplements. He was instructed by MD to increase TF to 6 cans/day. Pt also started on diflucan at that appt (via tube?).His weight at that encounter was 157.3 lbs which was a loss of 4.3 lbs from 5 days prior (8/23). However, the weight on 8/23 was a slight outlier and overall, he has largely stayed between 157-160 since 8/12.   RD calling patient today to follow up remotely.  Today, patients voice is extremely hoarse and unfortunately speech is exceedingly difficult to understand over the phone. He says his pain is excruciating. He was able to get the liquid morphine and says this has helped "a little". He is taking "nothing" by mouth.   He reports he is administering the 6 cans/day as he was instructed. Though difficult to fully understand, it sounds that he administers his TF in following way:  60 ml Flush->Can TF->"bottle water"->Can TF-> 60 ml flush.   He does this 3x. He is unsure of the  fluid ozs in his water bottles.   After infusing, he experience feeling of needing to burp, but says he is physically unable to and he ends up with a "knot in my throat".   He notes he is about out of tube feeding formula.   He says his urine is orange.   RD asked about [facility] application status, but was unable to understand his response.  Wt Readings from Last 10 Encounters:  08/18/18 157 lb 4.8 oz (71.4 kg)  08/13/18 161 lb 9.6 oz (73.3 kg)  08/12/18 159 lb 9.6 oz (72.4 kg)  08/11/18 159 lb (72.1 kg)  08/09/18 157 lb 8 oz (71.4 kg)  08/05/18 156 lb 6.4 oz (70.9 kg)  08/03/18 157 lb 9.6 oz (71.5 kg)  08/03/18 158 lb (71.7 kg)  08/02/18 159 lb 12.8 oz (72.5 kg)  07/19/18 174 lb (78.9 kg)   MEDICATIONS: Chemo: 5FU, Carboplatin Other Meds: Liquid morphine, Percocet, Carafate, Compazine  LABS:  8/28: lab work: Na: 133 (from 138 wk prior), WBC:1.1, Albumin stable at 3.4. Renal labs stable/improved.   Recent Labs  Lab 08/18/18 1133  NA 133*  K 4.0  CL 96*  CO2 30  BUN 13  CREATININE 0.80  CALCIUM 8.4*  GLUCOSE 129*   ANTHROPOMETRICS: Height:  Ht Readings from Last 1 Encounters:  08/05/18 '5\' 8"'  (1.727 m)   Weight:  Wt Readings from Last 1  Encounters:  08/18/18 157 lb 4.8 oz (71.4 kg)   BMI:  BMI Readings from Last 1 Encounters:  08/18/18 23.92 kg/m   UBW: 182 when presenting In June IBW: 70 kg Wt change: +4 lbs since Tuesday.  Re-Estimated needs:  Energy: 2000-2200 kcals (28-31 kcal/kg bw) Protein: 93-107g Pro (1.3-1.5 g/kg bw) Fluid: >2.1  L fluid (30 ml/kcal)  NUTRITION DIAGNOSIS:  Inadequate oral intake related to side effects of chemoradiation as patient report of minimal PO intake and need for PEG   DOCUMENTATION CODES:  Severe malnutrition->returning with new inability to take any oral intake to supplement tube feeding.   INTERVENTION:  His current TF regimen sounds is 6 cans of osmolite 1.5 w/ 60 cc flush before and after and 240 cc free  water bolus TID.   This is providing: 2130 kcals, 89g Pro, 1086 mls + 360 cc from flushes and 720 from fluid boluses.   Regarding eructation after TF. RD recommended spreading his TF/fluid out more. Though, his volume at each feed is only ~840 cc or 28 oz. RD unable to discern response to this suggestion. RD did review basic guidelines to reduce air from getting in tube.   Given report that his urine is orange, RD recommended he add additional 8 oz of water/day. His response to this was "I am doing the best I can". RD acknowledged he has full confidence patient is doing the best he can, but RD is trying to help patient avoid kidney damage. If he is too weak to increase frequency of feeds and is limited by volume, he should prioritize the water.   RD notified [facility] representative of patient only having limited TF formula left  Also apprised [facility] RN navigator and NP of patients current state  Given his poor state, RD noted he would f/u with patient again via phone next week. Next appt at clinic not until week of 9th.   GOAL:  Patient will meet greater than or equal to 90% of their needs  NOT MET  MONITOR:  Labs, Weight trends, TF tolerance, I & O's, PO intake  Next Visit: Via phone next week.   [name] RD, LDN, CNSC Clinical Nutrition Available Tues-Sat via Pager: [phone] 08/20/2018 2:13 PM

## 2018-08-24 ENCOUNTER — Emergency Department (HOSPITAL_COMMUNITY): Payer: Medicare Other

## 2018-08-24 ENCOUNTER — Telehealth (HOSPITAL_COMMUNITY): Payer: Self-pay | Admitting: *Deleted

## 2018-08-24 ENCOUNTER — Other Ambulatory Visit: Payer: Self-pay

## 2018-08-24 ENCOUNTER — Inpatient Hospital Stay (HOSPITAL_COMMUNITY)
Admission: EM | Admit: 2018-08-24 | Discharge: 2018-08-29 | DRG: 808 | Disposition: A | Discharge status: Home Health | Payer: Medicare Other | Attending: Internal Medicine | Admitting: Internal Medicine

## 2018-08-24 ENCOUNTER — Telehealth (HOSPITAL_COMMUNITY): Payer: Self-pay | Admitting: General Practice

## 2018-08-24 ENCOUNTER — Encounter (HOSPITAL_COMMUNITY): Payer: Self-pay | Admitting: Emergency Medicine

## 2018-08-24 DIAGNOSIS — K1233 Oral mucositis (ulcerative) due to radiation: Secondary | ICD-10-CM | POA: Diagnosis present

## 2018-08-24 DIAGNOSIS — Y842 Radiological procedure and radiotherapy as the cause of abnormal reaction of the patient, or of later complication, without mention of misadventure at the time of the procedure: Secondary | ICD-10-CM | POA: Diagnosis present

## 2018-08-24 DIAGNOSIS — L98491 Non-pressure chronic ulcer of skin of other sites limited to breakdown of skin: Secondary | ICD-10-CM | POA: Diagnosis present

## 2018-08-24 DIAGNOSIS — D61818 Other pancytopenia: Secondary | ICD-10-CM | POA: Diagnosis not present

## 2018-08-24 DIAGNOSIS — F4310 Post-traumatic stress disorder, unspecified: Secondary | ICD-10-CM | POA: Diagnosis present

## 2018-08-24 DIAGNOSIS — D709 Neutropenia, unspecified: Secondary | ICD-10-CM | POA: Diagnosis present

## 2018-08-24 DIAGNOSIS — T451X5A Adverse effect of antineoplastic and immunosuppressive drugs, initial encounter: Secondary | ICD-10-CM | POA: Diagnosis present

## 2018-08-24 DIAGNOSIS — C099 Malignant neoplasm of tonsil, unspecified: Secondary | ICD-10-CM | POA: Diagnosis present

## 2018-08-24 DIAGNOSIS — D61810 Antineoplastic chemotherapy induced pancytopenia: Secondary | ICD-10-CM | POA: Diagnosis present

## 2018-08-24 DIAGNOSIS — Z79899 Other long term (current) drug therapy: Secondary | ICD-10-CM

## 2018-08-24 DIAGNOSIS — R590 Localized enlarged lymph nodes: Secondary | ICD-10-CM | POA: Diagnosis present

## 2018-08-24 DIAGNOSIS — Z87891 Personal history of nicotine dependence: Secondary | ICD-10-CM | POA: Diagnosis not present

## 2018-08-24 DIAGNOSIS — E871 Hypo-osmolality and hyponatremia: Secondary | ICD-10-CM | POA: Diagnosis present

## 2018-08-24 DIAGNOSIS — H9190 Unspecified hearing loss, unspecified ear: Secondary | ICD-10-CM | POA: Diagnosis present

## 2018-08-24 DIAGNOSIS — K1379 Other lesions of oral mucosa: Secondary | ICD-10-CM | POA: Diagnosis present

## 2018-08-24 DIAGNOSIS — R5081 Fever presenting with conditions classified elsewhere: Secondary | ICD-10-CM | POA: Diagnosis present

## 2018-08-24 DIAGNOSIS — Z6822 Body mass index (BMI) 22.0-22.9, adult: Secondary | ICD-10-CM

## 2018-08-24 DIAGNOSIS — R1310 Dysphagia, unspecified: Secondary | ICD-10-CM | POA: Diagnosis present

## 2018-08-24 DIAGNOSIS — E785 Hyperlipidemia, unspecified: Secondary | ICD-10-CM | POA: Diagnosis present

## 2018-08-24 DIAGNOSIS — E860 Dehydration: Secondary | ICD-10-CM | POA: Diagnosis present

## 2018-08-24 DIAGNOSIS — Z9221 Personal history of antineoplastic chemotherapy: Secondary | ICD-10-CM | POA: Diagnosis not present

## 2018-08-24 DIAGNOSIS — Z923 Personal history of irradiation: Secondary | ICD-10-CM | POA: Diagnosis not present

## 2018-08-24 DIAGNOSIS — R634 Abnormal weight loss: Secondary | ICD-10-CM | POA: Diagnosis not present

## 2018-08-24 DIAGNOSIS — E876 Hypokalemia: Secondary | ICD-10-CM | POA: Diagnosis present

## 2018-08-24 DIAGNOSIS — K1230 Oral mucositis (ulcerative), unspecified: Secondary | ICD-10-CM | POA: Diagnosis not present

## 2018-08-24 DIAGNOSIS — R509 Fever, unspecified: Secondary | ICD-10-CM | POA: Diagnosis present

## 2018-08-24 DIAGNOSIS — J312 Chronic pharyngitis: Secondary | ICD-10-CM | POA: Diagnosis present

## 2018-08-24 DIAGNOSIS — K1370 Unspecified lesions of oral mucosa: Secondary | ICD-10-CM | POA: Diagnosis not present

## 2018-08-24 DIAGNOSIS — E43 Unspecified severe protein-calorie malnutrition: Secondary | ICD-10-CM | POA: Diagnosis present

## 2018-08-24 DIAGNOSIS — Z931 Gastrostomy status: Secondary | ICD-10-CM | POA: Diagnosis not present

## 2018-08-24 LAB — COMPREHENSIVE METABOLIC PANEL
ALBUMIN: 2.9 g/dL — AB (ref 3.5–5.0)
ALT: 23 U/L (ref 0–44)
AST: 20 U/L (ref 15–41)
Alkaline Phosphatase: 52 U/L (ref 38–126)
Anion gap: 13 (ref 5–15)
BUN: 25 mg/dL — AB (ref 8–23)
CHLORIDE: 94 mmol/L — AB (ref 98–111)
CO2: 25 mmol/L (ref 22–32)
CREATININE: 1.06 mg/dL (ref 0.61–1.24)
Calcium: 8.5 mg/dL — ABNORMAL LOW (ref 8.9–10.3)
GFR calc Af Amer: >60 mL/min (ref >60)
GFR calc non Af Amer: >60 mL/min (ref >60)
Glucose, Bld: 122 mg/dL — ABNORMAL HIGH (ref 70–99)
POTASSIUM: 3.5 mmol/L (ref 3.5–5.1)
SODIUM: 132 mmol/L — AB (ref 135–145)
Total Bilirubin: 1.8 mg/dL — ABNORMAL HIGH (ref 0.3–1.2)
Total Protein: 7.3 g/dL (ref 6.5–8.1)

## 2018-08-24 LAB — CBC WITH DIFFERENTIAL/PLATELET
Basophils Absolute: 0 10*3/uL (ref 0.0–0.1)
Basophils Relative: 1 %
EOS PCT: 1 %
Eosinophils Absolute: 0 10*3/uL (ref 0.0–0.7)
HEMATOCRIT: 30.1 % — AB (ref 39.0–52.0)
HEMOGLOBIN: 10.2 g/dL — AB (ref 13.0–17.0)
LYMPHS ABS: 0.2 10*3/uL — AB (ref 0.7–4.0)
Lymphocytes Relative: 20 %
MCH: 30.9 pg (ref 26.0–34.0)
MCHC: 33.9 g/dL (ref 30.0–36.0)
MCV: 91.2 fL (ref 78.0–100.0)
MONO ABS: 0.4 10*3/uL (ref 0.1–1.0)
Monocytes Relative: 45 %
Neutro Abs: 0.3 10*3/uL — ABNORMAL LOW (ref 1.7–7.7)
Neutrophils Relative %: 33 %
Platelets: 37 10*3/uL — ABNORMAL LOW (ref 150–400)
RBC: 3.3 MIL/uL — ABNORMAL LOW (ref 4.22–5.81)
RDW: 13.7 % (ref 11.5–15.5)
WBC: 0.9 10*3/uL — CL (ref 4.0–10.5)

## 2018-08-24 LAB — I-STAT CG4 LACTIC ACID, ED
LACTIC ACID, VENOUS: 1.54 mmol/L (ref 0.5–1.9)
Lactic Acid, Venous: 2.43 mmol/L (ref 0.5–1.9)

## 2018-08-24 LAB — URINALYSIS, ROUTINE W REFLEX MICROSCOPIC
Bilirubin Urine: NEGATIVE
GLUCOSE, UA: NEGATIVE mg/dL
HGB URINE DIPSTICK: NEGATIVE
KETONES UR: NEGATIVE mg/dL
LEUKOCYTES UA: NEGATIVE
NITRITE: NEGATIVE
PROTEIN: 30 mg/dL — AB
Specific Gravity, Urine: 1.02 (ref 1.005–1.030)
pH: 5 (ref 5.0–8.0)

## 2018-08-24 MED ORDER — LIDOCAINE-PRILOCAINE 2.5-2.5 % EX CREA: 1.0000 "application " | TOPICAL_CREAM | CUTANEOUS | Status: DC

## 2018-08-24 MED ORDER — SODIUM CHLORIDE 0.9 % IV SOLN
2.0000 g | Freq: Three times a day (TID) | INTRAVENOUS | Status: DC
  Administered 2018-08-24 – 2018-08-28 (×11): 2 g via INTRAVENOUS
  Filled 2018-08-24 (×19): qty 2

## 2018-08-24 MED ORDER — IOHEXOL 300 MG/ML  SOLN
75.0000 mL | Freq: Once | INTRAMUSCULAR | Status: AC | PRN
  Administered 2018-08-24: 75 mL via INTRAVENOUS

## 2018-08-24 MED ORDER — ACETAMINOPHEN 650 MG RE SUPP: 650.0000 mg | Freq: Four times a day (QID) | RECTAL | Status: DC | PRN

## 2018-08-24 MED ORDER — PROCHLORPERAZINE MALEATE 5 MG PO TABS: 10.0000 mg | ORAL_TABLET | Freq: Four times a day (QID) | ORAL | Status: DC | PRN

## 2018-08-24 MED ORDER — IBUPROFEN 400 MG PO TABS: 400.0000 mg | ORAL_TABLET | Freq: Four times a day (QID) | ORAL | Status: DC | PRN

## 2018-08-24 MED ORDER — SODIUM CHLORIDE 0.9 % IV BOLUS
1000.0000 mL | Freq: Once | INTRAVENOUS | Status: AC
  Administered 2018-08-24: 1000 mL via INTRAVENOUS

## 2018-08-24 MED ORDER — SUCRALFATE 1 GM/10ML PO SUSP
1.0000 g | Freq: Three times a day (TID) | ORAL | Status: DC
  Administered 2018-08-25 – 2018-08-29 (×14): 1 g via ORAL
  Filled 2018-08-24 (×16): qty 10

## 2018-08-24 MED ORDER — POTASSIUM CHLORIDE IN NACL 20-0.9 MEQ/L-% IV SOLN
INTRAVENOUS | Status: DC
  Administered 2018-08-24 – 2018-08-29 (×8): via INTRAVENOUS

## 2018-08-24 MED ORDER — VANCOMYCIN HCL IN DEXTROSE 1-5 GM/200ML-% IV SOLN
1000.0000 mg | Freq: Once | INTRAVENOUS | Status: AC
  Administered 2018-08-24: 1000 mg via INTRAVENOUS
  Filled 2018-08-24: qty 200

## 2018-08-24 MED ORDER — SODIUM CHLORIDE 0.9 % IV SOLN
2.0000 g | Freq: Once | INTRAVENOUS | Status: AC
  Administered 2018-08-24: 2 g via INTRAVENOUS
  Filled 2018-08-24: qty 2

## 2018-08-24 MED ORDER — ACETAMINOPHEN 500 MG PO TABS
1000.0000 mg | ORAL_TABLET | Freq: Once | ORAL | Status: AC
  Administered 2018-08-24: 1000 mg via ORAL

## 2018-08-24 MED ORDER — ONDANSETRON HCL 4 MG PO TABS: 4.0000 mg | ORAL_TABLET | Freq: Four times a day (QID) | ORAL | Status: DC | PRN

## 2018-08-24 MED ORDER — FLUCONAZOLE 40 MG/ML PO SUSR
100.0000 mg | Freq: Every day | ORAL | Status: DC
  Administered 2018-08-25 – 2018-08-29 (×4): 100 mg
  Filled 2018-08-24 (×2): qty 2.5
  Filled 2018-08-24: qty 10
  Filled 2018-08-24 (×3): qty 2.5

## 2018-08-24 MED ORDER — METRONIDAZOLE IN NACL 5-0.79 MG/ML-% IV SOLN
500.0000 mg | Freq: Three times a day (TID) | INTRAVENOUS | Status: DC
  Administered 2018-08-24 – 2018-08-28 (×12): 500 mg via INTRAVENOUS
  Filled 2018-08-24 (×12): qty 100

## 2018-08-24 MED ORDER — ONDANSETRON HCL 4 MG/2ML IJ SOLN: 4.0000 mg | Freq: Four times a day (QID) | INTRAMUSCULAR | Status: DC | PRN

## 2018-08-24 MED ORDER — VANCOMYCIN HCL IN DEXTROSE 750-5 MG/150ML-% IV SOLN
750.0000 mg | Freq: Two times a day (BID) | INTRAVENOUS | Status: DC
  Administered 2018-08-25 – 2018-08-28 (×7): 750 mg via INTRAVENOUS
  Filled 2018-08-24 (×11): qty 150

## 2018-08-24 MED ORDER — LIDOCAINE VISCOUS HCL 2 % MT SOLN
15.0000 mL | Freq: Four times a day (QID) | OROMUCOSAL | Status: DC
  Administered 2018-08-25 – 2018-08-29 (×11): 15 mL via OROMUCOSAL
  Filled 2018-08-24 (×14): qty 15

## 2018-08-24 MED ORDER — ACETAMINOPHEN 325 MG PO TABS: 650.0000 mg | ORAL_TABLET | Freq: Four times a day (QID) | ORAL | Status: DC | PRN

## 2018-08-24 MED ORDER — ENSURE ENLIVE PO LIQD
237.0000 mL | Freq: Three times a day (TID) | ORAL | Status: DC
  Administered 2018-08-25: 237 mL

## 2018-08-24 MED ORDER — VANCOMYCIN HCL IN DEXTROSE 750-5 MG/150ML-% IV SOLN
INTRAVENOUS | Status: AC
  Filled 2018-08-24: qty 150

## 2018-08-24 MED ORDER — MORPHINE SULFATE (CONCENTRATE) 10 MG/0.5ML PO SOLN
10.0000 mg | ORAL | Status: DC | PRN
  Administered 2018-08-25 – 2018-08-26 (×3): 10 mg via ORAL
  Filled 2018-08-24 (×4): qty 0.5

## 2018-08-24 MED ORDER — ACETAMINOPHEN 500 MG PO TABS
ORAL_TABLET | ORAL | Status: AC
  Administered 2018-08-24: 1000 mg via ORAL
  Filled 2018-08-24: qty 2

## 2018-08-24 NOTE — Telephone Encounter (Signed)
[facility] CSW Progress Notes  Call to patient to assess for needs/resources.  Per dietitian, patient is struggling w affording gas and may also need additional help as expenses have mounted up.  Has told dietitian that he cannot afford medications.  Financial advocate aware and is working w patient.  No answer, left VM encouraging him to call back.    Edwyna Shell, LCSW Clinical Social Worker Phone:  [phone]

## 2018-08-24 NOTE — ED Notes (Signed)
Date and time results received: 08/24/18 4:55 PM   Test: platelets Critical Value: 37  Name of Provider Notified: J. Long  Orders Received? Or Actions Taken?: no new orders at this time.

## 2018-08-24 NOTE — Telephone Encounter (Signed)
Verbal order given to [facility] with [facility] for 3 additional weeks of in home assistance for the pt per Dr. Raliegh Ip.

## 2018-08-24 NOTE — Progress Notes (Signed)
Pharmacy Antibiotic Note  [name] is a 67 y.o. male admitted on 08/24/2018 with Febrile Neutropenia.  Pharmacy has been consulted for Vancomycin and Cefepime dosing.  Initial doses given in ED.  Plan: Vancomycin 750mg  IV every 12 hours.  Goal trough 15-20 mcg/mL.  Cefepime 2gm IV every 8 hours. Monitor labs, micro and vitals.   Height: 5\' 8"  (172.7 cm) Weight: 147 lb (66.7 kg) IBW/kg (Calculated) : 68.4  Temp (24hrs), Avg:99.2 F (37.3 C), Min:97.6 F (36.4 C), Max:100.8 F (38.2 C)  Recent Labs  Lab 08/18/18 1133 08/24/18 1538 08/24/18 1542 08/24/18 1818  WBC 1.1* 0.9*  --   --   CREATININE 0.80 1.06  --   --   LATICACIDVEN  --   --  1.54 2.43*    Estimated Creatinine Clearance: 62.9 mL/min (by C-G formula based on SCr of 1.06 mg/dL).    No Known Allergies  Antimicrobials this admission: Vanc 9/3 >>  Cefepime 9/3 >>  Flagyl 9/3 >>  Dose adjustments this admission: n/a   Microbiology results: 9/3 BCx: pending 9/3 UCx: pending   Sputum:    MRSA PCR:   Thank you for allowing pharmacy to be a part of this patient's care.  Pricilla Larsson 08/24/2018 10:30 PM

## 2018-08-24 NOTE — Progress Notes (Signed)
ANTIBIOTIC CONSULT NOTE - INITIAL  Pharmacy Consult for vancomycin and cefepime dosing   Indication: unknown source  No Known Allergies  Patient Measurements: Height: 5\' 8"  (172.7 cm) Weight: 147 lb (66.7 kg) IBW/kg (Calculated) : 68.4 Adjusted Body Weight: n/a  Vital Signs: Temp: 100.8 F (38.2 C) (09/03 1512) Temp Source: Rectal (09/03 1512) BP: 125/70 (09/03 1530) Pulse Rate: 114 (09/03 1530) Intake/Output from previous day: No intake/output data recorded. Intake/Output from this shift: No intake/output data recorded.  Labs: Recent Labs    08/24/18 1538  WBC 0.9*  HGB 10.2*  PLT PENDING  CREATININE 1.06   Estimated Creatinine Clearance: 62.9 mL/min (by C-G formula based on SCr of 1.06 mg/dL). No results for input(s): VANCOTROUGH, VANCOPEAK, VANCORANDOM, GENTTROUGH, GENTPEAK, GENTRANDOM, TOBRATROUGH, TOBRAPEAK, TOBRARND, AMIKACINPEAK, AMIKACINTROU, AMIKACIN in the last 72 hours.   Microbiology: No results found for this or any previous visit (from the past 720 hour(s)).  Medical History: Past Medical History:  Diagnosis Date  . HOH (hard of hearing)   . Hyperlipidemia   . Mass of oral cavity 05/12/2018  . Positive FIT (fecal immunochemical test) 05/12/2018  . PTSD (post-traumatic stress disorder)    has not been offically diagnosised  . Tonsillar cancer ([place]) 06/02/2018    Assessment: Pharmacy consulted to dose cefepime and vancomycin for this 76 yom with infection of unknown source.  Patient has a history of tonsillar cancer and had EGD with PEG placement on 08-04-18.   Goal of Therapy:  Vancomycin trough level 15-20 mcg/ml  Plan: Give vancomycin  1g IV x1 dose Give cefepime 2g IV x1 dose F/U  floor orders   Despina Pole 08/24/2018,4:48 PM

## 2018-08-24 NOTE — ED Notes (Signed)
Date and time results received: 08/24/18 1836  (use smartphrase ".now" to insert current time)  Test: Lactic Acid Critical Value: 2.43  Name of Provider Notified: Dr Laverta Baltimore Orders Received? Or Actions Taken?:NA

## 2018-08-24 NOTE — H&P (Signed)
History and Physical    [name] EXB:[id number] DOB: June 27, 1951 DOA: 08/24/2018  PCP: [name] Ip, DO   Patient coming from: Home.  I have personally briefly reviewed patient's old medical records in [facility]  Chief Complaint: Fever.  HPI: [name] is a 67 y.o. male with medical history significant of impaired hearing, hyperlipidemia, PTSD, tonsillar cancer, massive oral cavity with progressive dysphagia, PEG tube placement on May 04, 2018 followed by Dr. Ellin Saba at the cancer center with chemoradiation therapy who is coming to the emergency department due to fever.  He has chronic sore throat and neck discomfort from radiation therapy.  He denies productive cough, emesis, diarrhea, dysuria or frequency.  He gets occasional nausea.  He denies chest pain, palpitations, dizziness, diaphoresis, PND, orthopnea or pitting edema of the lower extremities.  No melena or hematochezia.  No hematuria.  No polyuria, polydipsia, polyphagia or blurred vision.  He has lost a significant amount of weight.  ED Course: He received at 1000 mL bolus, vancomycin, cefepime and metronidazole in the emergency department.  His work-up shows a urinalysis with amber color, cloudy appearance, 30 mg of proteinuria, 6-10 WBC per hpf and rare bacteria.  CBC shows a white count of 0.9 with ANC being around 270.  Hemoglobin was 10.2 g/dL and platelets 37.CMP shows sodium 132, potassium 3.5, chloride 94 and CO2 25.  Millimol/L.  BUN 25, creatinine 1.06, glucose 122 and calcium 8.5 mg/dL.  LFTs shows an albumin of 2.9 g/dL and total bilirubin of 1.8 mg/dL.  The rest of the hepatic function tests are within normal limits.  Review of Systems: As per HPI otherwise 10 point review of systems negative.  Past Medical History:  Diagnosis Date  . HOH (hard of hearing)   . Hyperlipidemia   . Mass of oral cavity 05/12/2018  . Positive FIT (fecal immunochemical test) 05/12/2018  . PTSD (post-traumatic  stress disorder)    has not been offically diagnosised  . Tonsillar cancer (HCC) 06/02/2018    Past Surgical History:  Procedure Laterality Date  . APPENDECTOMY  1969  . COLONOSCOPY W/ POLYPECTOMY     remote past  . ESOPHAGOGASTRODUODENOSCOPY  08/04/2018   Dr. Lovell Sheehan: erythema of larynx, normal stomach, normal duodenum. PEG Placed 20 F, bumper at 2.5 cm  . ESOPHAGOGASTRODUODENOSCOPY (EGD) WITH PROPOFOL N/A 08/04/2018   Procedure: ESOPHAGOGASTRODUODENOSCOPY (EGD) WITH PROPOFOL;  Surgeon: Franky Macho, MD;  Location: [facility];  Service: Gastroenterology;  Laterality: N/A;  . FINGER FRACTURE SURGERY Left   . MULTIPLE EXTRACTIONS WITH ALVEOLOPLASTY N/A 06/15/2018   Procedure: Extraction of tooth #'s 2,12,13,18,20,and 21 with alveoloplasty and gross debridement of remaining teeth;  Surgeon: Charlynne Pander, DDS;  Location: [facility] OR;  Service: Oral Surgery;  Laterality: N/A;  . PEG PLACEMENT N/A 08/04/2018   Procedure: PERCUTANEOUS ENDOSCOPIC GASTROSTOMY (PEG) PLACEMENT;  Surgeon: Franky Macho, MD;  Location: [facility];  Service: Gastroenterology;  Laterality: N/A;  . PORTACATH PLACEMENT Left 07/02/2018   Procedure: INSERTION PORT-A-CATH;  Surgeon: Franky Macho, MD;  Location: [facility];  Service: General;  Laterality: Left;     reports that he quit smoking about 19 years ago. His smoking use included cigarettes. He quit after 15.00 years of use. He has never used smokeless tobacco. He reports that he drinks about 12.0 standard drinks of alcohol per week. He reports that he does not use drugs.  No Known Allergies  Family History  Problem Relation Age of Onset  . Diabetes  Father        died at age 39   . Colon cancer Neg Hx     Prior to Admission medications   Medication Sig Start Date End Date Taking? Authorizing Provider  fluconazole (DIFLUCAN) 10 MG/ML suspension Place 100 mg into feeding tube daily.  08/18/18 09/02/18 Yes [provider]  ibuprofen (ADVIL,MOTRIN) 200 MG  tablet Take 400 mg by mouth every 6 (six) hours as needed for moderate pain.    Yes [provider]  lidocaine (XYLOCAINE) 2 % solution Viscous lidocaine 2%/ Maalox 1:1 mixture. Swish and swallow 1 tablespoon four times a day100 Patient taking differently: Use as directed 15 mLs in the mouth or throat 4 (four) times daily. Viscous lidocaine 2%/ Maalox 1:1 mixture. Swish and swallow 1 tablespoon four times a day100 08/14/18  Yes Doreatha Massed, MD  lidocaine-prilocaine (EMLA) cream Apply to affected area once Patient taking differently: Apply 1 application topically as directed. Apply to affected area once 06/29/18  Yes Doreatha Massed, MD  Morphine Sulfate (MORPHINE CONCENTRATE) 10 mg / 0.5 ml concentrated solution Take 0.5 mLs (10 mg total) by mouth every 3 (three) hours as needed for severe pain. 08/02/18  Yes Lockamy, Randi L, NP-C  prochlorperazine (COMPAZINE) 10 MG tablet Take 1 tablet (10 mg total) by mouth every 6 (six) hours as needed (Nausea or vomiting). 06/29/18  Yes Doreatha Massed, MD  sodium fluoride (FLUORISHIELD) 1.1 % GEL dental gel Instill one drop of gel per tooth space of fluoride tray. Place over teeth for 5 minutes. Remove. Spit out excess. Repeat nightly. Patient taking differently: Place 1 drop onto teeth See admin instructions. Instill one drop of gel per tooth space of fluoride tray. Place over teeth for 5 minutes. Remove. Spit out excess. Repeat nightly. 06/18/18  Yes Charlynne Pander, DDS  sucralfate (CARAFATE) 1 GM/10ML suspension Carafate 1gm/63ml & Viscous lidocaine 2% 1:1 mixture.  Swish and swallow 1 tablespoon four times a day. Patient taking differently: Take by mouth 4 (four) times daily. Carafate 1gm/52ml & Viscous lidocaine 2% 1:1 mixture.  Swish and swallow 1 tablespoon four times a day. 07/26/18  Yes Doreatha Massed, MD  oxyCODONE-acetaminophen (PERCOCET) 5-325 MG tablet Take one tablet by mouth every 4 hours as needed for pain. 06/15/18    Charlynne Pander, DDS    Physical Exam: Vitals:   08/24/18 1930 08/24/18 1937 08/24/18 2000 08/24/18 2030  BP: 115/63  125/70 (!) 105/58  Pulse: 98  (!) 103 94  Resp: 12  20 18   Temp:      TempSrc:      SpO2: 97% 97% 98% 98%  Weight:      Height:        Constitutional: Looks chronically ill. Eyes: PERRL, lids and conjunctivae normal ENMT: Mucous membranes are moist. Posterior pharynx looks erythematous and has ulcerations.  Unable to fully visualize. Neck: normal, supple, no masses, no thyromegaly.  Positive adenopathy and left-sided anterior cervical chain. Respiratory: Good air movement, mild rhonchi bilaterally, no wheezing, no crackles. Normal respiratory effort. No accessory muscle use.  Cardiovascular: Regular rate and rhythm, no murmurs / rubs / gallops. No extremity edema. 2+ pedal pulses. No carotid bruits.  Abdomen: PEG tube in place, soft, no tenderness, no masses palpated. No hepatosplenomegaly. Bowel sounds positive.  Musculoskeletal: no clubbing / cyanosis. Good ROM, no contractures. Normal muscle tone.  Skin: Erythema and ulceration of the skin of the anterior cervical area. Neurologic: CN 2-12 grossly intact. Sensation intact, DTR normal. Strength 5/5  in all 4.  Psychiatric: Normal judgment and insight. Alert and oriented x 3. Normal mood.   Labs on Admission: I have personally reviewed following labs and imaging studies  CBC: Recent Labs  Lab 08/18/18 1133 08/24/18 1538  WBC 1.1* 0.9*  NEUTROABS 0.9* 0.3*  HGB 11.4* 10.2*  HCT 34.5* 30.1*  MCV 92.2 91.2  PLT 144* 37*   Basic Metabolic Panel: Recent Labs  Lab 08/18/18 1133 08/24/18 1538  NA 133* 132*  K 4.0 3.5  CL 96* 94*  CO2 30 25  GLUCOSE 129* 122*  BUN 13 25*  CREATININE 0.80 1.06  CALCIUM 8.4* 8.5*   GFR: Estimated Creatinine Clearance: 62.9 mL/min (by C-G formula based on SCr of 1.06 mg/dL). Liver Function Tests: Recent Labs  Lab 08/18/18 1133 08/24/18 1538  AST 19 20  ALT 32 23   ALKPHOS 57 52  BILITOT 1.1 1.8*  PROT 6.8 7.3  ALBUMIN 3.4* 2.9*   No results for input(s): LIPASE, AMYLASE in the last 168 hours. No results for input(s): AMMONIA in the last 168 hours. Coagulation Profile: No results for input(s): INR, PROTIME in the last 168 hours. Cardiac Enzymes: No results for input(s): CKTOTAL, CKMB, CKMBINDEX, TROPONINI in the last 168 hours. BNP (last 3 results) No results for input(s): PROBNP in the last 8760 hours. HbA1C: No results for input(s): HGBA1C in the last 72 hours. CBG: No results for input(s): GLUCAP in the last 168 hours. Lipid Profile: No results for input(s): CHOL, HDL, LDLCALC, TRIG, CHOLHDL, LDLDIRECT in the last 72 hours. Thyroid Function Tests: No results for input(s): TSH, T4TOTAL, FREET4, T3FREE, THYROIDAB in the last 72 hours. Anemia Panel: No results for input(s): VITAMINB12, FOLATE, FERRITIN, TIBC, IRON, RETICCTPCT in the last 72 hours. Urine analysis:    Component Value Date/Time   COLORURINE AMBER (A) 08/24/2018 1527   APPEARANCEUR CLOUDY (A) 08/24/2018 1527   LABSPEC 1.020 08/24/2018 1527   PHURINE 5.0 08/24/2018 1527   GLUCOSEU NEGATIVE 08/24/2018 1527   HGBUR NEGATIVE 08/24/2018 1527   BILIRUBINUR NEGATIVE 08/24/2018 1527   KETONESUR NEGATIVE 08/24/2018 1527   PROTEINUR 30 (A) 08/24/2018 1527   NITRITE NEGATIVE 08/24/2018 1527   LEUKOCYTESUR NEGATIVE 08/24/2018 1527    Radiological Exams on Admission: Dg Chest 2 View  Result Date: 08/24/2018 CLINICAL DATA:  Fever EXAM: CHEST - 2 VIEW COMPARISON:  07/02/2018 FINDINGS: Left-sided central venous port tip over the SVC. No acute airspace disease or effusion. Stable cardiomediastinal silhouette with aortic atherosclerosis. No pneumothorax. Degenerative changes of the spine. IMPRESSION: No active cardiopulmonary disease. Electronically Signed   By: Jasmine Pang M.D.   On: 08/24/2018 17:12   Ct Soft Tissue Neck W Contrast  Result Date: 08/24/2018 CLINICAL DATA:  67 y/o M;  sore throat, stridor, thick sputum, lethargy, dehydration, weakness. Evaluate for abscess. No neck cancer with chemo and radiation therapy. EXAM: CT NECK WITH CONTRAST TECHNIQUE: Multidetector CT imaging of the neck was performed using the standard protocol following the bolus administration of intravenous contrast. CONTRAST:  75mL OMNIPAQUE IOHEXOL 300 MG/ML  SOLN COMPARISON:  05/31/2018 PET-CT FINDINGS: Pharynx and larynx: Decreased soft tissue fullness of left palatine tonsil extending inferiorly along the glossal tonsillar sulcus at site of prior FDG avid tonsillar cancer. Interval development of smooth mucosal enhancement and low-attenuation mucosal thickening of the oropharynx and hypopharynx compatible with radiation mucositis, left-greater-than-right. No abscess or no exophytic mass identified. Salivary glands: No inflammation, mass, or stone. Thyroid: Normal. Lymph nodes: Left cervical FDG avid lymphadenopathy the level 2,  level 3, level 5 levels are now normal in size by imaging criteria. There is fat stranding throughout the anterior and posterior cervical triangles likely representing radiation changes. No new masslike enhancement identified. Vascular: Left port catheter tip extends below the field of view in the SVC. Mild calcific aortic atherosclerosis. Limited intracranial: Negative. Visualized orbits: Negative. Mastoids and visualized paranasal sinuses: Partial opacification of the right mastoid air cells Skeleton: Moderate cervical spondylosis with multilevel disc and facet degenerative changes greatest at the C3-4 and C5-C7 levels. No high-grade bony canal stenosis. Upper chest: Stable left upper lobe 6 mm ground-glass nodule (series 2, image 121), 3 mm nodule in right lung apex (series 2, image 102), and 3 mm juxtapleural nodule of right upper lobe (series 2, image 128). Other: None. IMPRESSION: 1. Interval development of low-attenuation mucosal thickening and smooth enhancement of the oral and  hypopharynx compatible with radiation mucositis. No abscess or new mass identified. 2. Decreased size of left palatine tonsil mass. 3. Left cervical lymphadenopathy identified on prior PET-CT are now normal in size by imaging criteria. No new lymphadenopathy. 4. Stable tiny pulmonary nodules of unlikely significance, attention at follow-up recommended. Electronically Signed   By: Mitzi Hansen M.D.   On: 08/24/2018 18:32    EKG: Independently reviewed. Vent. rate 113 BPM PR interval * ms QRS duration 74 ms QT/QTc 297/408 ms P-R-T axes 73 66 20 Sinus tachycardia Low voltage, precordial leads Probable anteroseptal infarct, old  Assessment/Plan Principal Problem:   Neutropenic fever (HCC) Admit to telemetry/inpatient. Neutropenic precautions. Continue IV fluids. Acetaminophen as needed. Continue cefepime, Flagyl and vancomycin. Follow-up blood cultures and sensitivity. Per Dr. Jacqulyn Bath, oncology will evaluate in a.m.  (Request consult requested)  Active Problems:   Mass of oral cavity Continue treatment and follow-ups as scheduled by hematology/oncology. Continue morphine, Xylocaine and Carafate. Analgesics as needed.    Hyperlipidemia Currently not on medical therapy.    Pancytopenia (HCC) Monitor CBC.    Hyponatremia Continue isotonic IV fluids. Follow-up sodium level.    Hyperbilirubinemia Get ultrasound of right upper quadrant. Follow-up LFTs.   DVT prophylaxis: SCDs. Code Status: Full code. Family Communication:  Disposition Plan: Admit for IV fluids, IV antibiotics for several days. Consults called: Routine hematology/oncology consult. Admission status: Inpatient/telemetry.   Bobette Mo MD [facility] Pager [phone].  If 7PM-7AM, please contact night-coverage [email or web address] Password [facility]  08/24/2018, 8:37 PM

## 2018-08-24 NOTE — ED Triage Notes (Signed)
Pt brought from cancer center.  Current treatment for tonsillar cancer.  Pt c/o of thick sputum, lethargic, dehydration and weakness.

## 2018-08-24 NOTE — ED Provider Notes (Signed)
Emergency Department Provider Note   I have reviewed the triage vital signs and the nursing notes.   HISTORY  Chief Complaint Weakness   HPI [name] is a 67 y.o. male with PMH of HLD, PTSD, and tonsillar cancer currently undergoing chemotherapy and radiation presents to the emergency department for evaluation of increased sputum, weakness, dehydration.  Patient found to be febrile on arrival to the emergency department.  Patient denies significant worsening pain and did not notice fevers at home.  Denies any radiation of symptoms or other modifying factors.  No chest pain or productive cough.  No abdominal discomfort.  No vomiting or diarrhea. Pain is moderate and constant.   Primary Oncologist: Dr. Delton Coombes    Past Medical History:  Diagnosis Date  . HOH (hard of hearing)   . Hyperlipidemia   . Mass of oral cavity 05/12/2018  . Positive FIT (fecal immunochemical test) 05/12/2018  . PTSD (post-traumatic stress disorder)    has not been offically diagnosised  . Tonsillar cancer ([place]) 06/02/2018    Patient Active Problem List   Diagnosis Date Noted  . Neutropenic fever ([place]) 08/24/2018  . Problems with swallowing and mastication   . Cellulitis and perichondritis of larynx   . Chronic periodontitis 06/10/2018  . Partial loss of teeth, unspecified edentulism 06/10/2018  . Malignant neoplasm of tonsil ([place]) 06/02/2018  . Mass of oral cavity 05/12/2018  . Positive FIT (fecal immunochemical test) 05/12/2018    Past Surgical History:  Procedure Laterality Date  . APPENDECTOMY  1969  . COLONOSCOPY W/ POLYPECTOMY     remote past  . ESOPHAGOGASTRODUODENOSCOPY  08/04/2018   Dr. Arnoldo Morale: erythema of larynx, normal stomach, normal duodenum. PEG Placed 20 F, bumper at 2.5 cm  . ESOPHAGOGASTRODUODENOSCOPY (EGD) WITH PROPOFOL N/A 08/04/2018   Procedure: ESOPHAGOGASTRODUODENOSCOPY (EGD) WITH PROPOFOL;  Surgeon: Aviva Signs, MD;  [facility];  Service:  Gastroenterology;  Laterality: N/A;  . FINGER FRACTURE SURGERY Left   . MULTIPLE EXTRACTIONS WITH ALVEOLOPLASTY N/A 06/15/2018   Procedure: Extraction of tooth #'s 2,12,13,18,20,and 21 with alveoloplasty and gross debridement of remaining teeth;  Surgeon: Lenn Cal, DDS;  Location: [place];  Service: Oral Surgery;  Laterality: N/A;  . PEG PLACEMENT N/A 08/04/2018   Procedure: PERCUTANEOUS ENDOSCOPIC GASTROSTOMY (PEG) PLACEMENT;  Surgeon: Aviva Signs, MD;  [facility];  Service: Gastroenterology;  Laterality: N/A;  . PORTACATH PLACEMENT Left 07/02/2018   Procedure: INSERTION PORT-A-CATH;  Surgeon: Aviva Signs, MD;  Location: [facility];  Service: General;  Laterality: Left;    Allergies Patient has no known allergies.  Family History  Problem Relation Age of Onset  . Diabetes Father        died at age 67   . Colon cancer Neg Hx     Social History Social History   Tobacco Use  . Smoking status: Former Smoker    Years: 15.00    Types: Cigarettes    Last attempt to quit: 2000    Years since quitting: 19.6  . Smokeless tobacco: Never Used  Substance Use Topics  . Alcohol use: Yes    Alcohol/week: 12.0 standard drinks    Types: 12 Cans of beer per week  . Drug use: Never    Review of Systems  Constitutional: No fever/chills (found to be febrile here).  Eyes: No visual changes. ENT: Positive sore throat and increased sputum production with known left tonsillar mass.  Cardiovascular: Denies chest pain. Respiratory: Denies shortness of breath.  Gastrointestinal: No abdominal pain.  No nausea, no vomiting.  No diarrhea.  No constipation. Genitourinary: Negative for dysuria. Musculoskeletal: Negative for back pain. Skin: Negative for rash. Neurological: Negative for headaches, focal weakness or numbness.  10-point ROS otherwise negative.  ____________________________________________   PHYSICAL EXAM:  VITAL SIGNS: ED Triage Vitals  Enc Vitals Group      BP 08/24/18 1512 (!) 143/76     Pulse Rate 08/24/18 1512 (!) 115     Resp 08/24/18 1512 18     Temp 08/24/18 1512 (!) 100.8 F (38.2 C)     Temp Source 08/24/18 1512 Oral     SpO2 08/24/18 1512 98 %     Weight 08/24/18 1504 147 lb (66.7 kg)     Height 08/24/18 1504 5\' 8"  (1.727 m)     Pain Score 08/24/18 1504 8   Constitutional: Alert and oriented. Well appearing and in no acute distress. Eyes: Conjunctivae are normal.  Head: Atraumatic. Nose: No congestion/rhinnorhea. Mouth/Throat: Mucous membranes are moist. Patient with posterior pharyngeal ulcerations.  Neck: No stridor.  Cardiovascular: Tachycardia. Good peripheral circulation. Grossly normal heart sounds.   Respiratory: Normal respiratory effort.  No retractions. Lungs CTAB. Gastrointestinal: Soft and nontender. No distention.  Musculoskeletal: No lower extremity tenderness nor edema. No gross deformities of extremities. Neurologic:  Normal speech and language. No gross focal neurologic deficits are appreciated.  Skin:  Skin is warm, dry and intact. No rash noted.  ____________________________________________   LABS (all labs ordered are listed, but only abnormal results are displayed)  Labs Reviewed  COMPREHENSIVE METABOLIC PANEL - Abnormal; Notable for the following components:      Result Value   Sodium 132 (*)    Chloride 94 (*)    Glucose, Bld 122 (*)    BUN 25 (*)    Calcium 8.5 (*)    Albumin 2.9 (*)    Total Bilirubin 1.8 (*)    All other components within normal limits  CBC WITH DIFFERENTIAL/PLATELET - Abnormal; Notable for the following components:   WBC 0.9 (*)    RBC 3.30 (*)    Hemoglobin 10.2 (*)    HCT 30.1 (*)    Platelets 37 (*)    Neutro Abs 0.3 (*)    Lymphs Abs 0.2 (*)    All other components within normal limits  URINALYSIS, ROUTINE W REFLEX MICROSCOPIC - Abnormal; Notable for the following components:   Color, Urine AMBER (*)    APPearance CLOUDY (*)    Protein, ur 30 (*)    Bacteria,  UA RARE (*)    All other components within normal limits  I-STAT CG4 LACTIC ACID, ED - Abnormal; Notable for the following components:   Lactic Acid, Venous 2.43 (*)    All other components within normal limits  CULTURE, BLOOD (ROUTINE X 2)  CULTURE, BLOOD (ROUTINE X 2)  URINE CULTURE  I-STAT CG4 LACTIC ACID, ED   ____________________________________________  EKG   EKG Interpretation  Date/Time:  Tuesday August 24 2018 15:13:02 EDT Ventricular Rate:  113 PR Interval:    QRS Duration: 74 QT Interval:  297 QTC Calculation: 408 R Axis:   66 Text Interpretation:  Sinus tachycardia Low voltage, precordial leads Probable anteroseptal infarct, old No STEMI.  Confirmed by Nanda Quinton [phone]) on 08/24/2018 3:51:08 PM       ____________________________________________  RADIOLOGY  Dg Chest 2 View  Result Date: 08/24/2018 CLINICAL DATA:  Fever EXAM: CHEST - 2 VIEW COMPARISON:  07/02/2018 FINDINGS: Left-sided central venous  port tip over the SVC. No acute airspace disease or effusion. Stable cardiomediastinal silhouette with aortic atherosclerosis. No pneumothorax. Degenerative changes of the spine. IMPRESSION: No active cardiopulmonary disease. Electronically Signed   By: Donavan Foil M.D.   On: 08/24/2018 17:12   Ct Soft Tissue Neck W Contrast  Result Date: 08/24/2018 CLINICAL DATA:  67 y/o M; sore throat, stridor, thick sputum, lethargy, dehydration, weakness. Evaluate for abscess. No neck cancer with chemo and radiation therapy. EXAM: CT NECK WITH CONTRAST TECHNIQUE: Multidetector CT imaging of the neck was performed using the standard protocol following the bolus administration of intravenous contrast. CONTRAST:  51mL OMNIPAQUE IOHEXOL 300 MG/ML  SOLN COMPARISON:  05/31/2018 PET-CT FINDINGS: Pharynx and larynx: Decreased soft tissue fullness of left palatine tonsil extending inferiorly along the glossal tonsillar sulcus at site of prior FDG avid tonsillar cancer. Interval development  of smooth mucosal enhancement and low-attenuation mucosal thickening of the oropharynx and hypopharynx compatible with radiation mucositis, left-greater-than-right. No abscess or no exophytic mass identified. Salivary glands: No inflammation, mass, or stone. Thyroid: Normal. Lymph nodes: Left cervical FDG avid lymphadenopathy the level 2, level 3, level 5 levels are now normal in size by imaging criteria. There is fat stranding throughout the anterior and posterior cervical triangles likely representing radiation changes. No new masslike enhancement identified. Vascular: Left port catheter tip extends below the field of view in the SVC. Mild calcific aortic atherosclerosis. Limited intracranial: Negative. Visualized orbits: Negative. Mastoids and visualized paranasal sinuses: Partial opacification of the right mastoid air cells Skeleton: Moderate cervical spondylosis with multilevel disc and facet degenerative changes greatest at the C3-4 and C5-C7 levels. No high-grade bony canal stenosis. Upper chest: Stable left upper lobe 6 mm ground-glass nodule (series 2, image 121), 3 mm nodule in right lung apex (series 2, image 102), and 3 mm juxtapleural nodule of right upper lobe (series 2, image 128). Other: None. IMPRESSION: 1. Interval development of low-attenuation mucosal thickening and smooth enhancement of the oral and hypopharynx compatible with radiation mucositis. No abscess or new mass identified. 2. Decreased size of left palatine tonsil mass. 3. Left cervical lymphadenopathy identified on prior PET-CT are now normal in size by imaging criteria. No new lymphadenopathy. 4. Stable tiny pulmonary nodules of unlikely significance, attention at follow-up recommended. Electronically Signed   By: Kristine Garbe M.D.   On: 08/24/2018 18:32    ____________________________________________   PROCEDURES  Procedure(s) performed:   Procedures  CRITICAL CARE Performed by: Margette Fast Total critical  care time: 35 minutes Critical care time was exclusive of separately billable procedures and treating other patients. Critical care was necessary to treat or prevent imminent or life-threatening deterioration. Critical care was time spent personally by me on the following activities: development of treatment plan with patient and/or surrogate as well as nursing, discussions with consultants, evaluation of patient's response to treatment, examination of patient, obtaining history from patient or surrogate, ordering and performing treatments and interventions, ordering and review of laboratory studies, ordering and review of radiographic studies, pulse oximetry and re-evaluation of patient's condition.  Nanda Quinton, MD Emergency Medicine  ____________________________________________   INITIAL IMPRESSION / ASSESSMENT AND PLAN / ED COURSE  Pertinent labs & imaging results that were available during my care of the patient were reviewed by me and considered in my medical decision making (see chart for details).  Patient presents to the emergency department for evaluation of increased sputum production and fever.  He is tachycardic here.  He is very well-appearing and maintaining  his airway at this time.  The tonsillar mass is not visible on my exam.  Plan for sepsis evaluation and CT of the neck along with chest x-ray.   Patient with now elevated lactate and neutropenic fever. No clear source at this time. CT neck and CXR reviewed. Spoke with Dr. Delton Coombes with Oncology. Agrees with admit and abx. He will see in consultation in the AM. Plan to admit. Patient maintaining airway and in no acute distress.   Discussed patient's case with Hospitalist, Dr. Olevia Bowens to request admission. Patient and family (if present) updated with plan. Care transferred to Hospitalist service.  I reviewed all nursing notes, vitals, pertinent old records, EKGs, labs, imaging (as  available).  ____________________________________________  FINAL CLINICAL IMPRESSION(S) / ED DIAGNOSES  Final diagnoses:  Neutropenic fever ([place])    MEDICATIONS GIVEN DURING THIS VISIT:  Medications  metroNIDAZOLE (FLAGYL) IVPB 500 mg (0 mg Intravenous Stopped 08/24/18 1823)  sodium chloride 0.9 % bolus 1,000 mL (0 mLs Intravenous Stopped 08/24/18 1657)  ceFEPIme (MAXIPIME) 2 g in sodium chloride 0.9 % 100 mL IVPB (0 g Intravenous Stopped 08/24/18 1823)  vancomycin (VANCOCIN) IVPB 1000 mg/200 mL premix (0 mg Intravenous Stopped 08/24/18 1935)  acetaminophen (TYLENOL) tablet 1,000 mg (1,000 mg Oral Given 08/24/18 1823)  iohexol (OMNIPAQUE) 300 MG/ML solution 75 mL (75 mLs Intravenous Contrast Given 08/24/18 1749)    Note:  This document was prepared using Dragon voice recognition software and may include unintentional dictation errors.  Nanda Quinton, MD Emergency Medicine    Kino Dunsworth, [facility], MD 08/24/18 Thom Chimes

## 2018-08-24 NOTE — ED Notes (Signed)
CRITICAL VALUE ALERT  Critical Value:  WBC 0.9  Date & Time Notied:  1625, 08/24/18  Provider Notified: Dr. Laverta Baltimore  Orders Received/Actions taken: EDP entering orders

## 2018-08-25 ENCOUNTER — Inpatient Hospital Stay (HOSPITAL_COMMUNITY): Payer: Medicare Other

## 2018-08-25 ENCOUNTER — Other Ambulatory Visit (HOSPITAL_COMMUNITY): Payer: Self-pay

## 2018-08-25 DIAGNOSIS — E871 Hypo-osmolality and hyponatremia: Secondary | ICD-10-CM

## 2018-08-25 DIAGNOSIS — E785 Hyperlipidemia, unspecified: Secondary | ICD-10-CM

## 2018-08-25 DIAGNOSIS — R634 Abnormal weight loss: Secondary | ICD-10-CM

## 2018-08-25 DIAGNOSIS — Z9221 Personal history of antineoplastic chemotherapy: Secondary | ICD-10-CM

## 2018-08-25 DIAGNOSIS — C099 Malignant neoplasm of tonsil, unspecified: Secondary | ICD-10-CM

## 2018-08-25 DIAGNOSIS — D709 Neutropenia, unspecified: Principal | ICD-10-CM

## 2018-08-25 DIAGNOSIS — R5081 Fever presenting with conditions classified elsewhere: Secondary | ICD-10-CM

## 2018-08-25 DIAGNOSIS — K1230 Oral mucositis (ulcerative), unspecified: Secondary | ICD-10-CM

## 2018-08-25 DIAGNOSIS — Z923 Personal history of irradiation: Secondary | ICD-10-CM

## 2018-08-25 DIAGNOSIS — K1370 Unspecified lesions of oral mucosa: Secondary | ICD-10-CM

## 2018-08-25 DIAGNOSIS — D61818 Other pancytopenia: Secondary | ICD-10-CM

## 2018-08-25 DIAGNOSIS — Z931 Gastrostomy status: Secondary | ICD-10-CM

## 2018-08-25 DIAGNOSIS — Z87891 Personal history of nicotine dependence: Secondary | ICD-10-CM

## 2018-08-25 DIAGNOSIS — E43 Unspecified severe protein-calorie malnutrition: Secondary | ICD-10-CM

## 2018-08-25 LAB — COMPREHENSIVE METABOLIC PANEL
ALBUMIN: 2.4 g/dL — AB (ref 3.5–5.0)
ALT: 20 U/L (ref 0–44)
ANION GAP: 8 (ref 5–15)
AST: 16 U/L (ref 15–41)
Alkaline Phosphatase: 44 U/L (ref 38–126)
BUN: 22 mg/dL (ref 8–23)
CHLORIDE: 103 mmol/L (ref 98–111)
CO2: 27 mmol/L (ref 22–32)
Calcium: 8 mg/dL — ABNORMAL LOW (ref 8.9–10.3)
Creatinine, Ser: 0.72 mg/dL (ref 0.61–1.24)
Glucose, Bld: 105 mg/dL — ABNORMAL HIGH (ref 70–99)
POTASSIUM: 3.4 mmol/L — AB (ref 3.5–5.1)
SODIUM: 138 mmol/L (ref 135–145)
Total Bilirubin: 1.5 mg/dL — ABNORMAL HIGH (ref 0.3–1.2)
Total Protein: 5.8 g/dL — ABNORMAL LOW (ref 6.5–8.1)

## 2018-08-25 LAB — CBC WITH DIFFERENTIAL/PLATELET
HCT: 25.7 % — ABNORMAL LOW (ref 39.0–52.0)
HEMOGLOBIN: 8.7 g/dL — AB (ref 13.0–17.0)
MCH: 31.1 pg (ref 26.0–34.0)
MCHC: 33.9 g/dL (ref 30.0–36.0)
MCV: 91.8 fL (ref 78.0–100.0)
PLATELETS: 28 10*3/uL — AB (ref 150–400)
RBC: 2.8 MIL/uL — AB (ref 4.22–5.81)
RDW: 13.9 % (ref 11.5–15.5)
WBC: 0.6 10*3/uL — CL (ref 4.0–10.5)

## 2018-08-25 LAB — LACTIC ACID, PLASMA: LACTIC ACID, VENOUS: 1.3 mmol/L (ref 0.5–1.9)

## 2018-08-25 MED ORDER — PRO-STAT SUGAR FREE PO LIQD
30.0000 mL | Freq: Every day | ORAL | Status: DC
  Administered 2018-08-25 – 2018-08-29 (×5): 30 mL
  Filled 2018-08-25 (×5): qty 30

## 2018-08-25 MED ORDER — SILVER SULFADIAZINE 1 % EX CREA
TOPICAL_CREAM | Freq: Two times a day (BID) | CUTANEOUS | Status: DC
  Administered 2018-08-25 – 2018-08-28 (×8): via TOPICAL
  Administered 2018-08-28: 1 via TOPICAL
  Administered 2018-08-29: 16:00:00 via TOPICAL
  Filled 2018-08-25 (×2): qty 85

## 2018-08-25 MED ORDER — JEVITY 1.2 CAL PO LIQD: 1000.0000 mL | ORAL | Status: DC

## 2018-08-25 MED ORDER — FILGRASTIM 300 MCG/ML IJ SOLN
300.0000 ug | Freq: Every day | INTRAMUSCULAR | Status: DC
  Administered 2018-08-25 – 2018-08-26 (×2): 300 ug via SUBCUTANEOUS
  Filled 2018-08-25 (×3): qty 1

## 2018-08-25 MED ORDER — OSMOLITE 1.5 CAL PO LIQD
1000.0000 mL | ORAL | Status: DC
  Administered 2018-08-25 – 2018-08-29 (×5): 1000 mL
  Filled 2018-08-25 (×8): qty 1000

## 2018-08-25 NOTE — Progress Notes (Signed)
Initial Nutrition Assessment  DOCUMENTATION CODES:  Severe malnutrition in context of acute illness/injury  INTERVENTION:  D/C ensure-pt unable to take oral intake  Initiate TF via PEG with Osmolite 1.5 at goal rate of 55 ml/h (1320 ml per day) and Prostat 30 ml q24 to provide 2080 kcals, 98 gm protein, 1006 ml free water daily.  If IVF stopped, recommend beginning 300cc ml fluid boluses q6 hrs for additional 1.2 L fluid   Will speak with CSW after acquiring home suction for pt  NUTRITION DIAGNOSIS:  Severe Malnutrition(Acute) related to cancer and cancer related treatments as evidenced by loss of >5% bw in 1 week and moderate lacute loss of fat depletions  GOAL:  Patient will meet greater than or equal to 90% of their needs  MONITOR:  Labs, Diet advancement, Weight trends, I & O's, TF tolerance  REASON FOR ASSESSMENT:  Consult Enteral/tube feeding initiation and management  ASSESSMENT:  67 y/o male PMHx HLD, HOH, CKD, w/ little recent medical follow up. Presented to [name] 6/12 after referred by local ENT MD for large L tonsillar mass s/p biopsy. + for SCC. At time of presentation, had dysphagia and 5 lb loss x1 month. Begun radiation 7/22 and chemo 7/23. S/P peg placement 8/14 and start of Tube feeding 8/16. Presented to ED 9/3 d/t fever. Admitted due to neutropenic fever.   RD largely unable to comprehend pt today due to his hoarseness/thick sputum from radiotherapy. He is using yankauer suction and removing large, cohesive collections of mucus that are continually clogging device. He confirms the device is helping tremendously with removal of secretions. Would benefit greatly from having this at home. Will speak with CSW regarding this.    Though unable to gather much information from pt today, he is well known to RD as he has been followed closely at [facility] for H/N cancer & PEG feeds. Pt developed rapid worsening of his radiation esophagitis weekend of 8/23-8/25. Oral Intake went  from fair (3x oral supplements/day) to abolutely nothing.  He was instructed to increase his TF at OP appt on  8/28. He was followed up on phone 8/30. He was difficult to understand on phone, though it sounded he hand been having difficulty administering his full TF/fluid regimen. He noted urine was orange and RD had given some TF instructions. He then left call to RD voicemail on Saturday regarding his TF. He showed [facility] Tuesday, at which time nurse navigator promptly took him to ED.  Pt presented to ED at weight of 147 lbs. A loss of 9.7 lbs since appt on 8/28 (6%) bw. Physical exam shows new muscle/fat muscle wasting from prior baseline. Meets criteria for severe malnutrition in acute context.   With admit lab of Bilirubin 1.8, question if patients orange urine had been d/t slight bilirubinuria (unsure what detection level is used in UA).   Discussed with MD in progression meeting. Will restart home tube feeding regimen with some slight adjustments for ulcerated wounds on neck.   Labs: Albumin: 2.4, BUN/Creat: 25/1.06 on admit (13/.8 on 8/28). K:3.4 Meds: Ensure TID. Carafate. Diflucan. IV abx. IV Fluids  Recent Labs  Lab 08/18/18 1133 08/24/18 1538 08/25/18 0544  NA 133* 132* 138  K 4.0 3.5 3.4*  CL 96* 94* 103  CO2 30 25 27   BUN 13 25* 22  CREATININE 0.80 1.06 0.72  CALCIUM 8.4* 8.5* 8.0*  GLUCOSE 129* 122* 105*   NUTRITION - FOCUSED PHYSICAL EXAM:   Most Recent Value  Orbital Region  Moderate depletion  Upper Arm Region  Moderate depletion  Thoracic and Lumbar Region  No depletion  Buccal Region  Moderate depletion  Temple Region  Mild depletion  Clavicle Bone Region  Moderate depletion  Clavicle and Acromion Bone Region  Mild depletion  Scapular Bone Region  Unable to assess  Dorsal Hand  Mild depletion  Patellar Region  Unable to assess  Anterior Thigh Region  Unable to assess  Posterior Calf Region  Unable to assess  Edema (RD Assessment)  None  Hair  Reviewed   Eyes  Reviewed  Mouth  Reviewed  Skin  Reviewed  Nails  Reviewed       Diet Order:   Diet Order            Diet NPO time specified  Diet effective now             EDUCATION NEEDS:  No education needs have been identified at this time  Skin: draining radiotherapy ulcerations-neck  Last BM:  Unknown  Height:  Ht Readings from Last 1 Encounters:  08/24/18 5\' 8"  (1.727 m)   Weight:  Wt Readings from Last 1 Encounters:  08/24/18 66.7 kg   Wt Readings from Last 10 Encounters:  08/24/18 66.7 kg  08/18/18 71.4 kg  08/13/18 73.3 kg  08/12/18 72.4 kg  08/11/18 72.1 kg  08/09/18 71.4 kg  08/05/18 70.9 kg  08/03/18 71.5 kg  08/03/18 71.7 kg  08/02/18 72.5 kg   Ideal Body Weight:  70 kg  BMI:  Body mass index is 22.35 kg/m.  Estimated Nutritional Needs:  Kcal:  2000-2200 kcals (30-33 kcals/kg bw) Protein:  93-107g Pro (1.4-1.6 g/kg bw) Fluid:  >2 Liters (30 ml/kg bw)  [name] RD, LDN, CNSC Clinical Nutrition Available Tues-Sat via Pager: [phone] 08/25/2018 11:05 AM

## 2018-08-25 NOTE — Progress Notes (Signed)
Patient's CBG did not cross over, CBG 116.

## 2018-08-25 NOTE — Consult Note (Signed)
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  Wound type:radiation insult to neck Pressure Injury POA: NA Measurement: 3.6 cm x 2.4 cm x 0.2 cm  Wound FTZ:OQXLL red and weeping Drainage (amount, consistency, odor) minimal serosanguinous  No odor Periwound:slight erythema although neutropenic Dressing procedure/placement/frequency:Cleanse right neck with soap and water and pat gently dry.  Apply Silvadene to wound bed.  Cover with mepitel silicone contact layer for atraumatic dressing removal.  Reapply silvadene every shift.  Change the contact layer Mon/Wed/Fri. Will not follow at this time.  Please re-consult if needed.  Domenic Moras RN BSN [place] Pager [phone]

## 2018-08-25 NOTE — Consult Note (Signed)
[facility] Consultation Oncology  Name: DELMAR ARRIAGA      MRN: [id number]    Location: O115/B262-03  Date: 08/25/2018 Time:4:55 PM   REFERRING PHYSICIAN:  Dr. Olevia Bowens  REASON FOR CONSULT: Left tonsillar squamous cell carcinoma.   DIAGNOSIS: Neutropenic fever.  HISTORY OF PRESENT ILLNESS: Mr. Trimm is a 67 year old pleasant white male who is seen in consultation today for further work-up and management of left tonsillar squamous cell carcinoma and treatment related side effects.  He was started on first cycle of carboplatin and 5-FU along with radiation therapy on 07/13/2018.  He receives radiation therapy at [facility]  He developed severe mucositis after cycle 1 and lost about 15 pounds, resulting in a PEG tube placement on 08/04/2018.  He received cycle 2 chemotherapy on 08/10/2018.  He developed mucositis and was being managed as outpatient.  However he came to the hospital yesterday with fever of 100.8.  He was found to be severely neutropenic.  He was admitted to the hospital for IV antibiotics.  CT scan of the neck was done in the ER.  Because of his elevated bilirubin, and ultrasound of the right upper quadrant was also done.  Patient is stable at this time.  Since admission he has not had any fever.  Tube feeds were started back.  PAST MEDICAL HISTORY:   Past Medical History:  Diagnosis Date  . HOH (hard of hearing)   . Hyperlipidemia   . Mass of oral cavity 05/12/2018  . Positive FIT (fecal immunochemical test) 05/12/2018  . PTSD (post-traumatic stress disorder)    has not been offically diagnosised  . Tonsillar cancer ([place]) 06/02/2018    ALLERGIES: No Known Allergies    MEDICATIONS: I have reviewed the patient's current medications.     PAST SURGICAL HISTORY Past Surgical History:  Procedure Laterality Date  . APPENDECTOMY  1969  . COLONOSCOPY W/ POLYPECTOMY     remote past  . ESOPHAGOGASTRODUODENOSCOPY  08/04/2018   Dr. Arnoldo Morale: erythema of larynx, normal stomach,  normal duodenum. PEG Placed 20 F, bumper at 2.5 cm  . ESOPHAGOGASTRODUODENOSCOPY (EGD) WITH PROPOFOL N/A 08/04/2018   Procedure: ESOPHAGOGASTRODUODENOSCOPY (EGD) WITH PROPOFOL;  Surgeon: Aviva Signs, MD;  [facility];  Service: Gastroenterology;  Laterality: N/A;  . FINGER FRACTURE SURGERY Left   . MULTIPLE EXTRACTIONS WITH ALVEOLOPLASTY N/A 06/15/2018   Procedure: Extraction of tooth #'s 2,12,13,18,20,and 21 with alveoloplasty and gross debridement of remaining teeth;  Surgeon: Lenn Cal, DDS;  Location: [place];  Service: Oral Surgery;  Laterality: N/A;  . PEG PLACEMENT N/A 08/04/2018   Procedure: PERCUTANEOUS ENDOSCOPIC GASTROSTOMY (PEG) PLACEMENT;  Surgeon: Aviva Signs, MD;  [facility];  Service: Gastroenterology;  Laterality: N/A;  . PORTACATH PLACEMENT Left 07/02/2018   Procedure: INSERTION PORT-A-CATH;  Surgeon: Aviva Signs, MD;  Location: [facility];  Service: General;  Laterality: Left;    FAMILY HISTORY: Family History  Problem Relation Age of Onset  . Diabetes Father        died at age 15   . Colon cancer Neg Hx     SOCIAL HISTORY:  reports that he quit smoking about 19 years ago. His smoking use included cigarettes. He quit after 15.00 years of use. He has never used smokeless tobacco. He reports that he drinks about 12.0 standard drinks of alcohol per week. He reports that he does not use drugs.  PERFORMANCE STATUS: The patient's performance status is 1 - Symptomatic but completely ambulatory  PHYSICAL EXAM:  Most Recent Vital Signs: Blood pressure (!) 110/59, pulse 93, temperature 98.5 F (36.9 C), temperature source Oral, resp. rate 18, height '5\' 8"'  (1.727 m), weight 147 lb (66.7 kg), SpO2 99 %. BP (!) 110/59   Pulse 93   Temp 98.5 F (36.9 C) (Oral)   Resp 18   Ht '5\' 8"'  (1.727 m)   Wt 147 lb (66.7 kg)   SpO2 99%   BMI 22.35 kg/m  General appearance: alert and cooperative Head: Normocephalic, without obvious abnormality,  atraumatic Throat: lips, mucosa, and tongue normal; teeth and gums normal and Does have mucositis of the lips and the tongue. Neck: Erythema with skin breakdown particularly on the right side of the neck. Lungs: clear to auscultation bilaterally Heart: regular rate and rhythm Abdomen: soft, non-tender; bowel sounds normal; no masses,  no organomegaly Extremities: extremities normal, atraumatic, no cyanosis or edema  LABORATORY DATA:  Results for orders placed or performed during the hospital encounter of 08/24/18 (from the past 48 hour(s))  Urinalysis, Routine w reflex microscopic     Status: Abnormal   Collection Time: 08/24/18  3:27 PM  Result Value Ref Range   Color, Urine AMBER (A) YELLOW    Comment: BIOCHEMICALS MAY BE AFFECTED BY COLOR   APPearance CLOUDY (A) CLEAR   Specific Gravity, Urine 1.020 1.005 - 1.030   pH 5.0 5.0 - 8.0   Glucose, UA NEGATIVE NEGATIVE mg/dL   Hgb urine dipstick NEGATIVE NEGATIVE   Bilirubin Urine NEGATIVE NEGATIVE   Ketones, ur NEGATIVE NEGATIVE mg/dL   Protein, ur 30 (A) NEGATIVE mg/dL   Nitrite NEGATIVE NEGATIVE   Leukocytes, UA NEGATIVE NEGATIVE   RBC / HPF 0-5 0 - 5 RBC/hpf   WBC, UA 6-10 0 - 5 WBC/hpf   Bacteria, UA RARE (A) NONE SEEN   Squamous Epithelial / LPF 0-5 0 - 5   Mucus PRESENT    Hyaline Casts, UA PRESENT     Comment: Performed at [facility], [address] [place], [place] [address]  Comprehensive metabolic panel     Status: Abnormal   Collection Time: 08/24/18  3:38 PM  Result Value Ref Range   Sodium 132 (L) 135 - 145 mmol/L   Potassium 3.5 3.5 - 5.1 mmol/L   Chloride 94 (L) 98 - 111 mmol/L   CO2 25 22 - 32 mmol/L   Glucose, Bld 122 (H) 70 - 99 mg/dL   BUN 25 (H) 8 - 23 mg/dL   Creatinine, Ser 1.06 0.61 - 1.24 mg/dL   Calcium 8.5 (L) 8.9 - 10.3 mg/dL   Total Protein 7.3 6.5 - 8.1 g/dL   Albumin 2.9 (L) 3.5 - 5.0 g/dL   AST 20 15 - 41 U/L   ALT 23 0 - 44 U/L   Alkaline Phosphatase 52 38 - 126 U/L   Total Bilirubin 1.8  (H) 0.3 - 1.2 mg/dL   GFR calc non Af Amer >60 >60 mL/min   GFR calc Af Amer >60 >60 mL/min    Comment: (NOTE) The eGFR has been calculated using the CKD EPI equation. This calculation has not been validated in all clinical situations. eGFR's persistently <60 mL/min signify possible Chronic Kidney Disease.    Anion gap 13 5 - 15    Comment: Performed at [name], [address] [place], [place] [address]  CBC WITH DIFFERENTIAL     Status: Abnormal   Collection Time: 08/24/18  3:38 PM  Result Value Ref Range   WBC 0.9 (LL) 4.0 -  10.5 K/uL    Comment: REPEATED TO VERIFY CRITICAL RESULT CALLED TO, READ BACK BY AND VERIFIED WITH: RMINTER AT 1623 BY HFLYNT 08/24/18    RBC 3.30 (L) 4.22 - 5.81 MIL/uL   Hemoglobin 10.2 (L) 13.0 - 17.0 g/dL   HCT 30.1 (L) 39.0 - 52.0 %   MCV 91.2 78.0 - 100.0 fL   MCH 30.9 26.0 - 34.0 pg   MCHC 33.9 30.0 - 36.0 g/dL   RDW 13.7 11.5 - 15.5 %   Platelets 37 (L) 150 - 400 K/uL    Comment: CRITICAL RESULT CALLED TO, READ BACK BY AND VERIFIED WITH: LASHLEY,S. AT 1655 ON 08/24/2018 BY BAUGHAM,M.    Neutrophils Relative % 33 %   Lymphocytes Relative 20 %   Monocytes Relative 45 %   Eosinophils Relative 1 %   Basophils Relative 1 %   Neutro Abs 0.3 (L) 1.7 - 7.7 K/uL   Lymphs Abs 0.2 (L) 0.7 - 4.0 K/uL   Monocytes Absolute 0.4 0.1 - 1.0 K/uL   Eosinophils Absolute 0.0 0.0 - 0.7 K/uL   Basophils Absolute 0.0 0.0 - 0.1 K/uL   WBC Morphology WHITE COUNT CONFIRMED ON SMEAR     Comment: INCREASED BANDS (>20% BANDS) DIFFERENTIAL CONFIRMED BY SMEAR.    Smear Review PLATELET COUNT CONFIRMED BY SMEAR     Comment: PLATELETS APPEAR DECREASED Performed at [facility], [address], [place], [place] [address]   Blood Culture (routine x 2)     Status: None (Preliminary result)   Collection Time: 08/24/18  3:38 PM  Result Value Ref Range   Specimen Description BLOOD PORTA CATH LEFT CHEST    Special Requests      BOTTLES DRAWN AEROBIC AND ANAEROBIC Blood  Culture adequate volume   Culture      NO GROWTH < 24 HOURS Performed at [facility], [address], [place], [place] [address]    Report Status PENDING   I-Stat CG4 Lactic Acid, ED  (not at  [facility])     Status: None   Collection Time: 08/24/18  3:42 PM  Result Value Ref Range   Lactic Acid, Venous 1.54 0.5 - 1.9 mmol/L  Blood Culture (routine x 2)     Status: None (Preliminary result)   Collection Time: 08/24/18  3:45 PM  Result Value Ref Range   Specimen Description BLOOD LEFT ANTECUBITAL    Special Requests      BOTTLES DRAWN AEROBIC AND ANAEROBIC Blood Culture adequate volume   Culture      NO GROWTH < 24 HOURS Performed at [facility], [address], [place], [place] [address]    Report Status PENDING   I-Stat CG4 Lactic Acid, ED  (not at  [facility])     Status: Abnormal   Collection Time: 08/24/18  6:18 PM  Result Value Ref Range   Lactic Acid, Venous 2.43 (HH) 0.5 - 1.9 mmol/L   Comment NOTIFIED PHYSICIAN   Lactic acid, plasma     Status: None   Collection Time: 08/24/18 10:25 PM  Result Value Ref Range   Lactic Acid, Venous 1.3 0.5 - 1.9 mmol/L    Comment: Performed at [facility], [address], [place], [place] [address]  CBC WITH DIFFERENTIAL     Status: Abnormal   Collection Time: 08/25/18  5:44 AM  Result Value Ref Range   WBC 0.6 (LL) 4.0 - 10.5 K/uL    Comment: WHITE COUNT CONFIRMED ON SMEAR REPEATED TO VERIFY CRITICAL RESULT CALLED TO, READ  BACK BY AND VERIFIED WITH: FUTRELL @ 0800 ON [id number] BY HQENDERSON L    RBC 2.80 (L) 4.22 - 5.81 MIL/uL   Hemoglobin 8.7 (L) 13.0 - 17.0 g/dL   HCT 25.7 (L) 39.0 - 52.0 %   MCV 91.8 78.0 - 100.0 fL   MCH 31.1 26.0 - 34.0 pg   MCHC 33.9 30.0 - 36.0 g/dL   RDW 13.9 11.5 - 15.5 %   Platelets 28 (LL) 150 - 400 K/uL    Comment: REPEATED TO VERIFY CRITICAL RESULT CALLED TO, READ BACK BY AND VERIFIED WITH: FUTRELL @ 0800 ON [id number] BY HENDERSON L SPECIMEN CHECKED FOR CLOTS PLATELET COUNT CONFIRMED BY SMEAR     Neutrophils Relative % TOO FEW TO COUNT, SMEAR AVAILABLE FOR REVIEW %   Band Neutrophils TOO FEW TO COUNT, SMEAR AVAILABLE FOR REVIEW %   Lymphocytes Relative TOO FEW TO COUNT, SMEAR AVAILABLE FOR REVIEW %   Monocytes Relative TOO FEW TO COUNT, SMEAR AVAILABLE FOR REVIEW %   Eosinophils Relative TOO FEW TO COUNT, SMEAR AVAILABLE FOR REVIEW %   Basophils Relative TOO FEW TO COUNT, SMEAR AVAILABLE FOR REVIEW %    Comment: Performed at [facility], [address] [place], [place] [address]  Comprehensive metabolic panel     Status: Abnormal   Collection Time: 08/25/18  5:44 AM  Result Value Ref Range   Sodium 138 135 - 145 mmol/L   Potassium 3.4 (L) 3.5 - 5.1 mmol/L   Chloride 103 98 - 111 mmol/L   CO2 27 22 - 32 mmol/L   Glucose, Bld 105 (H) 70 - 99 mg/dL   BUN 22 8 - 23 mg/dL   Creatinine, Ser 0.72 0.61 - 1.24 mg/dL   Calcium 8.0 (L) 8.9 - 10.3 mg/dL   Total Protein 5.8 (L) 6.5 - 8.1 g/dL   Albumin 2.4 (L) 3.5 - 5.0 g/dL   AST 16 15 - 41 U/L   ALT 20 0 - 44 U/L   Alkaline Phosphatase 44 38 - 126 U/L   Total Bilirubin 1.5 (H) 0.3 - 1.2 mg/dL   GFR calc non Af Amer >60 >60 mL/min   GFR calc Af Amer >60 >60 mL/min    Comment: (NOTE) The eGFR has been calculated using the CKD EPI equation. This calculation has not been validated in all clinical situations. eGFR's persistently <60 mL/min signify possible Chronic Kidney Disease.    Anion gap 8 5 - 15    Comment: Performed at [facility], [address] [place], [place] [address]      RADIOGRAPHY: Dg Chest 2 View  Result Date: 08/24/2018 CLINICAL DATA:  Fever EXAM: CHEST - 2 VIEW COMPARISON:  07/02/2018 FINDINGS: Left-sided central venous port tip over the SVC. No acute airspace disease or effusion. Stable cardiomediastinal silhouette with aortic atherosclerosis. No pneumothorax. Degenerative changes of the spine. IMPRESSION: No active cardiopulmonary disease. Electronically Signed   By: Donavan Foil M.D.   On: 08/24/2018 17:12    Ct Soft Tissue Neck W Contrast  Result Date: 08/24/2018 CLINICAL DATA:  67 y/o M; sore throat, stridor, thick sputum, lethargy, dehydration, weakness. Evaluate for abscess. No neck cancer with chemo and radiation therapy. EXAM: CT NECK WITH CONTRAST TECHNIQUE: Multidetector CT imaging of the neck was performed using the standard protocol following the bolus administration of intravenous contrast. CONTRAST:  92m OMNIPAQUE IOHEXOL 300 MG/ML  SOLN COMPARISON:  05/31/2018 PET-CT FINDINGS: Pharynx and larynx: Decreased soft tissue fullness of left palatine tonsil extending inferiorly along the  glossal tonsillar sulcus at site of prior FDG avid tonsillar cancer. Interval development of smooth mucosal enhancement and low-attenuation mucosal thickening of the oropharynx and hypopharynx compatible with radiation mucositis, left-greater-than-right. No abscess or no exophytic mass identified. Salivary glands: No inflammation, mass, or stone. Thyroid: Normal. Lymph nodes: Left cervical FDG avid lymphadenopathy the level 2, level 3, level 5 levels are now normal in size by imaging criteria. There is fat stranding throughout the anterior and posterior cervical triangles likely representing radiation changes. No new masslike enhancement identified. Vascular: Left port catheter tip extends below the field of view in the SVC. Mild calcific aortic atherosclerosis. Limited intracranial: Negative. Visualized orbits: Negative. Mastoids and visualized paranasal sinuses: Partial opacification of the right mastoid air cells Skeleton: Moderate cervical spondylosis with multilevel disc and facet degenerative changes greatest at the C3-4 and C5-C7 levels. No high-grade bony canal stenosis. Upper chest: Stable left upper lobe 6 mm ground-glass nodule (series 2, image 121), 3 mm nodule in right lung apex (series 2, image 102), and 3 mm juxtapleural nodule of right upper lobe (series 2, image 128). Other: None. IMPRESSION: 1. Interval  development of low-attenuation mucosal thickening and smooth enhancement of the oral and hypopharynx compatible with radiation mucositis. No abscess or new mass identified. 2. Decreased size of left palatine tonsil mass. 3. Left cervical lymphadenopathy identified on prior PET-CT are now normal in size by imaging criteria. No new lymphadenopathy. 4. Stable tiny pulmonary nodules of unlikely significance, attention at follow-up recommended. Electronically Signed   By: Kristine Garbe M.D.   On: 08/24/2018 18:32   US Abdomen Limited Ruq  Result Date: 08/25/2018 CLINICAL DATA:  Increased bilirubin. EXAM: ULTRASOUND ABDOMEN LIMITED RIGHT UPPER QUADRANT COMPARISON:  PET-CT 05/31/2018 FINDINGS: Gallbladder: The gallbladder is incompletely distended and contains multiple calculi, the largest measuring 8 mm. The posterior wall of the gallbladder is obscured by dense shadowing from the present calculi. Wall thickness measures 3.4 mm. The sonographic Murphy's sign is recorded is negative. Common bile duct: Diameter: 3.9 mm Liver: No focal lesion identified. Within normal limits in parenchymal echogenicity. Portal vein is patent on color Doppler imaging with normal direction of blood flow towards the liver. IMPRESSION: Marked cholelithiasis. Equivocal borderline thickening of the gallbladder wall, given the gallbladder is not completely distended. Correlation with HIDA scan or repeated fasting right upper quadrant ultrasound may be considered if found clinically necessary. Normal appearance of the liver. Electronically Signed   By: Fidela Salisbury M.D.   On: 08/25/2018 13:21        ASSESSMENT and plan:   1.  Left tonsillar squamous cell carcinoma: - Received cycle 2 of chemotherapy with carboplatin and 5-FU on 08/10/2018. - Developed radiation induced skin breakdown on the right side of the neck, followed by wound care nurse in the hospital. -I have reviewed CT scan of the neck dated 08/24/2018 which  shows interval development of low-attenuation mucosal thickening and smooth enhancement of oral and hypopharynx compatible with radiation mucositis.  Decrease in size of the left palatine tonsil mass.  Left cervical adenopathy is now normal in size. - He will resume radiation once he is discharged from the hospital.  2.  Severe mucositis: - Receiving Magic mouthwash as needed.  Does not have any pain at this time. -Consider morphine IV if he complains of pain.  3.  Severe neutropenia: -His white count is 0.6.  This is chemotherapy-induced. -I would recommend Neupogen 300 mcg subcu daily until his [place] is more than 1500.  4.  Weight loss: - Patient was started back on Osmolite 1.5 at 55 mL/h, continuous drip.  All questions were answered. The patient knows to call the clinic with any problems, questions or concerns. We can certainly see the patient much sooner if necessary.    [name]

## 2018-08-25 NOTE — Progress Notes (Signed)
PROGRESS NOTE    [name]  WGN:[id number] DOB: [birth year] DOA: 08/24/2018 PCP: Raliegh Ip, DO     Brief Narrative:  67 y.o. male with medical history significant of impaired hearing, hyperlipidemia, PTSD, tonsillar cancer, massive oral cavity with progressive dysphagia, PEG tube placement on May 04, 2018 followed by Dr. Ellin Saba at the cancer center with chemoradiation therapy who is coming to the emergency department due to fever.  He has chronic sore throat and neck discomfort from radiation therapy.  He denies productive cough, emesis, diarrhea, dysuria or frequency.  He gets occasional nausea.  He denies chest pain, palpitations, dizziness, diaphoresis, PND, orthopnea or pitting edema of the lower extremities.  No melena or hematochezia.  No hematuria.  No polyuria, polydipsia, polyphagia or blurred vision.  He has lost a significant amount of weight.   Assessment & Plan: 1-Neutropenic fever (HCC) -Patient with severe mucositis -So far no growth on cultures taken -Continue broad-spectrum antibiotics -Following hemoglobin recommendations will start treatment with Neupogen -Continue supportive care, as needed antipyretics and follow clinical response.  2-Mass of oral cavity -actively receiving radiation and chemotherapy  -continue outpatient follow up with oncology service  -plan is to resume radiation at discharge -some improvement in left tonsillar mass size and size of left cervical adenopathy   3-Hyperlipidemia -not on medical therapy -patient on feeding tube and nutritional supplements  4-severe protein calorie malnutrition -Continue Jevity and pravastatin -Appreciate nutritional service recommendations.  5-pancytopenia (HCC) -Chemotherapy-induced -No signs of overt bleeding -As mentioned above will start treatment with Neupogen -Follow hemoglobin trend and transfuse as needed.  6-Hyponatremia -continue IVF's -follow electrolytes trend -associated  with poor nutrition and dehydration   7-Hyperbilirubinemia -patient denies nausea, vomiting and abd pain -will follow bilirubin trend  -LFT's WNL -bilirubin 1.5 today (trending down)   DVT prophylaxis: SCDs Code Status: Full code Family Communication: No family at bedside. Disposition Plan: Remains inpatient, continue current IV antibiotics, follow culture results, start therapy with Neupogen as recommended by hem-onc.  Consultants:   Oncology service  Procedures:   See below for x-ray reports.  Antimicrobials:  Anti-infectives (From admission, onward)   Start     Dose/Rate Route Frequency Ordered Stop   08/25/18 1000  fluconazole (DIFLUCAN) 40 MG/ML suspension 100 mg     100 mg Per Tube Daily 08/24/18 2202     08/25/18 0600  vancomycin (VANCOCIN) IVPB 750 mg/150 ml premix     750 mg 150 mL/hr over 60 Minutes Intravenous Every 12 hours 08/24/18 2234     08/25/18 0000  ceFEPIme (MAXIPIME) 2 g in sodium chloride 0.9 % 100 mL IVPB     2 g 200 mL/hr over 30 Minutes Intravenous Every 8 hours 08/24/18 2234     08/24/18 1630  ceFEPIme (MAXIPIME) 2 g in sodium chloride 0.9 % 100 mL IVPB     2 g 200 mL/hr over 30 Minutes Intravenous  Once 08/24/18 1625 08/24/18 1823   08/24/18 1630  metroNIDAZOLE (FLAGYL) IVPB 500 mg     500 mg 100 mL/hr over 60 Minutes Intravenous Every 8 hours 08/24/18 1625     08/24/18 1630  vancomycin (VANCOCIN) IVPB 1000 mg/200 mL premix     1,000 mg 200 mL/hr over 60 Minutes Intravenous  Once 08/24/18 1625 08/24/18 1935       Subjective: Temperature in the morning.  Denies chest pain or shortness of breath.  Patient is struggling with oral secretions. WBC neutrophils 0.6  Objective: Vitals:   08/24/18 2130 08/24/18  2231 08/25/18 0528 08/25/18 1245  BP: 111/65 116/61 115/71 (!) 110/59  Pulse: 89 93 92 93  Resp: 20 17 17 18   Temp:  99 F (37.2 C) 98.3 F (36.8 C) 98.5 F (36.9 C)  TempSrc:  Oral Oral Oral  SpO2: 97% 100% 96% 99%  Weight:        Height:        Intake/Output Summary (Last 24 hours) at 08/25/2018 1801 Last data filed at 08/25/2018 1200 Gross per 24 hour  Intake 1226.25 ml  Output 650 ml  Net 576.25 ml   Filed Weights   08/24/18 1504  Weight: 66.7 kg    Examination: General exam: Alert, awake, oriented x 3; patient with hearing impairment.  Also having some difficulty to properly articulate secondary to increased secretions.  Low-grade temperature in a.m.  Denies chest pain and shortness of breath.  Respiratory system: Good air movement bilaterally, positive rhonchi, no wheezing.   Cardiovascular system:RRR. No murmurs, rubs, gallops. Gastrointestinal system: Abdomen is nondistended, soft and nontender. No organomegaly or masses felt. Normal bowel sounds heard.  PEG tube in place. Central nervous system: Alert and oriented. No focal neurological deficits. Extremities: No cyanosis, no clubbing.  No lower extremity edema appreciated on exam. Skin: Radiation induced skin breakdown of the right side of the neck with open wound.  Clean dressings in place, no signs of superimposed infection appreciated. Psychiatry: Judgement and insight appear normal. Mood & affect appropriate.    Data Reviewed: I have personally reviewed following labs and imaging studies  CBC: Recent Labs  Lab 08/24/18 1538 08/25/18 0544  WBC 0.9* 0.6*  NEUTROABS 0.3*  --   HGB 10.2* 8.7*  HCT 30.1* 25.7*  MCV 91.2 91.8  PLT 37* 28*   Basic Metabolic Panel: Recent Labs  Lab 08/24/18 1538 08/25/18 0544  NA 132* 138  K 3.5 3.4*  CL 94* 103  CO2 25 27  GLUCOSE 122* 105*  BUN 25* 22  CREATININE 1.06 0.72  CALCIUM 8.5* 8.0*   GFR: Estimated Creatinine Clearance: 83.4 mL/min (by C-G formula based on SCr of 0.72 mg/dL).   Liver Function Tests: Recent Labs  Lab 08/24/18 1538 08/25/18 0544  AST 20 16  ALT 23 20  ALKPHOS 52 44  BILITOT 1.8* 1.5*  PROT 7.3 5.8*  ALBUMIN 2.9* 2.4*   Urine analysis:    Component Value  Date/Time   COLORURINE AMBER (A) 08/24/2018 1527   APPEARANCEUR CLOUDY (A) 08/24/2018 1527   LABSPEC 1.020 08/24/2018 1527   PHURINE 5.0 08/24/2018 1527   GLUCOSEU NEGATIVE 08/24/2018 1527   HGBUR NEGATIVE 08/24/2018 1527   BILIRUBINUR NEGATIVE 08/24/2018 1527   KETONESUR NEGATIVE 08/24/2018 1527   PROTEINUR 30 (A) 08/24/2018 1527   NITRITE NEGATIVE 08/24/2018 1527   LEUKOCYTESUR NEGATIVE 08/24/2018 1527    Recent Results (from the past 240 hour(s))  Blood Culture (routine x 2)     Status: None (Preliminary result)   Collection Time: 08/24/18  3:38 PM  Result Value Ref Range Status   Specimen Description BLOOD PORTA CATH LEFT CHEST  Final   Special Requests   Final    BOTTLES DRAWN AEROBIC AND ANAEROBIC Blood Culture adequate volume   Culture   Final    NO GROWTH < 24 HOURS Performed at [facility], [address], [place], [place] [address]    Report Status PENDING  Incomplete  Blood Culture (routine x 2)     Status: None (Preliminary result)   Collection Time: 08/24/18  3:45 PM  Result Value Ref Range Status   Specimen Description BLOOD LEFT ANTECUBITAL  Final   Special Requests   Final    BOTTLES DRAWN AEROBIC AND ANAEROBIC Blood Culture adequate volume   Culture   Final    NO GROWTH < 24 HOURS Performed at [facility], [address], [place], [place] [address]    Report Status PENDING  Incomplete     Radiology Studies: Dg Chest 2 View  Result Date: 08/24/2018 CLINICAL DATA:  Fever EXAM: CHEST - 2 VIEW COMPARISON:  07/02/2018 FINDINGS: Left-sided central venous port tip over the SVC. No acute airspace disease or effusion. Stable cardiomediastinal silhouette with aortic atherosclerosis. No pneumothorax. Degenerative changes of the spine. IMPRESSION: No active cardiopulmonary disease. Electronically Signed   By: Jasmine Pang M.D.   On: 08/24/2018 17:12   Ct Soft Tissue Neck W Contrast  Result Date: 08/24/2018 CLINICAL DATA:  67 y/o M; sore throat, stridor, thick sputum,  lethargy, dehydration, weakness. Evaluate for abscess. No neck cancer with chemo and radiation therapy. EXAM: CT NECK WITH CONTRAST TECHNIQUE: Multidetector CT imaging of the neck was performed using the standard protocol following the bolus administration of intravenous contrast. CONTRAST:  75mL OMNIPAQUE IOHEXOL 300 MG/ML  SOLN COMPARISON:  05/31/2018 PET-CT FINDINGS: Pharynx and larynx: Decreased soft tissue fullness of left palatine tonsil extending inferiorly along the glossal tonsillar sulcus at site of prior FDG avid tonsillar cancer. Interval development of smooth mucosal enhancement and low-attenuation mucosal thickening of the oropharynx and hypopharynx compatible with radiation mucositis, left-greater-than-right. No abscess or no exophytic mass identified. Salivary glands: No inflammation, mass, or stone. Thyroid: Normal. Lymph nodes: Left cervical FDG avid lymphadenopathy the level 2, level 3, level 5 levels are now normal in size by imaging criteria. There is fat stranding throughout the anterior and posterior cervical triangles likely representing radiation changes. No new masslike enhancement identified. Vascular: Left port catheter tip extends below the field of view in the SVC. Mild calcific aortic atherosclerosis. Limited intracranial: Negative. Visualized orbits: Negative. Mastoids and visualized paranasal sinuses: Partial opacification of the right mastoid air cells Skeleton: Moderate cervical spondylosis with multilevel disc and facet degenerative changes greatest at the C3-4 and C5-C7 levels. No high-grade bony canal stenosis. Upper chest: Stable left upper lobe 6 mm ground-glass nodule (series 2, image 121), 3 mm nodule in right lung apex (series 2, image 102), and 3 mm juxtapleural nodule of right upper lobe (series 2, image 128). Other: None. IMPRESSION: 1. Interval development of low-attenuation mucosal thickening and smooth enhancement of the oral and hypopharynx compatible with radiation  mucositis. No abscess or new mass identified. 2. Decreased size of left palatine tonsil mass. 3. Left cervical lymphadenopathy identified on prior PET-CT are now normal in size by imaging criteria. No new lymphadenopathy. 4. Stable tiny pulmonary nodules of unlikely significance, attention at follow-up recommended. Electronically Signed   By: Mitzi Hansen M.D.   On: 08/24/2018 18:32   US Abdomen Limited Ruq  Result Date: 08/25/2018 CLINICAL DATA:  Increased bilirubin. EXAM: ULTRASOUND ABDOMEN LIMITED RIGHT UPPER QUADRANT COMPARISON:  PET-CT 05/31/2018 FINDINGS: Gallbladder: The gallbladder is incompletely distended and contains multiple calculi, the largest measuring 8 mm. The posterior wall of the gallbladder is obscured by dense shadowing from the present calculi. Wall thickness measures 3.4 mm. The sonographic Murphy's sign is recorded is negative. Common bile duct: Diameter: 3.9 mm Liver: No focal lesion identified. Within normal limits in parenchymal echogenicity. Portal vein is patent on color Doppler imaging with  normal direction of blood flow towards the liver. IMPRESSION: Marked cholelithiasis. Equivocal borderline thickening of the gallbladder wall, given the gallbladder is not completely distended. Correlation with HIDA scan or repeated fasting right upper quadrant ultrasound may be considered if found clinically necessary. Normal appearance of the liver. Electronically Signed   By: Ted Mcalpine M.D.   On: 08/25/2018 13:21   Scheduled Meds: . feeding supplement (PRO-STAT SUGAR FREE 64)  30 mL Per Tube Daily  . filgrastim (NEUPOGEN)  SQ  300 mcg Subcutaneous q1800  . fluconazole  100 mg Per Tube Daily  . lidocaine  15 mL Mouth/Throat QID  . silver sulfADIAZINE   Topical BID  . sucralfate  1 g Oral TID WC & HS   Continuous Infusions: . 0.9 % NaCl with KCl 20 mEq / L 125 mL/hr at 08/25/18 1022  . ceFEPime (MAXIPIME) IV Stopped (08/25/18 1120)  . feeding supplement (OSMOLITE  1.5 CAL) 1,000 mL (08/25/18 1511)  . metronidazole Stopped (08/25/18 1120)  . vancomycin Stopped (08/25/18 0640)     LOS: 1 day    Time spent: 35 minutes. Greater than 50% of this time was spent in direct contact with the patient, coordinating care and discussing relevant ongoing clinical issues, including need for IV antibiotics, low WBC's level and anticipated treatment for that (neupogen). Review consult note by oncology service and updated nursing staff about plan of care.    Vassie Loll, MD [facility] Pager [phone]  If 7PM-7AM, please contact night-coverage [email or web address] Password [email or web address] 08/25/2018, 6:01 PM

## 2018-08-26 LAB — URINE CULTURE: Special Requests: NORMAL

## 2018-08-26 LAB — CBC WITH DIFFERENTIAL/PLATELET
Basophils Absolute: 0 10*3/uL (ref 0.0–0.1)
Basophils Relative: 0 %
EOS ABS: 0 10*3/uL (ref 0.0–0.7)
EOS PCT: 2 %
HCT: 24.5 % — ABNORMAL LOW (ref 39.0–52.0)
Hemoglobin: 8.4 g/dL — ABNORMAL LOW (ref 13.0–17.0)
LYMPHS ABS: 0.2 10*3/uL — AB (ref 0.7–4.0)
Lymphocytes Relative: 12 %
MCH: 31.6 pg (ref 26.0–34.0)
MCHC: 34.3 g/dL (ref 30.0–36.0)
MCV: 92.1 fL (ref 78.0–100.0)
MONO ABS: 0.3 10*3/uL (ref 0.1–1.0)
Monocytes Relative: 16 %
NEUTROS PCT: 70 %
Neutro Abs: 1.1 10*3/uL — ABNORMAL LOW (ref 1.7–7.7)
PLATELETS: 27 10*3/uL — AB (ref 150–400)
RBC: 2.66 MIL/uL — ABNORMAL LOW (ref 4.22–5.81)
RDW: 14.2 % (ref 11.5–15.5)
WBC: 1.6 10*3/uL — AB (ref 4.0–10.5)

## 2018-08-26 LAB — BASIC METABOLIC PANEL
Anion gap: 7 (ref 5–15)
BUN: 17 mg/dL (ref 8–23)
CALCIUM: 7.4 mg/dL — AB (ref 8.9–10.3)
CO2: 26 mmol/L (ref 22–32)
Chloride: 107 mmol/L (ref 98–111)
Creatinine, Ser: 0.65 mg/dL (ref 0.61–1.24)
GFR calc Af Amer: >60 mL/min (ref >60)
GLUCOSE: 142 mg/dL — AB (ref 70–99)
Potassium: 3.3 mmol/L — ABNORMAL LOW (ref 3.5–5.1)
SODIUM: 140 mmol/L (ref 135–145)

## 2018-08-26 LAB — GLUCOSE, CAPILLARY
GLUCOSE-CAPILLARY: 113 mg/dL — AB (ref 70–99)
GLUCOSE-CAPILLARY: 117 mg/dL — AB (ref 70–99)
GLUCOSE-CAPILLARY: 120 mg/dL — AB (ref 70–99)
Glucose-Capillary: 116 mg/dL — ABNORMAL HIGH (ref 70–99)
Glucose-Capillary: 122 mg/dL — ABNORMAL HIGH (ref 70–99)
Glucose-Capillary: 144 mg/dL — ABNORMAL HIGH (ref 70–99)

## 2018-08-26 LAB — HIV ANTIBODY (ROUTINE TESTING W REFLEX): HIV Screen 4th Generation wRfx: NONREACTIVE

## 2018-08-26 NOTE — Progress Notes (Signed)
CBG 122 

## 2018-08-26 NOTE — Progress Notes (Signed)
CRITICAL VALUE ALERT  Critical Value: Platelet 27  Date & Time Notied:  08/26/18, 6286  Provider Notified: [place]  Orders Received/Actions taken:

## 2018-08-26 NOTE — Care Management Note (Signed)
Case Management Note  Patient Details  Name: [name] MRN: [id number] Date of Birth: [birth year]  Subjective/Objective:     Admitted with neutropenic fever. Pt from home, lives alone, undergoing treatment for  Tonsillar cancer. Pt has PEG tube and severe difficulty swallowing d/t radiation to neck. Pt is active with [facility] for HH and TF's. Vaughan Basta, [facility] rep, aware of admission. Pt will need suction at DC and resumption of [place] services. Pt is aware HH has 48 hrs to resume services when he discharges.                Action/Plan: [facility] rep, aware of referral for suction. Pt will need order to resume [place] services. CM will notify [facility] when pt discharges.   Expected Discharge Date:     08/27/18             Expected Discharge Plan:  [place]  In-House Referral:  NA  Discharge planning Services  CM Consult  Post Acute Care Choice:  Durable Medical Equipment, Home Health, Resumption of Svcs/PTA Provider Choice offered to:  Patient  DME Arranged:  Suction DME Agency:  [place] Arranged:  RN, PT [facility] Agency:  [place]  Status of Service:  Completed, signed off  If discussed at [place] of Stay Meetings, dates discussed:    Additional Comments:  Sherald Barge, RN 08/26/2018, 11:29 AM

## 2018-08-26 NOTE — Progress Notes (Signed)
PROGRESS NOTE    [name]  ZOX:[id number] DOB: January 11, 1951 DOA: 08/24/2018 PCP: Raliegh Ip, DO     Brief Narrative:  67 y.o. male with medical history significant of impaired hearing, hyperlipidemia, PTSD, tonsillar cancer, massive oral cavity with progressive dysphagia, PEG tube placement on May 04, 2018 followed by Dr. Ellin Saba at the cancer center with chemoradiation therapy who is coming to the emergency department due to fever.  He has chronic sore throat and neck discomfort from radiation therapy.  He denies productive cough, emesis, diarrhea, dysuria or frequency.  He gets occasional nausea.  He denies chest pain, palpitations, dizziness, diaphoresis, PND, orthopnea or pitting edema of the lower extremities.  No melena or hematochezia.  No hematuria.  No polyuria, polydipsia, polyphagia or blurred vision.  He has lost a significant amount of weight.   Assessment & Plan: 1-Neutropenic fever (HCC) -Patient with severe mucositis -So far no growth on cultures taken -Continue broad-spectrum antibiotics -Following hemo/Onc rec's continue Neupogen -currently afebrile.  -Continue supportive care, as needed antipyretics and follow clinical response.  2-Mass of oral cavity -actively receiving radiation and chemotherapy  -continue outpatient follow up with oncology service  -plan is to resume radiation at discharge -some improvement in left tonsillar mass size and size of left cervical adenopathy   3-Hyperlipidemia -not on medical therapy -patient on feeding tube and nutritional supplements only  4-severe protein calorie malnutrition -Continue Jevity and prostat  -Appreciate nutritional service recommendations.  5-pancytopenia (HCC) -Chemotherapy-induced -No signs of overt bleeding -As mentioned above will start treatment with Neupogen -Follow hemoglobin trend and transfuse as needed.  6-Hyponatremia -continue IVF's -follow electrolytes trend -associated with  poor nutrition and dehydration   7-Hyperbilirubinemia -patient denies nausea, vomiting and abd pain -will follow bilirubin trend  -LFT's WNL -bilirubin continued trending down   DVT prophylaxis: SCDs Code Status: Full code Family Communication: No family at bedside. Disposition Plan: Remains inpatient, continue current IV antibiotics, follow culture results, continue Neupogen.  If afebrile for 48 hours will transition antibiotics to by mouth regimen and the patient will be able to be discharged home.  Consultants:   Oncology service  Procedures:   See below for x-ray reports.  Antimicrobials:  Anti-infectives (From admission, onward)   Start     Dose/Rate Route Frequency Ordered Stop   08/25/18 1000  fluconazole (DIFLUCAN) 40 MG/ML suspension 100 mg     100 mg Per Tube Daily 08/24/18 2202     08/25/18 0600  vancomycin (VANCOCIN) IVPB 750 mg/150 ml premix     750 mg 150 mL/hr over 60 Minutes Intravenous Every 12 hours 08/24/18 2234     08/25/18 0000  ceFEPIme (MAXIPIME) 2 g in sodium chloride 0.9 % 100 mL IVPB     2 g 200 mL/hr over 30 Minutes Intravenous Every 8 hours 08/24/18 2234     08/24/18 1630  ceFEPIme (MAXIPIME) 2 g in sodium chloride 0.9 % 100 mL IVPB     2 g 200 mL/hr over 30 Minutes Intravenous  Once 08/24/18 1625 08/24/18 1823   08/24/18 1630  metroNIDAZOLE (FLAGYL) IVPB 500 mg     500 mg 100 mL/hr over 60 Minutes Intravenous Every 8 hours 08/24/18 1625     08/24/18 1630  vancomycin (VANCOCIN) IVPB 1000 mg/200 mL premix     1,000 mg 200 mL/hr over 60 Minutes Intravenous  Once 08/24/18 1625 08/24/18 1935       Subjective: No fever currently, denies chest pain or shortness of breath.  Continues  to have significant amount of oral secretions and intermittent episode of productive coughing spells.  No nausea or vomiting.  Denies abdominal pain  Objective: Vitals:   08/25/18 2123 08/25/18 2309 08/26/18 0700 08/26/18 1300  BP: (!) 107/58  100/60 112/64    Pulse:   90 93  Resp: 18  17 18   Temp: 98.4 F (36.9 C)  99 F (37.2 C) 99 F (37.2 C)  TempSrc: Axillary  Axillary Axillary  SpO2: 91% 93% 95% 97%  Weight:   73.3 kg   Height:        Intake/Output Summary (Last 24 hours) at 08/26/2018 1729 Last data filed at 08/26/2018 1200 Gross per 24 hour  Intake 4821.59 ml  Output 1600 ml  Net 3221.59 ml   Filed Weights   08/24/18 1504 08/26/18 0700  Weight: 66.7 kg 73.3 kg    Examination: General exam: Alert, awake, oriented x 3; still having a lot of secretions and intermittent episode of productive cough.  Currently afebrile and overall feeling better.  Denies chest pain, no nausea, no vomiting, no shortness of breath. Respiratory system: Positive rhonchi, no wheezing, good air movement bilaterally, no using accessory muscles.  Cardiovascular system:RRR. No murmurs, rubs, gallops. Gastrointestinal system: Abdomen is nondistended, soft and nontender. No organomegaly or masses felt. Normal bowel sounds heard.  PEG tube in place (no signs of superimposed infection). Central nervous system: Alert and oriented. No focal neurological deficits. Extremities: No C/C/E, +pedal pulses Skin: No petechiae, radiation-induced skin breakdown on the right side of his neck with open wound, clean dressings in place; no drainage. Psychiatry: Judgement and insight appear normal. Mood & affect appropriate.   Data Reviewed: I have personally reviewed following labs and imaging studies  CBC: Recent Labs  Lab 08/24/18 1538 08/25/18 0544 08/26/18 0618  WBC 0.9* 0.6* 1.6*  NEUTROABS 0.3*  --  1.1*  HGB 10.2* 8.7* 8.4*  HCT 30.1* 25.7* 24.5*  MCV 91.2 91.8 92.1  PLT 37* 28* 27*   Basic Metabolic Panel: Recent Labs  Lab 08/24/18 1538 08/25/18 0544 08/26/18 0618  NA 132* 138 140  K 3.5 3.4* 3.3*  CL 94* 103 107  CO2 25 27 26   GLUCOSE 122* 105* 142*  BUN 25* 22 17  CREATININE 1.06 0.72 0.65  CALCIUM 8.5* 8.0* 7.4*   GFR: Estimated Creatinine  Clearance: 85.5 mL/min (by C-G formula based on SCr of 0.65 mg/dL).   Liver Function Tests: Recent Labs  Lab 08/24/18 1538 08/25/18 0544  AST 20 16  ALT 23 20  ALKPHOS 52 44  BILITOT 1.8* 1.5*  PROT 7.3 5.8*  ALBUMIN 2.9* 2.4*   Urine analysis:    Component Value Date/Time   COLORURINE AMBER (A) 08/24/2018 1527   APPEARANCEUR CLOUDY (A) 08/24/2018 1527   LABSPEC 1.020 08/24/2018 1527   PHURINE 5.0 08/24/2018 1527   GLUCOSEU NEGATIVE 08/24/2018 1527   HGBUR NEGATIVE 08/24/2018 1527   BILIRUBINUR NEGATIVE 08/24/2018 1527   KETONESUR NEGATIVE 08/24/2018 1527   PROTEINUR 30 (A) 08/24/2018 1527   NITRITE NEGATIVE 08/24/2018 1527   LEUKOCYTESUR NEGATIVE 08/24/2018 1527    Recent Results (from the past 240 hour(s))  Urine culture     Status: Abnormal   Collection Time: 08/24/18  3:27 PM  Result Value Ref Range Status   Specimen Description   Final    URINE, CLEAN CATCH Performed at [facility], [address] [place], [place] [address]    Special Requests   Final    Normal Performed at  [facility], [address], [place], [place] [address]    Culture (A)  Final    <10,000 COLONIES/mL INSIGNIFICANT GROWTH Performed at [facility], [address], [place], [place] [address]    Report Status 08/26/2018 FINAL  Final  Blood Culture (routine x 2)     Status: None (Preliminary result)   Collection Time: 08/24/18  3:38 PM  Result Value Ref Range Status   Specimen Description BLOOD PORTA CATH LEFT CHEST  Final   Special Requests   Final    BOTTLES DRAWN AEROBIC AND ANAEROBIC Blood Culture adequate volume   Culture   Final    NO GROWTH 2 DAYS Performed at [facility], [address], [place], [place] [address]    Report Status PENDING  Incomplete  Blood Culture (routine x 2)     Status: None (Preliminary result)   Collection Time: 08/24/18  3:45 PM  Result Value Ref Range Status   Specimen Description BLOOD LEFT ANTECUBITAL  Final   Special Requests   Final     BOTTLES DRAWN AEROBIC AND ANAEROBIC Blood Culture adequate volume   Culture   Final    NO GROWTH 2 DAYS Performed at [facility], [address], [place], [place] [address]    Report Status PENDING  Incomplete     Radiology Studies: Ct Soft Tissue Neck W Contrast  Result Date: 08/24/2018 CLINICAL DATA:  67 y/o M; sore throat, stridor, thick sputum, lethargy, dehydration, weakness. Evaluate for abscess. No neck cancer with chemo and radiation therapy. EXAM: CT NECK WITH CONTRAST TECHNIQUE: Multidetector CT imaging of the neck was performed using the standard protocol following the bolus administration of intravenous contrast. CONTRAST:  75mL OMNIPAQUE IOHEXOL 300 MG/ML  SOLN COMPARISON:  05/31/2018 PET-CT FINDINGS: Pharynx and larynx: Decreased soft tissue fullness of left palatine tonsil extending inferiorly along the glossal tonsillar sulcus at site of prior FDG avid tonsillar cancer. Interval development of smooth mucosal enhancement and low-attenuation mucosal thickening of the oropharynx and hypopharynx compatible with radiation mucositis, left-greater-than-right. No abscess or no exophytic mass identified. Salivary glands: No inflammation, mass, or stone. Thyroid: Normal. Lymph nodes: Left cervical FDG avid lymphadenopathy the level 2, level 3, level 5 levels are now normal in size by imaging criteria. There is fat stranding throughout the anterior and posterior cervical triangles likely representing radiation changes. No new masslike enhancement identified. Vascular: Left port catheter tip extends below the field of view in the SVC. Mild calcific aortic atherosclerosis. Limited intracranial: Negative. Visualized orbits: Negative. Mastoids and visualized paranasal sinuses: Partial opacification of the right mastoid air cells Skeleton: Moderate cervical spondylosis with multilevel disc and facet degenerative changes greatest at the C3-4 and C5-C7 levels. No high-grade bony canal stenosis. Upper chest:  Stable left upper lobe 6 mm ground-glass nodule (series 2, image 121), 3 mm nodule in right lung apex (series 2, image 102), and 3 mm juxtapleural nodule of right upper lobe (series 2, image 128). Other: None. IMPRESSION: 1. Interval development of low-attenuation mucosal thickening and smooth enhancement of the oral and hypopharynx compatible with radiation mucositis. No abscess or new mass identified. 2. Decreased size of left palatine tonsil mass. 3. Left cervical lymphadenopathy identified on prior PET-CT are now normal in size by imaging criteria. No new lymphadenopathy. 4. Stable tiny pulmonary nodules of unlikely significance, attention at follow-up recommended. Electronically Signed   By: Mitzi Hansen M.D.   On: 08/24/2018 18:32   US Abdomen Limited Ruq  Result Date: 08/25/2018 CLINICAL  DATA:  Increased bilirubin. EXAM: ULTRASOUND ABDOMEN LIMITED RIGHT UPPER QUADRANT COMPARISON:  PET-CT 05/31/2018 FINDINGS: Gallbladder: The gallbladder is incompletely distended and contains multiple calculi, the largest measuring 8 mm. The posterior wall of the gallbladder is obscured by dense shadowing from the present calculi. Wall thickness measures 3.4 mm. The sonographic Murphy's sign is recorded is negative. Common bile duct: Diameter: 3.9 mm Liver: No focal lesion identified. Within normal limits in parenchymal echogenicity. Portal vein is patent on color Doppler imaging with normal direction of blood flow towards the liver. IMPRESSION: Marked cholelithiasis. Equivocal borderline thickening of the gallbladder wall, given the gallbladder is not completely distended. Correlation with HIDA scan or repeated fasting right upper quadrant ultrasound may be considered if found clinically necessary. Normal appearance of the liver. Electronically Signed   By: Ted Mcalpine M.D.   On: 08/25/2018 13:21   Scheduled Meds: . feeding supplement (PRO-STAT SUGAR FREE 64)  30 mL Per Tube Daily  . filgrastim  (NEUPOGEN)  SQ  300 mcg Subcutaneous q1800  . fluconazole  100 mg Per Tube Daily  . lidocaine  15 mL Mouth/Throat QID  . silver sulfADIAZINE   Topical BID  . sucralfate  1 g Oral TID WC & HS   Continuous Infusions: . 0.9 % NaCl with KCl 20 mEq / L 125 mL/hr at 08/26/18 0259  . ceFEPime (MAXIPIME) IV Stopped (08/26/18 1227)  . feeding supplement (OSMOLITE 1.5 CAL) 1,000 mL (08/26/18 1042)  . metronidazole Stopped (08/26/18 8119)  . vancomycin Stopped (08/26/18 0703)     LOS: 2 days    Time spent: 30 minutes   Vassie Loll, MD [facility] Pager [phone]  If 7PM-7AM, please contact night-coverage [email or web address] Password [email or web address] 08/26/2018, 5:29 PM

## 2018-08-27 ENCOUNTER — Other Ambulatory Visit (HOSPITAL_COMMUNITY): Payer: Self-pay | Admitting: *Deleted

## 2018-08-27 ENCOUNTER — Encounter (HOSPITAL_COMMUNITY): Payer: Self-pay | Admitting: *Deleted

## 2018-08-27 LAB — BASIC METABOLIC PANEL
Anion gap: 6 (ref 5–15)
BUN: 12 mg/dL (ref 8–23)
CALCIUM: 7.3 mg/dL — AB (ref 8.9–10.3)
CO2: 26 mmol/L (ref 22–32)
Chloride: 106 mmol/L (ref 98–111)
Creatinine, Ser: 0.62 mg/dL (ref 0.61–1.24)
GFR calc Af Amer: >60 mL/min (ref >60)
Glucose, Bld: 147 mg/dL — ABNORMAL HIGH (ref 70–99)
Potassium: 3.2 mmol/L — ABNORMAL LOW (ref 3.5–5.1)
Sodium: 138 mmol/L (ref 135–145)

## 2018-08-27 LAB — CBC WITH DIFFERENTIAL/PLATELET
BASOS PCT: 1 %
Basophils Absolute: 0 10*3/uL (ref 0.0–0.1)
EOS PCT: 1 %
Eosinophils Absolute: 0 10*3/uL (ref 0.0–0.7)
HCT: 25.2 % — ABNORMAL LOW (ref 39.0–52.0)
Hemoglobin: 8.4 g/dL — ABNORMAL LOW (ref 13.0–17.0)
LYMPHS ABS: 0.3 10*3/uL — AB (ref 0.7–4.0)
Lymphocytes Relative: 8 %
MCH: 31.2 pg (ref 26.0–34.0)
MCHC: 33.3 g/dL (ref 30.0–36.0)
MCV: 93.7 fL (ref 78.0–100.0)
Monocytes Absolute: 0.2 10*3/uL (ref 0.1–1.0)
Monocytes Relative: 5 %
Neutro Abs: 3 10*3/uL (ref 1.7–7.7)
Neutrophils Relative %: 85 %
PLATELETS: 27 10*3/uL — AB (ref 150–400)
RBC: 2.69 MIL/uL — ABNORMAL LOW (ref 4.22–5.81)
RDW: 14.6 % (ref 11.5–15.5)
WBC: 3.5 10*3/uL — ABNORMAL LOW (ref 4.0–10.5)

## 2018-08-27 LAB — GLUCOSE, CAPILLARY
GLUCOSE-CAPILLARY: 101 mg/dL — AB (ref 70–99)
GLUCOSE-CAPILLARY: 125 mg/dL — AB (ref 70–99)
Glucose-Capillary: 117 mg/dL — ABNORMAL HIGH (ref 70–99)
Glucose-Capillary: 120 mg/dL — ABNORMAL HIGH (ref 70–99)
Glucose-Capillary: 133 mg/dL — ABNORMAL HIGH (ref 70–99)
Glucose-Capillary: 136 mg/dL — ABNORMAL HIGH (ref 70–99)

## 2018-08-27 LAB — MAGNESIUM: Magnesium: 1.8 mg/dL (ref 1.7–2.4)

## 2018-08-27 LAB — VANCOMYCIN, TROUGH: Vancomycin Tr: 8 ug/mL — ABNORMAL LOW (ref 15–20)

## 2018-08-27 MED ORDER — POTASSIUM CHLORIDE 20 MEQ/15ML (10%) PO SOLN
40.0000 meq | Freq: Every day | ORAL | Status: DC
  Administered 2018-08-27 – 2018-08-29 (×3): 40 meq via ORAL
  Filled 2018-08-27 (×3): qty 30

## 2018-08-27 NOTE — Progress Notes (Signed)
PROGRESS NOTE    [name]  IEP:[id number] DOB: 08/22/1951 DOA: 08/24/2018 PCP: Raliegh Ip, DO     Brief Narrative:  67 y.o. male with medical history significant of impaired hearing, hyperlipidemia, PTSD, tonsillar cancer, massive oral cavity with progressive dysphagia, PEG tube placement on May 04, 2018 followed by Dr. Ellin Saba at the cancer center with chemoradiation therapy who is coming to the emergency department due to fever.  He has chronic sore throat and neck discomfort from radiation therapy.  He denies productive cough, emesis, diarrhea, dysuria or frequency.  He gets occasional nausea.  He denies chest pain, palpitations, dizziness, diaphoresis, PND, orthopnea or pitting edema of the lower extremities.  No melena or hematochezia.  No hematuria.  No polyuria, polydipsia, polyphagia or blurred vision.  He has lost a significant amount of weight.   Assessment & Plan: 1-Neutropenic fever (HCC) -Patient with severe mucositis and a high risk for aspiration PNA. -So far no growth on cultures taken -Continue broad-spectrum antibiotics -WBC's up and with absolute neutrophils of 3.5; Neupogen will be discontinued. -patient spike fever overnight. -Continue supportive care, as needed antipyretics and follow clinical response.  2-Mass of oral cavity -actively receiving radiation and chemotherapy  -continue outpatient follow up with oncology service  -plan is to resume radiation at discharge -some improvement in left tonsillar mass size and size of left cervical adenopathy   3-Hyperlipidemia -not on medical therapy at this time. -patient on feeding tube and nutritional supplements only  4-severe protein calorie malnutrition -Continue Jevity and prostat  -Appreciate nutritional service recommendations.  5-pancytopenia (HCC) -Chemotherapy-induced -No signs of overt bleeding -Patient had received Neupogen and WBCs up to 3.5 with absolute neutrophils of 3.0 -Follow  hemoglobin trend and transfuse as needed.  6-Hyponatremia -continue IVF's; but adjust rate. -follow electrolytes trend -Urine within normal limits now.  7-Hyperbilirubinemia -patient denies nausea, vomiting and abd pain -will follow bilirubin trend intermittently. -LFT's WNL  8-hypokalemia: -Will replete and follow electrolytes trend. -Most likely associated with overall poor nutrition   DVT prophylaxis: SCDs Code Status: Full code Family Communication: No family at bedside. Disposition Plan: Remains inpatient, continue current IV antibiotics, follow culture results, follow clinical response.  Patient spiked fever (low-grade temperature overnight) and I will prevent him to be discharged.   Consultants:   Oncology service  Procedures:   See below for x-ray reports.  Antimicrobials:  Anti-infectives (From admission, onward)   Start     Dose/Rate Route Frequency Ordered Stop   08/25/18 1000  fluconazole (DIFLUCAN) 40 MG/ML suspension 100 mg     100 mg Per Tube Daily 08/24/18 2202     08/25/18 0600  vancomycin (VANCOCIN) IVPB 750 mg/150 ml premix     750 mg 150 mL/hr over 60 Minutes Intravenous Every 12 hours 08/24/18 2234     08/25/18 0000  ceFEPIme (MAXIPIME) 2 g in sodium chloride 0.9 % 100 mL IVPB     2 g 200 mL/hr over 30 Minutes Intravenous Every 8 hours 08/24/18 2234     08/24/18 1630  ceFEPIme (MAXIPIME) 2 g in sodium chloride 0.9 % 100 mL IVPB     2 g 200 mL/hr over 30 Minutes Intravenous  Once 08/24/18 1625 08/24/18 1823   08/24/18 1630  metroNIDAZOLE (FLAGYL) IVPB 500 mg     500 mg 100 mL/hr over 60 Minutes Intravenous Every 8 hours 08/24/18 1625     08/24/18 1630  vancomycin (VANCOCIN) IVPB 1000 mg/200 mL premix     1,000 mg 200  mL/hr over 60 Minutes Intravenous  Once 08/24/18 1625 08/24/18 1935       Subjective: Feeling weak and tired.  No chest pain, no shortness of breath.  Patient spiked low-grade temperature overnight.  Objective: Vitals:    08/26/18 1300 08/26/18 1956 08/26/18 2154 08/27/18 0651  BP: 112/64  (!) 122/51 111/64  Pulse: 93  97 90  Resp: 18     Temp: 99 F (37.2 C)  (!) 100.6 F (38.1 C) 99.9 F (37.7 C)  TempSrc: Axillary  Oral Oral  SpO2: 97% (!) 88% 96% 98%  Weight:    75.2 kg  Height:        Intake/Output Summary (Last 24 hours) at 08/27/2018 1355 Last data filed at 08/27/2018 0900 Gross per 24 hour  Intake 0 ml  Output 850 ml  Net -850 ml   Filed Weights   08/24/18 1504 08/26/18 0700 08/27/18 0651  Weight: 66.7 kg 73.3 kg 75.2 kg    Examination: General exam: Alert, awake, oriented x 3.  Patient spiked fever (low-grade temp overnight); feeling weak and tired.  Denies chest pain or shortness of breath.  No nausea, no vomiting, no abdominal pain. Respiratory system: Positive rhonchi, no wheezing; good air movement bilaterally.  No using accessory muscles and no requiring oxygen supplementation. Cardiovascular system:RRR. No murmurs, rubs, gallops. Gastrointestinal system: Abdomen is nondistended, soft and nontender. No organomegaly or masses felt. Normal bowel sounds heard.  PEG tube in place (no signs of superimposed infection). Central nervous system: Alert and oriented. No focal neurological deficits. Extremities: No C/C/E, +pedal pulses Skin: No rashes, lesions or ulcers Psychiatry: Judgement and insight appear normal. Mood & affect appropriate.    Data Reviewed: I have personally reviewed following labs and imaging studies  CBC: Recent Labs  Lab 08/24/18 1538 08/25/18 0544 08/26/18 0618 08/27/18 0500  WBC 0.9* 0.6* 1.6* 3.5*  NEUTROABS 0.3*  --  1.1* 3.0  HGB 10.2* 8.7* 8.4* 8.4*  HCT 30.1* 25.7* 24.5* 25.2*  MCV 91.2 91.8 92.1 93.7  PLT 37* 28* 27* 27*   Basic Metabolic Panel: Recent Labs  Lab 08/24/18 1538 08/25/18 0544 08/26/18 0618 08/27/18 0500  NA 132* 138 140 138  K 3.5 3.4* 3.3* 3.2*  CL 94* 103 107 106  CO2 25 27 26 26   GLUCOSE 122* 105* 142* 147*  BUN 25* 22 17  12   CREATININE 1.06 0.72 0.65 0.62  CALCIUM 8.5* 8.0* 7.4* 7.3*  MG  --   --   --  1.8   GFR: Estimated Creatinine Clearance: 85.5 mL/min (by C-G formula based on SCr of 0.62 mg/dL).   Liver Function Tests: Recent Labs  Lab 08/24/18 1538 08/25/18 0544  AST 20 16  ALT 23 20  ALKPHOS 52 44  BILITOT 1.8* 1.5*  PROT 7.3 5.8*  ALBUMIN 2.9* 2.4*   Urine analysis:    Component Value Date/Time   COLORURINE AMBER (A) 08/24/2018 1527   APPEARANCEUR CLOUDY (A) 08/24/2018 1527   LABSPEC 1.020 08/24/2018 1527   PHURINE 5.0 08/24/2018 1527   GLUCOSEU NEGATIVE 08/24/2018 1527   HGBUR NEGATIVE 08/24/2018 1527   BILIRUBINUR NEGATIVE 08/24/2018 1527   KETONESUR NEGATIVE 08/24/2018 1527   PROTEINUR 30 (A) 08/24/2018 1527   NITRITE NEGATIVE 08/24/2018 1527   LEUKOCYTESUR NEGATIVE 08/24/2018 1527    Recent Results (from the past 240 hour(s))  Urine culture     Status: Abnormal   Collection Time: 08/24/18  3:27 PM  Result Value Ref Range Status   Specimen Description  Final    URINE, CLEAN CATCH Performed at [facility], [address], [place], [place] [address]    Special Requests   Final    Normal Performed at [facility], [address] [place], [place] [address]    Culture (A)  Final    <10,000 COLONIES/mL INSIGNIFICANT GROWTH Performed at [facility], [address] [place], [place] [address]    Report Status 08/26/2018 FINAL  Final  Blood Culture (routine x 2)     Status: None (Preliminary result)   Collection Time: 08/24/18  3:38 PM  Result Value Ref Range Status   Specimen Description BLOOD PORTA CATH LEFT CHEST  Final   Special Requests   Final    BOTTLES DRAWN AEROBIC AND ANAEROBIC Blood Culture adequate volume   Culture   Final    NO GROWTH 3 DAYS Performed at [facility], [address], [place], [place] [address]    Report Status PENDING  Incomplete  Blood Culture (routine x 2)     Status: None (Preliminary result)   Collection Time: 08/24/18  3:45  PM  Result Value Ref Range Status   Specimen Description BLOOD LEFT ANTECUBITAL  Final   Special Requests   Final    BOTTLES DRAWN AEROBIC AND ANAEROBIC Blood Culture adequate volume   Culture   Final    NO GROWTH 3 DAYS Performed at [facility], [address] [place], [place] [address]    Report Status PENDING  Incomplete     Radiology Studies: No results found. Scheduled Meds: . feeding supplement (PRO-STAT SUGAR FREE 64)  30 mL Per Tube Daily  . fluconazole  100 mg Per Tube Daily  . lidocaine  15 mL Mouth/Throat QID  . potassium chloride  40 mEq Oral Daily  . silver sulfADIAZINE   Topical BID  . sucralfate  1 g Oral TID WC & HS   Continuous Infusions: . 0.9 % NaCl with KCl 20 mEq / L 75 mL/hr at 08/27/18 0932  . ceFEPime (MAXIPIME) IV 2 g (08/27/18 1308)  . feeding supplement (OSMOLITE 1.5 CAL) 1,000 mL (08/27/18 0640)  . metronidazole Stopped (08/27/18 1044)  . vancomycin Stopped (08/27/18 2952)     LOS: 3 days    Time spent: 30 minutes   Vassie Loll, MD [facility] Pager [phone]  If 7PM-7AM, please contact night-coverage [email or web address] Password [email or web address] 08/27/2018, 1:55 PM

## 2018-08-27 NOTE — Progress Notes (Signed)
CRITICAL VALUE ALERT  Critical Value:  Platelets 27  Date & Time Notied:  08/27/18 7948  Provider Notified: Dr. Myna Hidalgo  Orders Received/Actions taken: Monitor

## 2018-08-27 NOTE — Progress Notes (Signed)
Dressing to neck cleaned with soap and water by patient. Silvadene cream and nonadherent pads applied and wrapped with Kerlex. Patient tolerated well. Requests to clean the area himself for comfort. Will continue to monitor.

## 2018-08-27 NOTE — Progress Notes (Signed)
Pharmacy Antibiotic Note  [name] is a 67 y.o. male admitted on 08/24/2018 with Febrile Neutropenia.  Pharmacy has been consulted for Vancomycin and Cefepime dosing.  Plan: Vancomycin 750mg  IV every 12 hours.  Goal trough 15-20 mcg/mL.  Vanco trough 9/6 1730 Cefepime 2gm IV every 8 hours. Monitor labs, micro and vitals.   Height: 5\' 8"  (172.7 cm) Weight: 165 lb 12.6 oz (75.2 kg) IBW/kg (Calculated) : 68.4  Temp (24hrs), Avg:99.8 F (37.7 C), Min:99 F (37.2 C), Max:100.6 F (38.1 C)  Recent Labs  Lab 08/24/18 1538 08/24/18 1542 08/24/18 1818 08/24/18 2225 08/25/18 0544 08/26/18 0618 08/27/18 0500  WBC 0.9*  --   --   --  0.6* 1.6* 3.5*  CREATININE 1.06  --   --   --  0.72 0.65 0.62  LATICACIDVEN  --  1.54 2.43* 1.3  --   --   --     Estimated Creatinine Clearance: 85.5 mL/min (by C-G formula based on SCr of 0.62 mg/dL).    No Known Allergies  Antimicrobials this admission: Vanc 9/3 >>  Cefepime 9/3 >>  Flagyl 9/3 >>  Dose adjustments this admission: n/a   Microbiology results: 9/3 BCx: ngtd 9/3 UCx: insignificant growth     Thank you for allowing pharmacy to be a part of this patient's care.  Ramond Craver 08/27/2018 11:13 AM

## 2018-08-27 NOTE — Progress Notes (Signed)
Initial Nutrition Assessment  DOCUMENTATION CODES:  Severe malnutrition in context of acute illness/injury  INTERVENTION:  Continue Osmolite 1.5 via PEG at goal rate of 55 ml/h & Prostat 30 ml q24 to provide 2080 kcals, 98 gm protein, 1006 ml free water daily.  If IVF stopped, recommend beginning 300cc ml fluid boluses q6 hrs for additional 1.2 L fluid   Spoke with [facility] regarding home TF supplies. Gave pt number to call if TF not available on return home.   He says he will not immediately take oral intake. D/C orders/Home TF regimen will be unchanged: 6 cans of osmolite 1.5 w/ 60 cc flush before and after and 240 cc free water bolus TID.   Provides 2130 kcals, 89g Pro, 1086 mls + 360 cc from flushes and 720 from fluid boluses.   NUTRITION DIAGNOSIS:  Severe Malnutrition (acute) related to cancer and cancer related treatments as evidenced by loss of >5% bw in 1 week and moderate acute loss of fat depletions  -Resolving with continuous TF  GOAL:  Patient will meet greater than or equal to 90% of their needs  MET with continuous TF  MONITOR:  Labs, Diet advancement, Weight trends, I & O's, TF tolerance  ASSESSMENT:  67 y/o male PMHx HLD, HOH, CKD, w/ little recent medical follow up. Presented to [facility] 6/12 after referred by local ENT MD for large L tonsillar mass s/p biopsy. + for SCC. At time of presentation, had dysphagia and 5 lb loss x1 month. Begun radiation 7/22 and chemo 7/23. S/P peg placement 8/14 and start of Tube feeding 8/16. Presented to ED 9/3 d/t fever. Admitted due to neutropenic fever.   Following up with pt regarding tube feeding. Clinically, he appears significantly better today. He is smiling. His secretions are much reduced. He is still using suction and is also now using a large spray bottle to moisten his mouth. He reports his pain is improved. He is able to to be understood now.   He sounds to be tolerating the tube feeding well. He says his bottom is sore, but  this is not from diarrhea, rather from "sitting in bed so long". He is greatly hoping to be discharged tomorrow.   RD asked  if he thought he would attempt oral intake. He says he will not resume oral intake immediately. RD emphasized importance of compliance with full Home TF regimen:   6 cans of osmolite 1.5 w/ 60 cc flush before and after and 240 cc free water bolus TID.   This is providing: 2130 kcals, 89g Pro, 1086 mls + 360 cc from flushes and 720 from fluid boluses.   Patient had been scheduled to receive shipment of tube feeding on the day he was admitted. Unlikely he received this. RD asked if he had sufficient TF formula, and he is unsure, but doesn't think so. RD contacted [facility] nutrition team. Rep says the shipment was left at pt's door on 9/4. RD returned and provided patient with [facility] number to call if his tube feeding is not at his house.   Per note, Patient cancer tx is planned to be held for next 2 weeks. Will follow up OP.   Labs: Bgs: 117-147, K:3.2,  BUN/Creat: 12/1.06 on admit -> 12/.62.   Meds: Carafate. Diflucan. IV abx. IV Fluids  Recent Labs  Lab 08/25/18 0544 08/26/18 0618 08/27/18 0500  NA 138 140 138  K 3.4* 3.3* 3.2*  CL 103 107 106  CO2 '27 26 26  ' BUN  '22 17 12  ' CREATININE 0.72 0.65 0.62  CALCIUM 8.0* 7.4* 7.3*  MG  --   --  1.8  GLUCOSE 105* 142* 147*    Diet Order:   Diet Order            Diet NPO time specified  Diet effective now             EDUCATION NEEDS:  No education needs have been identified at this time  Skin: draining radiotherapy ulcerations-neck  Last BM:  9/6  Height:  Ht Readings from Last 1 Encounters:  08/24/18 '5\' 8"'  (1.727 m)   Weight:  Wt Readings from Last 1 Encounters:  08/27/18 75.2 kg   Wt Readings from Last 10 Encounters:  08/27/18 75.2 kg  08/18/18 71.4 kg  08/13/18 73.3 kg  08/12/18 72.4 kg  08/11/18 72.1 kg  08/09/18 71.4 kg  08/05/18 70.9 kg  08/03/18 71.5 kg  08/03/18 71.7 kg  08/02/18 72.5 kg    Ideal Body Weight:  70 kg  BMI:  Body mass index is 25.21 kg/m.  Estimated Nutritional Needs:  Kcal:  2000-2200 kcals (30-33 kcals/kg bw) Protein:  93-107g Pro (1.4-1.6 g/kg bw) Fluid:  >2 Liters (30 ml/kg bw)  [name] RD, LDN, CNSC Clinical Nutrition Available Tues-Sat via Pager: [phone] 08/27/2018 3:57 PM

## 2018-08-27 NOTE — Progress Notes (Signed)
Ambulated at steady pace to bathroom for bowel movement earlier.  Peg tube infusing with no difficulty.  Dressing changed and silvadene applied to neck after patient cleansed with soapy water.  Nonadherent and kerlix applied.  Patient continues to use suction for phlegm in throat.

## 2018-08-27 NOTE — Progress Notes (Signed)
I called and updated [place], RN at [facility] to update on patient's admission.  I advised that patient was still admitted and would be discharged when he is afebrile for 48 hours.  She wants an update Monday if patient is still admitted.  According to her he only has 2 more radiation treatments.

## 2018-08-27 NOTE — Progress Notes (Signed)
Per Dr. Delton Coombes, we will defer patient's treatment 2 weeks.  He is to come in for follow-up with Dr. Walden Field on 9/20 to assess for toxicity and possible dose reduction.

## 2018-08-28 LAB — BASIC METABOLIC PANEL
ANION GAP: 4 — AB (ref 5–15)
BUN: 10 mg/dL (ref 8–23)
CALCIUM: 7.4 mg/dL — AB (ref 8.9–10.3)
CO2: 28 mmol/L (ref 22–32)
Chloride: 104 mmol/L (ref 98–111)
Creatinine, Ser: 0.62 mg/dL (ref 0.61–1.24)
GLUCOSE: 146 mg/dL — AB (ref 70–99)
POTASSIUM: 3.6 mmol/L (ref 3.5–5.1)
Sodium: 136 mmol/L (ref 135–145)

## 2018-08-28 LAB — CBC WITH DIFFERENTIAL/PLATELET
BASOS ABS: 0 10*3/uL (ref 0.0–0.1)
BASOS PCT: 1 %
Eosinophils Absolute: 0.1 10*3/uL (ref 0.0–0.7)
Eosinophils Relative: 1 %
HEMATOCRIT: 26.1 % — AB (ref 39.0–52.0)
Hemoglobin: 8.8 g/dL — ABNORMAL LOW (ref 13.0–17.0)
LYMPHS ABS: 0.4 10*3/uL (ref 0.7–4.0)
LYMPHS PCT: 10 %
MCH: 31.3 pg (ref 26.0–34.0)
MCHC: 33.7 g/dL (ref 30.0–36.0)
MCV: 92.9 fL (ref 78.0–100.0)
MONO ABS: 0.4 10*3/uL (ref 0.1–1.0)
MONOS PCT: 10 %
NEUTROS ABS: 2.8 10*3/uL (ref 1.7–7.7)
Neutrophils Relative %: 78 %
PLATELETS: 30 10*3/uL — AB (ref 150–400)
RBC: 2.81 MIL/uL — AB (ref 4.22–5.81)
RDW: 14.6 % (ref 11.5–15.5)
WBC: 3.6 10*3/uL — ABNORMAL LOW (ref 4.0–10.5)

## 2018-08-28 LAB — GLUCOSE, CAPILLARY
GLUCOSE-CAPILLARY: 132 mg/dL — AB (ref 70–99)
GLUCOSE-CAPILLARY: 115 mg/dL — AB (ref 70–99)
Glucose-Capillary: 105 mg/dL — ABNORMAL HIGH (ref 70–99)
Glucose-Capillary: 109 mg/dL — ABNORMAL HIGH (ref 70–99)
Glucose-Capillary: 122 mg/dL — ABNORMAL HIGH (ref 70–99)
Glucose-Capillary: 123 mg/dL — ABNORMAL HIGH (ref 70–99)

## 2018-08-28 MED ORDER — AMOXICILLIN-POT CLAVULANATE 250-62.5 MG/5ML PO SUSR
500.0000 mg | Freq: Three times a day (TID) | ORAL | Status: DC
  Administered 2018-08-28 – 2018-08-29 (×4): 500 mg
  Filled 2018-08-28 (×6): qty 10

## 2018-08-28 MED ORDER — VANCOMYCIN HCL 10 G IV SOLR
1250.0000 mg | Freq: Two times a day (BID) | INTRAVENOUS | Status: DC
  Administered 2018-08-28: 1250 mg via INTRAVENOUS
  Filled 2018-08-28 (×5): qty 1250

## 2018-08-28 NOTE — Progress Notes (Signed)
Pharmacy Antibiotic Note  [name] is a 67 y.o. male admitted on 08/24/2018 with Febrile Neutropenia.  Pharmacy has been consulted for Vancomycin and Cefepime dosing.  Plan: Increase vancomycin to vancomycin 1.25gm iv q12h   Cefepime 2gm IV every 8 hours. Monitor labs, micro and vitals.   Height: 5\' 8"  (172.7 cm) Weight: 156 lb 3.2 oz (70.9 kg) IBW/kg (Calculated) : 68.4  Temp (24hrs), Avg:99.5 F (37.5 C), Min:99 F (37.2 C), Max:99.8 F (37.7 C)  Recent Labs  Lab 08/24/18 1538 08/24/18 1542 08/24/18 1818 08/24/18 2225 08/25/18 0544 08/26/18 0618 08/27/18 0500 08/27/18 1717 08/28/18 0637  WBC 0.9*  --   --   --  0.6* 1.6* 3.5*  --   --   CREATININE 1.06  --   --   --  0.72 0.65 0.62  --  0.62  LATICACIDVEN  --  1.54 2.43* 1.3  --   --   --   --   --   VANCOTROUGH  --   --   --   --   --   --   --  8*  --     Estimated Creatinine Clearance: 85.5 mL/min (by C-G formula based on SCr of 0.62 mg/dL).    No Known Allergies  Antimicrobials this admission: Vanc 9/3 >>  Cefepime 9/3 >>  Flagyl 9/3 >>  Dose adjustments this admission: Vancomycin 750mg  iv q12h resulted in vancomycin trough of 8, will increase to vancomycin 1250mg  iv q12h. Renal function stable  Microbiology results: 9/3 BCx: ngtd 9/3 UCx: insignificant growth     Thank you for allowing pharmacy to be a part of this patient's care.  Donna Christen Torianna Junio 08/28/2018 8:02 AM

## 2018-08-28 NOTE — Progress Notes (Signed)
Dressing changed to neck.  Patient washed with soapy water, silvadene, telfa and kerlix applied.  Redness and flaky skin

## 2018-08-28 NOTE — Progress Notes (Signed)
PROGRESS NOTE    [name]  ZOX:[id number] DOB: 1950/12/27 DOA: 08/24/2018 PCP: Raliegh Ip, DO     Brief Narrative:  67 y.o. male with medical history significant of impaired hearing, hyperlipidemia, PTSD, tonsillar cancer, massive oral cavity with progressive dysphagia, PEG tube placement on May 04, 2018 followed by Dr. Ellin Saba at the cancer center with chemoradiation therapy who is coming to the emergency department due to fever.  He has chronic sore throat and neck discomfort from radiation therapy.  He denies productive cough, emesis, diarrhea, dysuria or frequency.  He gets occasional nausea.  He denies chest pain, palpitations, dizziness, diaphoresis, PND, orthopnea or pitting edema of the lower extremities.  No melena or hematochezia.  No hematuria.  No polyuria, polydipsia, polyphagia or blurred vision.  He has lost a significant amount of weight.   Assessment & Plan: 1-Neutropenic fever (HCC) -Patient with severe mucositis and a high risk for aspiration PNA. -So far no growth on cultures taken -WBCs has remained elevated and his absolute neutrophils above 1.5.  Neupogen was discontinued on 08/27/2018. -Patient has now been afebrile for 24 hours and we will plan to discontinue broad-spectrum antibiotics and transition to Augmentin per tube. -Follow clinical response and he remains stable will discharge home on 08/29/2018. -Continue supportive care and PRN antipyretics.  2-Mass of oral cavity -actively receiving radiation and chemotherapy  -continue outpatient follow up with oncology service  -plan is to resume radiation at discharge -some improvement in left tonsillar mass size and size of left cervical adenopathy   3-Hyperlipidemia -not on medical therapy at this time. -patient on feeding tube and nutritional supplements only  4-severe protein calorie malnutrition -Continue Jevity and prostat  -Appreciate nutritional service recommendations.  5-pancytopenia  (HCC) -Chemotherapy-induced -No signs of overt bleeding -Patient had received Neupogen and WBCs up to 3.5 with absolute neutrophils of 3.0 -Follow hemoglobin trend and transfuse as needed.  6-Hyponatremia -continue IVF's; but adjust rate. -follow electrolytes trend -Urine within normal limits now.  7-Hyperbilirubinemia -patient denies nausea, vomiting and abd pain -will follow bilirubin trend intermittently. -LFT's WNL  8-hypokalemia: -Needed within normal limits currently. -Most likely associated with overall poor nutrition -Follow electrolytes trend.   DVT prophylaxis: SCDs Code Status: Full code Family Communication: No family at bedside.  Significant other updated over the phone as per patient's request. Disposition Plan: She will be observed for the next 24 hours after transitioning antibiotics from broad-spectrum IV regimen to oral Augmentin.  Continue supportive care follow clinical response; most likely home in a.m.  Consultants:   Oncology service  Procedures:   See below for x-ray reports.  Antimicrobials:  Anti-infectives (From admission, onward)   Start     Dose/Rate Route Frequency Ordered Stop   08/28/18 1345  amoxicillin-clavulanate (AUGMENTIN) 600-42.9 MG/5ML suspension 875 mg     875 mg Per Tube 2 times daily 08/28/18 1332     08/28/18 0930  vancomycin (VANCOCIN) 1,250 mg in sodium chloride 0.9 % 250 mL IVPB  Status:  Discontinued     1,250 mg 166.7 mL/hr over 90 Minutes Intravenous Every 12 hours 08/28/18 0802 08/28/18 1331   08/25/18 1000  fluconazole (DIFLUCAN) 40 MG/ML suspension 100 mg     100 mg Per Tube Daily 08/24/18 2202     08/25/18 0600  vancomycin (VANCOCIN) IVPB 750 mg/150 ml premix  Status:  Discontinued     750 mg 150 mL/hr over 60 Minutes Intravenous Every 12 hours 08/24/18 2234 08/28/18 0802   08/25/18 0000  ceFEPIme (MAXIPIME) 2 g in sodium chloride 0.9 % 100 mL IVPB  Status:  Discontinued     2 g 200 mL/hr over 30 Minutes  Intravenous Every 8 hours 08/24/18 2234 08/28/18 1331   08/24/18 1630  ceFEPIme (MAXIPIME) 2 g in sodium chloride 0.9 % 100 mL IVPB     2 g 200 mL/hr over 30 Minutes Intravenous  Once 08/24/18 1625 08/24/18 1823   08/24/18 1630  metroNIDAZOLE (FLAGYL) IVPB 500 mg  Status:  Discontinued     500 mg 100 mL/hr over 60 Minutes Intravenous Every 8 hours 08/24/18 1625 08/28/18 1331   08/24/18 1630  vancomycin (VANCOCIN) IVPB 1000 mg/200 mL premix     1,000 mg 200 mL/hr over 60 Minutes Intravenous  Once 08/24/18 1625 08/24/18 1935       Subjective: Feeling much better overall.  Has now remained afebrile for 24 hours.  No chest pain, no shortness of breath, no nausea, no vomiting.  Objective: Vitals:   08/27/18 2224 08/28/18 0500 08/28/18 0504 08/28/18 0656  BP: 108/61  104/68   Pulse: 91  85   Resp: 15  15   Temp: 99.6 F (37.6 C)  99.8 F (37.7 C)   TempSrc: Oral  Oral   SpO2: 96%  98%   Weight:  72.7 kg  70.9 kg  Height:        Intake/Output Summary (Last 24 hours) at 08/28/2018 1332 Last data filed at 08/28/2018 0656 Gross per 24 hour  Intake 1985 ml  Output 950 ml  Net 1035 ml   Filed Weights   08/27/18 0651 08/28/18 0500 08/28/18 0656  Weight: 75.2 kg 72.7 kg 70.9 kg    Examination: General exam: Alert, awake, oriented x 3; overall feeling much better.  Patient without fever now for 24 hours.  Denies chest pain or shortness of breath.  No nausea, no vomiting, no abdominal pain. Respiratory system: Positive rhonchi, no wheezing; good air movement bilaterally.  Cardiovascular system:RRR. No murmurs, rubs, gallops. Gastrointestinal system: Abdomen is nondistended, soft and nontender. No organomegaly or masses felt. Normal bowel sounds heard.  PEG tube in place (no signs of superimposed infection). Central nervous system: Alert and oriented. No focal neurological deficits. Extremities: No C/C/E, +pedal pulses Skin: Patient with skin breakdown and wound affecting his neck  postradiation; clean dressings in place no drainage appreciated. Psychiatry: Judgement and insight appear normal. Mood & affect appropriate.   Data Reviewed: I have personally reviewed following labs and imaging studies  CBC: Recent Labs  Lab 08/24/18 1538 08/25/18 0544 08/26/18 0618 08/27/18 0500 08/28/18 0637  WBC 0.9* 0.6* 1.6* 3.5* 3.6*  NEUTROABS 0.3*  --  1.1* 3.0 2.8  HGB 10.2* 8.7* 8.4* 8.4* 8.8*  HCT 30.1* 25.7* 24.5* 25.2* 26.1*  MCV 91.2 91.8 92.1 93.7 92.9  PLT 37* 28* 27* 27* 30*   Basic Metabolic Panel: Recent Labs  Lab 08/24/18 1538 08/25/18 0544 08/26/18 0618 08/27/18 0500 08/28/18 0637  NA 132* 138 140 138 136  K 3.5 3.4* 3.3* 3.2* 3.6  CL 94* 103 107 106 104  CO2 25 27 26 26 28   GLUCOSE 122* 105* 142* 147* 146*  BUN 25* 22 17 12 10   CREATININE 1.06 0.72 0.65 0.62 0.62  CALCIUM 8.5* 8.0* 7.4* 7.3* 7.4*  MG  --   --   --  1.8  --    GFR: Estimated Creatinine Clearance: 85.5 mL/min (by C-G formula based on SCr of 0.62 mg/dL).   Liver Function Tests: Recent  Labs  Lab 08/24/18 1538 08/25/18 0544  AST 20 16  ALT 23 20  ALKPHOS 52 44  BILITOT 1.8* 1.5*  PROT 7.3 5.8*  ALBUMIN 2.9* 2.4*   Urine analysis:    Component Value Date/Time   COLORURINE AMBER (A) 08/24/2018 1527   APPEARANCEUR CLOUDY (A) 08/24/2018 1527   LABSPEC 1.020 08/24/2018 1527   PHURINE 5.0 08/24/2018 1527   GLUCOSEU NEGATIVE 08/24/2018 1527   HGBUR NEGATIVE 08/24/2018 1527   BILIRUBINUR NEGATIVE 08/24/2018 1527   KETONESUR NEGATIVE 08/24/2018 1527   PROTEINUR 30 (A) 08/24/2018 1527   NITRITE NEGATIVE 08/24/2018 1527   LEUKOCYTESUR NEGATIVE 08/24/2018 1527    Recent Results (from the past 240 hour(s))  Urine culture     Status: Abnormal   Collection Time: 08/24/18  3:27 PM  Result Value Ref Range Status   Specimen Description   Final    URINE, CLEAN CATCH Performed at [facility], [address] [place], [place] [address]    Special Requests   Final     Normal Performed at [facility], [address], [place], [place] [address]    Culture (A)  Final    <10,000 COLONIES/mL INSIGNIFICANT GROWTH Performed at [facility], [address] [place], [place] [address]    Report Status 08/26/2018 FINAL  Final  Blood Culture (routine x 2)     Status: None (Preliminary result)   Collection Time: 08/24/18  3:38 PM  Result Value Ref Range Status   Specimen Description BLOOD PORTA CATH LEFT CHEST  Final   Special Requests   Final    BOTTLES DRAWN AEROBIC AND ANAEROBIC Blood Culture adequate volume   Culture   Final    NO GROWTH 4 DAYS Performed at [facility], [address] [place], [place] [address]    Report Status PENDING  Incomplete  Blood Culture (routine x 2)     Status: None (Preliminary result)   Collection Time: 08/24/18  3:45 PM  Result Value Ref Range Status   Specimen Description BLOOD LEFT ANTECUBITAL  Final   Special Requests   Final    BOTTLES DRAWN AEROBIC AND ANAEROBIC Blood Culture adequate volume   Culture   Final    NO GROWTH 4 DAYS Performed at [facility], [address] [place], [place] [address]    Report Status PENDING  Incomplete     Radiology Studies: No results found. Scheduled Meds: . amoxicillin-clavulanate  875 mg Per Tube q12n4p  . feeding supplement (PRO-STAT SUGAR FREE 64)  30 mL Per Tube Daily  . fluconazole  100 mg Per Tube Daily  . lidocaine  15 mL Mouth/Throat QID  . potassium chloride  40 mEq Oral Daily  . silver sulfADIAZINE   Topical BID  . sucralfate  1 g Oral TID WC & HS   Continuous Infusions: . 0.9 % NaCl with KCl 20 mEq / L 75 mL/hr at 08/28/18 1054  . feeding supplement (OSMOLITE 1.5 CAL) 1,000 mL (08/28/18 0103)     LOS: 4 days    Time spent: 30 minutes   Vassie Loll, MD [facility] Pager [phone]  If 7PM-7AM, please contact night-coverage [email or web address] Password [email or web address] 08/28/2018, 1:32 PM

## 2018-08-28 NOTE — Progress Notes (Signed)
Dressing change done per order. Patient cleansed wound himself with soap and water. Silvadene, non adherent dressing, and kerlex applied.

## 2018-08-29 LAB — CULTURE, BLOOD (ROUTINE X 2)
CULTURE: NO GROWTH
Culture: NO GROWTH
SPECIAL REQUESTS: ADEQUATE
SPECIAL REQUESTS: ADEQUATE

## 2018-08-29 LAB — CBC WITH DIFFERENTIAL/PLATELET
BASOS ABS: 0 10*3/uL (ref 0.0–0.1)
BASOS PCT: 1 %
EOS ABS: 0 10*3/uL (ref 0.0–0.7)
Eosinophils Relative: 2 %
HCT: 26.5 % — ABNORMAL LOW (ref 39.0–52.0)
HEMOGLOBIN: 8.8 g/dL — AB (ref 13.0–17.0)
LYMPHS ABS: 0.3 10*3/uL — AB (ref 0.7–4.0)
LYMPHS PCT: 17 %
MCH: 31.1 pg (ref 26.0–34.0)
MCHC: 33.2 g/dL (ref 30.0–36.0)
MCV: 93.6 fL (ref 78.0–100.0)
Monocytes Absolute: 0.3 10*3/uL (ref 0.1–1.0)
Monocytes Relative: 19 %
NEUTROS ABS: 1 10*3/uL — AB (ref 1.7–7.7)
Neutrophils Relative %: 61 %
Platelets: 36 10*3/uL — ABNORMAL LOW (ref 150–400)
RBC: 2.83 MIL/uL — ABNORMAL LOW (ref 4.22–5.81)
RDW: 14.9 % (ref 11.5–15.5)
WBC: 1.6 10*3/uL — ABNORMAL LOW (ref 4.0–10.5)

## 2018-08-29 LAB — GLUCOSE, CAPILLARY
GLUCOSE-CAPILLARY: 105 mg/dL — AB (ref 70–99)
GLUCOSE-CAPILLARY: 119 mg/dL — AB (ref 70–99)
GLUCOSE-CAPILLARY: 130 mg/dL — AB (ref 70–99)
Glucose-Capillary: 115 mg/dL — ABNORMAL HIGH (ref 70–99)
Glucose-Capillary: 117 mg/dL — ABNORMAL HIGH (ref 70–99)
Glucose-Capillary: 133 mg/dL — ABNORMAL HIGH (ref 70–99)

## 2018-08-29 LAB — BASIC METABOLIC PANEL
Anion gap: 6 (ref 5–15)
BUN: 8 mg/dL (ref 8–23)
CHLORIDE: 103 mmol/L (ref 98–111)
CO2: 28 mmol/L (ref 22–32)
CREATININE: 0.57 mg/dL — AB (ref 0.61–1.24)
Calcium: 7.5 mg/dL — ABNORMAL LOW (ref 8.9–10.3)
GFR calc Af Amer: >60 mL/min (ref >60)
GFR calc non Af Amer: >60 mL/min (ref >60)
Glucose, Bld: 122 mg/dL — ABNORMAL HIGH (ref 70–99)
Potassium: 3.8 mmol/L (ref 3.5–5.1)
SODIUM: 137 mmol/L (ref 135–145)

## 2018-08-29 MED ORDER — HEPARIN SOD (PORK) LOCK FLUSH 100 UNIT/ML IV SOLN
500.0000 [IU] | INTRAVENOUS | Status: DC | PRN
  Filled 2018-08-29: qty 5

## 2018-08-29 MED ORDER — OSMOLITE 1.5 CAL PO LIQD: 237.0000 mL | Freq: Two times a day (BID) | ORAL | 6 refills | Status: DC

## 2018-08-29 MED ORDER — AMOXICILLIN-POT CLAVULANATE 250-62.5 MG/5ML PO SUSR: 500.0000 mg | Freq: Three times a day (TID) | ORAL | 0 refills | Status: AC

## 2018-08-29 MED ORDER — PRO-STAT SUGAR FREE PO LIQD: 30.0000 mL | Freq: Every day | ORAL | 1 refills | Status: DC

## 2018-08-29 NOTE — Progress Notes (Signed)
Port flushed with 500 units/70ml heparin and then deaccessed .

## 2018-08-29 NOTE — Discharge Summary (Signed)
Physician Discharge Summary  [name] UEA:[id number] DOB: 1951/01/21 DOA: 08/24/2018  PCP: Raliegh Ip, DO  Admit date: 08/24/2018 Discharge date: 08/29/2018  Time spent: 35 minutes  Recommendations for Outpatient Follow-up:  Repeat CBC with diff to follow platelets, WBC's and Hgb trend. Repeat BMET to follow electrolytes and renal function   Discharge Diagnoses:  Principal Problem:   Neutropenic fever (HCC) Active Problems:   Mass of oral cavity   Tonsillar cancer (HCC)   Hyperlipidemia   Pancytopenia (HCC)   Hyponatremia   Hyperbilirubinemia   Protein-calorie malnutrition, severe   Discharge Condition: stable and improved. Patient afebrile for 48 hours prior to discharge home. Instructed to follow up with PCP, oncology service and radiation oncologist at discharge. HH services resumed at discharge (PT and RN)  Diet recommendation: patient receiving nutrition through PEG  Filed Weights   08/28/18 0500 08/28/18 0656 08/29/18 0500  Weight: 72.7 kg 70.9 kg 70.1 kg    History of present illness:  67 y.o.malewith medical history significant of impaired hearing, hyperlipidemia, PTSD, tonsillar cancer, massive oral cavity with progressive dysphagia, PEG tube placement on May 04, 2018 followed by Dr. Ellin Saba at the cancer center with chemoradiation therapy who is coming to the emergency department due to fever. He has chronic sore throat and neck discomfort from radiation therapy. He denies productive cough, emesis, diarrhea, dysuria or frequency. He gets occasional nausea. He denies chest pain, palpitations, dizziness, diaphoresis, PND, orthopnea or pitting edema of the lower extremities. No melena or hematochezia. No hematuria. No polyuria, polydipsia, polyphagia or blurred vision. He has lost a significant amount of weight.  Hospital Course:  1-Neutropenic fever [facility]) -Patient with severe mucositis and a high risk for aspiration PNA. -So far no growth on  cultures taken -WBCs has remained elevated and his absolute neutrophils above 1. Neupogen was discontinued on 08/27/2018. -Patient has now been afebrile for 48 hours and will be discharge on Augmentin per tube, with intentions to treat him for another 10 days. . -Continue PRN antipyretics.  2-Mass of oral cavity -actively receiving radiation and chemotherapy  -continue outpatient follow up with oncology service  -plan is to resume radiation at discharge -some improvement in left tonsillar mass size and size of left cervical adenopathy   3-Hyperlipidemia -not on medical therapy at this time. -patient on feeding tube and nutritional supplements only  4-severe protein calorie malnutrition -Continue Jevity and prostat  -Appreciate nutritional service recommendations. -patient advise to maintain adequate hydration   5-pancytopenia (HCC) -Chemotherapy-induced -No signs of overt bleeding -Patient had received Neupogen and WBCs up and absolute neutrophils above 1 -Follow hemoglobin trend and transfuse as needed.  6-Hyponatremia -WNL at discharge -follow electrolytes trend with repeat BMET at follow up visit.   7-Hyperbilirubinemia -patient denies nausea, vomiting and abd pain -bilirubin level trended down and no icterus seen. -LFT's WNL  8-hypokalemia: -repleted and within normal limits currently. -Most likely associated with overall poor nutrition -Follow BMET at follow up visit to reassess electrolytes trend.  Procedures:  See below for x-ray reports   Consultations:  Oncology service   Discharge Exam: Vitals:   08/28/18 2203 08/29/18 0652  BP: (!) 103/51 104/72  Pulse: 81 86  Resp: 18 18  Temp: 98.1 F (36.7 C) 97.6 F (36.4 C)  SpO2: 100% 99%   General exam: Alert, awake, oriented x 3; overall feeling much better.  Patient without fever now for 48 hours.  Denies chest pain or shortness of breath.  No nausea, no vomiting, no  abdominal pain. Respiratory  system: Positive rhonchi bilaterally, no wheezing; good air movement bilaterally.  Cardiovascular system: RRR. No murmurs, rubs, gallops. Gastrointestinal system: Abdomen is nondistended, soft and nontender. No organomegaly or masses felt. Normal bowel sounds heard.  PEG tube in place (no signs of superimposed infection). Central nervous system: Alert and oriented. No focal neurological deficits. Extremities: No C/C/E, +pedal pulses Skin: Patient with skin breakdown and wound affecting his neck postradiation; clean dressings in place no drainage appreciated. Psychiatry: Judgement and insight appear normal. Mood & affect appropriate.    Discharge Instructions   Discharge Instructions    Diet - low sodium heart healthy   Complete by:  As directed    Diet - low sodium heart healthy   Complete by:  As directed    Discharge instructions   Complete by:  As directed    Maintain adequate hydration Take medications as prescribed Follow up with oncology service and radiation oncology as previously scheduled.   Increase activity slowly   Complete by:  As directed      Allergies as of 08/29/2018   No Known Allergies     Medication List    TAKE these medications   amoxicillin-clavulanate 250-62.5 MG/5ML suspension Commonly known as:  AUGMENTIN Place 10 mLs (500 mg total) into feeding tube 3 (three) times daily for 10 days.   feeding supplement (OSMOLITE 1.5 CAL) Liqd Place 237 mLs into feeding tube 2 (two) times daily.   feeding supplement (PRO-STAT SUGAR FREE 64) Liqd Place 30 mLs into feeding tube daily. Start taking on:  08/30/2018   fluconazole 10 MG/ML suspension Commonly known as:  DIFLUCAN Place 100 mg into feeding tube daily.   ibuprofen 200 MG tablet Commonly known as:  ADVIL,MOTRIN Take 400 mg by mouth every 6 (six) hours as needed for moderate pain.   lidocaine 2 % solution Commonly known as:  XYLOCAINE Viscous lidocaine 2%/ Maalox 1:1 mixture. Swish and swallow 1  tablespoon four times a day100 What changed:    how much to take  how to take this  when to take this   lidocaine-prilocaine cream Commonly known as:  EMLA Apply to affected area once What changed:    how much to take  how to take this  when to take this   morphine CONCENTRATE 10 mg / 0.5 ml concentrated solution Take 0.5 mLs (10 mg total) by mouth every 3 (three) hours as needed for severe pain.   oxyCODONE-acetaminophen 5-325 MG tablet Commonly known as:  PERCOCET/ROXICET Take one tablet by mouth every 4 hours as needed for pain.   prochlorperazine 10 MG tablet Commonly known as:  COMPAZINE Take 1 tablet (10 mg total) by mouth every 6 (six) hours as needed (Nausea or vomiting).   sodium fluoride 1.1 % Gel dental gel Commonly known as:  FLUORISHIELD Instill one drop of gel per tooth space of fluoride tray. Place over teeth for 5 minutes. Remove. Spit out excess. Repeat nightly. What changed:    how much to take  how to take this  when to take this   sucralfate 1 GM/10ML suspension Commonly known as:  CARAFATE Carafate 1gm/1ml & Viscous lidocaine 2% 1:1 mixture.  Swish and swallow 1 tablespoon four times a day. What changed:    how to take this  when to take this            Durable Medical Equipment  (From admission, onward)         Start  Ordered   08/26/18 1444  For home use only DME Suction  Once    Question:  Suction  Answer:  Oral   08/26/18 1443         No Known Allergies Follow-up Information    Raliegh Ip, DO. Schedule an appointment as soon as possible for a visit in 10 day(s).   Specialty:  Family Medicine Contact information: [address] [place] [address] [phone]           The results of significant diagnostics from this hospitalization (including imaging, microbiology, ancillary and laboratory) are listed below for reference.    Significant Diagnostic Studies: Dg Chest 2 View  Result Date:  08/24/2018 CLINICAL DATA:  Fever EXAM: CHEST - 2 VIEW COMPARISON:  07/02/2018 FINDINGS: Left-sided central venous port tip over the SVC. No acute airspace disease or effusion. Stable cardiomediastinal silhouette with aortic atherosclerosis. No pneumothorax. Degenerative changes of the spine. IMPRESSION: No active cardiopulmonary disease. Electronically Signed   By: Jasmine Pang M.D.   On: 08/24/2018 17:12   Ct Soft Tissue Neck W Contrast  Result Date: 08/24/2018 CLINICAL DATA:  67 y/o M; sore throat, stridor, thick sputum, lethargy, dehydration, weakness. Evaluate for abscess. No neck cancer with chemo and radiation therapy. EXAM: CT NECK WITH CONTRAST TECHNIQUE: Multidetector CT imaging of the neck was performed using the standard protocol following the bolus administration of intravenous contrast. CONTRAST:  75mL OMNIPAQUE IOHEXOL 300 MG/ML  SOLN COMPARISON:  05/31/2018 PET-CT FINDINGS: Pharynx and larynx: Decreased soft tissue fullness of left palatine tonsil extending inferiorly along the glossal tonsillar sulcus at site of prior FDG avid tonsillar cancer. Interval development of smooth mucosal enhancement and low-attenuation mucosal thickening of the oropharynx and hypopharynx compatible with radiation mucositis, left-greater-than-right. No abscess or no exophytic mass identified. Salivary glands: No inflammation, mass, or stone. Thyroid: Normal. Lymph nodes: Left cervical FDG avid lymphadenopathy the level 2, level 3, level 5 levels are now normal in size by imaging criteria. There is fat stranding throughout the anterior and posterior cervical triangles likely representing radiation changes. No new masslike enhancement identified. Vascular: Left port catheter tip extends below the field of view in the SVC. Mild calcific aortic atherosclerosis. Limited intracranial: Negative. Visualized orbits: Negative. Mastoids and visualized paranasal sinuses: Partial opacification of the right mastoid air cells  Skeleton: Moderate cervical spondylosis with multilevel disc and facet degenerative changes greatest at the C3-4 and C5-C7 levels. No high-grade bony canal stenosis. Upper chest: Stable left upper lobe 6 mm ground-glass nodule (series 2, image 121), 3 mm nodule in right lung apex (series 2, image 102), and 3 mm juxtapleural nodule of right upper lobe (series 2, image 128). Other: None. IMPRESSION: 1. Interval development of low-attenuation mucosal thickening and smooth enhancement of the oral and hypopharynx compatible with radiation mucositis. No abscess or new mass identified. 2. Decreased size of left palatine tonsil mass. 3. Left cervical lymphadenopathy identified on prior PET-CT are now normal in size by imaging criteria. No new lymphadenopathy. 4. Stable tiny pulmonary nodules of unlikely significance, attention at follow-up recommended. Electronically Signed   By: Mitzi Hansen M.D.   On: 08/24/2018 18:32   US Abdomen Limited Ruq  Result Date: 08/25/2018 CLINICAL DATA:  Increased bilirubin. EXAM: ULTRASOUND ABDOMEN LIMITED RIGHT UPPER QUADRANT COMPARISON:  PET-CT 05/31/2018 FINDINGS: Gallbladder: The gallbladder is incompletely distended and contains multiple calculi, the largest measuring 8 mm. The posterior wall of the gallbladder is obscured by dense shadowing from the present calculi. Wall thickness  measures 3.4 mm. The sonographic Murphy's sign is recorded is negative. Common bile duct: Diameter: 3.9 mm Liver: No focal lesion identified. Within normal limits in parenchymal echogenicity. Portal vein is patent on color Doppler imaging with normal direction of blood flow towards the liver. IMPRESSION: Marked cholelithiasis. Equivocal borderline thickening of the gallbladder wall, given the gallbladder is not completely distended. Correlation with HIDA scan or repeated fasting right upper quadrant ultrasound may be considered if found clinically necessary. Normal appearance of the liver.  Electronically Signed   By: Ted Mcalpine M.D.   On: 08/25/2018 13:21    Microbiology: Recent Results (from the past 240 hour(s))  Urine culture     Status: Abnormal   Collection Time: 08/24/18  3:27 PM  Result Value Ref Range Status   Specimen Description   Final    URINE, CLEAN CATCH Performed at [facility], [address], [place], [place] [address]    Special Requests   Final    Normal Performed at [facility], [address], [place], [place] [address]    Culture (A)  Final    <10,000 COLONIES/mL INSIGNIFICANT GROWTH Performed at [facility], [address], [place], [place] [address]    Report Status 08/26/2018 FINAL  Final  Blood Culture (routine x 2)     Status: None   Collection Time: 08/24/18  3:38 PM  Result Value Ref Range Status   Specimen Description BLOOD PORTA CATH LEFT CHEST  Final   Special Requests   Final    BOTTLES DRAWN AEROBIC AND ANAEROBIC Blood Culture adequate volume   Culture   Final    NO GROWTH 5 DAYS Performed at [facility], [address], [place], [place] [address]    Report Status 08/29/2018 FINAL  Final  Blood Culture (routine x 2)     Status: None   Collection Time: 08/24/18  3:45 PM  Result Value Ref Range Status   Specimen Description BLOOD LEFT ANTECUBITAL  Final   Special Requests   Final    BOTTLES DRAWN AEROBIC AND ANAEROBIC Blood Culture adequate volume   Culture   Final    NO GROWTH 5 DAYS Performed at [facility], [address] [place], [place] [address]    Report Status 08/29/2018 FINAL  Final     Labs: Basic Metabolic Panel: Recent Labs  Lab 08/25/18 0544 08/26/18 0618 08/27/18 0500 08/28/18 0637 08/29/18 0620  NA 138 140 138 136 137  K 3.4* 3.3* 3.2* 3.6 3.8  CL 103 107 106 104 103  CO2 27 26 26 28 28   GLUCOSE 105* 142* 147* 146* 122*  BUN 22 17 12 10 8   CREATININE 0.72 0.65 0.62 0.62 0.57*  CALCIUM 8.0* 7.4* 7.3* 7.4* 7.5*  MG  --   --  1.8  --   --    Liver Function Tests: Recent Labs   Lab 08/24/18 1538 08/25/18 0544  AST 20 16  ALT 23 20  ALKPHOS 52 44  BILITOT 1.8* 1.5*  PROT 7.3 5.8*  ALBUMIN 2.9* 2.4*   CBC: Recent Labs  Lab 08/24/18 1538 08/25/18 0544 08/26/18 0618 08/27/18 0500 08/28/18 0637 08/29/18 0620  WBC 0.9* 0.6* 1.6* 3.5* 3.6* 1.6*  NEUTROABS 0.3*  --  1.1* 3.0 2.8 1.0*  HGB 10.2* 8.7* 8.4* 8.4* 8.8* 8.8*  HCT 30.1* 25.7* 24.5* 25.2* 26.1* 26.5*  MCV 91.2 91.8 92.1 93.7 92.9 93.6  PLT 37* 28* 27* 27* 30* 36*    CBG: Recent Labs  Lab 08/28/18 1956 08/29/18 0000 08/29/18 0403 08/29/18 0748 08/29/18 1136  GLUCAP 109* 130* 117* 133* 119*    Signed:  Vassie Loll MD.  [facility] 08/29/2018, 12:09 PM

## 2018-08-29 NOTE — Care Management (Signed)
Pt to d/c today with [facility] services through [facility].  Suction had been ordered from [facility] but has not been delivered.  Jermaine with [facility] will have suction delivered to patient's room prior to d/c per pt request.  RN aware.

## 2018-08-30 ENCOUNTER — Telehealth (HOSPITAL_COMMUNITY): Payer: Self-pay

## 2018-08-30 ENCOUNTER — Ambulatory Visit (HOSPITAL_COMMUNITY): Payer: Self-pay

## 2018-08-30 ENCOUNTER — Ambulatory Visit (HOSPITAL_COMMUNITY): Payer: Self-pay | Admitting: Internal Medicine

## 2018-08-30 NOTE — Telephone Encounter (Signed)
[place] [facility] called to get a verbal order to continue home health services. Verbal order given per Francene Finders NP.

## 2018-08-31 ENCOUNTER — Ambulatory Visit (HOSPITAL_COMMUNITY): Payer: Self-pay

## 2018-09-01 ENCOUNTER — Ambulatory Visit (HOSPITAL_COMMUNITY): Payer: Self-pay

## 2018-09-02 ENCOUNTER — Ambulatory Visit (HOSPITAL_COMMUNITY): Payer: Self-pay

## 2018-09-03 ENCOUNTER — Encounter (HOSPITAL_COMMUNITY): Payer: Self-pay

## 2018-09-10 ENCOUNTER — Encounter (HOSPITAL_COMMUNITY): Payer: Self-pay | Admitting: Internal Medicine

## 2018-09-10 ENCOUNTER — Inpatient Hospital Stay (HOSPITAL_COMMUNITY): Payer: Medicare Other

## 2018-09-10 ENCOUNTER — Inpatient Hospital Stay (HOSPITAL_COMMUNITY): Payer: Medicare Other | Attending: Hematology | Admitting: Internal Medicine

## 2018-09-10 VITALS — BP 127/59 | HR 98 | Temp 97.9°F | Resp 16 | Wt 158.4 lb

## 2018-09-10 DIAGNOSIS — E785 Hyperlipidemia, unspecified: Secondary | ICD-10-CM | POA: Diagnosis not present

## 2018-09-10 DIAGNOSIS — R634 Abnormal weight loss: Secondary | ICD-10-CM

## 2018-09-10 DIAGNOSIS — F4310 Post-traumatic stress disorder, unspecified: Secondary | ICD-10-CM

## 2018-09-10 DIAGNOSIS — R918 Other nonspecific abnormal finding of lung field: Secondary | ICD-10-CM | POA: Diagnosis not present

## 2018-09-10 DIAGNOSIS — H9190 Unspecified hearing loss, unspecified ear: Secondary | ICD-10-CM

## 2018-09-10 DIAGNOSIS — I129 Hypertensive chronic kidney disease with stage 1 through stage 4 chronic kidney disease, or unspecified chronic kidney disease: Secondary | ICD-10-CM | POA: Insufficient documentation

## 2018-09-10 DIAGNOSIS — C099 Malignant neoplasm of tonsil, unspecified: Secondary | ICD-10-CM | POA: Diagnosis not present

## 2018-09-10 DIAGNOSIS — E871 Hypo-osmolality and hyponatremia: Secondary | ICD-10-CM | POA: Insufficient documentation

## 2018-09-10 DIAGNOSIS — K1230 Oral mucositis (ulcerative), unspecified: Secondary | ICD-10-CM | POA: Insufficient documentation

## 2018-09-10 DIAGNOSIS — N189 Chronic kidney disease, unspecified: Secondary | ICD-10-CM | POA: Insufficient documentation

## 2018-09-10 DIAGNOSIS — Z5111 Encounter for antineoplastic chemotherapy: Secondary | ICD-10-CM | POA: Insufficient documentation

## 2018-09-10 DIAGNOSIS — Z87891 Personal history of nicotine dependence: Secondary | ICD-10-CM | POA: Insufficient documentation

## 2018-09-10 DIAGNOSIS — E43 Unspecified severe protein-calorie malnutrition: Secondary | ICD-10-CM | POA: Diagnosis not present

## 2018-09-10 DIAGNOSIS — I700 Atherosclerosis of aorta: Secondary | ICD-10-CM

## 2018-09-10 DIAGNOSIS — Z79899 Other long term (current) drug therapy: Secondary | ICD-10-CM | POA: Insufficient documentation

## 2018-09-10 LAB — COMPREHENSIVE METABOLIC PANEL
ALBUMIN: 3.4 g/dL — AB (ref 3.5–5.0)
ALT: 13 U/L (ref 0–44)
ANION GAP: 7 (ref 5–15)
AST: 18 U/L (ref 15–41)
Alkaline Phosphatase: 58 U/L (ref 38–126)
BUN: 13 mg/dL (ref 8–23)
CALCIUM: 9 mg/dL (ref 8.9–10.3)
CHLORIDE: 100 mmol/L (ref 98–111)
CO2: 33 mmol/L — AB (ref 22–32)
Creatinine, Ser: 0.73 mg/dL (ref 0.61–1.24)
GFR calc Af Amer: >60 mL/min (ref >60)
GFR calc non Af Amer: >60 mL/min (ref >60)
GLUCOSE: 126 mg/dL — AB (ref 70–99)
POTASSIUM: 3.7 mmol/L (ref 3.5–5.1)
SODIUM: 140 mmol/L (ref 135–145)
Total Bilirubin: 0.7 mg/dL (ref 0.3–1.2)
Total Protein: 6.9 g/dL (ref 6.5–8.1)

## 2018-09-10 LAB — CBC WITH DIFFERENTIAL/PLATELET
BASOS PCT: 0 %
Basophils Absolute: 0 10*3/uL (ref 0.0–0.1)
Eosinophils Absolute: 0 10*3/uL (ref 0.0–0.7)
Eosinophils Relative: 1 %
HEMATOCRIT: 33.5 % — AB (ref 39.0–52.0)
HEMOGLOBIN: 10.5 g/dL — AB (ref 13.0–17.0)
LYMPHS ABS: 0.5 10*3/uL — AB (ref 0.7–4.0)
LYMPHS PCT: 15 %
MCH: 31.4 pg (ref 26.0–34.0)
MCHC: 31.3 g/dL (ref 30.0–36.0)
MCV: 100.3 fL — AB (ref 78.0–100.0)
MONO ABS: 0.6 10*3/uL (ref 0.1–1.0)
MONOS PCT: 21 %
NEUTROS ABS: 1.9 10*3/uL (ref 1.7–7.7)
NEUTROS PCT: 63 %
Platelets: 278 10*3/uL (ref 150–400)
RBC: 3.34 MIL/uL — ABNORMAL LOW (ref 4.22–5.81)
RDW: 18.4 % — ABNORMAL HIGH (ref 11.5–15.5)
WBC: 3 10*3/uL — ABNORMAL LOW (ref 4.0–10.5)

## 2018-09-10 LAB — MAGNESIUM: Magnesium: 2.3 mg/dL (ref 1.7–2.4)

## 2018-09-10 NOTE — Progress Notes (Signed)
Diagnosis Malignant neoplasm of tonsil (HCC) - Plan: Magnesium, CBC with Differential/Platelet, Comprehensive metabolic panel, CBC with Differential/Platelet, Comprehensive metabolic panel, Lactate dehydrogenase, Magnesium, CBC with Differential/Platelet, Comprehensive metabolic panel, Comprehensive metabolic panel, CBC with Differential/Platelet, Magnesium  Staging Cancer Staging Tonsillar cancer (HCC) Staging form: Pharynx - HPV-Mediated Oropharynx, AJCC 8th Edition - Clinical stage from 06/02/2018: Stage II (cT3, cN2, cM0) - Unsigned   Assessment and Plan:  Malignant neoplasm of tonsil (HCC) 1.  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 Teoh on 05/13/2018, status post biopsy consistent with squamous cell carcinoma, P 16+ -Weight loss of 16 pounds since the start of treatment. - Baseline hearing loss and chronic kidney disease with creatinine of 1.34. - Oropharyngeal examination shows a necrotic left tonsillar mass, not clearly visualized secondary to increased gag reflex.  There is a left lower neck lymph node palpable.   Pet scan done 05/31/2018 showed  IMPRESSION: 1. Intense hypermetabolism in the left palatine tonsil with associated soft tissue fullness on the CT images, compatible with the provided history of primary left palatine tonsil malignancy. 2. Hypermetabolic left level 2, level 3 and level 5 neck nodal metastases. Solitary mildly prominent and mildly hypermetabolic right level 2 neck lymph node, suspicious for contralateral nodal metastasis. 3. No evidence of hypermetabolic metastatic disease in the chest, abdomen, pelvis or skeleton. 4. Solitary 4 mm solid left lower lobe pulmonary nodule, below PET resolution, recommend attention on follow-up chest CT in 3-6 months. 5.  Aortic Atherosclerosis (ICD10-I70.0).  -Pt is followed by Dr. Katraggada and is  being treated with chemoradiation.  He is not a candidate for high-dose cisplatin given his chronic kidney disease and hearing dysfunction.  He is also not a candidate for weekly cisplatin as his creatinine might get worse.  He is being treated with 94-01 French protocol with 5-FU administered as a 24-hour continuous infusion at a dose of 600 mg per M square per day for 4 days and carboplatin given as a daily bolus of 70 mg/m2/day for 4 days.  Chemo will be given on days 1, 22 and 43.  We will have a port placed by 1 of our surgeons. - He received first cycle of carboplatin and 5-FU on 07/13/2018. - He developed severe mucositis after cycle 1 and lost about 15 pounds. - Had a PEG tube placed on 08/04/2018.  He is taking in 3 cans of Osmolite as bolus.  He is also drinking about 2 cans of Ensure per day.  He is not able to eat any soft foods.  He does not have any severe pain while swallowing. - He received chemotherapy cycle 2 on 08/10/2018.  He developed mucositis again. - He is only taking 3 cans of Osmolite as bolus.  Because of mucositis, he is not drinking Ensure anymore.  I have suggested him to increase Osmolite to 2 cans 3 times a day.  That will give him approximately 2100 cal.  He will use Maalox with lidocaine for mucositis.  Labs done 09/10/2018 reviewed and showed WBC 3 HB 10.5 plts 278,000.  Chemistries WNL with K+ 3.7, Cr 0.73, MG 2.3 and normal LFTS.  He will RTC 09/13/2018 for C3 of chemotherapy.  He will have labs done in 1 week after C3.    2.  Mucositis/Increased secretions.  Pt encouraged to maintain hydration and use mouthwash prn.  Will continue to monitor as C3 begins.      30 minutes spent with more than 50% spent in counseling and coordination of care.    Current Status:  Pt seen today for evaluation prior to C3.  He reports increased secretions.      Tonsillar cancer ([place])   06/02/2018 Initial Diagnosis    Tonsillar cancer ([place])    06/15/2018 -  Chemotherapy    The patient had  palonosetron (ALOXI) injection 0.25 mg, 0.25 mg, Intravenous,  Once, 3 of 3 cycles Administration: 0.25 mg (07/13/2018), 0.25 mg (07/15/2018), 0.25 mg (08/10/2018) CARBOplatin (PARAPLATIN) 140 mg in sodium chloride 0.9 % 100 mL chemo infusion, 70 mg/m2 = 140 mg (100 % of original dose 70 mg/m2), Intravenous,  Once, 3 of 3 cycles Dose modification: 70 mg/m2 (original dose 70 mg/m2, Cycle 1, Reason: Other (see comments), Comment: french protocol head and neck) Administration: 140 mg (07/13/2018), 140 mg (07/14/2018), 140 mg (07/15/2018), 140 mg (07/19/2018), 140 mg (08/10/2018), 140 mg (08/11/2018), 140 mg (08/12/2018), 140 mg (08/13/2018) fluorouracil (ADRUCIL) 4,750 mg in sodium chloride 0.9 % 55 mL chemo infusion, 600 mg/m2/day = 4,750 mg (100 % of original dose 600 mg/m2/day), Intravenous, 4D (96 hours ), 3 of 3 cycles Dose modification: 600 mg/m2/day (original dose 600 mg/m2/day, Cycle 1, Reason: Other (see comments), Comment: french protocol head and neck) Administration: 4,750 mg (07/13/2018), 4,750 mg (08/10/2018)  for chemotherapy treatment.       Problem List Patient Active Problem List   Diagnosis Date Noted  . Protein-calorie malnutrition, severe [E43] 08/25/2018  . Neutropenic fever ([place]) [D70.9, R50.81] 08/24/2018  . Hyperlipidemia [E78.5] 08/24/2018  . Pancytopenia ([place]) [M42.683] 08/24/2018  . Hyponatremia [E87.1] 08/24/2018  . Hyperbilirubinemia [E80.6] 08/24/2018  . Problems with swallowing and mastication [R13.10]   . Cellulitis and perichondritis of larynx [J38.7]   . Chronic periodontitis [K05.30] 06/10/2018  . Partial loss of teeth, unspecified edentulism [K08.409] 06/10/2018  . Tonsillar cancer ([place]) [C09.9] 06/02/2018  . Mass of oral cavity [K13.70] 05/12/2018  . Positive FIT (fecal immunochemical test) [R19.5] 05/12/2018    Past Medical History Past Medical History:  Diagnosis Date  . HOH (hard of hearing)   . Hyperlipidemia   . Mass of oral cavity 05/12/2018  . Positive  FIT (fecal immunochemical test) 05/12/2018  . PTSD (post-traumatic stress disorder)    has not been offically diagnosised  . Tonsillar cancer ([place]) 06/02/2018    Past Surgical History Past Surgical History:  Procedure Laterality Date  . APPENDECTOMY  1969  . COLONOSCOPY W/ POLYPECTOMY     remote past  . ESOPHAGOGASTRODUODENOSCOPY  08/04/2018   Dr. Arnoldo Morale: erythema of larynx, normal stomach, normal duodenum. PEG Placed 20 F, bumper at 2.5 cm  . ESOPHAGOGASTRODUODENOSCOPY (EGD) WITH PROPOFOL N/A 08/04/2018   Procedure: ESOPHAGOGASTRODUODENOSCOPY (EGD) WITH PROPOFOL;  Surgeon: Aviva Signs, MD;  [facility];  Service: Gastroenterology;  Laterality: N/A;  . FINGER FRACTURE SURGERY Left   . MULTIPLE EXTRACTIONS WITH ALVEOLOPLASTY N/A 06/15/2018   Procedure: Extraction of tooth #'s 2,12,13,18,20,and 21 with alveoloplasty and gross debridement of remaining teeth;  Surgeon: Lenn Cal, DDS;  Location: [facility];  Service: Oral Surgery;  Laterality: N/A;  . PEG PLACEMENT N/A 08/04/2018   Procedure: PERCUTANEOUS ENDOSCOPIC GASTROSTOMY (PEG) PLACEMENT;  Surgeon: Aviva Signs, MD;  [facility];  Service: Gastroenterology;  Laterality: N/A;  . PORTACATH PLACEMENT Left 07/02/2018   Procedure: INSERTION PORT-A-CATH;  Surgeon: Aviva Signs, MD;  Location: [facility];  Service: General;  Laterality: Left;    Family History Family History  Problem  Relation Age of Onset  . Diabetes Father        died at age 68   . Colon cancer Neg Hx      Social History  reports that he quit smoking about 19 years ago. His smoking use included cigarettes. He quit after 15.00 years of use. He has never used smokeless tobacco. He reports that he drinks about 12.0 standard drinks of alcohol per week. He reports that he does not use drugs.  Medications  Current Outpatient Medications:  .  Amino Acids-Protein Hydrolys (FEEDING SUPPLEMENT, PRO-STAT SUGAR FREE 64,) LIQD, Place 30 mLs into feeding  tube daily., Disp: 900 mL, Rfl: 1 .  ibuprofen (ADVIL,MOTRIN) 200 MG tablet, Take 400 mg by mouth every 6 (six) hours as needed for moderate pain. , Disp: , Rfl:  .  lidocaine (XYLOCAINE) 2 % solution, Viscous lidocaine 2%/ Maalox 1:1 mixture. Swish and swallow 1 tablespoon four times a day100 (Patient taking differently: Use as directed 15 mLs in the mouth or throat 4 (four) times daily. Viscous lidocaine 2%/ Maalox 1:1 mixture. Swish and swallow 1 tablespoon four times a day100), Disp: 100 mL, Rfl: 0 .  lidocaine-prilocaine (EMLA) cream, Apply to affected area once (Patient taking differently: Apply 1 application topically as directed. Apply to affected area once), Disp: 30 g, Rfl: 3 .  Morphine Sulfate (MORPHINE CONCENTRATE) 10 mg / 0.5 ml concentrated solution, Take 0.5 mLs (10 mg total) by mouth every 3 (three) hours as needed for severe pain., Disp: 60 mL, Rfl: 0 .  Nutritional Supplements (FEEDING SUPPLEMENT, OSMOLITE 1.5 CAL,) LIQD, Place 237 mLs into feeding tube 2 (two) times daily., Disp: 3000 mL, Rfl: 6 .  oxyCODONE-acetaminophen (PERCOCET) 5-325 MG tablet, Take one tablet by mouth every 4 hours as needed for pain., Disp: 30 tablet, Rfl: 0 .  prochlorperazine (COMPAZINE) 10 MG tablet, Take 1 tablet (10 mg total) by mouth every 6 (six) hours as needed (Nausea or vomiting)., Disp: 30 tablet, Rfl: 1 .  sodium fluoride (FLUORISHIELD) 1.1 % GEL dental gel, Instill one drop of gel per tooth space of fluoride tray. Place over teeth for 5 minutes. Remove. Spit out excess. Repeat nightly. (Patient taking differently: Place 1 drop onto teeth See admin instructions. Instill one drop of gel per tooth space of fluoride tray. Place over teeth for 5 minutes. Remove. Spit out excess. Repeat nightly.), Disp: 120 mL, Rfl: prn .  sucralfate (CARAFATE) 1 GM/10ML suspension, Carafate 1gm/10ml & Viscous lidocaine 2% 1:1 mixture.  Swish and swallow 1 tablespoon four times a day. (Patient taking differently: Take by  mouth 4 (four) times daily. Carafate 1gm/10ml & Viscous lidocaine 2% 1:1 mixture.  Swish and swallow 1 tablespoon four times a day.), Disp: 360 mL, Rfl: 2  Allergies Patient has no known allergies.  Review of Systems Review of Systems - Oncology ROS negative other than mucositis and increased secretions.     Physical Exam  Vitals Wt Readings from Last 3 Encounters:  09/10/18 158 lb 6.4 oz (71.8 kg)  08/29/18 154 lb 8 oz (70.1 kg)  08/18/18 157 lb 4.8 oz (71.4 kg)   Temp Readings from Last 3 Encounters:  09/10/18 97.9 F (36.6 C) (Axillary)  08/29/18 97.7 F (36.5 C) (Oral)  08/14/18 98.1 F (36.7 C) (Oral)   BP Readings from Last 3 Encounters:  09/10/18 (!) 127/59  08/29/18 138/67  08/18/18 129/63   Pulse Readings from Last 3 Encounters:  09/10/18 98  08/29/18 (!) 43  08/18/18 80      Constitutional: Well-developed, well-nourished, and in no distress.   HENT: Head: Normocephalic and atraumatic.  Mouth/Throat: Increased secretions.   Eyes: Pupils are equal, round, and reactive to light. Conjunctivae are normal. No scleral icterus.  Neck: Normal range of motion. Neck supple. No JVD present.  Cardiovascular: Normal rate, regular rhythm and normal heart sounds.  Exam reveals no gallop and no friction rub.   No murmur heard. Pulmonary/Chest: Effort normal and breath sounds normal. No respiratory distress. No wheezes.No rales.  Abdominal: Soft. Bowel sounds are normal. No distension. There is no tenderness. There is no guarding.  Musculoskeletal: No edema or tenderness.  Lymphadenopathy: No cervical, axillary or supraclavicular adenopathy.  Neurological: Alert and oriented to person, place, and time. No cranial nerve deficit.  Skin: Skin is warm and dry. No rash noted. No erythema. No pallor.  Psychiatric: Affect and judgment normal.   Labs Office Visit on 09/10/2018  Component Date Value Ref Range Status  . Sodium 09/10/2018 140  135 - 145 mmol/L Final  . Potassium  09/10/2018 3.7  3.5 - 5.1 mmol/L Final  . Chloride 09/10/2018 100  98 - 111 mmol/L Final  . CO2 09/10/2018 33* 22 - 32 mmol/L Final  . Glucose, Bld 09/10/2018 126* 70 - 99 mg/dL Final  . BUN 09/10/2018 13  8 - 23 mg/dL Final  . Creatinine, Ser 09/10/2018 0.73  0.61 - 1.24 mg/dL Final  . Calcium 09/10/2018 9.0  8.9 - 10.3 mg/dL Final  . Total Protein 09/10/2018 6.9  6.5 - 8.1 g/dL Final  . Albumin 09/10/2018 3.4* 3.5 - 5.0 g/dL Final  . AST 09/10/2018 18  15 - 41 U/L Final  . ALT 09/10/2018 13  0 - 44 U/L Final  . Alkaline Phosphatase 09/10/2018 58  38 - 126 U/L Final  . Total Bilirubin 09/10/2018 0.7  0.3 - 1.2 mg/dL Final  . GFR calc non Af Amer 09/10/2018 >60  >60 mL/min Final  . GFR calc Af Amer 09/10/2018 >60  >60 mL/min Final   Comment: (NOTE) The eGFR has been calculated using the CKD EPI equation. This calculation has not been validated in all clinical situations. eGFR's persistently <60 mL/min signify possible Chronic Kidney Disease.   . Anion gap 09/10/2018 7  5 - 15 Final   Performed at [facility], [address] [place], [place] [address]  . WBC 09/10/2018 3.0* 4.0 - 10.5 K/uL Final  . RBC 09/10/2018 3.34* 4.22 - 5.81 MIL/uL Final  . Hemoglobin 09/10/2018 10.5* 13.0 - 17.0 g/dL Final  . HCT 09/10/2018 33.5* 39.0 - 52.0 % Final  . MCV 09/10/2018 100.3* 78.0 - 100.0 fL Final  . MCH 09/10/2018 31.4  26.0 - 34.0 pg Final  . MCHC 09/10/2018 31.3  30.0 - 36.0 g/dL Final  . RDW 09/10/2018 18.4* 11.5 - 15.5 % Final  . Platelets 09/10/2018 278  150 - 400 K/uL Final  . Neutrophils Relative % 09/10/2018 63  % Final  . Neutro Abs 09/10/2018 1.9  1.7 - 7.7 K/uL Final  . Lymphocytes Relative 09/10/2018 15  % Final  . Lymphs Abs 09/10/2018 0.5* 0.7 - 4.0 K/uL Final  . Monocytes Relative 09/10/2018 21  % Final  . Monocytes Absolute 09/10/2018 0.6  0.1 - 1.0 K/uL Final  . Eosinophils Relative 09/10/2018 1  % Final  . Eosinophils Absolute 09/10/2018 0.0  0.0 - 0.7 K/uL Final  .  Basophils Relative 09/10/2018 0  % Final  . Basophils Absolute 09/10/2018 0.0  0.0 - 0.1 K/uL   Final   Performed at [facility], [address] [place], [place] [address]  . Magnesium 09/10/2018 2.3  1.7 - 2.4 mg/dL Final   Performed at [facility], [address], [place], [place] [address]     Pathology Orders Placed This Encounter  Procedures  . Magnesium    Standing Status:   Future    Standing Expiration Date:   09/11/2019  . CBC with Differential/Platelet    Standing Status:   Future    Standing Expiration Date:   09/11/2019  . Comprehensive metabolic panel    Standing Status:   Future    Standing Expiration Date:   09/11/2019  . CBC with Differential/Platelet    Standing Status:   Future    Standing Expiration Date:   09/11/2019  . Comprehensive metabolic panel    Standing Status:   Future    Standing Expiration Date:   09/11/2019  . Lactate dehydrogenase    Standing Status:   Future    Standing Expiration Date:   09/11/2019  . Magnesium    Standing Status:   Future    Number of Occurrences:   1    Standing Expiration Date:   09/11/2019  . CBC with Differential/Platelet    Standing Status:   Future    Number of Occurrences:   1    Standing Expiration Date:   09/11/2019  . Comprehensive metabolic panel    Standing Status:   Future    Number of Occurrences:   1    Standing Expiration Date:   09/11/2019       Zoila Shutter MD

## 2018-09-10 NOTE — Patient Instructions (Signed)
[facility] Discharge Instructions  You saw Dr. Higgs today.   Thank you for choosing [facility] at [facility] to provide your oncology and hematology care.  To afford each patient quality time with our provider, please arrive at least 15 minutes before your scheduled appointment time.   If you have a lab appointment with the Cancer Center please come in thru the  Main Entrance and check in at the main information desk  You need to re-schedule your appointment should you arrive 10 or more minutes late.  We strive to give you quality time with our providers, and arriving late affects you and other patients whose appointments are after yours.  Also, if you no show three or more times for appointments you may be dismissed from the clinic at the providers discretion.     Again, thank you for choosing [facility].  Our hope is that these requests will decrease the amount of time that you wait before being seen by our physicians.       _____________________________________________________________  Should you have questions after your visit to [facility], please contact our office at [phone] between the hours of 8:00 a.m. and 4:30 p.m.  Voicemails left after 4:00 p.m. will not be returned until the following business day.  For prescription refill requests, have your pharmacy contact our office and allow 72 hours.    Cancer Center Support Programs:   > Cancer Support Group  2nd Tuesday of the month 1pm-2pm, Journey Room    

## 2018-09-13 ENCOUNTER — Ambulatory Visit (HOSPITAL_COMMUNITY): Payer: Self-pay

## 2018-09-13 ENCOUNTER — Inpatient Hospital Stay (HOSPITAL_COMMUNITY): Payer: Medicare Other

## 2018-09-13 VITALS — BP 109/60 | HR 75 | Temp 97.6°F | Resp 18 | Wt 154.8 lb

## 2018-09-13 DIAGNOSIS — C099 Malignant neoplasm of tonsil, unspecified: Secondary | ICD-10-CM | POA: Diagnosis not present

## 2018-09-13 MED ORDER — SODIUM CHLORIDE 0.9 % IV SOLN
70.0000 mg/m2 | Freq: Once | INTRAVENOUS | Status: AC
  Administered 2018-09-13: 140 mg via INTRAVENOUS
  Filled 2018-09-13: qty 14

## 2018-09-13 MED ORDER — SODIUM CHLORIDE 0.9 % IV SOLN
600.0000 mg/m2/d | INTRAVENOUS | Status: DC
  Administered 2018-09-13: 4750 mg via INTRAVENOUS
  Filled 2018-09-13: qty 95

## 2018-09-13 MED ORDER — PALONOSETRON HCL INJECTION 0.25 MG/5ML
INTRAVENOUS | Status: AC
  Filled 2018-09-13: qty 5

## 2018-09-13 MED ORDER — PALONOSETRON HCL INJECTION 0.25 MG/5ML
0.2500 mg | Freq: Once | INTRAVENOUS | Status: AC
  Administered 2018-09-13: 0.25 mg via INTRAVENOUS

## 2018-09-13 MED ORDER — SODIUM CHLORIDE 0.9% FLUSH
10.0000 mL | INTRAVENOUS | Status: DC | PRN
  Administered 2018-09-13: 10 mL
  Filled 2018-09-13: qty 10

## 2018-09-13 MED ORDER — SODIUM CHLORIDE 0.9 % IV SOLN
10.0000 mg | Freq: Once | INTRAVENOUS | Status: AC
  Administered 2018-09-13: 10 mg via INTRAVENOUS
  Filled 2018-09-13: qty 1

## 2018-09-13 MED ORDER — SODIUM CHLORIDE 0.9 % IV SOLN
Freq: Once | INTRAVENOUS | Status: AC
  Administered 2018-09-13: 10:00:00 via INTRAVENOUS

## 2018-09-13 NOTE — Progress Notes (Signed)
1015 labs from 9/20 reviewed and patient approved for tx today per MD.  Constance Holster tolerated Carboplatin infusion without incident or complaint. VSS upon completion of treatment. 5FU infusing through port on home infusion pump without difficulties. Pt discharged self ambulatory in satisfactory condition.

## 2018-09-13 NOTE — Patient Instructions (Signed)
[facility] Discharge Instructions for Patients Receiving Chemotherapy   Beginning January 23rd 2017 lab work for the [facility] will be done in the  Main lab at [facility] on 1st floor. If you have a lab appointment with the [facility] please come in thru the  Main Entrance and check in at the main information desk   Today you received the following chemotherapy agents: Carboplatin and 5FU pump. Follow up as scheduled. Call clinic for any questions or concerns.   If you develop nausea and vomiting, or diarrhea that is not controlled by your medication, call the clinic.  The clinic phone number is [phone]. Office hours are Monday-Friday 8:30am-5:00pm.  BELOW ARE SYMPTOMS THAT SHOULD BE REPORTED IMMEDIATELY:  *FEVER GREATER THAN 101.0 F  *CHILLS WITH OR WITHOUT FEVER  NAUSEA AND VOMITING THAT IS NOT CONTROLLED WITH YOUR NAUSEA MEDICATION  *UNUSUAL SHORTNESS OF BREATH  *UNUSUAL BRUISING OR BLEEDING  TENDERNESS IN MOUTH AND THROAT WITH OR WITHOUT PRESENCE OF ULCERS  *URINARY PROBLEMS  *BOWEL PROBLEMS  UNUSUAL RASH Items with * indicate a potential emergency and should be followed up as soon as possible. If you have an emergency after office hours please contact your primary care physician or go to the nearest emergency department.  Please call the clinic during office hours if you have any questions or concerns.   You may also contact the Patient Navigator at [phone] should you have any questions or need assistance in obtaining follow up care.      Resources For Cancer Patients and their Caregivers ? American Cancer Society: Can assist with transportation, wigs, general needs, runs Look Good Feel Better.        ([phone] ? Cancer Care: Provides financial assistance, online support groups, medication/co-pay assistance.  1-800-813-HOPE 239-641-9148) ? [facility] Assists [place] cancer patients and their  families through emotional , educational and financial support.  704-857-4358 ? [place] DSS Where to apply for food stamps, Medicaid and utility assistance. 718-786-1149 ? [facility]: Transportation to medical appointments. (772) 808-0613 ? Social Security Administration: May apply for disability if have a Stage IV cancer. 917-861-9552 973-843-5058 ? [employer], Disability and Transit Services: Assists with nutrition, care and transit needs. 4130903182

## 2018-09-14 ENCOUNTER — Ambulatory Visit (HOSPITAL_COMMUNITY): Payer: Self-pay | Admitting: Internal Medicine

## 2018-09-14 ENCOUNTER — Inpatient Hospital Stay (HOSPITAL_COMMUNITY): Payer: Medicare Other

## 2018-09-14 ENCOUNTER — Ambulatory Visit (HOSPITAL_COMMUNITY): Payer: Self-pay

## 2018-09-14 VITALS — BP 118/72 | HR 80 | Temp 97.5°F | Resp 18 | Wt 155.0 lb

## 2018-09-14 DIAGNOSIS — C099 Malignant neoplasm of tonsil, unspecified: Secondary | ICD-10-CM

## 2018-09-14 MED ORDER — SODIUM CHLORIDE 0.9 % IV SOLN
10.0000 mg | Freq: Once | INTRAVENOUS | Status: AC
  Administered 2018-09-14: 10 mg via INTRAVENOUS
  Filled 2018-09-14: qty 1

## 2018-09-14 MED ORDER — SODIUM CHLORIDE 0.9 % IV SOLN
INTRAVENOUS | Status: DC
  Administered 2018-09-14: 14:00:00 via INTRAVENOUS

## 2018-09-14 MED ORDER — SODIUM CHLORIDE 0.9 % IV SOLN
70.0000 mg/m2 | Freq: Once | INTRAVENOUS | Status: AC
  Administered 2018-09-14: 140 mg via INTRAVENOUS
  Filled 2018-09-14: qty 14

## 2018-09-14 NOTE — Progress Notes (Signed)
Tolerated infusion w/o adverse reaction.  Alert, in no distress.  VSS.  Discharged ambulatory.  

## 2018-09-15 ENCOUNTER — Ambulatory Visit (HOSPITAL_COMMUNITY): Payer: Self-pay | Admitting: Hematology

## 2018-09-15 ENCOUNTER — Inpatient Hospital Stay (HOSPITAL_COMMUNITY): Payer: Medicare Other

## 2018-09-15 ENCOUNTER — Inpatient Hospital Stay (HOSPITAL_BASED_OUTPATIENT_CLINIC_OR_DEPARTMENT_OTHER): Payer: Medicare Other | Admitting: Hematology

## 2018-09-15 ENCOUNTER — Ambulatory Visit (HOSPITAL_COMMUNITY): Payer: Self-pay

## 2018-09-15 ENCOUNTER — Encounter (HOSPITAL_COMMUNITY): Payer: Self-pay | Admitting: Hematology

## 2018-09-15 VITALS — BP 113/54 | HR 79 | Temp 97.6°F | Resp 18

## 2018-09-15 VITALS — BP 127/57 | HR 74 | Temp 97.7°F | Resp 18 | Wt 157.1 lb

## 2018-09-15 DIAGNOSIS — C099 Malignant neoplasm of tonsil, unspecified: Secondary | ICD-10-CM | POA: Diagnosis not present

## 2018-09-15 DIAGNOSIS — R634 Abnormal weight loss: Secondary | ICD-10-CM | POA: Diagnosis not present

## 2018-09-15 DIAGNOSIS — Z5111 Encounter for antineoplastic chemotherapy: Secondary | ICD-10-CM | POA: Diagnosis not present

## 2018-09-15 DIAGNOSIS — K1230 Oral mucositis (ulcerative), unspecified: Secondary | ICD-10-CM

## 2018-09-15 DIAGNOSIS — Z79899 Other long term (current) drug therapy: Secondary | ICD-10-CM

## 2018-09-15 DIAGNOSIS — R918 Other nonspecific abnormal finding of lung field: Secondary | ICD-10-CM

## 2018-09-15 DIAGNOSIS — N189 Chronic kidney disease, unspecified: Secondary | ICD-10-CM

## 2018-09-15 DIAGNOSIS — E871 Hypo-osmolality and hyponatremia: Secondary | ICD-10-CM

## 2018-09-15 DIAGNOSIS — Z87891 Personal history of nicotine dependence: Secondary | ICD-10-CM

## 2018-09-15 DIAGNOSIS — E785 Hyperlipidemia, unspecified: Secondary | ICD-10-CM

## 2018-09-15 DIAGNOSIS — I700 Atherosclerosis of aorta: Secondary | ICD-10-CM

## 2018-09-15 DIAGNOSIS — I129 Hypertensive chronic kidney disease with stage 1 through stage 4 chronic kidney disease, or unspecified chronic kidney disease: Secondary | ICD-10-CM

## 2018-09-15 DIAGNOSIS — H9190 Unspecified hearing loss, unspecified ear: Secondary | ICD-10-CM | POA: Diagnosis not present

## 2018-09-15 DIAGNOSIS — F4310 Post-traumatic stress disorder, unspecified: Secondary | ICD-10-CM

## 2018-09-15 DIAGNOSIS — E43 Unspecified severe protein-calorie malnutrition: Secondary | ICD-10-CM

## 2018-09-15 MED ORDER — SODIUM CHLORIDE 0.9 % IV SOLN
10.0000 mg | Freq: Once | INTRAVENOUS | Status: AC
  Administered 2018-09-15: 10 mg via INTRAVENOUS
  Filled 2018-09-15: qty 1

## 2018-09-15 MED ORDER — SODIUM CHLORIDE 0.9% FLUSH: 10.0000 mL | INTRAVENOUS | Status: DC | PRN

## 2018-09-15 MED ORDER — SODIUM CHLORIDE 0.9 % IV SOLN
INTRAVENOUS | Status: DC
  Administered 2018-09-15: 13:00:00 via INTRAVENOUS

## 2018-09-15 MED ORDER — SODIUM CHLORIDE 0.9 % IV SOLN
52.5000 mg/m2 | Freq: Once | INTRAVENOUS | Status: AC
  Administered 2018-09-15: 100 mg via INTRAVENOUS
  Filled 2018-09-15: qty 10

## 2018-09-15 NOTE — Patient Instructions (Signed)
[facility] Discharge Instructions     Thank you for choosing [facility] at [facility] to provide your oncology and hematology care.  To afford each patient quality time with our provider, please arrive at least 15 minutes before your scheduled appointment time.   If you have a lab appointment with the Cancer Center please come in thru the  Main Entrance and check in at the main information desk  You need to re-schedule your appointment should you arrive 10 or more minutes late.  We strive to give you quality time with our providers, and arriving late affects you and other patients whose appointments are after yours.  Also, if you no show three or more times for appointments you may be dismissed from the clinic at the providers discretion.     Again, thank you for choosing [facility].  Our hope is that these requests will decrease the amount of time that you wait before being seen by our physicians.       _____________________________________________________________  Should you have questions after your visit to  Cancer Center, please contact our office at [phone] between the hours of 8:00 a.m. and 4:30 p.m.  Voicemails left after 4:00 p.m. will not be returned until the following business day.  For prescription refill requests, have your pharmacy contact our office and allow 72 hours.    Cancer Center Support Programs:   > Cancer Support Group  2nd Tuesday of the month 1pm-2pm, Journey Room    

## 2018-09-15 NOTE — Assessment & Plan Note (Signed)
1.  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 Benjamine Mola on 05/13/2018, status post biopsy consistent with squamous cell carcinoma, P 16+ -Weight loss of 16 pounds since the start of treatment. - Baseline hearing loss and chronic kidney disease with creatinine of 1.34. - Oropharyngeal examination shows a necrotic left tonsillar mass, not clearly visualized secondary to increased gag reflex.  There is a left lower neck lymph node palpable.  I discussed the results of the PET CT scan with the patient which showed level 2, 3, 5 meta stasis on the left neck and mildly metabolic right level 2 lymph node along with the left tonsillar primary.  No other hypermetabolic lesion seen. -I have recommended combination chemoradiation therapy.  He is not a candidate for high-dose cisplatin given his chronic kidney disease and hearing dysfunction.  He is also not a candidate for weekly cisplatin as his creatinine might get worse.  I have recommended 94-01 [place] protocol with 5-FU administered as a 24-hour continuous infusion at a dose of 600 mg per M square per day for 4 days and carboplatin given as a daily bolus of 70 mg/m2/day for 4 days.  Chemo will be given on days 1, 22 and 43.  We will have a port placed by 1 of our surgeons. - He received first cycle of carboplatin and 5-FU on 07/13/2018. - He developed severe mucositis after cycle 1 and lost about 15 pounds. - Had a PEG tube placed on 08/04/2018.  He is taking in 3 cans of Osmolite as bolus.  He is also drinking about 2 cans of Ensure per day.  He is not able to eat any soft foods.  He does not have any severe pain while swallowing. - He received chemotherapy cycle 2 on 08/10/2018.  He developed mucositis again. - He was hospitalized after cycle 2 with severe mucositis and neutropenic fever. -He was started on cycle 3 on 09/13/2018.  He finished radiation  therapy about 2 weeks ago.  Because of his severe mucositis and neutropenia, I have recommended cutting back on the dose of carboplatin by 25% for the next 3 doses. -Will monitor his blood counts every week.  I will see him back in 2 weeks for follow-up.

## 2018-09-15 NOTE — Progress Notes (Signed)
[place] [address], [place] [address]   CLINIC:  Medical Oncology/Hematology  PCP:  [name], DO [place] [address] [phone]   REASON FOR VISIT: Follow-up for tonsillar cancer  CURRENT THERAPY: carboplatin and 5FU  BRIEF ONCOLOGIC HISTORY:    Tonsillar cancer ([place])   06/02/2018 Initial Diagnosis    Tonsillar cancer ([place] [facility] [place])    06/15/2018 -  Chemotherapy    The patient had palonosetron (ALOXI) injection 0.25 mg, 0.25 mg, Intravenous,  Once, 3 of 3 cycles Administration: 0.25 mg (07/13/2018), 0.25 mg (07/15/2018), 0.25 mg (08/10/2018), 0.25 mg (09/13/2018) CARBOplatin (PARAPLATIN) 140 mg in sodium chloride 0.9 % 100 mL chemo infusion, 70 mg/m2 = 140 mg (100 % of original dose 70 mg/m2), Intravenous,  Once, 3 of 3 cycles Dose modification: 70 mg/m2 (original dose 70 mg/m2, Cycle 1, Reason: Other (see comments), Comment: french protocol head and neck), 52.5 mg/m2 (75 % of original dose 70 mg/m2, Cycle 3, Reason: Provider Judgment) Administration: 140 mg (07/13/2018), 140 mg (07/14/2018), 140 mg (07/15/2018), 140 mg (07/19/2018), 140 mg (08/10/2018), 140 mg (08/11/2018), 140 mg (08/12/2018), 140 mg (08/13/2018), 140 mg (09/13/2018), 140 mg (09/14/2018), 100 mg (09/15/2018) fluorouracil (ADRUCIL) 4,750 mg in sodium chloride 0.9 % 55 mL chemo infusion, 600 mg/m2/day = 4,750 mg (100 % of original dose 600 mg/m2/day), Intravenous, 4D (96 hours ), 3 of 3 cycles Dose modification: 600 mg/m2/day (original dose 600 mg/m2/day, Cycle 1, Reason: Other (see comments), Comment: french protocol head and neck) Administration: 4,750 mg (07/13/2018), 4,750 mg (08/10/2018), 4,750 mg (09/13/2018)  for chemotherapy treatment.       CANCER STAGING: Cancer Staging Tonsillar cancer ([place]) Staging form: Pharynx - HPV-Mediated Oropharynx, AJCC 8th Edition - Clinical stage from 06/02/2018: Stage II (cT3, cN2, cM0) - Unsigned    INTERVAL HISTORY:  Mr. Colborn 66 y.o.  male returns for routine follow-up for tonsillar cancer and consideration for next cycle of chemotherapy. Patient is doing better since his visit last week. He has been using his mouth wash 4 times a day and his sores have improved. He is still taking in 4 cans a day tube feeding. He is maintaining his weight at this time. He denies any nausea, vomiting, or diarrhea. Denies any numbness or tingling in his hands or feet. Denies any new pain.      REVIEW OF SYSTEMS:  Review of Systems  Constitutional: Positive for fatigue.  HENT:   Positive for mouth sores and trouble swallowing.   All other systems reviewed and are negative.    PAST MEDICAL/SURGICAL HISTORY:  Past Medical History:  Diagnosis Date  . HOH (hard of hearing)   . Hyperlipidemia   . Mass of oral cavity 05/12/2018  . Positive FIT (fecal immunochemical test) 05/12/2018  . PTSD (post-traumatic stress disorder)    has not been offically diagnosised  . Tonsillar cancer ([facility]) 06/02/2018   Past Surgical History:  Procedure Laterality Date  . APPENDECTOMY  1969  . COLONOSCOPY W/ POLYPECTOMY     remote past  . ESOPHAGOGASTRODUODENOSCOPY  08/04/2018   Dr. Arnoldo Morale: erythema of larynx, normal stomach, normal duodenum. PEG Placed 20 F, bumper at 2.5 cm  . ESOPHAGOGASTRODUODENOSCOPY (EGD) WITH PROPOFOL N/A 08/04/2018   Procedure: ESOPHAGOGASTRODUODENOSCOPY (EGD) WITH PROPOFOL;  Surgeon: Aviva Signs, MD;  [facility];  Service: Gastroenterology;  Laterality: N/A;  . FINGER FRACTURE SURGERY Left   . MULTIPLE EXTRACTIONS WITH ALVEOLOPLASTY N/A 06/15/2018   Procedure: Extraction of tooth #'s  2,12,13,18,20,and 21 with alveoloplasty and gross debridement of remaining teeth;  Surgeon: Lenn Cal, DDS;  Location: [facility];  Service: Oral Surgery;  Laterality: N/A;  . PEG PLACEMENT N/A 08/04/2018   Procedure: PERCUTANEOUS ENDOSCOPIC GASTROSTOMY (PEG) PLACEMENT;  Surgeon: Aviva Signs, MD;  [facility];  Service:  Gastroenterology;  Laterality: N/A;  . PORTACATH PLACEMENT Left 07/02/2018   Procedure: INSERTION PORT-A-CATH;  Surgeon: Aviva Signs, MD;  Location: [facility];  Service: General;  Laterality: Left;     SOCIAL HISTORY:  Social History   Socioeconomic History  . Marital status: Divorced    Spouse name: Not on file  . Number of children: Not on file  . Years of education: Not on file  . Highest education level: Not on file  Occupational History  . Occupation: English as a second language teacher, retired  Scientific laboratory technician  . Financial resource strain: Not on file  . Food insecurity:    Worry: Not on file    Inability: Not on file  . Transportation needs:    Medical: Not on file    Non-medical: Not on file  Tobacco Use  . Smoking status: Former Smoker    Years: 15.00    Types: Cigarettes    Last attempt to quit: 2000    Years since quitting: 19.7  . Smokeless tobacco: Never Used  Substance and Sexual Activity  . Alcohol use: Yes    Alcohol/week: 12.0 standard drinks    Types: 12 Cans of beer per week  . Drug use: Never  . Sexual activity: Not Currently  Lifestyle  . Physical activity:    Days per week: Not on file    Minutes per session: Not on file  . Stress: Not on file  Relationships  . Social connections:    Talks on phone: Not on file    Gets together: Not on file    Attends religious service: Not on file    Active member of club or organization: Not on file    Attends meetings of clubs or organizations: Not on file    Relationship status: Not on file  . Intimate partner violence:    Fear of current or ex partner: Not on file    Emotionally abused: Not on file    Physically abused: Not on file    Forced sexual activity: Not on file  Other Topics Concern  . Not on file  Social History Narrative   Mr. Heiny is a pleasant gentleman who is a retired English as a second language teacher.  He lives independently with his dog here in [place].  He formally lived in [place].    FAMILY HISTORY:  Family History  Problem  Relation Age of Onset  . Diabetes Father        died at age 38   . Colon cancer Neg Hx     CURRENT MEDICATIONS:  Outpatient Encounter Medications as of 09/15/2018  Medication Sig  . Amino Acids-Protein Hydrolys (FEEDING SUPPLEMENT, PRO-STAT SUGAR FREE 64,) LIQD Place 30 mLs into feeding tube daily.  Marland Kitchen ibuprofen (ADVIL,MOTRIN) 200 MG tablet Take 400 mg by mouth every 6 (six) hours as needed for moderate pain.   Marland Kitchen lidocaine (XYLOCAINE) 2 % solution Viscous lidocaine 2%/ Maalox 1:1 mixture. Swish and swallow 1 tablespoon four times a day100 (Patient taking differently: Use as directed 15 mLs in the mouth or throat 4 (four) times daily. Viscous lidocaine 2%/ Maalox 1:1 mixture. Swish and swallow 1 tablespoon four times a day100)  . lidocaine-prilocaine (EMLA) cream Apply  to affected area once (Patient taking differently: Apply 1 application topically as directed. Apply to affected area once)  . Morphine Sulfate (MORPHINE CONCENTRATE) 10 mg / 0.5 ml concentrated solution Take 0.5 mLs (10 mg total) by mouth every 3 (three) hours as needed for severe pain.  . Nutritional Supplements (FEEDING SUPPLEMENT, OSMOLITE 1.5 CAL,) LIQD Place 237 mLs into feeding tube 2 (two) times daily.  Marland Kitchen oxyCODONE-acetaminophen (PERCOCET) 5-325 MG tablet Take one tablet by mouth every 4 hours as needed for pain.  Marland Kitchen prochlorperazine (COMPAZINE) 10 MG tablet Take 1 tablet (10 mg total) by mouth every 6 (six) hours as needed (Nausea or vomiting).  . sodium fluoride (FLUORISHIELD) 1.1 % GEL dental gel Instill one drop of gel per tooth space of fluoride tray. Place over teeth for 5 minutes. Remove. Spit out excess. Repeat nightly. (Patient taking differently: Place 1 drop onto teeth See admin instructions. Instill one drop of gel per tooth space of fluoride tray. Place over teeth for 5 minutes. Remove. Spit out excess. Repeat nightly.)  . sucralfate (CARAFATE) 1 GM/10ML suspension Carafate 1gm/72ml & Viscous lidocaine 2% 1:1 mixture.   Swish and swallow 1 tablespoon four times a day. (Patient taking differently: Take by mouth 4 (four) times daily. Carafate 1gm/81ml & Viscous lidocaine 2% 1:1 mixture.  Swish and swallow 1 tablespoon four times a day.)   Facility-Administered Encounter Medications as of 09/15/2018  Medication  . 0.9 %  sodium chloride infusion  . [COMPLETED] CARBOplatin (PARAPLATIN) 100 mg in sodium chloride 0.9 % 100 mL chemo infusion  . [COMPLETED] dexamethasone (DECADRON) 10 mg in sodium chloride 0.9 % 50 mL IVPB  . sodium chloride flush (NS) 0.9 % injection 10 mL    ALLERGIES:  No Known Allergies   PHYSICAL EXAM:  ECOG Performance status: 1  Vitals:   09/15/18 1148  BP: (!) 127/57  Pulse: 74  Resp: 18  Temp: 97.7 F (36.5 C)  SpO2: 100%   Filed Weights   09/15/18 1148  Weight: 157 lb 1.6 oz (71.3 kg)    Physical Exam  Constitutional: He is oriented to person, place, and time. He appears well-developed and well-nourished.  HENT:  Mouth sores, excessive salvia   Cardiovascular: Normal rate, regular rhythm and normal heart sounds.  Pulmonary/Chest: Effort normal and breath sounds normal.  Musculoskeletal: Normal range of motion.  Neurological: He is alert and oriented to person, place, and time.  Skin: Skin is warm and dry.  Psychiatric: He has a normal mood and affect. His behavior is normal. Judgment and thought content normal.     LABORATORY DATA:  I have reviewed the labs as listed.  CBC    Component Value Date/Time   WBC 3.0 (L) 09/10/2018 1050   RBC 3.34 (L) 09/10/2018 1050   HGB 10.5 (L) 09/10/2018 1050   HGB 14.2 05/12/2018 0949   HCT 33.5 (L) 09/10/2018 1050   HCT 42.0 05/12/2018 0949   PLT 278 09/10/2018 1050   PLT 256 05/12/2018 0949   MCV 100.3 (H) 09/10/2018 1050   MCV 87 05/12/2018 0949   MCH 31.4 09/10/2018 1050   MCHC 31.3 09/10/2018 1050   RDW 18.4 (H) 09/10/2018 1050   RDW 13.0 05/12/2018 0949   LYMPHSABS 0.5 (L) 09/10/2018 1050   LYMPHSABS 1.1  05/12/2018 0949   MONOABS 0.6 09/10/2018 1050   EOSABS 0.0 09/10/2018 1050   EOSABS 0.1 05/12/2018 0949   BASOSABS 0.0 09/10/2018 1050   BASOSABS 0.0 05/12/2018 0949   CMP Latest Ref  Rng & Units 09/10/2018 08/29/2018 08/28/2018  Glucose 70 - 99 mg/dL 126(H) 122(H) 146(H)  BUN 8 - 23 mg/dL 13 8 10   Creatinine 0.61 - 1.24 mg/dL 0.73 0.57(L) 0.62  Sodium 135 - 145 mmol/L 140 137 136  Potassium 3.5 - 5.1 mmol/L 3.7 3.8 3.6  Chloride 98 - 111 mmol/L 100 103 104  CO2 22 - 32 mmol/L 33(H) 28 28  Calcium 8.9 - 10.3 mg/dL 9.0 7.5(L) 7.4(L)  Total Protein 6.5 - 8.1 g/dL 6.9 - -  Total Bilirubin 0.3 - 1.2 mg/dL 0.7 - -  Alkaline Phos 38 - 126 U/L 58 - -  AST 15 - 41 U/L 18 - -  ALT 0 - 44 U/L 13 - -         ASSESSMENT & PLAN:   Tonsillar cancer ([facility]) 1.  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 Benjamine Mola on 05/13/2018, status post biopsy consistent with squamous cell carcinoma, P 16+ -Weight loss of 16 pounds since the start of treatment. - Baseline hearing loss and chronic kidney disease with creatinine of 1.34. - Oropharyngeal examination shows a necrotic left tonsillar mass, not clearly visualized secondary to increased gag reflex.  There is a left lower neck lymph node palpable.  I discussed the results of the PET CT scan with the patient which showed level 2, 3, 5 meta stasis on the left neck and mildly metabolic right level 2 lymph node along with the left tonsillar primary.  No other hypermetabolic lesion seen. -I have recommended combination chemoradiation therapy.  He is not a candidate for high-dose cisplatin given his chronic kidney disease and hearing dysfunction.  He is also not a candidate for weekly cisplatin as his creatinine might get worse.  I have recommended 94-01 [place] protocol with 5-FU administered as a 24-hour continuous infusion at a dose of 600 mg per M  square per day for 4 days and carboplatin given as a daily bolus of 70 mg/m2/day for 4 days.  Chemo will be given on days 1, 22 and 43.  We will have a port placed by 1 of our surgeons. - He received first cycle of carboplatin and 5-FU on 07/13/2018. - He developed severe mucositis after cycle 1 and lost about 15 pounds. - Had a PEG tube placed on 08/04/2018.  He is taking in 3 cans of Osmolite as bolus.  He is also drinking about 2 cans of Ensure per day.  He is not able to eat any soft foods.  He does not have any severe pain while swallowing. - He received chemotherapy cycle 2 on 08/10/2018.  He developed mucositis again. - He was hospitalized after cycle 2 with severe mucositis and neutropenic fever. -He was started on cycle 3 on 09/13/2018.  He finished radiation therapy about 2 weeks ago.  Because of his severe mucositis and neutropenia, I have recommended cutting back on the dose of carboplatin by 25% for the next 3 doses. -Will monitor his blood counts every week.  I will see him back in 2 weeks for follow-up.      Orders placed this encounter:  Orders Placed This Encounter  Procedures  . Magnesium  . CBC with Differential/Platelet  . Comprehensive metabolic panel  . Lactate dehydrogenase  . Magnesium  . CBC with Differential/Platelet  . Comprehensive metabolic panel  . Lactate dehydrogenase      Derek Jack, MD Deneise Lever  [place] [phone]

## 2018-09-15 NOTE — Progress Notes (Signed)
Tolerated infusion w/o adverse reaction.  Alert, in no distress.  VSS.  Discharged ambulatory.  

## 2018-09-16 ENCOUNTER — Inpatient Hospital Stay (HOSPITAL_COMMUNITY): Payer: Medicare Other

## 2018-09-16 ENCOUNTER — Encounter: Payer: Self-pay | Admitting: Dietician

## 2018-09-16 ENCOUNTER — Encounter (HOSPITAL_COMMUNITY): Payer: Self-pay

## 2018-09-16 ENCOUNTER — Ambulatory Visit (HOSPITAL_COMMUNITY): Payer: Self-pay

## 2018-09-16 VITALS — BP 115/59 | HR 78 | Temp 97.8°F | Resp 18 | Wt 156.8 lb

## 2018-09-16 DIAGNOSIS — C099 Malignant neoplasm of tonsil, unspecified: Secondary | ICD-10-CM | POA: Diagnosis not present

## 2018-09-16 MED ORDER — SODIUM CHLORIDE 0.9 % IV SOLN
52.5000 mg/m2 | Freq: Once | INTRAVENOUS | Status: AC
  Administered 2018-09-16: 100 mg via INTRAVENOUS
  Filled 2018-09-16: qty 10

## 2018-09-16 MED ORDER — SODIUM CHLORIDE 0.9 % IV SOLN
10.0000 mg | Freq: Once | INTRAVENOUS | Status: AC
  Administered 2018-09-16: 10 mg via INTRAVENOUS
  Filled 2018-09-16: qty 1

## 2018-09-16 MED ORDER — SODIUM CHLORIDE 0.9% FLUSH
10.0000 mL | INTRAVENOUS | Status: DC | PRN
  Administered 2018-09-16: 10 mL
  Filled 2018-09-16: qty 10

## 2018-09-16 MED ORDER — SODIUM CHLORIDE 0.9 % IV SOLN
INTRAVENOUS | Status: DC
  Administered 2018-09-16: 14:00:00 via INTRAVENOUS

## 2018-09-16 NOTE — Progress Notes (Signed)
Nutrition Follow-up 67 y/o male PMHx HLD, HOH, CKD, w/ little recent medical follow up. Presented to [facility] 6/12 after referred by local ENT MD for large L tonsillar mass s/p biopsy. + for SCC. At time of presentation, had dysphagia and 5 lb loss x1 month. Begun radiation 7/22 and chemo 7/23. Developed severe mucositis, dysphagia and odynophagia and was down ~16 lbs 1 month after starting therapy. S/P peg placement 8/14 and start of Tube feeding 8/16. Presented to ED 9/3 d/t fever.. Admitted 9/3-9/8 due to neutropenic fever. Tx held x2 weeks. Chemo Treatment resumed 9/23. Completed radiation approximately 2 weeks ago.  Patient being seen for the first time since he was admitted to hospital 3 weeks ago. At that time, pt had stated he was not going to intake oral intake anymore. RD instructed to comply with following TF instructions to meet 100% of estimated needs:   6 cans of osmolite 1.5 w/ 60 cc flush before and after and 240 cc free water bolus TID.   Today, on interview, pt states he is still not taking any oral intake. He says "my swallow muscles are not coordinated". He says he practices swallowing and will gargle with liquids, but he typically ends up choking on it. As such, he correctly decided it would be a poor choice to try to take oral intake at this time. He does try to do his swallow exercises, but it hurts to try to expand his mouth too much. He is still having a large amount of secretions and they drip out of his mouth throughout our conversation. He says home suction has been a "life saver".   He is receiving 100% of needs via PEG. He had modified his tube feeding regimen. Though he had been asked to do 1.5 cans Osmolite  QID on d/c from hospital, he apparently had started infusing 2 cans TID. He says the two cans at one time caused him discomfort in throat and chest. He says he started substituting out 1 can of Osmolite 1.5 for 1 can of Ensure. He tolerates this much better. Denies n/v/c. He  says his bms are more "liquid-like" and filled with gas. He has concerns the feeding solution is getting harder to instill through PEG.   Was difficult to discern exactly what his flushing regimen is. He says he believes he gets an equivalent of 3 bottles of water in flushes; unfortunately, he does not know the size of these bottles. Though judging by his hand measurements, they look to be the 16.9 oz bottles. It sounds that this amount includes his maintenance flushes. He says "I Try to get as much water as I can". He says his urine is not dark, it is a light yellow- "the color of Osmolite or ensure". Clarified later it is lighter than this.   His reported feeding regimen is: 1 can osmolite 1.5 + 1 bottle Ensure, three times per day. + 3 water bottles worth of flushes (16.9 oz?).   This is providing him:  2115 kcals, 104.7g Pro, 1083 mls +1521 from flushes.   Wt has been stable at 155-158 since d/c from hospital.   Labs are unremarkable.   He asks for another case of Ensure and Osmolite.   Wt Readings from Last 10 Encounters:  09/15/18 157 lb 1.6 oz (71.3 kg)  09/14/18 155 lb (70.3 kg)  09/13/18 154 lb 12.8 oz (70.2 kg)  09/10/18 158 lb 6.4 oz (71.8 kg)  08/29/18 154 lb 8 oz (70.1 kg)  08/18/18 157 lb 4.8 oz (71.4 kg)  08/13/18 161 lb 9.6 oz (73.3 kg)  08/12/18 159 lb 9.6 oz (72.4 kg)  08/11/18 159 lb (72.1 kg)  08/09/18 157 lb 8 oz (71.4 kg)   MEDICATIONS: Chemo: Carboplatin Other Meds: Liquid morphine, Percocet, Carafate, Compazine  LABS:  9/20: lab work: Largely unremarkable. Renal labs WDL, Albumin stable at 3.4. Hgb 8.8->10.5 x 2 weeks  Recent Labs  Lab 09/10/18 1050  NA 140  K 3.7  CL 100  CO2 33*  BUN 13  CREATININE 0.73  CALCIUM 9.0  MG 2.3  GLUCOSE 126*   ANTHROPOMETRICS: Height:  Ht Readings from Last 1 Encounters:  08/24/18 _0  (1.727 m)   Weight:  Wt Readings from Last 1 Encounters:  09/15/18 157 lb 1.6 oz (71.3 kg)   BMI:  BMI Readings from  Last 1 Encounters:  09/15/18 23.89 kg/m   UBW: 182 when presenting In June IBW: 70 kg Wt change: Stable x3 weeks at 155-158 lbs   Re-Estimated needs:  Energy: 2000-2150 kcals (28-30 kcal/kg bw) Protein:85-100g Pro (1.3-1.4 g/kg bw) Fluid: >2.1  L fluid (30 ml/kcal)   NUTRITION DIAGNOSIS:  Inadequate oral intake related to side effects of chemoradiation as patient report of minimal PO intake and need for PEG   DOCUMENTATION CODES:  N/A - malnutrition resolved with weight stability and meeting needs for ~1 month  INTERVENTION:  Though his TF regimen is not what had been recommended by RD, it meets his estimated requirements and he has maintained his weight since discharged from hospital. No modifications made to his feeding regimen today.    RD provided patient additional case of Ensure as well as a gifted case of Osmolite 1.5   His throat pain is slowly improving, but He is still not eating. He has significant secretions and dysphagia.   He surely warrants a speech consult. He has minimal range of motion with his jaw and reports his swallow is "uncoordinated". Unfortunately, pt has declined ST f/u in past due to inability to pay copay. RD communicated his concerns to SLP. She noted he may need an MBS.  GOAL:  Patient will meet greater than or equal to 90% of their needs  MET  MONITOR:  Labs, Weight trends, TF tolerance, I & O's, PO intake, swallow function  Next Visit: Will be present when patient rings victory bell tomorrow. Will follow up via phone in 2-3 weeks.   [name] RD, LDN, CNSC Clinical Nutrition Available Tues-Sat via Pager: [phone] 09/16/2018 1:36 PM

## 2018-09-16 NOTE — Progress Notes (Signed)
Patient tolerated chemotherapy with no complaints voiced.  Chemo pump infusing with no alarms.  To return tomorrow for pump disconnect.  Port site clean and dry with no bruising or swelling noted at site.  VSS with discharge and left ambulatory with no s/s of distress noted.

## 2018-09-16 NOTE — Patient Instructions (Signed)
[facility] Discharge Instructions for Patients Receiving Chemotherapy  Today you received the following chemotherapy agents carboplatin.    If you develop nausea and vomiting that is not controlled by your nausea medication, call the clinic.   BELOW ARE SYMPTOMS THAT SHOULD BE REPORTED IMMEDIATELY:  *FEVER GREATER THAN 100.5 F  *CHILLS WITH OR WITHOUT FEVER  NAUSEA AND VOMITING THAT IS NOT CONTROLLED WITH YOUR NAUSEA MEDICATION  *UNUSUAL SHORTNESS OF BREATH  *UNUSUAL BRUISING OR BLEEDING  TENDERNESS IN MOUTH AND THROAT WITH OR WITHOUT PRESENCE OF ULCERS  *URINARY PROBLEMS  *BOWEL PROBLEMS  UNUSUAL RASH Items with * indicate a potential emergency and should be followed up as soon as possible.  Feel free to call the clinic should you have any questions or concerns. The clinic phone number is [phone].  Please show the [facility] at check-in to the Emergency Department and triage nurse.

## 2018-09-17 ENCOUNTER — Inpatient Hospital Stay (HOSPITAL_COMMUNITY): Payer: Medicare Other

## 2018-09-17 ENCOUNTER — Encounter (HOSPITAL_COMMUNITY): Payer: Self-pay

## 2018-09-17 VITALS — BP 133/67 | HR 82 | Temp 97.8°F | Resp 18 | Wt 157.0 lb

## 2018-09-17 DIAGNOSIS — C099 Malignant neoplasm of tonsil, unspecified: Secondary | ICD-10-CM | POA: Diagnosis not present

## 2018-09-17 MED ORDER — SODIUM CHLORIDE 0.9% FLUSH
10.0000 mL | INTRAVENOUS | Status: DC | PRN
  Administered 2018-09-17: 10 mL
  Filled 2018-09-17: qty 10

## 2018-09-17 MED ORDER — HEPARIN SOD (PORK) LOCK FLUSH 100 UNIT/ML IV SOLN
500.0000 [IU] | Freq: Once | INTRAVENOUS | Status: AC | PRN
  Administered 2018-09-17: 500 [IU]

## 2018-09-19 IMAGING — US US ABDOMEN LIMITED
1 series · 14 of 25 positions shown · non-contrast
Comparison: PET-CT 05/31/2018

CLINICAL DATA: Increased bilirubin.

EXAM:
ULTRASOUND ABDOMEN LIMITED RIGHT UPPER QUADRANT

[Series 1: us abdomen limited · 0.15mm/px · 14 of 36 slices shown]
[im 1/36]
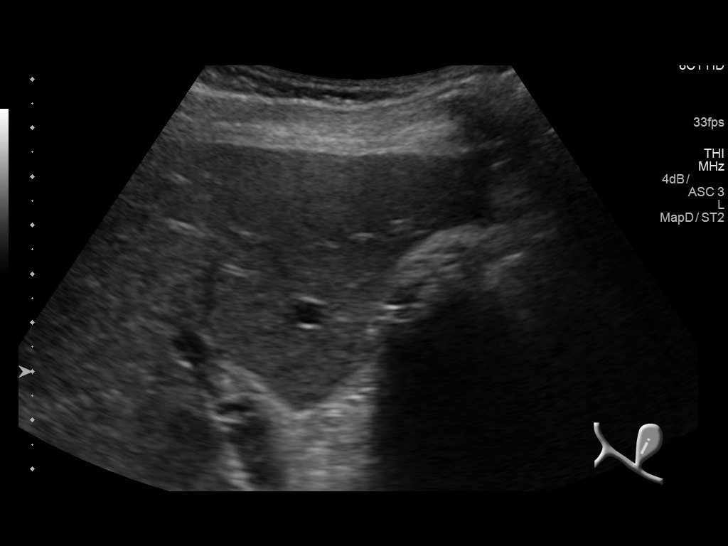
[im 3/36]
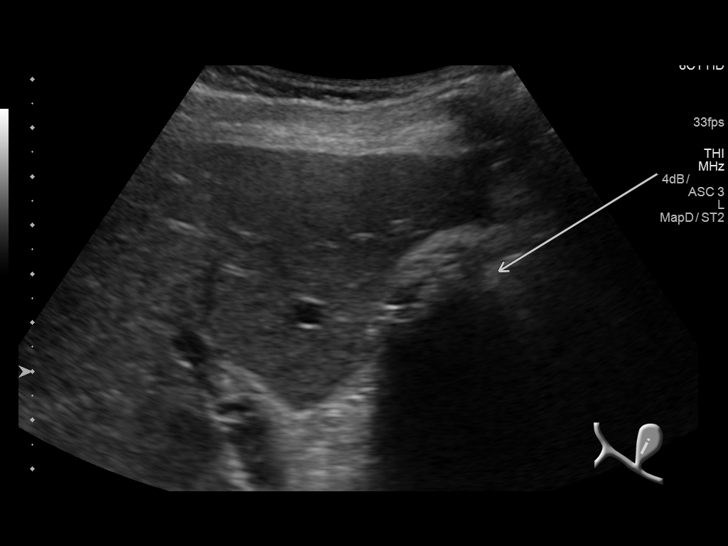
[im 6/36]
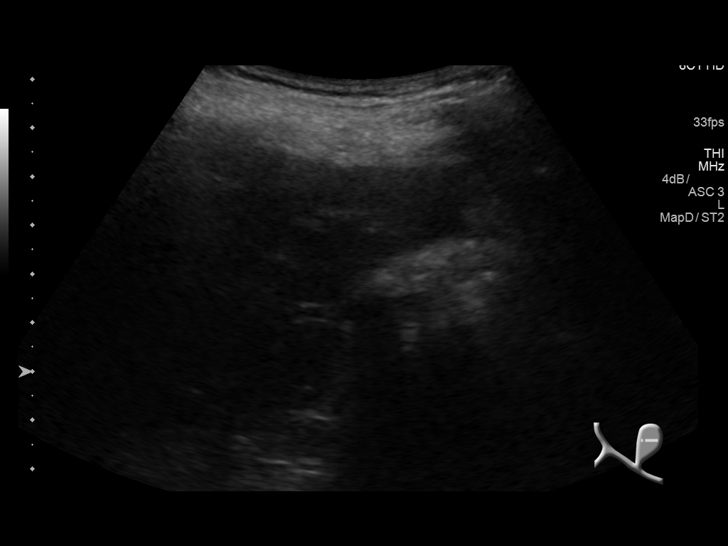
[im 9/36]
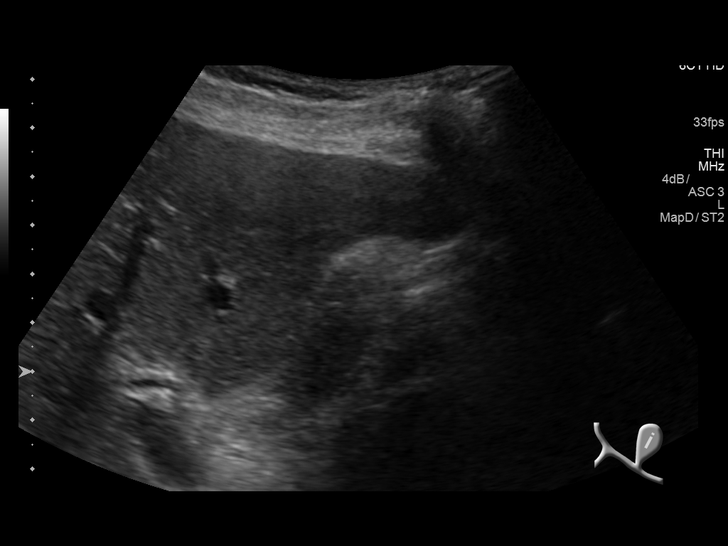
[im 12/36]
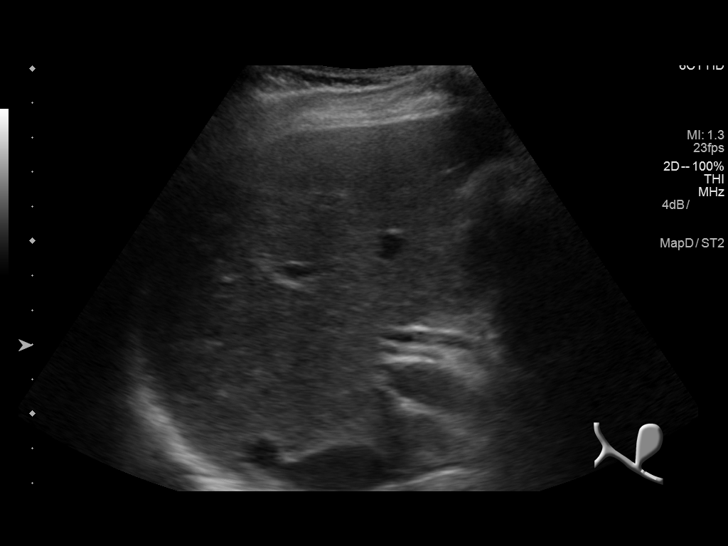
[im 14/36]
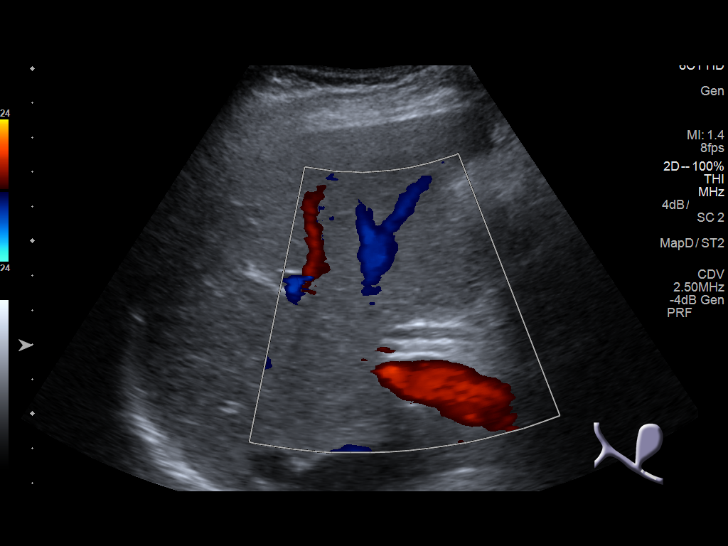
[im 17/36]
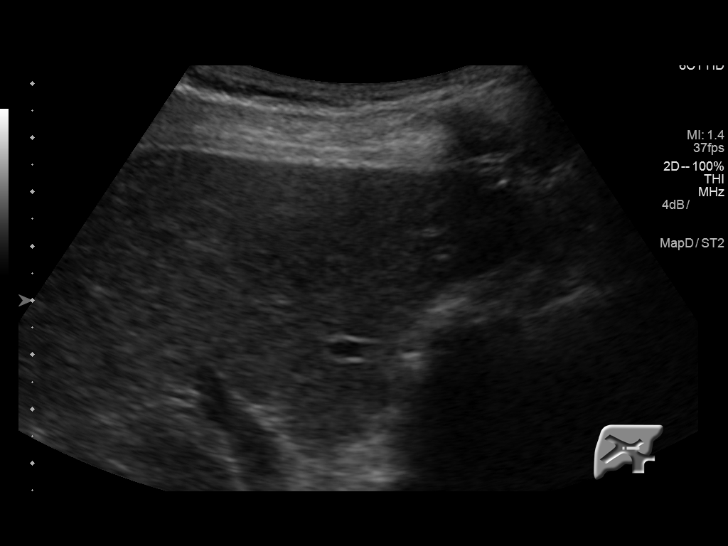
[im 19/36]
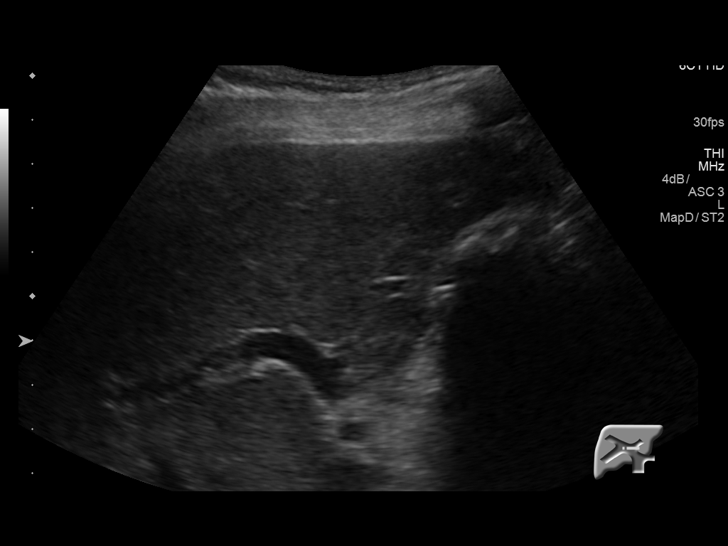
[im 22/36]
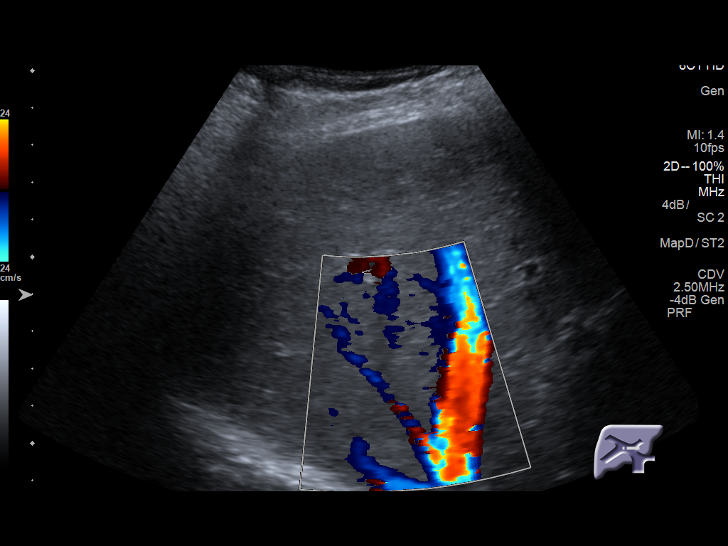
[im 24/36]
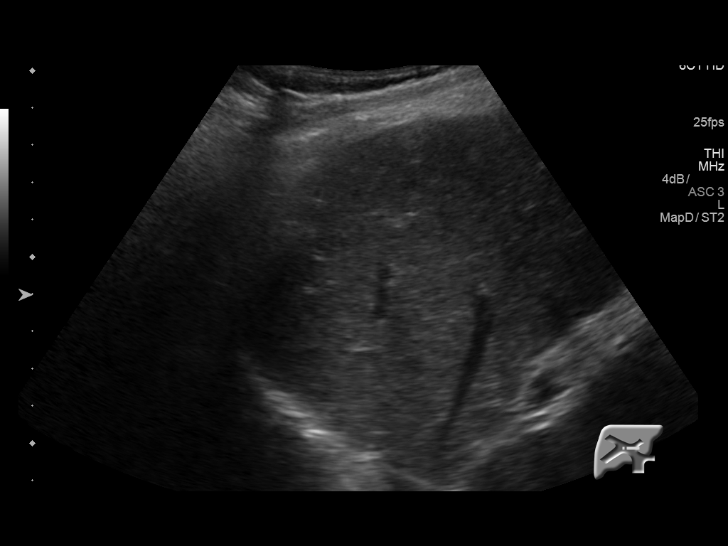
[im 27/36]
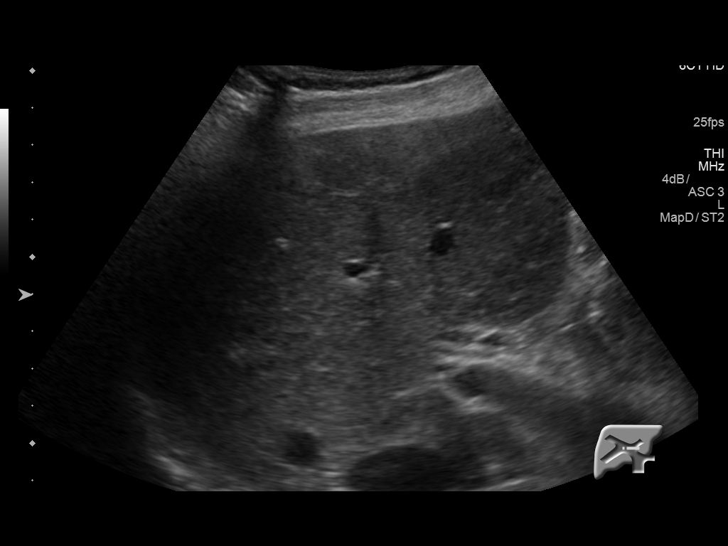
[im 30/36]
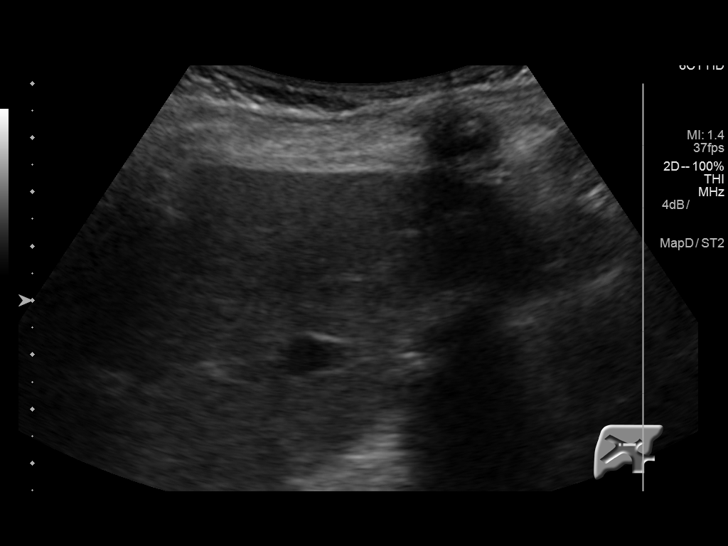
[im 33/36]
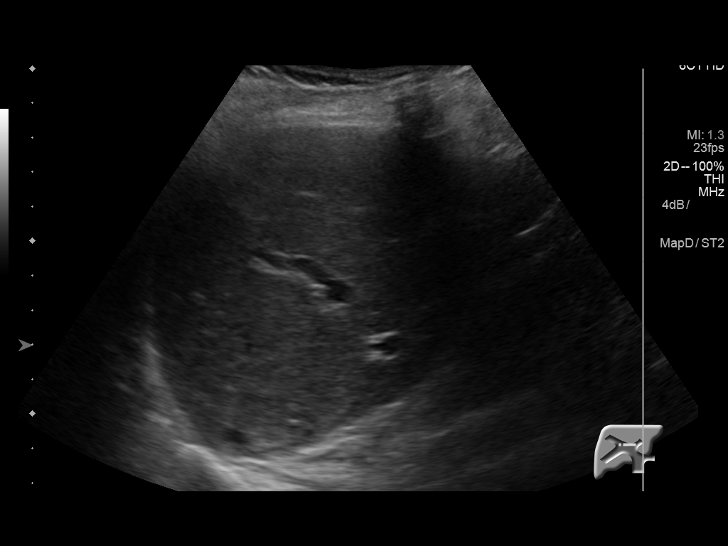
[im 36/36]
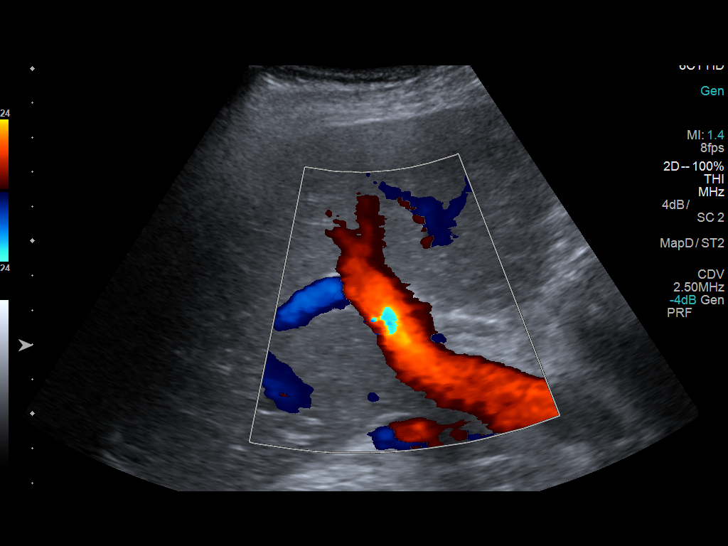

[14 of 25 positions shown; findings below may reference images not displayed]

FINDINGS: Gallbladder:

The gallbladder is incompletely distended and contains multiple
calculi, the largest measuring 8 mm. The posterior wall of the
gallbladder is obscured by dense shadowing from the present calculi.
Wall thickness measures 3.4 mm. The sonographic Murphy's sign is
recorded is negative.

Common bile duct:

Diameter: 3.9 mm

Liver:

No focal lesion identified. Within normal limits in parenchymal
echogenicity. Portal vein is patent on color Doppler imaging with
normal direction of blood flow towards the liver.
IMPRESSION: Marked cholelithiasis. Equivocal borderline thickening of the
gallbladder wall, given the gallbladder is not completely distended.
Correlation with HIDA scan or repeated fasting right upper quadrant
ultrasound may be considered if found clinically necessary.

Normal appearance of the liver.

## 2018-09-20 ENCOUNTER — Other Ambulatory Visit (HOSPITAL_COMMUNITY): Payer: Self-pay

## 2018-09-27 ENCOUNTER — Inpatient Hospital Stay (HOSPITAL_COMMUNITY): Payer: Medicare Other

## 2018-09-27 ENCOUNTER — Encounter (HOSPITAL_COMMUNITY): Payer: Self-pay | Admitting: Hematology

## 2018-09-27 ENCOUNTER — Inpatient Hospital Stay (HOSPITAL_COMMUNITY): Payer: Medicare Other | Attending: Hematology | Admitting: Hematology

## 2018-09-27 VITALS — BP 122/56 | HR 77 | Temp 97.9°F | Resp 18 | Wt 151.0 lb

## 2018-09-27 DIAGNOSIS — R1310 Dysphagia, unspecified: Secondary | ICD-10-CM | POA: Insufficient documentation

## 2018-09-27 DIAGNOSIS — Z79899 Other long term (current) drug therapy: Secondary | ICD-10-CM | POA: Diagnosis not present

## 2018-09-27 DIAGNOSIS — Z923 Personal history of irradiation: Secondary | ICD-10-CM | POA: Insufficient documentation

## 2018-09-27 DIAGNOSIS — C099 Malignant neoplasm of tonsil, unspecified: Secondary | ICD-10-CM

## 2018-09-27 DIAGNOSIS — Z87891 Personal history of nicotine dependence: Secondary | ICD-10-CM | POA: Diagnosis not present

## 2018-09-27 DIAGNOSIS — Z9221 Personal history of antineoplastic chemotherapy: Secondary | ICD-10-CM | POA: Diagnosis not present

## 2018-09-27 LAB — CBC WITH DIFFERENTIAL/PLATELET
BASOS ABS: 0 10*3/uL (ref 0.0–0.1)
Basophils Relative: 0 %
EOS ABS: 0.2 10*3/uL (ref 0.0–0.7)
Eosinophils Relative: 10 %
HCT: 31.9 % — ABNORMAL LOW (ref 39.0–52.0)
Hemoglobin: 10.2 g/dL — ABNORMAL LOW (ref 13.0–17.0)
Lymphocytes Relative: 15 %
Lymphs Abs: 0.4 10*3/uL — ABNORMAL LOW (ref 0.7–4.0)
MCH: 31.9 pg (ref 26.0–34.0)
MCHC: 32 g/dL (ref 30.0–36.0)
MCV: 99.7 fL (ref 78.0–100.0)
Monocytes Absolute: 0.3 10*3/uL (ref 0.1–1.0)
Monocytes Relative: 13 %
Neutro Abs: 1.5 10*3/uL — ABNORMAL LOW (ref 1.7–7.7)
Neutrophils Relative %: 62 %
PLATELETS: 112 10*3/uL — AB (ref 150–400)
RBC: 3.2 MIL/uL — AB (ref 4.22–5.81)
RDW: 16.1 % — ABNORMAL HIGH (ref 11.5–15.5)
WBC: 2.5 10*3/uL — AB (ref 4.0–10.5)

## 2018-09-27 LAB — COMPREHENSIVE METABOLIC PANEL
ALT: 16 U/L (ref 0–44)
AST: 18 U/L (ref 15–41)
Albumin: 3.6 g/dL (ref 3.5–5.0)
Alkaline Phosphatase: 52 U/L (ref 38–126)
Anion gap: 7 (ref 5–15)
BUN: 9 mg/dL (ref 8–23)
CHLORIDE: 99 mmol/L (ref 98–111)
CO2: 32 mmol/L (ref 22–32)
CREATININE: 0.71 mg/dL (ref 0.61–1.24)
Calcium: 9.3 mg/dL (ref 8.9–10.3)
GFR calc Af Amer: >60 mL/min (ref >60)
GFR calc non Af Amer: >60 mL/min (ref >60)
Glucose, Bld: 106 mg/dL — ABNORMAL HIGH (ref 70–99)
Potassium: 3.6 mmol/L (ref 3.5–5.1)
SODIUM: 138 mmol/L (ref 135–145)
Total Bilirubin: 1.2 mg/dL (ref 0.3–1.2)
Total Protein: 6.8 g/dL (ref 6.5–8.1)

## 2018-09-27 LAB — MAGNESIUM: MAGNESIUM: 2.2 mg/dL (ref 1.7–2.4)

## 2018-09-27 LAB — LACTATE DEHYDROGENASE: LDH: 128 U/L (ref 98–192)

## 2018-09-27 NOTE — Patient Instructions (Signed)
[name] at [facility]  Discharge Instructions:  You were seen by dr. Raliegh Ip _______________________________________________________________  Thank you for choosing [facility] to provide your oncology and hematology care.  To afford each patient quality time with our providers, please arrive at least 15 minutes before your scheduled appointment.  You need to re-schedule your appointment if you arrive 10 or more minutes late.  We strive to give you quality time with our providers, and arriving late affects you and other patients whose appointments are after yours.  Also, if you no show three or more times for appointments you may be dismissed from the clinic.  Again, thank you for choosing [facility] hope is that these requests will allow you access to exceptional care and in a timely manner. _______________________________________________________________  If you have questions after your visit, please contact our office at [phone] between the hours of 8:30 a.m. and 5:00 p.m. Voicemails left after 4:30 p.m. will not be returned until the following business day. _______________________________________________________________  For prescription refill requests, have your pharmacy contact our office. _______________________________________________________________  Recommendations made by the consultant and any test results will be sent to your referring physician. _______________________________________________________________

## 2018-09-27 NOTE — Progress Notes (Signed)
[address], [place] [address]   CLINIC:  Medical Oncology/Hematology  PCP:  [name], DO [place] [address] [phone]   REASON FOR VISIT: Follow-up for tonsillar cancer  CURRENT THERAPY: s/p chemoradiation   BRIEF ONCOLOGIC HISTORY:    Tonsillar cancer ([facility])   06/02/2018 Initial Diagnosis    Tonsillar cancer ([facility])    06/15/2018 -  Chemotherapy    The patient had palonosetron (ALOXI) injection 0.25 mg, 0.25 mg, Intravenous,  Once, 3 of 3 cycles Administration: 0.25 mg (07/13/2018), 0.25 mg (07/15/2018), 0.25 mg (08/10/2018), 0.25 mg (09/13/2018) CARBOplatin (PARAPLATIN) 140 mg in sodium chloride 0.9 % 100 mL chemo infusion, 70 mg/m2 = 140 mg (100 % of original dose 70 mg/m2), Intravenous,  Once, 3 of 3 cycles Dose modification: 70 mg/m2 (original dose 70 mg/m2, Cycle 1, Reason: Other (see comments), Comment: french protocol head and neck), 52.5 mg/m2 (75 % of original dose 70 mg/m2, Cycle 3, Reason: Provider Judgment) Administration: 140 mg (07/13/2018), 140 mg (07/14/2018), 140 mg (07/15/2018), 140 mg (07/19/2018), 140 mg (08/10/2018), 140 mg (08/11/2018), 140 mg (08/12/2018), 140 mg (08/13/2018), 140 mg (09/13/2018), 140 mg (09/14/2018), 100 mg (09/15/2018), 100 mg (09/16/2018) fluorouracil (ADRUCIL) 4,750 mg in sodium chloride 0.9 % 55 mL chemo infusion, 600 mg/m2/day = 4,750 mg (100 % of original dose 600 mg/m2/day), Intravenous, 4D (96 hours ), 3 of 3 cycles Dose modification: 600 mg/m2/day (original dose 600 mg/m2/day, Cycle 1, Reason: Other (see comments), Comment: french protocol head and neck) Administration: 4,750 mg (07/13/2018), 4,750 mg (08/10/2018), 4,750 mg (09/13/2018)  for chemotherapy treatment.       CANCER STAGING: Cancer Staging Tonsillar cancer ([facility]) Staging form: Pharynx - HPV-Mediated Oropharynx, AJCC 8th Edition - Clinical stage from 06/02/2018: Stage II (cT3, cN2, cM0) - Unsigned    INTERVAL HISTORY:    Mr. Holifield 67 y.o. male returns for routine follow-up for tonsillar cancer. Patient is still having issues swallowing and spitting all his saliva out. He denies having mouth sores and is able to open his mouth wider and talk better. He is still taking in osmolite 4-5 cans daily. He is maintaining his weight well. He denies any new pains. Denies any nausea, vomiting, or diarrhea. Denies any fevers or recent infections.     REVIEW OF SYSTEMS:  Review of Systems  Constitutional: Positive for fatigue.  HENT:   Positive for trouble swallowing.   All other systems reviewed and are negative.    PAST MEDICAL/SURGICAL HISTORY:  Past Medical History:  Diagnosis Date  . HOH (hard of hearing)   . Hyperlipidemia   . Mass of oral cavity 05/12/2018  . Positive FIT (fecal immunochemical test) 05/12/2018  . PTSD (post-traumatic stress disorder)    has not been offically diagnosised  . Tonsillar cancer ([facility]) 06/02/2018   Past Surgical History:  Procedure Laterality Date  . APPENDECTOMY  1969  . COLONOSCOPY W/ POLYPECTOMY     remote past  . ESOPHAGOGASTRODUODENOSCOPY  08/04/2018   Dr. Arnoldo Morale: erythema of larynx, normal stomach, normal duodenum. PEG Placed 20 F, bumper at 2.5 cm  . ESOPHAGOGASTRODUODENOSCOPY (EGD) WITH PROPOFOL N/A 08/04/2018   Procedure: ESOPHAGOGASTRODUODENOSCOPY (EGD) WITH PROPOFOL;  Surgeon: Aviva Signs, MD;  [facility];  Service: Gastroenterology;  Laterality: N/A;  . FINGER FRACTURE SURGERY Left   . MULTIPLE EXTRACTIONS WITH ALVEOLOPLASTY N/A 06/15/2018   Procedure: Extraction of tooth #'s 2,12,13,18,20,and 21 with alveoloplasty and gross debridement of remaining teeth;  Surgeon:  Lenn Cal, DDS;  Location: [facility];  Service: Oral Surgery;  Laterality: N/A;  . PEG PLACEMENT N/A 08/04/2018   Procedure: PERCUTANEOUS ENDOSCOPIC GASTROSTOMY (PEG) PLACEMENT;  Surgeon: Aviva Signs, MD;  Location[facility];  Service: Gastroenterology;  Laterality: N/A;  .  PORTACATH PLACEMENT Left 07/02/2018   Procedure: INSERTION PORT-A-CATH;  Surgeon: Aviva Signs, MD;  Location: [facility];  Service: General;  Laterality: Left;     SOCIAL HISTORY:  Social History   Socioeconomic History  . Marital status: Divorced    Spouse name: Not on file  . Number of children: Not on file  . Years of education: Not on file  . Highest education level: Not on file  Occupational History  . Occupation: English as a second language teacher, retired  Scientific laboratory technician  . Financial resource strain: Not on file  . Food insecurity:    Worry: Not on file    Inability: Not on file  . Transportation needs:    Medical: Not on file    Non-medical: Not on file  Tobacco Use  . Smoking status: Former Smoker    Years: 15.00    Types: Cigarettes    Last attempt to quit: 2000    Years since quitting: 19.7  . Smokeless tobacco: Never Used  Substance and Sexual Activity  . Alcohol use: Yes    Alcohol/week: 12.0 standard drinks    Types: 12 Cans of beer per week  . Drug use: Never  . Sexual activity: Not Currently  Lifestyle  . Physical activity:    Days per week: Not on file    Minutes per session: Not on file  . Stress: Not on file  Relationships  . Social connections:    Talks on phone: Not on file    Gets together: Not on file    Attends religious service: Not on file    Active member of club or organization: Not on file    Attends meetings of clubs or organizations: Not on file    Relationship status: Not on file  . Intimate partner violence:    Fear of current or ex partner: Not on file    Emotionally abused: Not on file    Physically abused: Not on file    Forced sexual activity: Not on file  Other Topics Concern  . Not on file  Social History Narrative   Mr. Homes is a pleasant gentleman who is a retired English as a second language teacher.  He lives independently with his dog here in [place].  He formally lived in [place].    FAMILY HISTORY:  Family History  Problem Relation Age of Onset  . Diabetes Father          died at age 58   . Colon cancer Neg Hx     CURRENT MEDICATIONS:  Outpatient Encounter Medications as of 09/27/2018  Medication Sig  . Amino Acids-Protein Hydrolys (FEEDING SUPPLEMENT, PRO-STAT SUGAR FREE 64,) LIQD Place 30 mLs into feeding tube daily.  Marland Kitchen ibuprofen (ADVIL,MOTRIN) 200 MG tablet Take 400 mg by mouth every 6 (six) hours as needed for moderate pain.   Marland Kitchen lidocaine (XYLOCAINE) 2 % solution Viscous lidocaine 2%/ Maalox 1:1 mixture. Swish and swallow 1 tablespoon four times a day100 (Patient taking differently: Use as directed 15 mLs in the mouth or throat 4 (four) times daily. Viscous lidocaine 2%/ Maalox 1:1 mixture. Swish and swallow 1 tablespoon four times a day100)  . lidocaine-prilocaine (EMLA) cream Apply to affected area once (Patient taking differently: Apply 1 application topically  as directed. Apply to affected area once)  . Morphine Sulfate (MORPHINE CONCENTRATE) 10 mg / 0.5 ml concentrated solution Take 0.5 mLs (10 mg total) by mouth every 3 (three) hours as needed for severe pain.  . Nutritional Supplements (FEEDING SUPPLEMENT, OSMOLITE 1.5 CAL,) LIQD Place 237 mLs into feeding tube 2 (two) times daily.  Marland Kitchen oxyCODONE-acetaminophen (PERCOCET) 5-325 MG tablet Take one tablet by mouth every 4 hours as needed for pain.  Marland Kitchen prochlorperazine (COMPAZINE) 10 MG tablet Take 1 tablet (10 mg total) by mouth every 6 (six) hours as needed (Nausea or vomiting).  . sodium fluoride (FLUORISHIELD) 1.1 % GEL dental gel Instill one drop of gel per tooth space of fluoride tray. Place over teeth for 5 minutes. Remove. Spit out excess. Repeat nightly. (Patient taking differently: Place 1 drop onto teeth See admin instructions. Instill one drop of gel per tooth space of fluoride tray. Place over teeth for 5 minutes. Remove. Spit out excess. Repeat nightly.)  . sucralfate (CARAFATE) 1 GM/10ML suspension Carafate 1gm/77ml & Viscous lidocaine 2% 1:1 mixture.  Swish and swallow 1 tablespoon four times  a day. (Patient taking differently: Take by mouth 4 (four) times daily. Carafate 1gm/46ml & Viscous lidocaine 2% 1:1 mixture.  Swish and swallow 1 tablespoon four times a day.)   No facility-administered encounter medications on file as of 09/27/2018.     ALLERGIES:  No Known Allergies   PHYSICAL EXAM:  ECOG Performance status: 1  Vitals:   09/27/18 0833  BP: (!) 122/56  Pulse: 77  Resp: 18  Temp: 97.9 F (36.6 C)  SpO2: 91%   Filed Weights   09/27/18 0833  Weight: 151 lb (68.5 kg)    Physical Exam  Constitutional: He is oriented to person, place, and time. He appears well-developed and well-nourished.  Cardiovascular: Normal rate, regular rhythm and normal heart sounds.  Pulmonary/Chest: Effort normal and breath sounds normal.  Musculoskeletal: Normal range of motion.  Neurological: He is alert and oriented to person, place, and time.  Skin: Skin is warm and dry.  Psychiatric: He has a normal mood and affect. His behavior is normal. Judgment and thought content normal.  No adenopathy in the neck region palpable.  No mucositis.   LABORATORY DATA:  I have reviewed the labs as listed.  CBC    Component Value Date/Time   WBC 2.5 (L) 09/27/2018 0820   RBC 3.20 (L) 09/27/2018 0820   HGB 10.2 (L) 09/27/2018 0820   HGB 14.2 05/12/2018 0949   HCT 31.9 (L) 09/27/2018 0820   HCT 42.0 05/12/2018 0949   PLT 112 (L) 09/27/2018 0820   PLT 256 05/12/2018 0949   MCV 99.7 09/27/2018 0820   MCV 87 05/12/2018 0949   MCH 31.9 09/27/2018 0820   MCHC 32.0 09/27/2018 0820   RDW 16.1 (H) 09/27/2018 0820   RDW 13.0 05/12/2018 0949   LYMPHSABS 0.4 (L) 09/27/2018 0820   LYMPHSABS 1.1 05/12/2018 0949   MONOABS 0.3 09/27/2018 0820   EOSABS 0.2 09/27/2018 0820   EOSABS 0.1 05/12/2018 0949   BASOSABS 0.0 09/27/2018 0820   BASOSABS 0.0 05/12/2018 0949   CMP Latest Ref Rng & Units 09/27/2018 09/10/2018 08/29/2018  Glucose 70 - 99 mg/dL 106(H) 126(H) 122(H)  BUN 8 - 23 mg/dL 9 13 8     Creatinine 0.61 - 1.24 mg/dL 0.71 0.73 0.57(L)  Sodium 135 - 145 mmol/L 138 140 137  Potassium 3.5 - 5.1 mmol/L 3.6 3.7 3.8  Chloride 98 - 111 mmol/L 99 100  103  CO2 22 - 32 mmol/L 32 33(H) 28  Calcium 8.9 - 10.3 mg/dL 9.3 9.0 7.5(L)  Total Protein 6.5 - 8.1 g/dL 6.8 6.9 -  Total Bilirubin 0.3 - 1.2 mg/dL 1.2 0.7 -  Alkaline Phos 38 - 126 U/L 52 58 -  AST 15 - 41 U/L 18 18 -  ALT 0 - 44 U/L 16 13 -         ASSESSMENT & PLAN:   Tonsillar cancer ([facility]) 1.  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 Benjamine Mola on 05/13/2018, status post biopsy consistent with squamous cell carcinoma, P 16+ - Baseline hearing loss and chronic kidney disease with creatinine of 1.34. - Pretreatment oropharyngeal examination shows a necrotic left tonsillar mass, not clearly visualized secondary to increased gag reflex.  There is a left lower neck lymph node palpable.  PET CT scan showed level 2, 3, 5 metastatic nodes on the left neck and mildly metabolic right level 2 lymph node along with the left tonsillar primary.   - He received 3 cycles of carboplatin and 5-FU and radiation therapy from 07/13/2018 through 09/13/2018. - He was hospitalized after cycle 2 with severe mucositis and neutropenic fever. - Today his mucositis is better.  We will plan to see him back in 3 weeks with repeat CT scan of the neck.  2.  Nutrition: -He is taking in 4 to 5 cans of Osmolite daily.  His weight has been stable.      Orders placed this encounter:  Orders Placed This Encounter  Procedures  . CT SOFT TISSUE NECK W CONTRAST  . Lactate dehydrogenase  . Magnesium  . CBC with Differential/Platelet  . Comprehensive metabolic panel      Derek Jack, MD [facility] [phone]

## 2018-09-27 NOTE — Assessment & Plan Note (Signed)
1.  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 Benjamine Mola on 05/13/2018, status post biopsy consistent with squamous cell carcinoma, P 16+ - Baseline hearing loss and chronic kidney disease with creatinine of 1.34. - Pretreatment oropharyngeal examination shows a necrotic left tonsillar mass, not clearly visualized secondary to increased gag reflex.  There is a left lower neck lymph node palpable.  PET CT scan showed level 2, 3, 5 metastatic nodes on the left neck and mildly metabolic right level 2 lymph node along with the left tonsillar primary.   - He received 3 cycles of carboplatin and 5-FU and radiation therapy from 07/13/2018 through 09/13/2018. - He was hospitalized after cycle 2 with severe mucositis and neutropenic fever. - Today his mucositis is better.  We will plan to see him back in 3 weeks with repeat CT scan of the neck.  2.  Nutrition: -He is taking in 4 to 5 cans of Osmolite daily.  His weight has been stable.

## 2018-09-28 ENCOUNTER — Encounter (HOSPITAL_COMMUNITY): Payer: Self-pay | Admitting: Dietician

## 2018-09-28 ENCOUNTER — Other Ambulatory Visit (HOSPITAL_COMMUNITY): Payer: Self-pay | Admitting: *Deleted

## 2018-09-28 ENCOUNTER — Other Ambulatory Visit (HOSPITAL_COMMUNITY): Payer: Self-pay | Admitting: Nurse Practitioner

## 2018-09-28 DIAGNOSIS — C099 Malignant neoplasm of tonsil, unspecified: Secondary | ICD-10-CM

## 2018-09-28 NOTE — Progress Notes (Signed)
Nutrition Follow-up 67 y/o male PMHx HLD, HOH, CKD, w/ little recent medical follow up. Presented to [facility] 6/12 after referred by local ENT MD for large L tonsillar mass s/p biopsy. + for SCC.  Begun radiation 7/22 and chemo 7/23. S/P peg placement 8/14 and start of Tube feeding 8/16. Finished radiation 9/12 and chemo 9/25.   Patient completed chemo approximately 2 weeks ago. RD reaching out to see how patient's oral intake is progressing, if at all.   He was seen in office yesterday and noted to be 151 lbs, a loss of 6 lbs x2 weeks. Prior to this measurement, his weight had been stable x3 weeks at 155-158 lbs.   Last time RD spoke to patient, he had been practicing the following regimen:   1 can osmolite 1.5 + 1 bottle Ensure, three times per day. + 3 water bottles worth of flushes (16.9 oz?).  -This was providing him:  2115 kcals, 104.7g Pro, 1083 mls +1521 from flushes.  ______________________________  Today, pt notes he changed his enteral regimen again. He stopped infusing ensure, because it made him feel a displeasurable way "not exactly nauseous... its hard to explain". He has only been infusing 3-4 cans of Osmolite 1.5/day, "depending on if I am awake or not". RD noted he has lost weight and he says "yes, probably because I haven't been doing the Ensure"  He says he getting 2-3 bottles of water/day (16.9 oz?). At first, he insinuated these were via PEG, though later he says he is drinking 1-2 bottles water and putting 1 through PEG w/ Feeds. He again says his urine is the color of Osmolite. RD said it should be lighter than this.  He has started putting Camp[place]ells soups into his PEG.   Oral intake-wise, he still is not eating, though he has attempted. He tried to eat a grape in the grocery store and this immediately led to choking-"I almost puked". He says he is gargling with gingerale and this is helping a great deal with mouth sores, though he still has severe pain in oral cavity. He says  he tried to brush his teeth and the toothpaste caused unbearable pain. He also still has severe dysgeusia and everything "tastes nasty".   RD asked if he would be willing to pursue a ST consult. He said he thought they were going to call him to set one up, but never did. RD noted in past he had declined this d/t cost. Today, he says he would be willing "if it would help me" and the copay was affordable. This still does not sound to be a major priority, as he notes he needs to buy an iron today to get the wrinkles out of his clothes.   Given he is not taking anything by mouth, he asks for another case of Osmolite 1.5.   Overall, he does report feeling better-he says the last couple night he has slept better than he has in months and his mouth pain, albeit still present, has improved.  Wt Readings from Last 10 Encounters:  09/27/18 151 lb (68.5 kg)  09/17/18 157 lb (71.2 kg)  09/16/18 156 lb 12.8 oz (71.1 kg)  09/15/18 157 lb 1.6 oz (71.3 kg)  09/14/18 155 lb (70.3 kg)  09/13/18 154 lb 12.8 oz (70.2 kg)  09/10/18 158 lb 6.4 oz (71.8 kg)  08/29/18 154 lb 8 oz (70.1 kg)  08/18/18 157 lb 4.8 oz (71.4 kg)  08/13/18 161 lb 9.6 oz (73.3 kg)  MEDICATIONS: Chemo: COMPLETED Other Meds: Liquid morphine, Percocet, Carafate, Compazine  LABS:  10/7 lab work: Largely unremarkable. Renal labs WDL, Albumin 3.4->3.6   Recent Labs  Lab 09/27/18 0820  NA 138  K 3.6  CL 99  CO2 32  BUN 9  CREATININE 0.71  CALCIUM 9.3  MG 2.2  GLUCOSE 106*   ANTHROPOMETRICS: Height:  Ht Readings from Last 1 Encounters:  08/24/18 '5\' 8"'  (1.727 m)   Weight:  Wt Readings from Last 1 Encounters:  09/27/18 151 lb (68.5 kg)   BMI:  BMI Readings from Last 1 Encounters:  09/27/18 22.96 kg/m   UBW: 182 when presenting In June IBW: 70 kg Wt change: - 6 lbs x 2 weeks.   Re-Estimated needs:  Energy: 2000-2200 kcals (30-32 kcal/kg bw) Protein:82-96g Pro (1.2-1.4 g/kg bw) Fluid: >2  L fluid (30 ml/kcal)    NUTRITION DIAGNOSIS:  Inadequate oral intake related to side effects of chemoradiation as patient report of minimal PO intake and need for PEG   DOCUMENTATION CODES:  Not applicable at this time.   INTERVENTION:  Weight loss is certainly related to him cutting his TF in half. His reported regimen of 3-4 cans of Osmolite 1.5 is providing him only:  1065-1420 kcals, 45-60g Pro and 543-724 mls free water. +1-1.5L water from water bottles.   Patient is reluctant to go up on PEG feeds. This reason he gave for this was vague, "you would have to be in my body to understand". He does note they leave a poor aftertaste. He wants to just put cambells soups through tube.   RD noted this would not provide nearly as many kcals/protein. RD reviewed that the more nutrition he gets now, the quicker he will heal and the less time he will need to use his PEG. RD was able to get patient to compromise and he agreed to do 4 cans of Osmolite 1.5 + 1 can of Ensure/day (however, as we were hanging up phone, he states "okay, so 3-4 cans of osmolite and 1 can Ensure"). Though less than RD would like, he would not agree to do more and this is more than he is infusing now. This enteral regimen provides: 1770 kcals, 80g Pro and 904 ml fluid (+1-1.5 Liters from Free water flushes)  Pt notes he only has enough TF for another 4-5 days. RD will speak with [facility] liason regarding sending another case. He still is taking nothing other than water by mouth at this time and remains reliant on PEG.   Regarding dysphagia, patient sounds to be at very high risk for aspiration. RD recommended he try to budget for an ST eval, as this would help him learn exercises/strategies to regain swallow function quicker. Patient says he may be willing to have speech evaluation, depending copay amount. Will speak with NP/MD about writing orders. Noted to SLP that patient would like to how much his copay would be.   GOAL:  Patient will meet greater than  or equal to 90% of their needs  NOT MET  MONITOR:  Labs, Weight trends, TF tolerance, I & O's, PO intake, swallow function  Next Visit: Phone x1 week. 10/25 with Nena Polio RD, LDN, CNSC Clinical Nutrition Available Tues-Sat via Pager: [phone] 09/28/2018 9:19 AM

## 2018-10-01 ENCOUNTER — Other Ambulatory Visit (HOSPITAL_COMMUNITY): Payer: Self-pay | Admitting: Specialist

## 2018-10-01 DIAGNOSIS — C099 Malignant neoplasm of tonsil, unspecified: Secondary | ICD-10-CM

## 2018-10-04 ENCOUNTER — Ambulatory Visit (HOSPITAL_COMMUNITY): Payer: Self-pay

## 2018-10-04 ENCOUNTER — Ambulatory Visit (HOSPITAL_COMMUNITY): Payer: Self-pay | Admitting: Hematology

## 2018-10-04 ENCOUNTER — Other Ambulatory Visit (HOSPITAL_COMMUNITY): Payer: Self-pay

## 2018-10-05 ENCOUNTER — Other Ambulatory Visit (HOSPITAL_COMMUNITY): Payer: Self-pay | Admitting: Nurse Practitioner

## 2018-10-05 ENCOUNTER — Ambulatory Visit (HOSPITAL_COMMUNITY): Payer: Self-pay

## 2018-10-05 ENCOUNTER — Ambulatory Visit (HOSPITAL_COMMUNITY)
Admission: RE | Admit: 2018-10-05 | Discharge: 2018-10-05 | Disposition: A | Discharge status: Home / Self Care | Payer: Medicare Other | Source: Ambulatory Visit | Attending: Nurse Practitioner | Admitting: Nurse Practitioner

## 2018-10-05 ENCOUNTER — Encounter (HOSPITAL_COMMUNITY): Payer: Self-pay | Admitting: Speech Pathology

## 2018-10-05 ENCOUNTER — Encounter (HOSPITAL_COMMUNITY): Payer: Self-pay | Admitting: Dietician

## 2018-10-05 ENCOUNTER — Ambulatory Visit (HOSPITAL_COMMUNITY): Payer: Medicare Other | Attending: Hematology | Admitting: Speech Pathology

## 2018-10-05 ENCOUNTER — Other Ambulatory Visit: Payer: Self-pay

## 2018-10-05 DIAGNOSIS — E43 Unspecified severe protein-calorie malnutrition: Secondary | ICD-10-CM | POA: Insufficient documentation

## 2018-10-05 DIAGNOSIS — R1312 Dysphagia, oropharyngeal phase: Secondary | ICD-10-CM | POA: Insufficient documentation

## 2018-10-05 DIAGNOSIS — Z931 Gastrostomy status: Secondary | ICD-10-CM | POA: Diagnosis not present

## 2018-10-05 DIAGNOSIS — E785 Hyperlipidemia, unspecified: Secondary | ICD-10-CM | POA: Diagnosis not present

## 2018-10-05 DIAGNOSIS — N189 Chronic kidney disease, unspecified: Secondary | ICD-10-CM | POA: Diagnosis not present

## 2018-10-05 DIAGNOSIS — C099 Malignant neoplasm of tonsil, unspecified: Secondary | ICD-10-CM | POA: Diagnosis not present

## 2018-10-05 DIAGNOSIS — R1310 Dysphagia, unspecified: Secondary | ICD-10-CM | POA: Diagnosis present

## 2018-10-05 NOTE — Therapy (Signed)
Dysphagia, oropharyngeal phase    Recommendations/Treatment - 10/05/18 1748      Swallow Evaluation Recommendations   Recommended Consults  Consider GI evaluation;Consider ENT evaluation   consider dilation of UES if possible   SLP Diet Recommendations  --   thin water and ice chips only after oral care   Liquid Administration via  Cup    Medication Administration  Via alternative means         Problem List Patient Active Problem List   Diagnosis Date Noted  . Protein-calorie malnutrition, severe 08/25/2018  . Neutropenic fever ([place]) 08/24/2018  . Hyperlipidemia 08/24/2018  . Pancytopenia ([place]) 08/24/2018  . Hyponatremia 08/24/2018  . Hyperbilirubinemia 08/24/2018  . Problems with swallowing and mastication   . Cellulitis and perichondritis of larynx   . Chronic periodontitis 06/10/2018  . Partial loss of teeth, unspecified edentulism 06/10/2018  . Tonsillar cancer ([place]) 06/02/2018  . Mass of oral cavity 05/12/2018  . Positive FIT (fecal immunochemical test) 05/12/2018   Thank you,  Genene Churn, [place]  [place] 10/05/2018, 6:03 PM  [facility] [address] [place], [place], [address] Phone: [phone]   Fax:  [phone]  Name: LERAY GARVERICK MRN: [id number] Date of Birth: [birth year]  Dysphagia, oropharyngeal phase    Recommendations/Treatment - 10/05/18 1748      Swallow Evaluation Recommendations   Recommended Consults  Consider GI evaluation;Consider ENT evaluation   consider dilation of UES if possible   SLP Diet Recommendations  --   thin water and ice chips only after oral care   Liquid Administration via  Cup    Medication Administration  Via alternative means         Problem List Patient Active Problem List   Diagnosis Date Noted  . Protein-calorie malnutrition, severe 08/25/2018  . Neutropenic fever ([place]) 08/24/2018  . Hyperlipidemia 08/24/2018  . Pancytopenia ([place]) 08/24/2018  . Hyponatremia 08/24/2018  . Hyperbilirubinemia 08/24/2018  . Problems with swallowing and mastication   . Cellulitis and perichondritis of larynx   . Chronic periodontitis 06/10/2018  . Partial loss of teeth, unspecified edentulism 06/10/2018  . Tonsillar cancer ([place]) 06/02/2018  . Mass of oral cavity 05/12/2018  . Positive FIT (fecal immunochemical test) 05/12/2018   Thank you,  Genene Churn, [place]  [place] 10/05/2018, 6:03 PM  [facility] [address] [place], [place], [address] Phone: [phone]   Fax:  [phone]  Name: LERAY GARVERICK MRN: [id number] Date of Birth: [birth year]  [facility] [address] [place], [place], [address] Phone: [phone]   Fax:  [phone]  Modified Barium Swallow  Patient Details  Name: KIRT CHEW MRN: [id number] Date of Birth: 1951/07/02 No data recorded  Encounter Date: 10/05/2018  End of Session - 10/05/18 1750    Visit Number  1    Number of Visits  3    Authorization Type  UHC Medicare   $40 copay until deductible met and then covered at 100%   SLP Start Time  1345    SLP Stop Time   1431    SLP Time Calculation (min)  46 min    Activity Tolerance  Patient tolerated treatment well       Past Medical History:  Diagnosis Date  . HOH (hard of hearing)   . Hyperlipidemia   . Mass of oral cavity 05/12/2018  . Positive FIT (fecal immunochemical test) 05/12/2018  . PTSD (post-traumatic stress disorder)    has not been offically diagnosised  . Tonsillar cancer ([place]) 06/02/2018    Past Surgical History:  Procedure Laterality Date  . APPENDECTOMY  1969  . COLONOSCOPY W/ POLYPECTOMY     remote past  . ESOPHAGOGASTRODUODENOSCOPY  08/04/2018   Dr. Arnoldo Morale: erythema of larynx, normal stomach, normal duodenum. PEG Placed 20 F, bumper at 2.5 cm  . ESOPHAGOGASTRODUODENOSCOPY (EGD) WITH PROPOFOL N/A 08/04/2018   Procedure: ESOPHAGOGASTRODUODENOSCOPY (EGD) WITH PROPOFOL;  Surgeon: Aviva Signs, MD;  [facility];  Service: Gastroenterology;  Laterality: N/A;  . FINGER FRACTURE SURGERY Left   . MULTIPLE EXTRACTIONS WITH ALVEOLOPLASTY N/A 06/15/2018   Procedure: Extraction of tooth #'s 2,12,13,18,20,and 21 with alveoloplasty and gross debridement of remaining teeth;  Surgeon: Lenn Cal, DDS;  Location: [facility];  Service: Oral Surgery;  Laterality: N/A;  . PEG PLACEMENT N/A 08/04/2018   Procedure: PERCUTANEOUS ENDOSCOPIC GASTROSTOMY (PEG) PLACEMENT;  Surgeon: Aviva Signs, MD;  [facility];  Service: Gastroenterology;  Laterality: N/A;  . PORTACATH  PLACEMENT Left 07/02/2018   Procedure: INSERTION PORT-A-CATH;  Surgeon: Aviva Signs, MD;  [facility];  Service: General;  Laterality: Left;    There were no vitals filed for this visit.  Subjective Assessment - 10/05/18 1725    Subjective  "I can't swallow anything."    Special Tests  MBSS    Currently in Pain?  No/denies          General - 10/05/18 1726      General Information   Date of Onset  05/12/18    HPI  [name] is a 67 yo male who was referred by Dr. 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  He is not a candidate for high-dose cisplatin given his chronic kidney disease and hearing dysfunction.  He is also not a candidate for weekly cisplatin as his creatinine might get worse. He will have the recommended 94-01 [place] protocol with 5-FU administered as a 24-hour continuous infusion at a dose of 600 mg per M square per day for 4 days and carboplatin given as a daily bolus of 70 mg/m2/day for 4 days.  Chemo will be given on days 1, 22 and 43. Radiation started 07/12/18 and will go until 08/26/2018 for 5 days per week.  Mr. Marzette lives alone in [place] with his dog. He  Dysphagia, oropharyngeal phase    Recommendations/Treatment - 10/05/18 1748      Swallow Evaluation Recommendations   Recommended Consults  Consider GI evaluation;Consider ENT evaluation   consider dilation of UES if possible   SLP Diet Recommendations  --   thin water and ice chips only after oral care   Liquid Administration via  Cup    Medication Administration  Via alternative means         Problem List Patient Active Problem List   Diagnosis Date Noted  . Protein-calorie malnutrition, severe 08/25/2018  . Neutropenic fever ([place]) 08/24/2018  . Hyperlipidemia 08/24/2018  . Pancytopenia ([place]) 08/24/2018  . Hyponatremia 08/24/2018  . Hyperbilirubinemia 08/24/2018  . Problems with swallowing and mastication   . Cellulitis and perichondritis of larynx   . Chronic periodontitis 06/10/2018  . Partial loss of teeth, unspecified edentulism 06/10/2018  . Tonsillar cancer ([place]) 06/02/2018  . Mass of oral cavity 05/12/2018  . Positive FIT (fecal immunochemical test) 05/12/2018   Thank you,  Genene Churn, [place]  [place] 10/05/2018, 6:03 PM  [facility] [address] [place], [place], [address] Phone: [phone]   Fax:  [phone]  Name: LERAY GARVERICK MRN: [id number] Date of Birth: [birth year]

## 2018-10-05 NOTE — Progress Notes (Signed)
RD had been planning to touch base with patient today, though when RD arrived to office pt had already left a VM on RD phone yesterday. He stated he had not yet received his tube feeding shipment. He says he has 5 cans of tube feeding left. [facility] liason had notified RD last week that this order had been sent  RD spoke again with [place]. Apparently, the nutrition support team had tried to reach patient on the 8th, 9th and 10th to no avail. Per their protocol, the patient themselves needs to confirm shipment prior to order being sent. RD was able to order this one on behalf of patient. Though this was noted by [facility] team to be an exception and he will need to be the one to order in future.   RD received another call from pt just after speaking w/ [facility]. RD noted to pt why he had not received TF and the necessity that he answer his phone. He notes his hearing is very poor, and he says he has been unable to understand what is being said on the phone. This is understandable as he is extremely HOH. RD notified [facility] of this. She stated she will pass on the concerns regarding patients auditory deficits to the nutrition team.  RD met with patient at [facility]. Notified him of the proper channel through which he needs to request tube feeding. Provided contact number.   He noted he had been rationing his Osmolite this past week because he was just about out. He says he was not even getting 4 cans/day. He also notes his mouth is severely swollen from sores. He has been bolusing cans of soup through tube.  RD then went and spoke w/ [facility] NP/MD. Apprised them of patients issues. RD took list of medications to patients w/ instructions. RD also provided patient with case of Ensure.   Of note, per Todays MBSS by SLP, patient had been deemed unsafe for oral intake at this time (except for ice chips). RD reiterated he needs to continue to be strict about his TF regimen. He notes understanding. Pt to be re-seen on 10/25 by Joli.    [name] RD, LDN, CNSC Clinical Nutrition Available Tues-Sat via Pager: [phone] 10/05/2018 2:51 PM

## 2018-10-06 ENCOUNTER — Telehealth (HOSPITAL_COMMUNITY): Payer: Self-pay | Admitting: Speech Pathology

## 2018-10-06 ENCOUNTER — Ambulatory Visit (HOSPITAL_COMMUNITY): Payer: Self-pay

## 2018-10-06 ENCOUNTER — Encounter (HOSPITAL_COMMUNITY): Payer: Self-pay | Admitting: Speech Pathology

## 2018-10-06 NOTE — Telephone Encounter (Signed)
L/m asking pt to call back to schedule 3xs with [facility] for Speech Tx. Explained that ins would cover at 100% from now to Dec 12 for Speech. NF 10/06/18

## 2018-10-07 ENCOUNTER — Ambulatory Visit (HOSPITAL_COMMUNITY): Payer: Self-pay

## 2018-10-08 ENCOUNTER — Encounter (HOSPITAL_COMMUNITY): Payer: Self-pay

## 2018-10-12 ENCOUNTER — Telehealth (HOSPITAL_COMMUNITY): Payer: Self-pay | Admitting: Dietician

## 2018-10-12 NOTE — Telephone Encounter (Signed)
Called patient to follow up from impromptu visit last Wednesday. At that time, he had been going without sufficient tube feeding due to lack of formula (see that note for further information).   RD calling to make sure patient is infusing ATLEAST 4 cans/day (had refused to do more) and to also make sure he is pursuing the ST appointments (noted op therapy had been unable to get in touch with pt).  Got voicemail. Left message explaining reason for call and advised him to call RD with any nutrition concerns.   Reminded him that he has appointment with Joli on Friday. He will likely be seen to have lost weight given his week or so with rationed TF formula.   Phone communications w/ pt have been historically difficult given his severe HOH, which also has subjectively worsened over past month or so.   [name] RD, LDN, CNSC Clinical Nutrition Available Tues-Sat via Pager: [phone] 10/12/2018 12:44 PM

## 2018-10-15 ENCOUNTER — Inpatient Hospital Stay (HOSPITAL_COMMUNITY): Payer: Medicare Other

## 2018-10-15 ENCOUNTER — Ambulatory Visit (HOSPITAL_COMMUNITY)
Admission: RE | Admit: 2018-10-15 | Discharge: 2018-10-15 | Disposition: A | Discharge status: Home / Self Care | Payer: Medicare Other | Source: Ambulatory Visit | Attending: Nurse Practitioner | Admitting: Nurse Practitioner

## 2018-10-15 ENCOUNTER — Encounter (HOSPITAL_COMMUNITY): Payer: Self-pay

## 2018-10-15 DIAGNOSIS — C099 Malignant neoplasm of tonsil, unspecified: Secondary | ICD-10-CM | POA: Diagnosis present

## 2018-10-15 DIAGNOSIS — Z923 Personal history of irradiation: Secondary | ICD-10-CM | POA: Diagnosis not present

## 2018-10-15 DIAGNOSIS — R911 Solitary pulmonary nodule: Secondary | ICD-10-CM | POA: Diagnosis not present

## 2018-10-15 LAB — CBC WITH DIFFERENTIAL/PLATELET
Abs Immature Granulocytes: 0.01 10*3/uL (ref 0.00–0.07)
BASOS ABS: 0 10*3/uL (ref 0.0–0.1)
BASOS PCT: 1 %
EOS ABS: 0.1 10*3/uL (ref 0.0–0.5)
Eosinophils Relative: 4 %
HCT: 33 % — ABNORMAL LOW (ref 39.0–52.0)
Hemoglobin: 10.1 g/dL — ABNORMAL LOW (ref 13.0–17.0)
Immature Granulocytes: 1 %
LYMPHS ABS: 0.5 10*3/uL — AB (ref 0.7–4.0)
Lymphocytes Relative: 22 %
MCH: 31.8 pg (ref 26.0–34.0)
MCHC: 30.6 g/dL (ref 30.0–36.0)
MCV: 103.8 fL — ABNORMAL HIGH (ref 80.0–100.0)
Monocytes Absolute: 0.3 10*3/uL (ref 0.1–1.0)
Monocytes Relative: 15 %
NRBC: 0 % (ref 0.0–0.2)
Neutro Abs: 1.2 10*3/uL — ABNORMAL LOW (ref 1.7–7.7)
Neutrophils Relative %: 57 %
PLATELETS: 178 10*3/uL (ref 150–400)
RBC: 3.18 MIL/uL — AB (ref 4.22–5.81)
RDW: 15.7 % — AB (ref 11.5–15.5)
WBC: 2.1 10*3/uL — AB (ref 4.0–10.5)

## 2018-10-15 LAB — COMPREHENSIVE METABOLIC PANEL
ALBUMIN: 3.8 g/dL (ref 3.5–5.0)
ALT: 12 U/L (ref 0–44)
ANION GAP: 7 (ref 5–15)
AST: 16 U/L (ref 15–41)
Alkaline Phosphatase: 55 U/L (ref 38–126)
BILIRUBIN TOTAL: 0.9 mg/dL (ref 0.3–1.2)
BUN: 11 mg/dL (ref 8–23)
CO2: 30 mmol/L (ref 22–32)
Calcium: 9.4 mg/dL (ref 8.9–10.3)
Chloride: 103 mmol/L (ref 98–111)
Creatinine, Ser: 0.74 mg/dL (ref 0.61–1.24)
GFR calc Af Amer: >60 mL/min (ref >60)
GFR calc non Af Amer: >60 mL/min (ref >60)
GLUCOSE: 107 mg/dL — AB (ref 70–99)
Potassium: 4.5 mmol/L (ref 3.5–5.1)
SODIUM: 140 mmol/L (ref 135–145)
TOTAL PROTEIN: 6.9 g/dL (ref 6.5–8.1)

## 2018-10-15 LAB — LACTATE DEHYDROGENASE: LDH: 114 U/L (ref 98–192)

## 2018-10-15 LAB — MAGNESIUM: MAGNESIUM: 2.3 mg/dL (ref 1.7–2.4)

## 2018-10-15 MED ORDER — IOPAMIDOL (ISOVUE-300) INJECTION 61%
75.0000 mL | Freq: Once | INTRAVENOUS | Status: AC | PRN
  Administered 2018-10-15: 75 mL via INTRAVENOUS

## 2018-10-15 NOTE — Progress Notes (Addendum)
Nutrition Follow-up:  Patient with squamous cell carcinoma of the left tonsil stage IV HPV+.  Patient finished chemotherapy and radiation therapy on 09/13/18.    Met with patient in clinic this am. Reports 1st that he has been taking 3 sometimes 4 cartons of osmolite 1.5 via feeding tube (around 7 am, 12, and 4 pm). Later says that he has been taking boost/ensure 3 times a day as well clarified if he was giving this through tube and patient said yes.   Confirmed with patient that he is taking 1 carton of osmolite 1.5 plus 1 carton of ensure/boost at each feeding for a total of 3 cartons of osmolite and 3 cartons of ensure/boost per day. Reports he has been doing this for about 1 month.   He also states that he is giving about 3, 16 oz bottles of water via tube for flushes and mixes with formula to make it go through the tube faster.    Reports bowel movement 1-2 times per day loose but not watery. No real issues with nausea, feels like has to burp but can't sometimes.   Noted in RD's note on 10/15 patient reported not even getting in 4 cans/day of tube feeding due to running low.   Reports that he has been putting tomato soup via tube as well.    Patient unable to take in oral nutrition at this time.  Has been taking some ice chips.  Spitting out thick saliva during visit today.   Concerned about skin around PEG tube site today is irritated.    Asking for another case of ensure today. Reports that he received 7 cases of osmolite 1.5 from [facility]   Medications: reviewed  Labs: no new  Anthropometrics:   Weight taken today at 150 lb 9 oz decreased from  Noted weight on 10/5 151 lb, 157 lb on 9/27  Estimated Energy Needs  Kcals: 2000-2200 Protein: 82-96 g Fluid: >2 L fluid  NUTRITION DIAGNOSIS: Inadequate oral intake continues   INTERVENTION:  Patient can give 3 cartons of osmolite 1.5 plus 3 cartons of ensure enlive daily OR can give 6 cartons of osmolite 1.5 daily via feeding tube.   Continue with water flush. Tube feeding regimen with ensure provides: 2115 calories, 104.7 g protein, and 2536m water (free water from formula).  If patient begins drinking more fluid orally will need to reduce water via tube. Encouraged patient to just use tube for water flush and formula (osmolite and ensure) to decrease risk of clogging and infection.  Another box of ensure given to patient today Encouraged patient to not miss SLP visits and wrote them on sheet for him.  Spoke to RN, Diane and she was able to look at PEG tube site today while patient was in clinic.    MONITORING, EVALUATION, GOAL: weight trends, TF   NEXT VISIT:  RD unable to see patient on days when he is already at [facility] for SLP.  Agreeable to Tuesday, November 5 with NOvid Curd   Keysi Oelkers B. AZenia Resides RSiletz LBlairRegistered Dietitian [phone](pager)

## 2018-10-18 ENCOUNTER — Ambulatory Visit (HOSPITAL_COMMUNITY): Payer: Self-pay | Admitting: Hematology

## 2018-10-19 ENCOUNTER — Encounter (HOSPITAL_COMMUNITY): Payer: Self-pay | Admitting: Hematology

## 2018-10-19 ENCOUNTER — Encounter (HOSPITAL_COMMUNITY): Payer: Self-pay | Admitting: Speech Pathology

## 2018-10-19 ENCOUNTER — Ambulatory Visit (HOSPITAL_COMMUNITY): Payer: Medicare Other | Admitting: Speech Pathology

## 2018-10-19 ENCOUNTER — Inpatient Hospital Stay (HOSPITAL_BASED_OUTPATIENT_CLINIC_OR_DEPARTMENT_OTHER): Payer: Medicare Other | Admitting: Hematology

## 2018-10-19 ENCOUNTER — Other Ambulatory Visit: Payer: Self-pay

## 2018-10-19 VITALS — BP 123/66 | HR 103 | Temp 98.7°F | Resp 16 | Wt 152.0 lb

## 2018-10-19 DIAGNOSIS — R1312 Dysphagia, oropharyngeal phase: Secondary | ICD-10-CM | POA: Diagnosis not present

## 2018-10-19 DIAGNOSIS — R1310 Dysphagia, unspecified: Secondary | ICD-10-CM

## 2018-10-19 DIAGNOSIS — Z87891 Personal history of nicotine dependence: Secondary | ICD-10-CM

## 2018-10-19 DIAGNOSIS — Z9221 Personal history of antineoplastic chemotherapy: Secondary | ICD-10-CM

## 2018-10-19 DIAGNOSIS — Z923 Personal history of irradiation: Secondary | ICD-10-CM

## 2018-10-19 DIAGNOSIS — C099 Malignant neoplasm of tonsil, unspecified: Secondary | ICD-10-CM | POA: Diagnosis not present

## 2018-10-19 DIAGNOSIS — Z79899 Other long term (current) drug therapy: Secondary | ICD-10-CM

## 2018-10-19 NOTE — Therapy (Signed)
Status  On-going       SLP Long Term Goals - 10/19/18 1007      SLP LONG TERM GOAL #1   Title  Same as short term goals       Plan - 10/19/18 1006    Clinical Impression Statement Pt seen in the outpatient cancer clinic for ongoing dysphagia intervention following MBSS completed 10/05/2018. Results and recommendations reviewed again with Pt. Oral secretions are not as copious or as thick this date. Pt reports that he has not been trying ice chips/water at home as often as he should. Pt with good oral care, still has some edema and lesions greater on left buccal and floor of oral cavity. Trismus continues to be an issue, however with notable increased jaw range of motion.   SLP provided moderate verbal, tactile, and visual cues for completion of pharyngeal swallowing exercises (chin tuck against resistance, jaw opening against resistance, lingual protrusion/retraction, and swallowing with bolus of water/ice). Pt received phone call during session from a scheduler at [facility] for trismus follow up. Pt states that he cannot drive to [facility]. His trismus therapy can be completed here. Initially, Pt was not able to come to our appointments, however he is now. SLP will take measurements again next week and adjust trismus device next session.    Speech Therapy Frequency  1x /week    Duration  4 weeks    Treatment/Interventions  Aspiration precaution training;Compensatory strategies;Pharyngeal strengthening exercises;Patient/family education;SLP instruction and feedback;Compensatory techniques    Potential Considerations  Financial  resources    SLP Home Exercise Plan  Pt will complete HEP as assigned to facilitate [place]ver of treatment strategies and techniques in home environment.    Consulted and Agree with Plan of Care  Patient       Patient will benefit from skilled therapeutic intervention in order to improve the following deficits and impairments:   Dysphagia, oropharyngeal phase    Problem List Patient Active Problem List   Diagnosis Date Noted  . Protein-calorie malnutrition, severe 08/25/2018  . Neutropenic fever ([place]) 08/24/2018  . Hyperlipidemia 08/24/2018  . Pancytopenia ([place]) 08/24/2018  . Hyponatremia 08/24/2018  . Hyperbilirubinemia 08/24/2018  . Problems with swallowing and mastication   . Cellulitis and perichondritis of larynx   . Chronic periodontitis 06/10/2018  . Partial loss of teeth, unspecified edentulism 06/10/2018  . Tonsillar cancer ([place]) 06/02/2018  . Mass of oral cavity 05/12/2018  . Positive FIT (fecal immunochemical test) 05/12/2018   Thank you,  Genene Churn, [place] 10/19/2018, 10:07 AM  [facility] [address] [place], [place], [address] Phone: [phone]   Fax:  [phone]   Name: [name] MRN: [id number] Date of Birth: 07-Jun-1951  [facility] [place], [place], [address] Phone: [phone]   Fax:  [phone]  Speech Language Pathology Treatment  Patient Details  Name: [name] MRN: [id number] Date of Birth: 11/30/1951 No data recorded  Encounter Date: 10/19/2018  End of Session - 10/19/18 1005    Visit Number  2    Number of Visits  4    Date for SLP Re-Evaluation  11/11/18    Authorization Type  UHC Medicare   $40 copay until deductible met and then covered at 100%   SLP Start Time  0915    SLP Stop Time   1000    SLP Time Calculation (min)  45 min    Activity Tolerance  Patient tolerated treatment well       Past Medical History:  Diagnosis Date  . HOH (hard of hearing)   . Hyperlipidemia   . Mass of oral cavity 05/12/2018  . Positive FIT (fecal immunochemical test) 05/12/2018  . PTSD (post-traumatic stress disorder)    has not been offically diagnosised  . Tonsillar cancer ([place]) 06/02/2018    Past Surgical History:  Procedure Laterality Date  . APPENDECTOMY  1969  . COLONOSCOPY W/ POLYPECTOMY     remote past  . ESOPHAGOGASTRODUODENOSCOPY  08/04/2018   Dr. Arnoldo Morale: erythema of larynx, normal stomach, normal duodenum. PEG Placed 20 F, bumper at 2.5 cm  . ESOPHAGOGASTRODUODENOSCOPY (EGD) WITH PROPOFOL N/A 08/04/2018   Procedure: ESOPHAGOGASTRODUODENOSCOPY (EGD) WITH PROPOFOL;  Surgeon: Aviva Signs, MD;  [facility];  Service: Gastroenterology;  Laterality: N/A;  . FINGER FRACTURE SURGERY Left   . MULTIPLE EXTRACTIONS WITH ALVEOLOPLASTY N/A 06/15/2018   Procedure: Extraction of tooth #'s 2,12,13,18,20,and 21 with alveoloplasty and gross debridement of remaining teeth;  Surgeon: Lenn Cal, DDS;  Location: [facility];  Service: Oral Surgery;  Laterality: N/A;  . PEG PLACEMENT N/A 08/04/2018   Procedure: PERCUTANEOUS ENDOSCOPIC GASTROSTOMY (PEG) PLACEMENT;  Surgeon: Aviva Signs, MD;  [facility];  Service:  Gastroenterology;  Laterality: N/A;  . PORTACATH PLACEMENT Left 07/02/2018   Procedure: INSERTION PORT-A-CATH;  Surgeon: Aviva Signs, MD;  [facility];  Service: General;  Laterality: Left;    There were no vitals filed for this visit.  Subjective Assessment - 10/19/18 1001    Subjective  "You guys think I look better, but it is hard for me to see that."    Currently in Pain?  No/denies      ADULT SLP TREATMENT - 10/19/18 0001      General Information   Behavior/Cognition  Alert;Cooperative;Pleasant mood    Patient Positioning  Upright in chair    Oral care provided  N/A    HPI  [name] is a 67 yo male who was referred by Dr. 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  He is not a candidate for high-dose cisplatin given his chronic kidney disease and hearing dysfunction.  He is also not a candidate for weekly cisplatin as his creatinine might get worse. He will have the recommended 94-01 [place] protocol with 5-FU administered as a 24-hour continuous infusion at a dose of 600 mg per M square per day for 4 days and carboplatin given as a daily bolus of 70 mg/m2/day for 4 days.  Chemo will be given on days  [facility] [place], [place], [address] Phone: [phone]   Fax:  [phone]  Speech Language Pathology Treatment  Patient Details  Name: [name] MRN: [id number] Date of Birth: 11/30/1951 No data recorded  Encounter Date: 10/19/2018  End of Session - 10/19/18 1005    Visit Number  2    Number of Visits  4    Date for SLP Re-Evaluation  11/11/18    Authorization Type  UHC Medicare   $40 copay until deductible met and then covered at 100%   SLP Start Time  0915    SLP Stop Time   1000    SLP Time Calculation (min)  45 min    Activity Tolerance  Patient tolerated treatment well       Past Medical History:  Diagnosis Date  . HOH (hard of hearing)   . Hyperlipidemia   . Mass of oral cavity 05/12/2018  . Positive FIT (fecal immunochemical test) 05/12/2018  . PTSD (post-traumatic stress disorder)    has not been offically diagnosised  . Tonsillar cancer ([place]) 06/02/2018    Past Surgical History:  Procedure Laterality Date  . APPENDECTOMY  1969  . COLONOSCOPY W/ POLYPECTOMY     remote past  . ESOPHAGOGASTRODUODENOSCOPY  08/04/2018   Dr. Arnoldo Morale: erythema of larynx, normal stomach, normal duodenum. PEG Placed 20 F, bumper at 2.5 cm  . ESOPHAGOGASTRODUODENOSCOPY (EGD) WITH PROPOFOL N/A 08/04/2018   Procedure: ESOPHAGOGASTRODUODENOSCOPY (EGD) WITH PROPOFOL;  Surgeon: Aviva Signs, MD;  [facility];  Service: Gastroenterology;  Laterality: N/A;  . FINGER FRACTURE SURGERY Left   . MULTIPLE EXTRACTIONS WITH ALVEOLOPLASTY N/A 06/15/2018   Procedure: Extraction of tooth #'s 2,12,13,18,20,and 21 with alveoloplasty and gross debridement of remaining teeth;  Surgeon: Lenn Cal, DDS;  Location: [facility];  Service: Oral Surgery;  Laterality: N/A;  . PEG PLACEMENT N/A 08/04/2018   Procedure: PERCUTANEOUS ENDOSCOPIC GASTROSTOMY (PEG) PLACEMENT;  Surgeon: Aviva Signs, MD;  [facility];  Service:  Gastroenterology;  Laterality: N/A;  . PORTACATH PLACEMENT Left 07/02/2018   Procedure: INSERTION PORT-A-CATH;  Surgeon: Aviva Signs, MD;  [facility];  Service: General;  Laterality: Left;    There were no vitals filed for this visit.  Subjective Assessment - 10/19/18 1001    Subjective  "You guys think I look better, but it is hard for me to see that."    Currently in Pain?  No/denies      ADULT SLP TREATMENT - 10/19/18 0001      General Information   Behavior/Cognition  Alert;Cooperative;Pleasant mood    Patient Positioning  Upright in chair    Oral care provided  N/A    HPI  [name] is a 67 yo male who was referred by Dr. 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  He is not a candidate for high-dose cisplatin given his chronic kidney disease and hearing dysfunction.  He is also not a candidate for weekly cisplatin as his creatinine might get worse. He will have the recommended 94-01 [place] protocol with 5-FU administered as a 24-hour continuous infusion at a dose of 600 mg per M square per day for 4 days and carboplatin given as a daily bolus of 70 mg/m2/day for 4 days.  Chemo will be given on days

## 2018-10-19 NOTE — Patient Instructions (Signed)
[facility] at [facility] Discharge Instructions  Follow up in 6 weeks with labs .   Thank you for choosing [facility] at [facility] to provide your oncology and hematology care.  To afford each patient quality time with our provider, please arrive at least 15 minutes before your scheduled appointment time.   If you have a lab appointment with the Cancer Center please come in thru the  Main Entrance and check in at the main information desk  You need to re-schedule your appointment should you arrive 10 or more minutes late.  We strive to give you quality time with our providers, and arriving late affects you and other patients whose appointments are after yours.  Also, if you no show three or more times for appointments you may be dismissed from the clinic at the providers discretion.     Again, thank you for choosing [facility].  Our hope is that these requests will decrease the amount of time that you wait before being seen by our physicians.       _____________________________________________________________  Should you have questions after your visit to [facility], please contact our office at [phone] between the hours of 8:00 a.m. and 4:30 p.m.  Voicemails left after 4:00 p.m. will not be returned until the following business day.  For prescription refill requests, have your pharmacy contact our office and allow 72 hours.    Cancer Center Support Programs:   > Cancer Support Group  2nd Tuesday of the month 1pm-2pm, Journey Room    

## 2018-10-19 NOTE — Assessment & Plan Note (Signed)
1.  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 Benjamine Mola on 05/13/2018, status post biopsy consistent with squamous cell carcinoma, P 16+ - Baseline hearing loss and chronic kidney disease with creatinine of 1.34. - Pretreatment oropharyngeal examination shows a necrotic left tonsillar mass, not clearly visualized secondary to increased gag reflex.  There is a left lower neck lymph node palpable.  PET CT scan showed level 2, 3, 5 metastatic nodes on the left neck and mildly metabolic right level 2 lymph node along with the left tonsillar primary.   - He received 3 cycles of carboplatin and 5-FU and radiation therapy from 07/13/2018 through 09/13/2018. - He was hospitalized after cycle 2 with severe mucositis and neutropenic fever. - He is continuing to improve in terms of energy. -I have reviewed results of his CT scan of the neck dated 10/15/2018 which shows postradiation changes in the neck with increased preepiglottic edema.  No evidence of new or progressive disease.  Decrease in size of the left neck lymph nodes. -I will reevaluate him in 4 to 6 weeks to see how he is swallowing.  I plan to repeat his CT scan in 3 to 6 months.  We will also check his TSH level every 6 to 12 months.  His follow-up visits will be once every 3 to 4 months during year 1 with head and neck examination.  2.  Nutrition: -He is taking 2 cans of Osmolite 3 times a day via PEG tube. - He is doing swallow therapy.  He is still unable to swallow any liquids.  I have reviewed his modified barium swallow.

## 2018-10-19 NOTE — Progress Notes (Signed)
[address], [place] [id number]   CLINIC:  Medical Oncology/Hematology  PCP:  [name], DO [place] [address] [phone]   REASON FOR VISIT: Follow-up for tonsillar cancer  CURRENT THERAPY: Observation per NCCN guidelines.Marland Kitchen   BRIEF ONCOLOGIC HISTORY:    Tonsillar cancer ([place])   06/02/2018 Initial Diagnosis    Tonsillar cancer ([place])    06/15/2018 -  Chemotherapy    The patient had palonosetron (ALOXI) injection 0.25 mg, 0.25 mg, Intravenous,  Once, 3 of 3 cycles Administration: 0.25 mg (07/13/2018), 0.25 mg (07/15/2018), 0.25 mg (08/10/2018), 0.25 mg (09/13/2018) CARBOplatin (PARAPLATIN) 140 mg in sodium chloride 0.9 % 100 mL chemo infusion, 70 mg/m2 = 140 mg (100 % of original dose 70 mg/m2), Intravenous,  Once, 3 of 3 cycles Dose modification: 70 mg/m2 (original dose 70 mg/m2, Cycle 1, Reason: Other (see comments), Comment: french protocol head and neck), 52.5 mg/m2 (75 % of original dose 70 mg/m2, Cycle 3, Reason: Provider Judgment) Administration: 140 mg (07/13/2018), 140 mg (07/14/2018), 140 mg (07/15/2018), 140 mg (07/19/2018), 140 mg (08/10/2018), 140 mg (08/11/2018), 140 mg (08/12/2018), 140 mg (08/13/2018), 140 mg (09/13/2018), 140 mg (09/14/2018), 100 mg (09/15/2018), 100 mg (09/16/2018) fluorouracil (ADRUCIL) 4,750 mg in sodium chloride 0.9 % 55 mL chemo infusion, 600 mg/m2/day = 4,750 mg (100 % of original dose 600 mg/m2/day), Intravenous, 4D (96 hours ), 3 of 3 cycles Dose modification: 600 mg/m2/day (original dose 600 mg/m2/day, Cycle 1, Reason: Other (see comments), Comment: french protocol head and neck) Administration: 4,750 mg (07/13/2018), 4,750 mg (08/10/2018), 4,750 mg (09/13/2018)  for chemotherapy treatment.       CANCER STAGING: Cancer Staging Tonsillar cancer ([place]) Staging form: Pharynx - HPV-Mediated Oropharynx, AJCC 8th Edition - Clinical stage from 06/02/2018: Stage II (cT3, cN2, cM0) -  Unsigned    INTERVAL HISTORY:  Mr. Bohlken 67 y.o. male returns for routine follow-up for tonsillar cancer. Patient is here today and is looking healthier. He still has mouth sores and trouble swallowing. He is having to spit out all his saliva due to it being hard to swallow. He is currently still taking in 6 cans of tube feeding. He denies any nausea, vomiting, or diarrhea. Denies any new pains or lumps. Denies any issues with his feeding tube. He reports his energy level at 50%.     REVIEW OF SYSTEMS:  Review of Systems  HENT:   Positive for mouth sores and trouble swallowing.   All other systems reviewed and are negative.    PAST MEDICAL/SURGICAL HISTORY:  Past Medical History:  Diagnosis Date  . HOH (hard of hearing)   . Hyperlipidemia   . Mass of oral cavity 05/12/2018  . Positive FIT (fecal immunochemical test) 05/12/2018  . PTSD (post-traumatic stress disorder)    has not been offically diagnosised  . Tonsillar cancer ([place]) 06/02/2018   Past Surgical History:  Procedure Laterality Date  . APPENDECTOMY  1969  . COLONOSCOPY W/ POLYPECTOMY     remote past  . ESOPHAGOGASTRODUODENOSCOPY  08/04/2018   Dr. Arnoldo Morale: erythema of larynx, normal stomach, normal duodenum. PEG Placed 20 F, bumper at 2.5 cm  . ESOPHAGOGASTRODUODENOSCOPY (EGD) WITH PROPOFOL N/A 08/04/2018   Procedure: ESOPHAGOGASTRODUODENOSCOPY (EGD) WITH PROPOFOL;  Surgeon: Aviva Signs, MD;  [facility];  Service: Gastroenterology;  Laterality: N/A;  . FINGER FRACTURE SURGERY Left   . MULTIPLE EXTRACTIONS WITH ALVEOLOPLASTY N/A 06/15/2018   Procedure: Extraction of tooth #'s 2,12,13,18,20,and 21 with  alveoloplasty and gross debridement of remaining teeth;  Surgeon: Lenn Cal, DDS;  Location: [facility];  Service: Oral Surgery;  Laterality: N/A;  . PEG PLACEMENT N/A 08/04/2018   Procedure: PERCUTANEOUS ENDOSCOPIC GASTROSTOMY (PEG) PLACEMENT;  Surgeon: Aviva Signs, MD;  [facility];  Service:  Gastroenterology;  Laterality: N/A;  . PORTACATH PLACEMENT Left 07/02/2018   Procedure: INSERTION PORT-A-CATH;  Surgeon: Aviva Signs, MD;  Location: [facility];  Service: General;  Laterality: Left;     SOCIAL HISTORY:  Social History   Socioeconomic History  . Marital status: Divorced    Spouse name: Not on file  . Number of children: Not on file  . Years of education: Not on file  . Highest education level: Not on file  Occupational History  . Occupation: English as a second language teacher, retired  Scientific laboratory technician  . Financial resource strain: Not on file  . Food insecurity:    Worry: Not on file    Inability: Not on file  . Transportation needs:    Medical: Not on file    Non-medical: Not on file  Tobacco Use  . Smoking status: Former Smoker    Years: 15.00    Types: Cigarettes    Last attempt to quit: 2000    Years since quitting: 19.8  . Smokeless tobacco: Never Used  Substance and Sexual Activity  . Alcohol use: Yes    Alcohol/week: 12.0 standard drinks    Types: 12 Cans of beer per week  . Drug use: Never  . Sexual activity: Not Currently  Lifestyle  . Physical activity:    Days per week: Not on file    Minutes per session: Not on file  . Stress: Not on file  Relationships  . Social connections:    Talks on phone: Not on file    Gets together: Not on file    Attends religious service: Not on file    Active member of club or organization: Not on file    Attends meetings of clubs or organizations: Not on file    Relationship status: Not on file  . Intimate partner violence:    Fear of current or ex partner: Not on file    Emotionally abused: Not on file    Physically abused: Not on file    Forced sexual activity: Not on file  Other Topics Concern  . Not on file  Social History Narrative   Mr. Anding is a pleasant gentleman who is a retired English as a second language teacher.  He lives independently with his dog here in [place].  He formally lived in [place].    FAMILY HISTORY:  Family History  Problem  Relation Age of Onset  . Diabetes Father        died at age 53   . Colon cancer Neg Hx     CURRENT MEDICATIONS:  Outpatient Encounter Medications as of 10/19/2018  Medication Sig  . Amino Acids-Protein Hydrolys (FEEDING SUPPLEMENT, PRO-STAT SUGAR FREE 64,) LIQD Place 30 mLs into feeding tube daily.  Marland Kitchen ibuprofen (ADVIL,MOTRIN) 200 MG tablet Take 400 mg by mouth every 6 (six) hours as needed for moderate pain.   Marland Kitchen lidocaine (XYLOCAINE) 2 % solution Viscous lidocaine 2%/ Maalox 1:1 mixture. Swish and swallow 1 tablespoon four times a day100 (Patient taking differently: Use as directed 15 mLs in the mouth or throat 4 (four) times daily. Viscous lidocaine 2%/ Maalox 1:1 mixture. Swish and swallow 1 tablespoon four times a day100)  . lidocaine-prilocaine (EMLA) cream Apply to affected area  once (Patient not taking: Reported on 10/19/2018)  . Morphine Sulfate (MORPHINE CONCENTRATE) 10 mg / 0.5 ml concentrated solution Take 0.5 mLs (10 mg total) by mouth every 3 (three) hours as needed for severe pain.  . Nutritional Supplements (FEEDING SUPPLEMENT, OSMOLITE 1.5 CAL,) LIQD Place 237 mLs into feeding tube 2 (two) times daily.  Marland Kitchen oxyCODONE-acetaminophen (PERCOCET) 5-325 MG tablet Take one tablet by mouth every 4 hours as needed for pain.  Marland Kitchen prochlorperazine (COMPAZINE) 10 MG tablet Take 1 tablet (10 mg total) by mouth every 6 (six) hours as needed (Nausea or vomiting). (Patient not taking: Reported on 10/19/2018)  . sodium fluoride (FLUORISHIELD) 1.1 % GEL dental gel Instill one drop of gel per tooth space of fluoride tray. Place over teeth for 5 minutes. Remove. Spit out excess. Repeat nightly. (Patient taking differently: Place 1 drop onto teeth See admin instructions. Instill one drop of gel per tooth space of fluoride tray. Place over teeth for 5 minutes. Remove. Spit out excess. Repeat nightly.)  . sucralfate (CARAFATE) 1 GM/10ML suspension Carafate 1gm/77ml & Viscous lidocaine 2% 1:1 mixture.  Swish  and swallow 1 tablespoon four times a day. (Patient taking differently: Take by mouth 4 (four) times daily. Carafate 1gm/20ml & Viscous lidocaine 2% 1:1 mixture.  Swish and swallow 1 tablespoon four times a day.)   No facility-administered encounter medications on file as of 10/19/2018.     ALLERGIES:  No Known Allergies   PHYSICAL EXAM:  ECOG Performance status: 1  Vitals:   10/19/18 0845  BP: 123/66  Pulse: (!) 103  Resp: 16  Temp: 98.7 F (37.1 C)  SpO2: 96%   Filed Weights   10/19/18 0845  Weight: 152 lb (68.9 kg)    Physical Exam  Constitutional: He is oriented to person, place, and time. He appears well-developed and well-nourished.  HENT:  Mouth/Throat: Oropharynx is clear and moist.  Neck: Normal range of motion. Neck supple.  Cardiovascular: Normal rate, regular rhythm and normal heart sounds.  Pulmonary/Chest: Effort normal and breath sounds normal.  Musculoskeletal: Normal range of motion.  Neurological: He is alert and oriented to person, place, and time.  Skin: Skin is warm and dry.  No adenopathy in the neck was noted.  Oropharyngeal exam was limited due to trismus.  Left tonsillar mass was not visualized.  Buccal ulceration present.   LABORATORY DATA:  I have reviewed the labs as listed.  CBC    Component Value Date/Time   WBC 2.1 (L) 10/15/2018 0826   RBC 3.18 (L) 10/15/2018 0826   HGB 10.1 (L) 10/15/2018 0826   HGB 14.2 05/12/2018 0949   HCT 33.0 (L) 10/15/2018 0826   HCT 42.0 05/12/2018 0949   PLT 178 10/15/2018 0826   PLT 256 05/12/2018 0949   MCV 103.8 (H) 10/15/2018 0826   MCV 87 05/12/2018 0949   MCH 31.8 10/15/2018 0826   MCHC 30.6 10/15/2018 0826   RDW 15.7 (H) 10/15/2018 0826   RDW 13.0 05/12/2018 0949   LYMPHSABS 0.5 (L) 10/15/2018 0826   LYMPHSABS 1.1 05/12/2018 0949   MONOABS 0.3 10/15/2018 0826   EOSABS 0.1 10/15/2018 0826   EOSABS 0.1 05/12/2018 0949   BASOSABS 0.0 10/15/2018 0826   BASOSABS 0.0 05/12/2018 0949   CMP  Latest Ref Rng & Units 10/15/2018 09/27/2018 09/10/2018  Glucose 70 - 99 mg/dL 107(H) 106(H) 126(H)  BUN 8 - 23 mg/dL 11 9 13   Creatinine 0.61 - 1.24 mg/dL 0.74 0.71 0.73  Sodium 135 - 145 mmol/L  140 138 140  Potassium 3.5 - 5.1 mmol/L 4.5 3.6 3.7  Chloride 98 - 111 mmol/L 103 99 100  CO2 22 - 32 mmol/L 30 32 33(H)  Calcium 8.9 - 10.3 mg/dL 9.4 9.3 9.0  Total Protein 6.5 - 8.1 g/dL 6.9 6.8 6.9  Total Bilirubin 0.3 - 1.2 mg/dL 0.9 1.2 0.7  Alkaline Phos 38 - 126 U/L 55 52 58  AST 15 - 41 U/L 16 18 18   ALT 0 - 44 U/L 12 16 13        DIAGNOSTIC IMAGING:  I have independently reviewed images of the CT scan dated 10/15/2018 and discussed with him.     ASSESSMENT & PLAN:   Tonsillar cancer ([place]) 1.  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 Benjamine Mola on 05/13/2018, status post biopsy consistent with squamous cell carcinoma, P 16+ - Baseline hearing loss and chronic kidney disease with creatinine of 1.34. - Pretreatment oropharyngeal examination shows a necrotic left tonsillar mass, not clearly visualized secondary to increased gag reflex.  There is a left lower neck lymph node palpable.  PET CT scan showed level 2, 3, 5 metastatic nodes on the left neck and mildly metabolic right level 2 lymph node along with the left tonsillar primary.   - He received 3 cycles of carboplatin and 5-FU and radiation therapy from 07/13/2018 through 09/13/2018. - He was hospitalized after cycle 2 with severe mucositis and neutropenic fever. - He is continuing to improve in terms of energy. -I have reviewed results of his CT scan of the neck dated 10/15/2018 which shows postradiation changes in the neck with increased preepiglottic edema.  No evidence of new or progressive disease.  Decrease in size of the left neck lymph nodes. -I will reevaluate him in 4 to 6 weeks to see how he is swallowing.  I  plan to repeat his CT scan in 3 to 6 months.  We will also check his TSH level every 6 to 12 months.  His follow-up visits will be once every 3 to 4 months during year 1 with head and neck examination.  2.  Nutrition: -He is taking 2 cans of Osmolite 3 times a day via PEG tube. - He is doing swallow therapy.  He is still unable to swallow any liquids.  I have reviewed his modified barium swallow.      Orders placed this encounter:  Orders Placed This Encounter  Procedures  . Magnesium  . CBC with Differential/Platelet  . Comprehensive metabolic panel  . Phosphorus  . TSH      Derek Jack, [place] [phone]

## 2018-10-20 ENCOUNTER — Ambulatory Visit (HOSPITAL_COMMUNITY): Payer: Medicare Other | Admitting: Speech Pathology

## 2018-10-25 ENCOUNTER — Telehealth (HOSPITAL_COMMUNITY): Payer: Self-pay | Admitting: Speech Pathology

## 2018-10-25 NOTE — Telephone Encounter (Signed)
Patient called on Friday after lunch wanting to talk to [place]. Pt states something is wrong and he was sick on the stomack. I advised pt to call his MD or go to the ED. NF 10/22/2018

## 2018-10-25 NOTE — Telephone Encounter (Signed)
Spoke with Dabney today 10/25/18 and let her know that Mr. Sipos had called on Friday. She will check on him in the hospital on his next visit. NF 10/25/18

## 2018-10-26 ENCOUNTER — Ambulatory Visit (HOSPITAL_COMMUNITY): Payer: Medicare Other | Attending: Hematology | Admitting: Speech Pathology

## 2018-10-26 ENCOUNTER — Inpatient Hospital Stay (HOSPITAL_COMMUNITY): Payer: Medicare Other | Attending: Hematology | Admitting: Dietician

## 2018-10-26 ENCOUNTER — Encounter (HOSPITAL_COMMUNITY): Payer: Self-pay | Admitting: Speech Pathology

## 2018-10-26 DIAGNOSIS — R1312 Dysphagia, oropharyngeal phase: Secondary | ICD-10-CM

## 2018-10-26 NOTE — Progress Notes (Signed)
Nutrition Follow-up 67 y/o male PMHx HLD, HOH, CKD, w/ little recent medical follow up. Presented to [facility] 6/12 after referred by local ENT MD for large L tonsillar mass s/p biopsy. + for SCC.  Begun radiation 7/22 and chemo 7/23. S/P peg placement 8/14 and start of Tube feeding 8/16. Finished radiation 9/12 and chemo 9/25.   RD following up regarding pts PEG feeding s/p chemoradiation.  Today he says his TF regimen varies day-to-day depending on how he is feeling. On average, he says he infuses 4 cans of Osmolite 1.5/d and drinks 2 cans of Ensure/day. He is infusing ~"2 bottles of water (32oz)".   This provides: 2122 kcals, 100 g Pro and 1084 mls free water (+960 from water flushes)   He says he got a terrible stomach ache this past weekend and believes it was a result of pushing too much TF/Ensure. He now has resolved to only giving himself "what I think I can tolerate". Other than this episode, he denies any n/v/c/d. He says his BMs have been more formed recently.   He is still not taking anything by mouth. He still has a significant amount of secretions which he says come from "lesions" in his mouth. He has been using oral rinses to minimal effect. He is quite frustrated by his lack of progress. He is working with ST and will see SLP immediately following RD appt.   Weighed patient today. He was 152 lbs. This is the same weight as last week. Wt has been stable 150-152 x1 month.   Wt Readings from Last 10 Encounters:  10/26/18 152 lb (68.9 kg)  10/19/18 152 lb (68.9 kg)  10/15/18 150 lb 9 oz (68.3 kg)  09/27/18 151 lb (68.5 kg)  09/17/18 157 lb (71.2 kg)  09/16/18 156 lb 12.8 oz (71.1 kg)  09/15/18 157 lb 1.6 oz (71.3 kg)  09/14/18 155 lb (70.3 kg)  09/13/18 154 lb 12.8 oz (70.2 kg)  09/10/18 158 lb 6.4 oz (71.8 kg)   MEDICATIONS: Chemo: COMPLETED Other Meds: Liquid morphine, Percocet, Carafate, Compazine  LABS:  None since last seen by RD  ANTHROPOMETRICS: Height:  Ht Readings  from Last 1 Encounters:  08/24/18 '5\' 8"'  (1.727 m)   Weight:  Wt Readings from Last 1 Encounters:  10/26/18 152 lb (68.9 kg)   BMI:  BMI Readings from Last 1 Encounters:  10/26/18 23.11 kg/m   UBW: 182 when presenting In June IBW: 70 kg Wt change: Stable x1 month. - 30 lbs since June (16% in 5 months)  Re-Estimated needs:  Energy: 1950-2150 kcals (28-31 kcal/kg bw) Protein:83-96g Pro (1.2-1.4 g/kg bw) Fluid: >2.1  L fluid (30 ml/kcal)   NUTRITION DIAGNOSIS:  Inability to eat related to side effects of chemoradiation as patient report of minimal PO intake and need for PEG   DOCUMENTATION CODES:  Not applicable at this time.   INTERVENTION:   Pt is currently maintaining weight. He largely does what he wants with his tube feeding and adjusts it based off of how he is feeling. He feels his episodes of stomach pain arise from overfeeding  He is quite frustrated by lack of progress. Provided continued encouragement.   Using his reported AVERAGE intakes of osmolite and Ensure, he is meeting needs.  He is to continue with 4 cans of Osmolite 1.5 and 2 cans of Ensure/day, w/ flushes equating to "2 bottles of water (32oz)".   This regimen provides: 2122 kcals, 100 g Pro and 1084 mls free water (+960  from water flushes)   At this time, we are unable to begin weaning TF. Will continue to wait on return of swallow function and SLPs reeccomendations  GOAL:  Patient will meet greater than or equal to 90% of their needs  MET  MONITOR:  Labs, Weight trends, TF tolerance, I & O's, PO intake, swallow function  Next Visit:  Remote x 2 weeks.   [name] RD, LDN, CNSC Clinical Nutrition Available Tues-Sat via Pager: [phone] 10/26/2018 11:39 AM

## 2018-10-26 NOTE — Therapy (Signed)
[facility] [address] [place], [place], [address] Phone: [phone]   Fax:  [phone]  Speech Language Pathology Treatment  Patient Details  Name: [name] MRN: [id number] Date of Birth: 25-Dec-1950 No data recorded  Encounter Date: 10/26/2018  End of Session - 10/26/18 1816    Visit Number  3    Number of Visits  4    Date for SLP Re-Evaluation  11/11/18    Authorization Type  UHC Medicare   $40 copay until deductible met and then covered at 100%   SLP Start Time  1100    SLP Stop Time   1140    SLP Time Calculation (min)  40 min    Activity Tolerance  Patient tolerated treatment well       Past Medical History:  Diagnosis Date  . HOH (hard of hearing)   . Hyperlipidemia   . Mass of oral cavity 05/12/2018  . Positive FIT (fecal immunochemical test) 05/12/2018  . PTSD (post-traumatic stress disorder)    has not been offically diagnosised  . Tonsillar cancer (HCC) 06/02/2018    Past Surgical History:  Procedure Laterality Date  . APPENDECTOMY  1969  . COLONOSCOPY W/ POLYPECTOMY     remote past  . ESOPHAGOGASTRODUODENOSCOPY  08/04/2018   Dr. Lovell Sheehan: erythema of larynx, normal stomach, normal duodenum. PEG Placed 20 F, bumper at 2.5 cm  . ESOPHAGOGASTRODUODENOSCOPY (EGD) WITH PROPOFOL N/A 08/04/2018   Procedure: ESOPHAGOGASTRODUODENOSCOPY (EGD) WITH PROPOFOL;  Surgeon: Franky Macho, MD;  [facility];  Service: Gastroenterology;  Laterality: N/A;  . FINGER FRACTURE SURGERY Left   . MULTIPLE EXTRACTIONS WITH ALVEOLOPLASTY N/A 06/15/2018   Procedure: Extraction of tooth #'s 2,12,13,18,20,and 21 with alveoloplasty and gross debridement of remaining teeth;  Surgeon: Charlynne Pander, DDS;  Location: [facility] OR;  Service: Oral Surgery;  Laterality: N/A;  . PEG PLACEMENT N/A 08/04/2018   Procedure: PERCUTANEOUS ENDOSCOPIC GASTROSTOMY (PEG) PLACEMENT;  Surgeon: Franky Macho, MD;  [facility];  Service:  Gastroenterology;  Laterality: N/A;  . PORTACATH PLACEMENT Left 07/02/2018   Procedure: INSERTION PORT-A-CATH;  Surgeon: Franky Macho, MD;  Location: [facility];  Service: General;  Laterality: Left;    There were no vitals filed for this visit.  Subjective Assessment - 10/26/18 1814    Subjective  "This sucks."    Currently in Pain?  No/denies            ADULT SLP TREATMENT - 10/26/18 0001      General Information   Behavior/Cognition  Alert;Cooperative;Pleasant mood    Patient Positioning  Upright in chair    Oral care provided  N/A    HPI  [name] is a 67 yo male who was referred by Dr. 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  He is not a candidate for high-dose cisplatin given his chronic kidney disease and hearing dysfunction.  He is also not a candidate for weekly cisplatin as his creatinine might get worse. He will have the recommended 94-01 [place] protocol with 5-FU administered as a 24-hour continuous infusion at a dose of 600 mg per M square per day for 4 days and carboplatin given as a daily bolus of 70 mg/m2/day for 4 days.  Chemo will be given on days 1, 22 and 43. Radiation started 07/12/18  and will go until 08/26/2018 for 5 days per week.  [name] lives alone in [place] with his dog. He received 3 cycles of carboplatin and 5-FU and radiation therapy from 07/13/2018 through 09/13/2018. He was hospitalized after cycle 2 with severe mucositis and neutropenic fever. [name] was seen for a clinical swallow evaluation in July, however he failed to return to the clinic for therapy due to cost restraints. He was seen for a courtesy visit in the interim. [name], [name] referred Pt for MBSS due to Pt unable to safely take food by mouth at this time.      Treatment Provided   Treatment provided   Dysphagia      Dysphagia Treatment   Temperature Spikes Noted  No    Respiratory Status  Room air    Oral Cavity - Dentition  Adequate natural dentition;Missing dentition    Treatment Methods  Skilled observation;Therapeutic exercise    Patient observed directly with PO's  Yes    Type of PO's observed  Ice chips;Thin liquids   water   Feeding  Able to feed self    Liquids provided via  Cup;Teaspoon    Oral Phase Signs & Symptoms  Oral holding    Pharyngeal Phase Signs & Symptoms  Suspected delayed swallow initiation;Multiple swallows;Audible swallow;Immediate throat clear    Type of cueing  Verbal;Tactile;Visual    Amount of cueing  Minimal      Assessment / Recommendations / Plan   Plan  Continue with current plan of care       SLP Education - 10/26/18 1815    Education Details  continue with oral care, ice chips, water, HEP (Mendelsohn, CTAR), and trismus device    Person(s) Educated  Patient    Methods  Explanation;Demonstration    Comprehension  Verbalized understanding       SLP Short Term Goals - 10/26/18 1819      SLP SHORT TERM GOAL #1   Title  Pt will complete oral motor and pharyngeal strengthening and ROM exercises as assigned 3x per day x 10-15 each on 5 of 7 days per week by Pt report and with use of written cues.    Baseline  does not have program in place    Time  3    Period  Months    Status  On-going      SLP SHORT TERM GOAL #2   Title  Pt will identify and verbalize 3+ signs/symptoms of aspiration and potential repurcussions after education provided by SLP.     Baseline  Introduced during today's evaluation     Time  3    Period  Months    Status  On-going      SLP SHORT TERM GOAL #3   Title  Will complete objective assessment via MBSS as clinically indicated.    Baseline  initial completed 10/05/2018    Status  On-going       SLP Long Term Goals - 10/26/18 1820      SLP LONG TERM GOAL #1   Title  Same as short term goals       Plan -  10/26/18 1816    Clinical Impression Statement  SLP provided skilled treatment targeting dysphagia and trismus goals. Pt reports only minimal intake of water/ice chips at home and he was encouraged to continue at home. Pt with seemingly increased hyolaryngeal excursion with each swallow today. He continues to expectorate oral secretions, but also attempting to swallow more  frequently. He was successful in implementing the [place] and was encouraged to continue with this and CTAR at home in addition to using the trismus device. SLP will see Pt again next week and will attempt additional po trials and update device as needed.    Speech Therapy Frequency  1x /week    Duration  4 weeks    Treatment/Interventions  Aspiration precaution training;Compensatory strategies;Pharyngeal strengthening exercises;Patient/family education;SLP instruction and feedback;Compensatory techniques    Potential Considerations  Financial resources    SLP Home Exercise Plan  Pt will complete HEP as assigned to facilitate carrover of treatment strategies and techniques in home environment.    Consulted and Agree with Plan of Care  Patient       Patient will benefit from skilled therapeutic intervention in order to improve the following deficits and impairments:   Dysphagia, oropharyngeal phase    Problem List Patient Active Problem List   Diagnosis Date Noted  . Protein-calorie malnutrition, severe 08/25/2018  . Neutropenic fever (HCC) 08/24/2018  . Hyperlipidemia 08/24/2018  . Pancytopenia (HCC) 08/24/2018  . Hyponatremia 08/24/2018  . Hyperbilirubinemia 08/24/2018  . Problems with swallowing and mastication   . Cellulitis and perichondritis of larynx   . Chronic periodontitis 06/10/2018  . Partial loss of teeth, unspecified edentulism 06/10/2018  . Tonsillar cancer (HCC) 06/02/2018  . Mass of oral cavity 05/12/2018  . Positive FIT (fecal immunochemical test) 05/12/2018   Thank you,  Havery Moros,  CCC-SLP [phone]  Havery Moros 10/26/2018, 6:20 PM  [facility] [address] [place], [place], [address] Phone: [phone]   Fax:  [phone]   Name: [name] MRN: [id number] Date of Birth: Feb 24, 1951

## 2018-10-27 ENCOUNTER — Ambulatory Visit (HOSPITAL_COMMUNITY): Payer: Medicare Other | Admitting: Speech Pathology

## 2018-10-28 ENCOUNTER — Telehealth (HOSPITAL_COMMUNITY): Payer: Self-pay

## 2018-10-28 NOTE — Telephone Encounter (Signed)
Nutrition  Received message that patient was trying to reach RD, Ovid Curd.   Called patient  and left message ~1pm that RD was returning call regarding nutrition.  Ovid Curd is currently on vacation.     Tried to call patient back just now and unable to reach patient.  Mailbox is now full per voicemail message.    RD will continue to try and reach patient.  Cashawn Yanko B. Zenia Resides, [place], [place] Registered Dietitian [phone] (pager)

## 2018-10-29 ENCOUNTER — Telehealth (HOSPITAL_COMMUNITY): Payer: Self-pay

## 2018-10-29 NOTE — Telephone Encounter (Signed)
Nutrition  Called patient this am and left message on voicemail as was notified that patient was trying to reach [place], [place]. Ovid Curd currently on vacation. Provided call back number for patient on voicemail.  Barett Whidbee B. Zenia Resides, [place], [place] Registered Dietitian [phone] (pager)

## 2018-11-01 ENCOUNTER — Ambulatory Visit (INDEPENDENT_AMBULATORY_CARE_PROVIDER_SITE_OTHER): Payer: Self-pay | Admitting: Otolaryngology

## 2018-11-02 ENCOUNTER — Telehealth (HOSPITAL_COMMUNITY): Payer: Self-pay | Admitting: Speech Pathology

## 2018-11-02 NOTE — Telephone Encounter (Signed)
Pt called to speak to [facility], she was not in the office. Pt states Dabney called him and he did not want to leave a message. He states he will be at his next apptment. NF 11/02/18

## 2018-11-02 NOTE — Telephone Encounter (Signed)
Pt called Genene Churn back and he did not want to l/m

## 2018-11-03 ENCOUNTER — Ambulatory Visit (HOSPITAL_COMMUNITY): Payer: Medicare Other | Admitting: Speech Pathology

## 2018-11-03 ENCOUNTER — Encounter (HOSPITAL_COMMUNITY): Payer: Self-pay | Admitting: Speech Pathology

## 2018-11-03 DIAGNOSIS — R1312 Dysphagia, oropharyngeal phase: Secondary | ICD-10-CM

## 2018-11-03 NOTE — Therapy (Signed)
[facility] [address] [place], [place], [address] Phone: [phone]   Fax:  [phone]  Speech Language Pathology Treatment  Patient Details  Name: [name] MRN: [id number] Date of Birth: [birth year] No data recorded  Encounter Date: 11/03/2018  End of Session - 11/03/18 1306    Visit Number  4    Number of Visits  6    Date for SLP Re-Evaluation  12/14/18    Authorization Type  UHC Medicare   $40 copay until deductible met and then covered at 100%   SLP Start Time  1125    SLP Stop Time   1215    SLP Time Calculation (min)  50 min    Activity Tolerance  Patient tolerated treatment well       Past Medical History:  Diagnosis Date  . HOH (hard of hearing)   . Hyperlipidemia   . Mass of oral cavity 05/12/2018  . Positive FIT (fecal immunochemical test) 05/12/2018  . PTSD (post-traumatic stress disorder)    has not been offically diagnosised  . Tonsillar cancer (HCC) 06/02/2018    Past Surgical History:  Procedure Laterality Date  . APPENDECTOMY  1969  . COLONOSCOPY W/ POLYPECTOMY     remote past  . ESOPHAGOGASTRODUODENOSCOPY  08/04/2018   Dr. Lovell Sheehan: erythema of larynx, normal stomach, normal duodenum. PEG Placed 20 F, bumper at 2.5 cm  . ESOPHAGOGASTRODUODENOSCOPY (EGD) WITH PROPOFOL N/A 08/04/2018   Procedure: ESOPHAGOGASTRODUODENOSCOPY (EGD) WITH PROPOFOL;  Surgeon: Franky Macho, MD;  [facility];  Service: Gastroenterology;  Laterality: N/A;  . FINGER FRACTURE SURGERY Left   . MULTIPLE EXTRACTIONS WITH ALVEOLOPLASTY N/A 06/15/2018   Procedure: Extraction of tooth #'s 2,12,13,18,20,and 21 with alveoloplasty and gross debridement of remaining teeth;  Surgeon: Charlynne Pander, DDS;  Location: [facility] OR;  Service: Oral Surgery;  Laterality: N/A;  . PEG PLACEMENT N/A 08/04/2018   Procedure: PERCUTANEOUS ENDOSCOPIC GASTROSTOMY (PEG) PLACEMENT;  Surgeon: Franky Macho, MD;  [facility];  Service:  Gastroenterology;  Laterality: N/A;  . PORTACATH PLACEMENT Left 07/02/2018   Procedure: INSERTION PORT-A-CATH;  Surgeon: Franky Macho, MD;  Location: [facility];  Service: General;  Laterality: Left;    There were no vitals filed for this visit.  Subjective Assessment - 11/03/18 1301    Subjective  "I guess it is getting a little better, but it is hard for me to see it."    Currently in Pain?  No/denies       ADULT SLP TREATMENT - 11/03/18 0001      General Information   Behavior/Cognition  Alert;Cooperative;Pleasant mood    Patient Positioning  Upright in chair    Oral care provided  N/A    HPI  [name] is a 67 yo male who was referred by Dr. 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  He is not a candidate for high-dose cisplatin given his chronic kidney disease and hearing dysfunction.  He is also not a candidate for weekly cisplatin as his creatinine might get worse. He will have the recommended 94-01 [place] protocol with 5-FU administered as a 24-hour continuous infusion at a dose of 600 mg per M square per day for 4 days and carboplatin given as a daily bolus of 70 mg/m2/day for 4 days.  Chemo will be  given on days 1, 22 and 43. Radiation started 07/12/18 and will go until 08/26/2018 for 5 days per week.  [name] lives alone in [place] with his dog. He received 3 cycles of carboplatin and 5-FU and radiation therapy from 07/13/2018 through 09/13/2018. He was hospitalized after cycle 2 with severe mucositis and neutropenic fever. Mr. Romm was seen for a clinical swallow evaluation in July, however he failed to return to the clinic for therapy due to cost restraints. He was seen for a courtesy visit in the interim. [name] Bud, NP-C referred Pt for MBSS due to Pt unable to safely take food by mouth at this time.       Treatment Provided   Treatment provided  Dysphagia      Dysphagia Treatment   Temperature Spikes Noted  No    Respiratory Status  Room air    Oral Cavity - Dentition  Adequate natural dentition;Missing dentition    Treatment Methods  Skilled observation;Therapeutic exercise    Patient observed directly with PO's  Yes    Type of PO's observed  Thin liquids    Feeding  Able to feed self    Liquids provided via  Cup    Oral Phase Signs & Symptoms  Oral holding    Pharyngeal Phase Signs & Symptoms  Suspected delayed swallow initiation;Multiple swallows;Audible swallow;Immediate throat clear    Type of cueing  Verbal;Tactile;Visual    Amount of cueing  Minimal      Pain Assessment   Pain Assessment  No/denies pain      Progression Toward Goals   Progression toward goals  Progressing toward goals       SLP Education - 11/03/18 1305    Education Details  Provided Pt with exercise log for swallowing and trismus exercises; water sips    Person(s) Educated  Patient    Methods  Explanation;Handout;Demonstration    Comprehension  Verbalized understanding       SLP Short Term Goals - 11/03/18 1314      SLP SHORT TERM GOAL #1   Title  Pt will complete oral motor and pharyngeal strengthening and ROM exercises as assigned 3x per day x 10-15 each on 5 of 7 days per week by Pt report and with use of written cues.    Baseline  does not have program in place    Time  3    Period  Months    Status  On-going      SLP SHORT TERM GOAL #2   Title  Pt will identify and verbalize 3+ signs/symptoms of aspiration and potential repurcussions after education provided by SLP.     Baseline  Introduced during today's evaluation     Time  3    Period  Months    Status  On-going      SLP SHORT TERM GOAL #3   Title  Will complete objective assessment via MBSS as clinically indicated.    Baseline  initial completed 10/05/2018    Status  On-going       SLP Long Term Goals - 11/03/18 1314       SLP LONG TERM GOAL #1   Title  Same as short term goals       Plan - 11/03/18 1313    Clinical Impression Statement  SLP provided skilled treatment targeting dysphagia and trismus goals. SLP provided Pt with an exercise log including, water intake, trismus device, Mendelsohn, and chin press against ball. Pt succussfully completed each before  moving along to the next with min SLP cues for accurate implementation. SLP measured maximum intraoral opening to be 26mm this date (pre treatment with dentist was measured at 50mm using 25 tongue depressors). Pt reports use of trismus device created previous session and he was encouraged to use both that device and the modified one his dentist gave him (moved to 17 sticks) with the goal of adding one stick per week. Pt consumed 8 swallows water over the course of the session. SLP will see Pt again on 12/01/18 when he returns to the cancer clinic for a port flush and hopefully complete MBSS again the following week.     Speech Therapy Frequency  1x /week    Duration  4 weeks    Treatment/Interventions  Aspiration precaution training;Compensatory strategies;Pharyngeal strengthening exercises;Patient/family education;SLP instruction and feedback;Compensatory techniques    Potential Considerations  Financial resources    SLP Home Exercise Plan  Pt will complete HEP as assigned to facilitate carrover of treatment strategies and techniques in home environment.    Consulted and Agree with Plan of Care  Patient       Patient will benefit from skilled therapeutic intervention in order to improve the following deficits and impairments:   Dysphagia, oropharyngeal phase    Problem List Patient Active Problem List   Diagnosis Date Noted  . Protein-calorie malnutrition, severe 08/25/2018  . Neutropenic fever (HCC) 08/24/2018  . Hyperlipidemia 08/24/2018  . Pancytopenia (HCC) 08/24/2018  . Hyponatremia 08/24/2018  . Hyperbilirubinemia 08/24/2018  . Problems  with swallowing and mastication   . Cellulitis and perichondritis of larynx   . Chronic periodontitis 06/10/2018  . Partial loss of teeth, unspecified edentulism 06/10/2018  . Tonsillar cancer (HCC) 06/02/2018  . Mass of oral cavity 05/12/2018  . Positive FIT (fecal immunochemical test) 05/12/2018   Thank you,  Havery Moros, CCC-SLP [phone]  Myran Arcia 11/03/2018, 1:15 PM  [facility] [address] [place], [place], [address] Phone: [phone]   Fax:  [phone]   Name: DARQUAN HILES MRN: [id number] Date of Birth: 08-25-1951

## 2018-11-04 ENCOUNTER — Ambulatory Visit (INDEPENDENT_AMBULATORY_CARE_PROVIDER_SITE_OTHER): Payer: Medicare Other | Admitting: Otolaryngology

## 2018-11-04 DIAGNOSIS — H6981 Other specified disorders of Eustachian tube, right ear: Secondary | ICD-10-CM

## 2018-11-04 DIAGNOSIS — H9011 Conductive hearing loss, unilateral, right ear, with unrestricted hearing on the contralateral side: Secondary | ICD-10-CM | POA: Diagnosis not present

## 2018-11-04 DIAGNOSIS — Z85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, oral cavity, and pharynx: Secondary | ICD-10-CM | POA: Diagnosis not present

## 2018-11-09 ENCOUNTER — Encounter (HOSPITAL_COMMUNITY): Payer: Self-pay | Admitting: Dietician

## 2018-11-09 NOTE — Progress Notes (Signed)
Nutrition Follow-up 67 y/o male PMHx HLD, HOH, CKD, w/ little recent medical follow up. Presented to [facility] 6/12 after referred by local ENT MD for large L tonsillar mass s/p biopsy. + for SCC.  Begun radiation 7/22 and chemo 7/23. S/P peg placement 8/14 and start of Tube feeding 8/16. Finished radiation 9/12 and chemo 9/25.   RD following up remotely regarding pts PEG feeding s/p chemoradiation. Apparently pt had called RD last week when RD was out of office, reason he called was never established.   SLP had voiced to RD that pt is showing progress with swallowing and she anticipates performing another MBS in a week or so.   When last spoke to pt, his TF regimen was as follows:  4 cans Osmolite 1.5/d and 2 cans Ensure/d. He is infusing ~"2 bottles of water (32oz)".  Provides: 2122 kcals, 100 g Pro and 1084 mls free water (+960 from water flushes)   Today pt states  he had reached out to RD 11/7  because he was going through "a mental break". He says he had a couple days where he felt very poor. He had a severe stomach ache & nausea. He thinks he was dehydrated. He says he essentially did not administer any of his TF these 1-2 days because he did not feel well enough to.   After taking a couple days off, he began to feel better. He says he is now back to infusing 4-6 cans of Osmolite 1.5 per/day. He mixes and matches Osmolite w/ Ensure and will swap out cans of Osmolite for cans of Ensure  depending on which he feels he will do better with (these are quite similar in the kcals/protein they provide).   He is vague about his free water intake. He says he administers 1 bottle of water each time he does an Osmolite feeding "so it can be 1...2...or 3 bottles per day". Again he says his urine is the color of osmolite and RD again stresses it should be lighter. He does believe it is lighter than it was a week or so ago.   He weighs himself when RD is the phone as 149.6 lbs. When considering he weighs himself  in the office with his shoes and coat on, he is likely about the same weight as he was when last weighed (152 lbs).   He talks a lot about his swallow function and lack thereof  Wt Readings from Last 10 Encounters:  10/26/18 152 lb (68.9 kg)  10/19/18 152 lb (68.9 kg)  10/15/18 150 lb 9 oz (68.3 kg)  09/27/18 151 lb (68.5 kg)  09/17/18 157 lb (71.2 kg)  09/16/18 156 lb 12.8 oz (71.1 kg)  09/15/18 157 lb 1.6 oz (71.3 kg)  09/14/18 155 lb (70.3 kg)  09/13/18 154 lb 12.8 oz (70.2 kg)  09/10/18 158 lb 6.4 oz (71.8 kg)   MEDICATIONS: Chemo: COMPLETED Other Meds: Liquid morphine, Percocet, Carafate, Compazine  LABS:  None since last seen by RD  ANTHROPOMETRICS: Height:  Ht Readings from Last 1 Encounters:  08/24/18 '5\' 8"'  (1.727 m)   Weight:  Wt Readings from Last 1 Encounters:  10/26/18 152 lb (68.9 kg)   BMI:  BMI Readings from Last 1 Encounters:  10/26/18 23.11 kg/m   UBW: 182 when presenting In June IBW: 70 kg Wt change: Stable x1 month. - 30 lbs since June (16% in 5 months)  Re-Estimated needs:  Energy: 1950-2150 kcals (28-31 kcal/kg bw) Protein:83-96g Pro (1.2-1.4 g/kg bw)  Fluid: >2.1  L fluid (30 ml/kcal)   NUTRITION DIAGNOSIS:  Inability to eat related to side effects of chemoradiation as patient report of minimal PO intake and need for PEG   DOCUMENTATION CODES:  Not applicable at this time.   INTERVENTION:   Estimate pt is receiving 2000-2100 kcals and 90-100g of Pro each day. Unable to estimate free water based on his report.   The wt he reports on the phone seems to be in line with his recent office weights (once adjusted for shoes and coat)  He sounds to be making progress in regards to swallow function. He says he can now swallow his own saliva and "bits of water". He has become more accepting of the time it will take to eat again as he has done some online reading of literature/research articles regarding H&N cancer and the effects of radiation. He now  understands he will likely be dealing with the ramifications of radiotherapy for the rest of his life and that he "screwed up" by not doing some of the preventative exercises during tx.   RD provided much supportive listening.   Pt largely does what he wants with his tube feeding and adjusts it based off of how he is feeling. Using his reported AVERAGE intakes of osmolite and Ensure, he is meeting needs.  At this time, we are unable to begin weaning TF. Will continue to wait on return of swallow function per SLPs recommendations. Per talking with her, she felt he would be ready for repeat MBS in week or so.   GOAL:  Patient will meet greater than or equal to 90% of their needs  MET  MONITOR:  Labs, Weight trends, TF tolerance, I & O's, PO intake, swallow function  Next Visit:  Remote x 2 weeks.   [name] RD, LDN, CNSC Clinical Nutrition Available Tues-Sat via Pager: [phone] 11/09/2018 9:10 AM

## 2018-11-23 ENCOUNTER — Encounter (HOSPITAL_COMMUNITY): Payer: Self-pay | Admitting: Dietician

## 2018-11-23 NOTE — Progress Notes (Signed)
Nutrition Follow-up 67 y/o male PMHx HLD, HOH, CKD, w/ little recent medical follow up. Presented to [facility] 6/12 after referred by local ENT MD for large L tonsillar mass s/p biopsy. + for SCC.  Begun radiation 7/22 and chemo 7/23. S/P peg 8/14. Finished radiation 9/12 and chemo 9/25.   RD following up remotely regarding pts PEG feeding s/p chemoradiation. When last spoken to, pt was still unable to consume anything by mouth, though it did sound as though he was making progress in regards to regaining ability to swallow.   He has historically managed his PEG feedings as he sees fit. He mixes and matches Osmolite 1.5 with Ensure, basing it off of which one he feels he will do better with. When last spoke, he said his intake of Ensure and Osmolite typically equated to 6 cans/bottles per day. Based on last report, intake was estimated to be 2000-2100 kcals and 90-100g of Pro each day.  There is no new weight or lab data since last RD encounter.  _____________________________  Upon calling today, got answering machinel.   RD left message, See below for details of VM.   Wt Readings from Last 10 Encounters:  10/26/18 152 lb (68.9 kg)  10/19/18 152 lb (68.9 kg)  10/15/18 150 lb 9 oz (68.3 kg)  09/27/18 151 lb (68.5 kg)  09/17/18 157 lb (71.2 kg)  09/16/18 156 lb 12.8 oz (71.1 kg)  09/15/18 157 lb 1.6 oz (71.3 kg)  09/14/18 155 lb (70.3 kg)  09/13/18 154 lb 12.8 oz (70.2 kg)  09/10/18 158 lb 6.4 oz (71.8 kg)   MEDICATIONS: Chemo: COMPLETED Other Meds: Liquid morphine, Percocet, Carafate, Compazine  LABS:  None since last seen by RD  ANTHROPOMETRICS: Height:  Ht Readings from Last 1 Encounters:  08/24/18 '5\' 8"'  (1.727 m)   Weight:  Wt Readings from Last 1 Encounters:  10/26/18 152 lb (68.9 kg)   BMI:  BMI Readings from Last 1 Encounters:  10/26/18 23.11 kg/m   UBW: 182 when presenting In June IBW: 70 kg Wt changes as of 11/5: Stable x1 month. - 30 lbs since June (16% in 5  months)  Re-Estimated needs:  Energy: 1950-2150 kcals (28-31 kcal/kg bw) Protein:83-96g Pro (1.2-1.4 g/kg bw) Fluid: >2.1  L fluid (30 ml/kcal)   NUTRITION DIAGNOSIS:  Inability to eat related to side effects of chemoradiation as patient report of minimal PO intake and need for PEG   DOCUMENTATION CODES:  Not applicable at this time.   INTERVENTION:    RD left message stating he was calling just to check in, as it has been two weeks since last spoke. RD wanted to see there have been any changes in his swallow function, TF intake or weight. RD reminded pt that he has an office visit in 2 weeks and RD will see him in person at that time. RD noted to pt that he need not call back If he is stable. RD left contact number in event he has any acute concerns.   GOAL:  Patient will meet greater than or equal to 90% of their needs  Unknown if met, couldn't reach  MONITOR:  Labs, Weight trends, TF tolerance, I & O's, PO intake, swallow function  Next Visit:  In office x 2 weeks.   [name] RD, LDN, CNSC Clinical Nutrition Available Tues-Sat via Pager: [phone] 11/23/2018 10:40 AM

## 2018-12-01 ENCOUNTER — Inpatient Hospital Stay (HOSPITAL_COMMUNITY): Payer: Medicare Other | Attending: Hematology

## 2018-12-01 ENCOUNTER — Other Ambulatory Visit: Payer: Self-pay

## 2018-12-01 ENCOUNTER — Encounter (HOSPITAL_COMMUNITY): Payer: Self-pay

## 2018-12-01 DIAGNOSIS — Z87891 Personal history of nicotine dependence: Secondary | ICD-10-CM | POA: Insufficient documentation

## 2018-12-01 DIAGNOSIS — C099 Malignant neoplasm of tonsil, unspecified: Secondary | ICD-10-CM | POA: Diagnosis present

## 2018-12-01 DIAGNOSIS — Z923 Personal history of irradiation: Secondary | ICD-10-CM | POA: Diagnosis not present

## 2018-12-01 DIAGNOSIS — Z79899 Other long term (current) drug therapy: Secondary | ICD-10-CM | POA: Insufficient documentation

## 2018-12-01 DIAGNOSIS — Z9221 Personal history of antineoplastic chemotherapy: Secondary | ICD-10-CM | POA: Diagnosis not present

## 2018-12-01 LAB — CBC WITH DIFFERENTIAL/PLATELET
Abs Immature Granulocytes: 0 10*3/uL (ref 0.00–0.07)
Basophils Absolute: 0 10*3/uL (ref 0.0–0.1)
Basophils Relative: 0 %
Eosinophils Absolute: 0.1 10*3/uL (ref 0.0–0.5)
Eosinophils Relative: 3 %
HEMATOCRIT: 37.7 % — AB (ref 39.0–52.0)
Hemoglobin: 12.1 g/dL — ABNORMAL LOW (ref 13.0–17.0)
Immature Granulocytes: 0 %
LYMPHS PCT: 13 %
Lymphs Abs: 0.4 10*3/uL — ABNORMAL LOW (ref 0.7–4.0)
MCH: 30.9 pg (ref 26.0–34.0)
MCHC: 32.1 g/dL (ref 30.0–36.0)
MCV: 96.2 fL (ref 80.0–100.0)
Monocytes Absolute: 0.3 10*3/uL (ref 0.1–1.0)
Monocytes Relative: 10 %
NRBC: 0 % (ref 0.0–0.2)
Neutro Abs: 2.4 10*3/uL (ref 1.7–7.7)
Neutrophils Relative %: 74 %
Platelets: 144 10*3/uL — ABNORMAL LOW (ref 150–400)
RBC: 3.92 MIL/uL — ABNORMAL LOW (ref 4.22–5.81)
RDW: 11.9 % (ref 11.5–15.5)
WBC: 3.3 10*3/uL — ABNORMAL LOW (ref 4.0–10.5)

## 2018-12-01 LAB — COMPREHENSIVE METABOLIC PANEL
ALK PHOS: 63 U/L (ref 38–126)
ALT: 16 U/L (ref 0–44)
AST: 19 U/L (ref 15–41)
Albumin: 3.9 g/dL (ref 3.5–5.0)
Anion gap: 6 (ref 5–15)
BUN: 14 mg/dL (ref 8–23)
CO2: 28 mmol/L (ref 22–32)
Calcium: 9.2 mg/dL (ref 8.9–10.3)
Chloride: 101 mmol/L (ref 98–111)
Creatinine, Ser: 0.84 mg/dL (ref 0.61–1.24)
GFR calc non Af Amer: >60 mL/min (ref >60)
Glucose, Bld: 145 mg/dL — ABNORMAL HIGH (ref 70–99)
Potassium: 3.5 mmol/L (ref 3.5–5.1)
Sodium: 135 mmol/L (ref 135–145)
Total Bilirubin: 0.9 mg/dL (ref 0.3–1.2)
Total Protein: 6.9 g/dL (ref 6.5–8.1)

## 2018-12-01 LAB — TSH: TSH: 4.156 u[IU]/mL (ref 0.350–4.500)

## 2018-12-01 LAB — PHOSPHORUS: Phosphorus: 3.4 mg/dL (ref 2.5–4.6)

## 2018-12-01 LAB — MAGNESIUM: Magnesium: 2.2 mg/dL (ref 1.7–2.4)

## 2018-12-01 MED ORDER — SODIUM CHLORIDE 0.9% FLUSH: 10.0000 mL | Freq: Once | INTRAVENOUS | Status: DC

## 2018-12-01 MED ORDER — HEPARIN SOD (PORK) LOCK FLUSH 100 UNIT/ML IV SOLN: 500.0000 [IU] | Freq: Once | INTRAVENOUS | Status: DC

## 2018-12-01 NOTE — Progress Notes (Signed)
Pt here for port flush and lab draw. No complaints of any pain and denies any changes since last visit.

## 2018-12-01 NOTE — Patient Instructions (Signed)
[place] at [facility] Discharge Instructions  Port flush with labs done today.   Thank you for choosing [place] at [facility] to provide your oncology and hematology care.  To afford each patient quality time with our provider, please arrive at least 15 minutes before your scheduled appointment time.   If you have a lab appointment with the [facility] please come in thru the  Main Entrance and check in at the main information desk  You need to re-schedule your appointment should you arrive 10 or more minutes late.  We strive to give you quality time with our providers, and arriving late affects you and other patients whose appointments are after yours.  Also, if you no show three or more times for appointments you may be dismissed from the clinic at the providers discretion.     Again, thank you for choosing [facility].  Our hope is that these requests will decrease the amount of time that you wait before being seen by our physicians.       _____________________________________________________________  Should you have questions after your visit to [facility], please contact our office at [phone] between the hours of 8:00 a.m. and 4:30 p.m.  Voicemails left after 4:00 p.m. will not be returned until the following business day.  For prescription refill requests, have your pharmacy contact our office and allow 72 hours.    Cancer Center Support Programs:   > Cancer Support Group  2nd Tuesday of the month 1pm-2pm, Journey Room

## 2018-12-02 ENCOUNTER — Other Ambulatory Visit (HOSPITAL_COMMUNITY): Payer: Self-pay | Admitting: Specialist

## 2018-12-02 DIAGNOSIS — R1319 Other dysphagia: Secondary | ICD-10-CM

## 2018-12-07 ENCOUNTER — Inpatient Hospital Stay (HOSPITAL_COMMUNITY): Payer: Medicare Other | Admitting: Dietician

## 2018-12-07 ENCOUNTER — Other Ambulatory Visit: Payer: Self-pay

## 2018-12-07 ENCOUNTER — Encounter (HOSPITAL_COMMUNITY): Payer: Self-pay | Admitting: Hematology

## 2018-12-07 ENCOUNTER — Encounter (HOSPITAL_COMMUNITY): Payer: Self-pay | Admitting: Speech Pathology

## 2018-12-07 ENCOUNTER — Inpatient Hospital Stay (HOSPITAL_BASED_OUTPATIENT_CLINIC_OR_DEPARTMENT_OTHER): Payer: Medicare Other | Admitting: Hematology

## 2018-12-07 ENCOUNTER — Ambulatory Visit (HOSPITAL_COMMUNITY)
Admission: RE | Admit: 2018-12-07 | Discharge: 2018-12-07 | Disposition: A | Discharge status: Home / Self Care | Payer: Medicare Other | Source: Ambulatory Visit | Attending: Hematology | Admitting: Hematology

## 2018-12-07 ENCOUNTER — Ambulatory Visit (HOSPITAL_COMMUNITY): Payer: Medicare Other | Attending: Hematology | Admitting: Speech Pathology

## 2018-12-07 VITALS — BP 112/52 | HR 78 | Temp 98.5°F | Resp 18 | Wt 149.7 lb

## 2018-12-07 DIAGNOSIS — Z87891 Personal history of nicotine dependence: Secondary | ICD-10-CM

## 2018-12-07 DIAGNOSIS — B3781 Candidal esophagitis: Principal | ICD-10-CM

## 2018-12-07 DIAGNOSIS — R1312 Dysphagia, oropharyngeal phase: Secondary | ICD-10-CM

## 2018-12-07 DIAGNOSIS — Z923 Personal history of irradiation: Secondary | ICD-10-CM

## 2018-12-07 DIAGNOSIS — C099 Malignant neoplasm of tonsil, unspecified: Secondary | ICD-10-CM | POA: Diagnosis not present

## 2018-12-07 DIAGNOSIS — B370 Candidal stomatitis: Secondary | ICD-10-CM

## 2018-12-07 DIAGNOSIS — Z9221 Personal history of antineoplastic chemotherapy: Secondary | ICD-10-CM | POA: Diagnosis not present

## 2018-12-07 DIAGNOSIS — R1319 Other dysphagia: Secondary | ICD-10-CM | POA: Diagnosis not present

## 2018-12-07 DIAGNOSIS — Z79899 Other long term (current) drug therapy: Secondary | ICD-10-CM

## 2018-12-07 MED ORDER — FLUCONAZOLE 100 MG PO TABS: 100.0000 mg | ORAL_TABLET | Freq: Every day | ORAL | 0 refills | Status: DC

## 2018-12-07 NOTE — Patient Instructions (Signed)
[place] at [facility] Discharge Instructions  Follow up in 6 weeks with scans and labs    Thank you for choosing [place] at [facility] to provide your oncology and hematology care.  To afford each patient quality time with our provider, please arrive at least 15 minutes before your scheduled appointment time.   If you have a lab appointment with the [facility] please come in thru the  Main Entrance and check in at the main information desk  You need to re-schedule your appointment should you arrive 10 or more minutes late.  We strive to give you quality time with our providers, and arriving late affects you and other patients whose appointments are after yours.  Also, if you no show three or more times for appointments you may be dismissed from the clinic at the providers discretion.     Again, thank you for choosing [facility].  Our hope is that these requests will decrease the amount of time that you wait before being seen by our physicians.       _____________________________________________________________  Should you have questions after your visit to [facility], please contact our office at [phone] between the hours of 8:00 a.m. and 4:30 p.m.  Voicemails left after 4:00 p.m. will not be returned until the following business day.  For prescription refill requests, have your pharmacy contact our office and allow 72 hours.    Cancer Center Support Programs:   > Cancer Support Group  2nd Tuesday of the month 1pm-2pm, Journey Room

## 2018-12-07 NOTE — Progress Notes (Signed)
Nutrition Follow-up 67 y/o male PMHx HLD, HOH, CKD, w/ little recent medical follow up. Presented to [facility] 6/12 after referred by local ENT MD for large L tonsillar mass s/p biopsy. + for SCC.  Begun radiation 7/22 and chemo 7/23. S/P peg placement 8/14 and start of Tube feeding 8/16. Finished radiation 9/12 and chemo 9/25.   Pt seen at office visit today. He is s/p chemoradiation, but attempts at TF weaning have not yet begun because he has not yet been deemed safe for PO intake by ST. He has been continuing to work with [facility] and has a repeat MBSS scheduled for today.   Today, pt says he is doing the same thing in regards to his PEG feedings. He is still infusing 4-6 cans of either Osmolite 1.5 or Ensure each day. He mixes and matches Osmolite 1.5 with Ensure, basing it off of which one he feels he will do better with. Similarly, the amount he infuses "depends on how I am feeling". He says he more often infuses 4 cans than 6 cans.  4 cans of Osmolite 1.5 with his two bottles of water (32 oz) provides: 1420 kcals, 60 g Pro and 724 mls free water (+960 from water flushes)   Denies N/V/C/D  He says he is no longer making progress with his swallow function, which he believes is secondary to thick secretions. He reports doing his ST exercises, but "not as often as you all would like me to". He has not shown up to several different provider appts because of cost.   Wt today is 149.7 lbs.  This is slightly under the weight range of 150-152 lbs that he had maintained for the past 2.5 months  Wt Readings from Last 10 Encounters:  12/07/18 149 lb 11.2 oz (67.9 kg)  10/26/18 152 lb (68.9 kg)  10/19/18 152 lb (68.9 kg)  10/15/18 150 lb 9 oz (68.3 kg)  09/27/18 151 lb (68.5 kg)  09/17/18 157 lb (71.2 kg)  09/16/18 156 lb 12.8 oz (71.1 kg)  09/15/18 157 lb 1.6 oz (71.3 kg)  09/14/18 155 lb (70.3 kg)  09/13/18 154 lb 12.8 oz (70.2 kg)   MEDICATIONS: Chemo: COMPLETED Other Meds: Liquid morphine, Percocet,  Carafate, Compazine, Diflucan   LABS:  CMP on 12/11 unremarkable. Phos WDL.   Recent Labs  Lab 12/01/18 1130  NA 135  K 3.5  CL 101  CO2 28  BUN 14  CREATININE 0.84  CALCIUM 9.2  MG 2.2  PHOS 3.4  GLUCOSE 145*   ANTHROPOMETRICS: Height:  Ht Readings from Last 1 Encounters:  08/24/18 _0  (1.727 m)   Weight:  Wt Readings from Last 1 Encounters:  12/07/18 149 lb 11.2 oz (67.9 kg)   BMI:  BMI Readings from Last 1 Encounters:  12/07/18 22.76 kg/m   UBW: 182 when presenting In June IBW: 70 kg Wt change: Stable x1 month. - 32 lbs since June (17.7% in 6 months)  Re-Estimated needs:  Energy: 1750-1950 kcals (26-29 kcal/kg bw) Protein:81-95g Pro (1.2-1.4 g/kg bw) Fluid: >2  L fluid (30 ml/kcal)   NUTRITION DIAGNOSIS:  Inability to eat related to dysphagia as evidenced by recommendation for NPO status by ST    DOCUMENTATION CODES:  Not applicable at this time.   INTERVENTION:   Pt says he more often than not only infuses 4 cans of Osmolite each day. This equates to 1420 kcals, 60 g Pro and 724 mls free water (+960 from water flushes). This is not felt  to be sufficient. His weight is slightly down.   He is obviously not infusing enough tube feeding. Unfortunately, pt historically managing his PEG feedings as he sees fit and is not amenable to RD direction. He mixes and matches Osmolite 1.5 with Ensure, basing it off of which one he feels he will do better with.   Today he is somewhat agitated and  difficult to speak with or educate. He is almost deaf and largely spends the conversation laying out his various complaints, often cutting RD off. Was unsuccessful in directing patient to discussions about his tube feeding regimen or inadequacies thereof.  He asks for another case of Ensure. This is provided to him today.   Will monitor outcome of MBSS today. Regardless of whether or not he is cleared for intake, he should not wean his TF further as he does not sound be  meeting his needs right now anyway.   MD starting Diflucan today in the event pt has oral/esophageal candidiasis.   GOAL:  Patient will meet greater than or equal to 90% of their needs  NOT MET  MONITOR:  Labs, Weight trends, TF tolerance, I & O's, PO intake, swallow function  Next Visit:  To be determined   [name] RD, LDN, CNSC Clinical Nutrition Available Tues-Sat via Pager: [phone] 12/07/2018 1:39 PM

## 2018-12-07 NOTE — Progress Notes (Signed)
[address] [place], [place] [address]   CLINIC:  Medical Oncology/Hematology  PCP:  [name], DO [place] [id number] [phone]   REASON FOR VISIT: Follow-up for tonsillar cancer  CURRENT THERAPY: Observation per NCCN guidelines.  BRIEF ONCOLOGIC HISTORY:    Tonsillar cancer ([place])   06/02/2018 Initial Diagnosis    Tonsillar cancer ([place])    06/15/2018 -  Chemotherapy    The patient had palonosetron (ALOXI) injection 0.25 mg, 0.25 mg, Intravenous,  Once, 3 of 3 cycles Administration: 0.25 mg (07/13/2018), 0.25 mg (07/15/2018), 0.25 mg (08/10/2018), 0.25 mg (09/13/2018) CARBOplatin (PARAPLATIN) 140 mg in sodium chloride 0.9 % 100 mL chemo infusion, 70 mg/m2 = 140 mg (100 % of original dose 70 mg/m2), Intravenous,  Once, 3 of 3 cycles Dose modification: 70 mg/m2 (original dose 70 mg/m2, Cycle 1, Reason: Other (see comments), Comment: french protocol head and neck), 52.5 mg/m2 (75 % of original dose 70 mg/m2, Cycle 3, Reason: Provider Judgment) Administration: 140 mg (07/13/2018), 140 mg (07/14/2018), 140 mg (07/15/2018), 140 mg (07/19/2018), 140 mg (08/10/2018), 140 mg (08/11/2018), 140 mg (08/12/2018), 140 mg (08/13/2018), 140 mg (09/13/2018), 140 mg (09/14/2018), 100 mg (09/15/2018), 100 mg (09/16/2018) fluorouracil (ADRUCIL) 4,750 mg in sodium chloride 0.9 % 55 mL chemo infusion, 600 mg/m2/day = 4,750 mg (100 % of original dose 600 mg/m2/day), Intravenous, 4D (96 hours ), 3 of 3 cycles Dose modification: 600 mg/m2/day (original dose 600 mg/m2/day, Cycle 1, Reason: Other (see comments), Comment: french protocol head and neck) Administration: 4,750 mg (07/13/2018), 4,750 mg (08/10/2018), 4,750 mg (09/13/2018)  for chemotherapy treatment.       CANCER STAGING: Cancer Staging Tonsillar cancer ([place]) Staging form: Pharynx - HPV-Mediated Oropharynx, AJCC 8th Edition - Clinical stage from 06/02/2018: Stage II (cT3, cN2, cM0) -  Unsigned    INTERVAL HISTORY:  Mr. Bohlken 67 y.o. male returns for routine follow-up for tonsillar cancer. He is still having some problems swallowing. He has thrust on his tongue and throat. He has burning when he swallows. He has sores in his cheeks that are painful. He has another swallow evaluation this week. He denies any new pains. Denies any nausea, vomiting, or diarrhea. Denies any fevers or recent infections. Denies any bleeding or easy bruising. He reports not having an appetite and his energy level is 50%. He lives alone and is doing well.     REVIEW OF SYSTEMS:  Review of Systems  Constitutional: Positive for fatigue.  HENT:   Positive for sore throat and trouble swallowing.   Neurological: Positive for numbness.  All other systems reviewed and are negative.    PAST MEDICAL/SURGICAL HISTORY:  Past Medical History:  Diagnosis Date  . HOH (hard of hearing)   . Hyperlipidemia   . Mass of oral cavity 05/12/2018  . Positive FIT (fecal immunochemical test) 05/12/2018  . PTSD (post-traumatic stress disorder)    has not been offically diagnosised  . Tonsillar cancer ([employer]) 06/02/2018   Past Surgical History:  Procedure Laterality Date  . APPENDECTOMY  1969  . COLONOSCOPY W/ POLYPECTOMY     remote past  . ESOPHAGOGASTRODUODENOSCOPY  08/04/2018   Dr. Arnoldo Morale: erythema of larynx, normal stomach, normal duodenum. PEG Placed 20 F, bumper at 2.5 cm  . ESOPHAGOGASTRODUODENOSCOPY (EGD) WITH PROPOFOL N/A 08/04/2018   Procedure: ESOPHAGOGASTRODUODENOSCOPY (EGD) WITH PROPOFOL;  Surgeon: Aviva Signs, MD;  [facility]: [facility];  Service: Gastroenterology;  Laterality: N/A;  . FINGER FRACTURE SURGERY Left   .  MULTIPLE EXTRACTIONS WITH ALVEOLOPLASTY N/A 06/15/2018   Procedure: Extraction of tooth #'s 2,12,13,18,20,and 21 with alveoloplasty and gross debridement of remaining teeth;  Surgeon: Lenn Cal, DDS;  Location: [facility];  Service: Oral Surgery;  Laterality: N/A;  . PEG  PLACEMENT N/A 08/04/2018   Procedure: PERCUTANEOUS ENDOSCOPIC GASTROSTOMY (PEG) PLACEMENT;  Surgeon: Aviva Signs, MD;  Location: [facility];  Service: Gastroenterology;  Laterality: N/A;  . PORTACATH PLACEMENT Left 07/02/2018   Procedure: INSERTION PORT-A-CATH;  Surgeon: Aviva Signs, MD;  Location: [facility];  Service: General;  Laterality: Left;     SOCIAL HISTORY:  Social History   Socioeconomic History  . Marital status: Divorced    Spouse name: Not on file  . Number of children: Not on file  . Years of education: Not on file  . Highest education level: Not on file  Occupational History  . Occupation: English as a second language teacher, retired  Scientific laboratory technician  . Financial resource strain: Not on file  . Food insecurity:    Worry: Not on file    Inability: Not on file  . Transportation needs:    Medical: Not on file    Non-medical: Not on file  Tobacco Use  . Smoking status: Former Smoker    Years: 15.00    Types: Cigarettes    Last attempt to quit: 2000    Years since quitting: 19.9  . Smokeless tobacco: Never Used  Substance and Sexual Activity  . Alcohol use: Yes    Alcohol/week: 12.0 standard drinks    Types: 12 Cans of beer per week  . Drug use: Never  . Sexual activity: Not Currently  Lifestyle  . Physical activity:    Days per week: Not on file    Minutes per session: Not on file  . Stress: Not on file  Relationships  . Social connections:    Talks on phone: Not on file    Gets together: Not on file    Attends religious service: Not on file    Active member of club or organization: Not on file    Attends meetings of clubs or organizations: Not on file    Relationship status: Not on file  . Intimate partner violence:    Fear of current or ex partner: Not on file    Emotionally abused: Not on file    Physically abused: Not on file    Forced sexual activity: Not on file  Other Topics Concern  . Not on file  Social History Narrative   Mr. Ballo is a pleasant gentleman who is a  retired English as a second language teacher.  He lives independently with his dog here in [place].  He formally lived in [place].    FAMILY HISTORY:  Family History  Problem Relation Age of Onset  . Diabetes Father        died at age 16   . Colon cancer Neg Hx     CURRENT MEDICATIONS:  Outpatient Encounter Medications as of 12/07/2018  Medication Sig  . Amino Acids-Protein Hydrolys (FEEDING SUPPLEMENT, PRO-STAT SUGAR FREE 64,) LIQD Place 30 mLs into feeding tube daily.  . fluconazole (DIFLUCAN) 100 MG tablet Take 1 tablet (100 mg total) by mouth daily.  Marland Kitchen ibuprofen (ADVIL,MOTRIN) 200 MG tablet Take 400 mg by mouth every 6 (six) hours as needed for moderate pain.   Marland Kitchen lidocaine (XYLOCAINE) 2 % solution Viscous lidocaine 2%/ Maalox 1:1 mixture. Swish and swallow 1 tablespoon four times a day100 (Patient taking differently: Use as directed 15 mLs in  the mouth or throat 4 (four) times daily. Viscous lidocaine 2%/ Maalox 1:1 mixture. Swish and swallow 1 tablespoon four times a day100)  . lidocaine-prilocaine (EMLA) cream Apply to affected area once  . Morphine Sulfate (MORPHINE CONCENTRATE) 10 mg / 0.5 ml concentrated solution Take 0.5 mLs (10 mg total) by mouth every 3 (three) hours as needed for severe pain.  . Nutritional Supplements (FEEDING SUPPLEMENT, OSMOLITE 1.5 CAL,) LIQD Place 237 mLs into feeding tube 2 (two) times daily.  Marland Kitchen oxyCODONE-acetaminophen (PERCOCET) 5-325 MG tablet Take one tablet by mouth every 4 hours as needed for pain.  Marland Kitchen prochlorperazine (COMPAZINE) 10 MG tablet Take 1 tablet (10 mg total) by mouth every 6 (six) hours as needed (Nausea or vomiting).  . sodium fluoride (FLUORISHIELD) 1.1 % GEL dental gel Instill one drop of gel per tooth space of fluoride tray. Place over teeth for 5 minutes. Remove. Spit out excess. Repeat nightly. (Patient taking differently: Place 1 drop onto teeth See admin instructions. Instill one drop of gel per tooth space of fluoride tray. Place over teeth for 5 minutes.  Remove. Spit out excess. Repeat nightly.)  . sucralfate (CARAFATE) 1 GM/10ML suspension Carafate 1gm/34ml & Viscous lidocaine 2% 1:1 mixture.  Swish and swallow 1 tablespoon four times a day. (Patient taking differently: Take by mouth 4 (four) times daily. Carafate 1gm/41ml & Viscous lidocaine 2% 1:1 mixture.  Swish and swallow 1 tablespoon four times a day.)   No facility-administered encounter medications on file as of 12/07/2018.     ALLERGIES:  No Known Allergies   PHYSICAL EXAM:  ECOG Performance status: 1  Vitals:   12/07/18 1250  BP: (!) 112/52  Pulse: 78  Resp: 18  Temp: 98.5 F (36.9 C)  SpO2: 100%   Filed Weights   12/07/18 1250  Weight: 149 lb 11.2 oz (67.9 kg)    Physical Exam Constitutional:      Appearance: Normal appearance. He is normal weight.  Musculoskeletal: Normal range of motion.  Skin:    General: Skin is warm and dry.  Neurological:     Mental Status: He is alert and oriented to person, place, and time. Mental status is at baseline.  Psychiatric:        Mood and Affect: Mood normal.        Behavior: Behavior normal.        Thought Content: Thought content normal.        Judgment: Judgment normal.      LABORATORY DATA:  I have reviewed the labs as listed.  CBC    Component Value Date/Time   WBC 3.3 (L) 12/01/2018 1130   RBC 3.92 (L) 12/01/2018 1130   HGB 12.1 (L) 12/01/2018 1130   HGB 14.2 05/12/2018 0949   HCT 37.7 (L) 12/01/2018 1130   HCT 42.0 05/12/2018 0949   PLT 144 (L) 12/01/2018 1130   PLT 256 05/12/2018 0949   MCV 96.2 12/01/2018 1130   MCV 87 05/12/2018 0949   MCH 30.9 12/01/2018 1130   MCHC 32.1 12/01/2018 1130   RDW 11.9 12/01/2018 1130   RDW 13.0 05/12/2018 0949   LYMPHSABS 0.4 (L) 12/01/2018 1130   LYMPHSABS 1.1 05/12/2018 0949   MONOABS 0.3 12/01/2018 1130   EOSABS 0.1 12/01/2018 1130   EOSABS 0.1 05/12/2018 0949   BASOSABS 0.0 12/01/2018 1130   BASOSABS 0.0 05/12/2018 0949   CMP Latest Ref Rng & Units  12/01/2018 10/15/2018 09/27/2018  Glucose 70 - 99 mg/dL 145(H) 107(H) 106(H)  BUN 8 -  23 mg/dL 14 11 9   Creatinine 0.61 - 1.24 mg/dL 0.84 0.74 0.71  Sodium 135 - 145 mmol/L 135 140 138  Potassium 3.5 - 5.1 mmol/L 3.5 4.5 3.6  Chloride 98 - 111 mmol/L 101 103 99  CO2 22 - 32 mmol/L 28 30 32  Calcium 8.9 - 10.3 mg/dL 9.2 9.4 9.3  Total Protein 6.5 - 8.1 g/dL 6.9 6.9 6.8  Total Bilirubin 0.3 - 1.2 mg/dL 0.9 0.9 1.2  Alkaline Phos 38 - 126 U/L 63 55 52  AST 15 - 41 U/L 19 16 18   ALT 0 - 44 U/L 16 12 16        DIAGNOSTIC IMAGING:  I have independently reviewed the scans and discussed with the patient.   I have reviewed Francene Finders, NP's note and agree with the documentation.  I personally performed a face-to-face visit, made revisions and my assessment and plan is as follows.    ASSESSMENT & PLAN:   Tonsillar cancer ([place]) 1.  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 Benjamine Mola on 05/13/2018, status post biopsy consistent with squamous cell carcinoma, P 16+ - Baseline hearing loss and chronic kidney disease with creatinine of 1.34. - Pretreatment oropharyngeal examination shows a necrotic left tonsillar mass, not clearly visualized secondary to increased gag reflex.  There is a left lower neck lymph node palpable.  PET CT scan showed level 2, 3, 5 metastatic nodes on the left neck and mildly metabolic right level 2 lymph node along with the left tonsillar primary.   - He received 3 cycles of carboplatin and 5-FU and radiation therapy from 07/13/2018 through 09/13/2018. - He was hospitalized after cycle 2 with neutropenic fever. - CT of the neck on 10/15/2018 showed postradiation changes in the neck with increased supraglottic edema with no new or progressive disease.   -Physical examination today did not show any lymphadenopathy in the neck.  Thrush on the tongue was seen.   There is erythema on bilateral buccal mucosa.  Patient is able to swallow his sputum.  He is not able to swallow any foods.  He is having barium swallow later today.  He is continuing tube feeds. - I will give him a trial of Diflucan for 7 days.  I plan to repeat CT scan in 6 weeks.  I will also check a TSH level.  2.  Nutrition: -He is taking 2 cans of Osmolite 3 times a day via PEG tube. - He is doing swallow therapy.  He is still unable to swallow any liquids.  I have reviewed his modified barium swallow.      Orders placed this encounter:  Orders Placed This Encounter  Procedures  . CT SOFT TISSUE NECK W CONTRAST  . TSH  . CBC with Differential/Platelet  . Comprehensive metabolic panel      Derek Jack, MD [place] [phone]

## 2018-12-07 NOTE — Therapy (Signed)
12/07/18 1520      Prognosis   Prognosis for Safe Diet Advancement  Guarded    Barriers to Reach Goals  Severity of deficits      Individuals Consulted   Consulted and Agree with Results and Recommendations  Patient;Dietician    Report Sent to   Referring physician       Problem List Patient Active Problem List   Diagnosis Date Noted  . Protein-calorie malnutrition, severe 08/25/2018  . Neutropenic fever ([place]) 08/24/2018  . Hyperlipidemia 08/24/2018  . Pancytopenia ([place]) 08/24/2018  . Hyponatremia 08/24/2018  . Hyperbilirubinemia 08/24/2018  . Problems with swallowing and mastication   . Cellulitis and perichondritis of larynx   . Chronic periodontitis 06/10/2018  . Partial loss of teeth, unspecified edentulism 06/10/2018  . Tonsillar cancer ([place]) 06/02/2018  . Mass of oral cavity 05/12/2018  . Positive FIT (fecal immunochemical test) 05/12/2018   Thank you,  Genene Churn, [place]  [facility] 12/07/2018, 3:26 PM  [place] [address] [place], [place], [address] Phone: [phone]   Fax:  [phone]  Name: [name] MRN: [id number] Date of Birth: [birth year]  12/07/18 1520      Prognosis   Prognosis for Safe Diet Advancement  Guarded    Barriers to Reach Goals  Severity of deficits      Individuals Consulted   Consulted and Agree with Results and Recommendations  Patient;Dietician    Report Sent to   Referring physician       Problem List Patient Active Problem List   Diagnosis Date Noted  . Protein-calorie malnutrition, severe 08/25/2018  . Neutropenic fever ([place]) 08/24/2018  . Hyperlipidemia 08/24/2018  . Pancytopenia ([place]) 08/24/2018  . Hyponatremia 08/24/2018  . Hyperbilirubinemia 08/24/2018  . Problems with swallowing and mastication   . Cellulitis and perichondritis of larynx   . Chronic periodontitis 06/10/2018  . Partial loss of teeth, unspecified edentulism 06/10/2018  . Tonsillar cancer ([place]) 06/02/2018  . Mass of oral cavity 05/12/2018  . Positive FIT (fecal immunochemical test) 05/12/2018   Thank you,  Genene Churn, [place]  [facility] 12/07/2018, 3:26 PM  [place] [address] [place], [place], [address] Phone: [phone]   Fax:  [phone]  Name: [name] MRN: [id number] Date of Birth: [birth year]  [place], [place], [address] Phone: [phone]   Fax:  [phone]  Modified Barium Swallow  Patient Details  Name: [name] MRN: [id number] Date of Birth: October 26, 1951 No data recorded  Encounter Date: 12/07/2018  End of Session - 12/07/18 1520    Visit Number  5    Number of Visits  6    Date for SLP Re-Evaluation  12/14/18    Authorization Type  [employer] Medicare   $40 copay until deductible met and then covered at 100%   SLP Start Time  1335    SLP Stop Time   1405    SLP Time Calculation (min)  30 min    Activity Tolerance  Patient tolerated treatment well       Past Medical History:  Diagnosis Date  . HOH (hard of hearing)   . Hyperlipidemia   . Mass of oral cavity 05/12/2018  . Positive FIT (fecal immunochemical test) 05/12/2018  . PTSD (post-traumatic stress disorder)    has not been offically diagnosised  . Tonsillar cancer ([place]) 06/02/2018    Past Surgical History:  Procedure Laterality Date  . APPENDECTOMY  1969  . COLONOSCOPY W/ POLYPECTOMY     remote past  . ESOPHAGOGASTRODUODENOSCOPY  08/04/2018   Dr. Arnoldo Morale: erythema of larynx, normal stomach, normal duodenum. PEG Placed 20 F, bumper at 2.5 cm  . ESOPHAGOGASTRODUODENOSCOPY (EGD) WITH PROPOFOL N/A 08/04/2018   Procedure: ESOPHAGOGASTRODUODENOSCOPY (EGD) WITH PROPOFOL;  Surgeon: Aviva Signs, MD;  [facility];  Service: Gastroenterology;  Laterality: N/A;  . FINGER FRACTURE SURGERY Left   . MULTIPLE EXTRACTIONS WITH ALVEOLOPLASTY N/A 06/15/2018   Procedure: Extraction of tooth #'s 2,12,13,18,20,and 21 with alveoloplasty and gross debridement of remaining teeth;  Surgeon: Lenn Cal, DDS;  Location: [place];  Service: Oral Surgery;  Laterality: N/A;  . PEG PLACEMENT N/A 08/04/2018   Procedure: PERCUTANEOUS ENDOSCOPIC GASTROSTOMY (PEG) PLACEMENT;  Surgeon: Aviva Signs, MD;  Location[facility];  Service:  Gastroenterology;  Laterality: N/A;  . PORTACATH PLACEMENT Left 07/02/2018   Procedure: INSERTION PORT-A-CATH;  Surgeon: Aviva Signs, MD;  Location: [facility];  Service: General;  Laterality: Left;    There were no vitals filed for this visit.  Subjective Assessment - 12/07/18 1454    Subjective  "I think it is a little better."    Special Tests  MBSS    Currently in Pain?  No/denies          General - 12/07/18 1506      General Information   Date of Onset  05/12/18    HPI  [name] is a 67 yo male who was referred by Dr. 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  Radiation started 07/12/18 and went until 08/26/2018 for 5 days per week.  [name] lives alone in [place] with his dog. He received 3 cycles of carboplatin and 5-FU and radiation therapy from 07/13/2018 through 09/13/2018. He was hospitalized after cycle 2 with severe mucositis and neutropenic fever. [name] was seen for a clinical swallow evaluation in July, however he failed to return to the clinic for therapy due to cost restraints until the late fall. He had his last MBSS in October. [name] continues to complain of mouth pain.     Type of Study  MBS-Modified  12/07/18 1520      Prognosis   Prognosis for Safe Diet Advancement  Guarded    Barriers to Reach Goals  Severity of deficits      Individuals Consulted   Consulted and Agree with Results and Recommendations  Patient;Dietician    Report Sent to   Referring physician       Problem List Patient Active Problem List   Diagnosis Date Noted  . Protein-calorie malnutrition, severe 08/25/2018  . Neutropenic fever ([place]) 08/24/2018  . Hyperlipidemia 08/24/2018  . Pancytopenia ([place]) 08/24/2018  . Hyponatremia 08/24/2018  . Hyperbilirubinemia 08/24/2018  . Problems with swallowing and mastication   . Cellulitis and perichondritis of larynx   . Chronic periodontitis 06/10/2018  . Partial loss of teeth, unspecified edentulism 06/10/2018  . Tonsillar cancer ([place]) 06/02/2018  . Mass of oral cavity 05/12/2018  . Positive FIT (fecal immunochemical test) 05/12/2018   Thank you,  Genene Churn, [place]  [facility] 12/07/2018, 3:26 PM  [place] [address] [place], [place], [address] Phone: [phone]   Fax:  [phone]  Name: [name] MRN: [id number] Date of Birth: [birth year]

## 2018-12-07 NOTE — Assessment & Plan Note (Signed)
1.  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 Benjamine Mola on 05/13/2018, status post biopsy consistent with squamous cell carcinoma, P 16+ - Baseline hearing loss and chronic kidney disease with creatinine of 1.34. - Pretreatment oropharyngeal examination shows a necrotic left tonsillar mass, not clearly visualized secondary to increased gag reflex.  There is a left lower neck lymph node palpable.  PET CT scan showed level 2, 3, 5 metastatic nodes on the left neck and mildly metabolic right level 2 lymph node along with the left tonsillar primary.   - He received 3 cycles of carboplatin and 5-FU and radiation therapy from 07/13/2018 through 09/13/2018. - He was hospitalized after cycle 2 with neutropenic fever. - CT of the neck on 10/15/2018 showed postradiation changes in the neck with increased supraglottic edema with no new or progressive disease.   -Physical examination today did not show any lymphadenopathy in the neck.  Thrush on the tongue was seen.  There is erythema on bilateral buccal mucosa.  Patient is able to swallow his sputum.  He is not able to swallow any foods.  He is having barium swallow later today.  He is continuing tube feeds. - I will give him a trial of Diflucan for 7 days.  I plan to repeat CT scan in 6 weeks.  I will also check a TSH level.  2.  Nutrition: -He is taking 2 cans of Osmolite 3 times a day via PEG tube. - He is doing swallow therapy.  He is still unable to swallow any liquids.  I have reviewed his modified barium swallow.

## 2018-12-08 ENCOUNTER — Encounter (HOSPITAL_COMMUNITY): Payer: Self-pay | Admitting: Speech Pathology

## 2018-12-15 IMAGING — DX DG CHEST 2V
2 series · 2 of 2 positions shown · non-contrast
Comparison: 07/02/2018

CLINICAL DATA: Fever

EXAM:
CHEST - 2 VIEW

[chest lat]
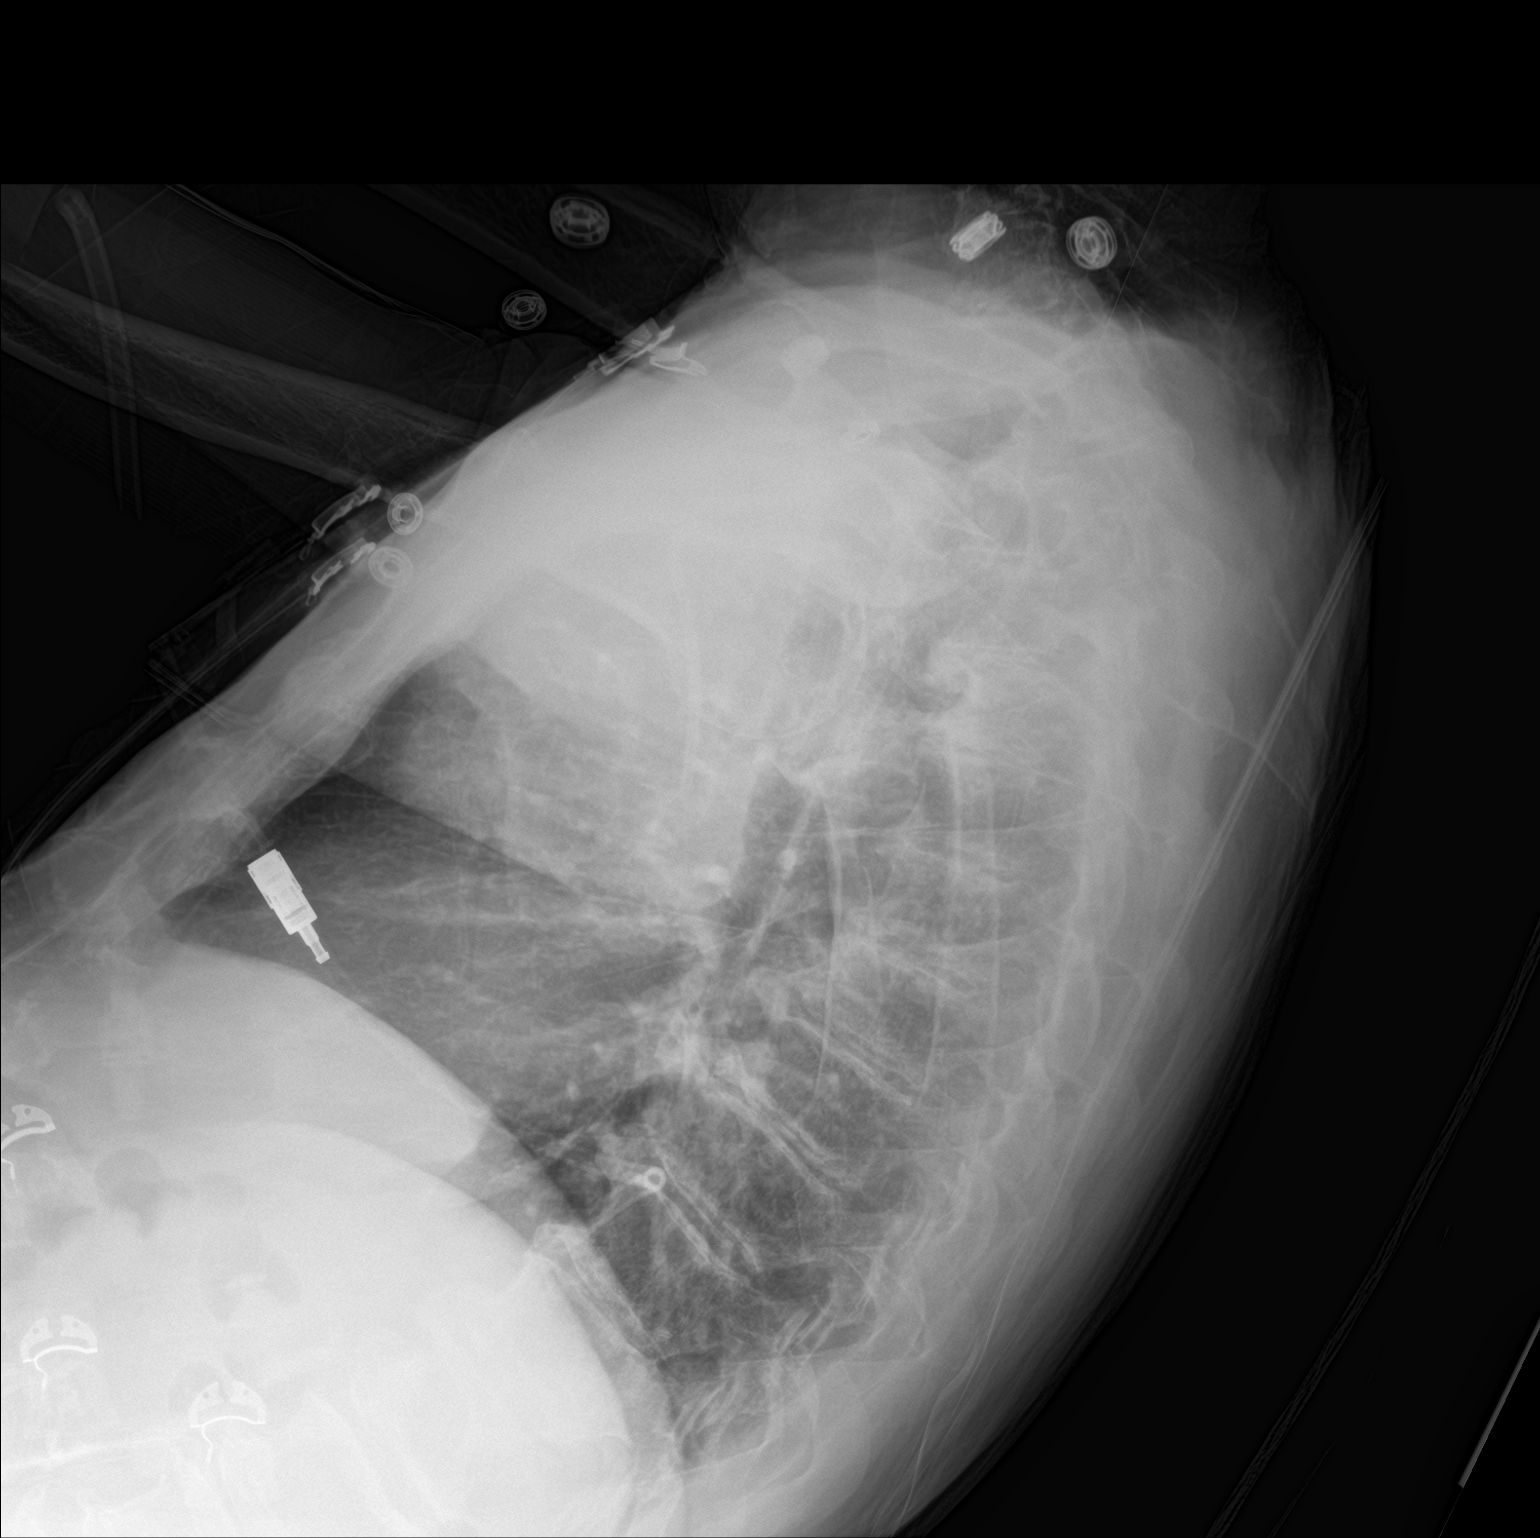

[chest ap]
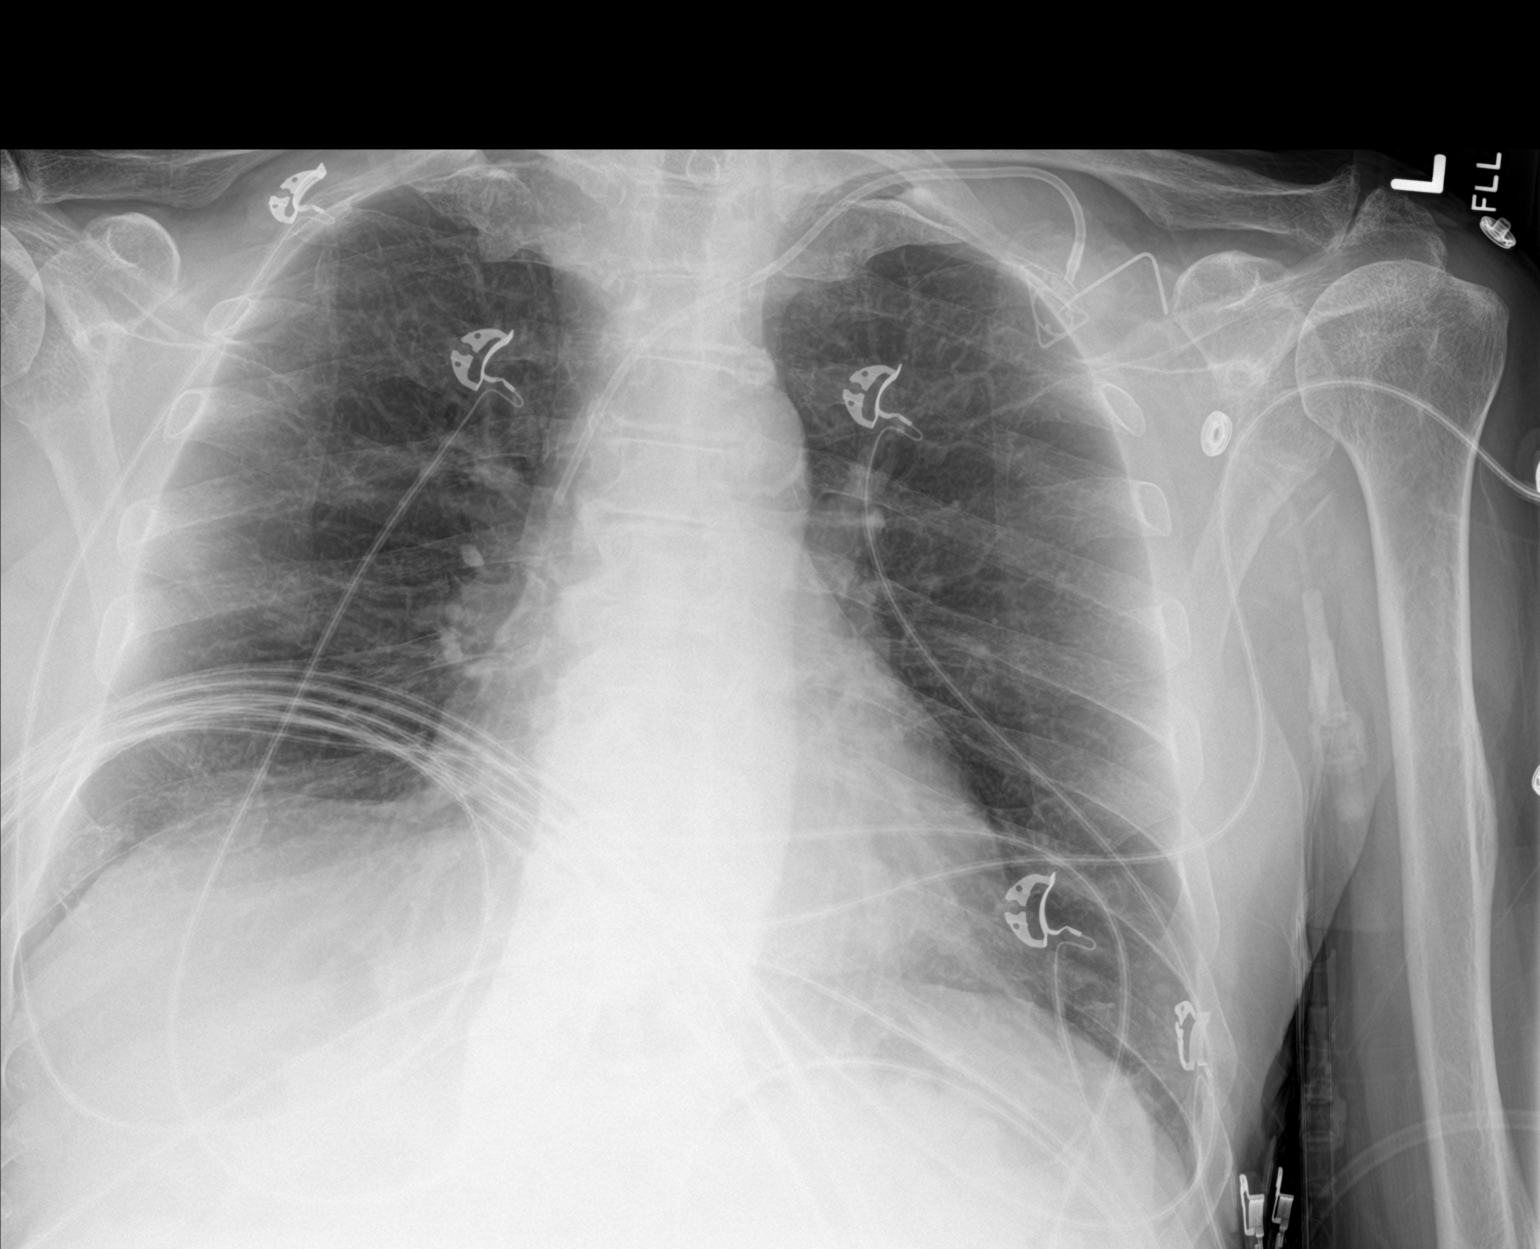

[2 of 2 positions shown; findings below may reference images not displayed]

FINDINGS: Left-sided central venous port tip over the SVC. No acute airspace
disease or effusion. Stable cardiomediastinal silhouette with aortic
atherosclerosis. No pneumothorax. Degenerative changes of the spine.
IMPRESSION: No active cardiopulmonary disease.

## 2019-01-06 ENCOUNTER — Other Ambulatory Visit (HOSPITAL_COMMUNITY): Payer: Self-pay | Admitting: Radiation Oncology

## 2019-01-06 ENCOUNTER — Other Ambulatory Visit: Payer: Self-pay | Admitting: Radiation Oncology

## 2019-01-06 DIAGNOSIS — C099 Malignant neoplasm of tonsil, unspecified: Secondary | ICD-10-CM

## 2019-01-11 ENCOUNTER — Inpatient Hospital Stay (HOSPITAL_COMMUNITY): Payer: Medicare Other | Attending: Hematology

## 2019-01-11 ENCOUNTER — Ambulatory Visit (HOSPITAL_COMMUNITY)
Admission: RE | Admit: 2019-01-11 | Discharge: 2019-01-11 | Disposition: A | Discharge status: Home / Self Care | Payer: Medicare Other | Source: Ambulatory Visit | Attending: Nurse Practitioner | Admitting: Nurse Practitioner

## 2019-01-11 DIAGNOSIS — C099 Malignant neoplasm of tonsil, unspecified: Secondary | ICD-10-CM | POA: Insufficient documentation

## 2019-01-11 LAB — CBC WITH DIFFERENTIAL/PLATELET
ABS IMMATURE GRANULOCYTES: 0 10*3/uL (ref 0.00–0.07)
Basophils Absolute: 0 10*3/uL (ref 0.0–0.1)
Basophils Relative: 1 %
Eosinophils Absolute: 0 10*3/uL (ref 0.0–0.5)
Eosinophils Relative: 1 %
HCT: 41.4 % (ref 39.0–52.0)
Hemoglobin: 13.2 g/dL (ref 13.0–17.0)
Immature Granulocytes: 0 %
LYMPHS ABS: 0.5 10*3/uL — AB (ref 0.7–4.0)
Lymphocytes Relative: 18 %
MCH: 30.1 pg (ref 26.0–34.0)
MCHC: 31.9 g/dL (ref 30.0–36.0)
MCV: 94.5 fL (ref 80.0–100.0)
MONOS PCT: 16 %
Monocytes Absolute: 0.4 10*3/uL (ref 0.1–1.0)
Neutro Abs: 1.7 10*3/uL (ref 1.7–7.7)
Neutrophils Relative %: 64 %
Platelets: 185 10*3/uL (ref 150–400)
RBC: 4.38 MIL/uL (ref 4.22–5.81)
RDW: 12.5 % (ref 11.5–15.5)
WBC: 2.7 10*3/uL — ABNORMAL LOW (ref 4.0–10.5)
nRBC: 0 % (ref 0.0–0.2)

## 2019-01-11 LAB — COMPREHENSIVE METABOLIC PANEL
ALT: 20 U/L (ref 0–44)
AST: 18 U/L (ref 15–41)
Albumin: 4 g/dL (ref 3.5–5.0)
Alkaline Phosphatase: 64 U/L (ref 38–126)
Anion gap: 10 (ref 5–15)
BILIRUBIN TOTAL: 0.5 mg/dL (ref 0.3–1.2)
BUN: 22 mg/dL (ref 8–23)
CO2: 31 mmol/L (ref 22–32)
Calcium: 9.4 mg/dL (ref 8.9–10.3)
Chloride: 97 mmol/L — ABNORMAL LOW (ref 98–111)
Creatinine, Ser: 0.8 mg/dL (ref 0.61–1.24)
GFR calc Af Amer: >60 mL/min (ref >60)
GFR calc non Af Amer: >60 mL/min (ref >60)
Glucose, Bld: 108 mg/dL — ABNORMAL HIGH (ref 70–99)
Potassium: 3.4 mmol/L — ABNORMAL LOW (ref 3.5–5.1)
Sodium: 138 mmol/L (ref 135–145)
TOTAL PROTEIN: 6.9 g/dL (ref 6.5–8.1)

## 2019-01-11 LAB — TSH: TSH: 6.045 u[IU]/mL — ABNORMAL HIGH (ref 0.350–4.500)

## 2019-01-11 MED ORDER — IOHEXOL 300 MG/ML  SOLN
75.0000 mL | Freq: Once | INTRAMUSCULAR | Status: AC | PRN
  Administered 2019-01-11: 75 mL via INTRAVENOUS

## 2019-01-18 ENCOUNTER — Other Ambulatory Visit: Payer: Self-pay

## 2019-01-18 ENCOUNTER — Inpatient Hospital Stay (HOSPITAL_COMMUNITY): Payer: Medicare Other | Attending: Hematology | Admitting: Hematology

## 2019-01-18 ENCOUNTER — Encounter (HOSPITAL_COMMUNITY): Payer: Self-pay | Admitting: Dietician

## 2019-01-18 ENCOUNTER — Encounter (HOSPITAL_COMMUNITY): Payer: Self-pay | Admitting: Hematology

## 2019-01-18 VITALS — BP 117/51 | HR 73 | Temp 98.6°F | Resp 18 | Wt 148.4 lb

## 2019-01-18 DIAGNOSIS — R946 Abnormal results of thyroid function studies: Secondary | ICD-10-CM | POA: Insufficient documentation

## 2019-01-18 DIAGNOSIS — Z923 Personal history of irradiation: Secondary | ICD-10-CM | POA: Insufficient documentation

## 2019-01-18 DIAGNOSIS — Z79899 Other long term (current) drug therapy: Secondary | ICD-10-CM | POA: Diagnosis not present

## 2019-01-18 DIAGNOSIS — Z87891 Personal history of nicotine dependence: Secondary | ICD-10-CM | POA: Diagnosis not present

## 2019-01-18 DIAGNOSIS — J384 Edema of larynx: Secondary | ICD-10-CM | POA: Insufficient documentation

## 2019-01-18 DIAGNOSIS — C099 Malignant neoplasm of tonsil, unspecified: Secondary | ICD-10-CM | POA: Diagnosis present

## 2019-01-18 DIAGNOSIS — F4310 Post-traumatic stress disorder, unspecified: Secondary | ICD-10-CM

## 2019-01-18 DIAGNOSIS — Z9221 Personal history of antineoplastic chemotherapy: Secondary | ICD-10-CM | POA: Insufficient documentation

## 2019-01-18 DIAGNOSIS — N189 Chronic kidney disease, unspecified: Secondary | ICD-10-CM | POA: Diagnosis not present

## 2019-01-18 DIAGNOSIS — Z933 Colostomy status: Secondary | ICD-10-CM | POA: Insufficient documentation

## 2019-01-18 DIAGNOSIS — R1310 Dysphagia, unspecified: Secondary | ICD-10-CM | POA: Insufficient documentation

## 2019-01-18 DIAGNOSIS — E785 Hyperlipidemia, unspecified: Secondary | ICD-10-CM | POA: Diagnosis not present

## 2019-01-18 DIAGNOSIS — M3500 Sicca syndrome, unspecified: Secondary | ICD-10-CM

## 2019-01-18 MED ORDER — PILOCARPINE HCL 5 MG PO TABS: 5.0000 mg | ORAL_TABLET | Freq: Three times a day (TID) | ORAL | 6 refills | Status: DC

## 2019-01-18 NOTE — Patient Instructions (Signed)
[place] at [facility] Discharge Instructions  Follow up in 2 months    Thank you for choosing [place] at [facility] to provide your oncology and hematology care.  To afford each patient quality time with our provider, please arrive at least 15 minutes before your scheduled appointment time.   If you have a lab appointment with the [facility] please come in thru the  Main Entrance and check in at the main information desk  You need to re-schedule your appointment should you arrive 10 or more minutes late.  We strive to give you quality time with our providers, and arriving late affects you and other patients whose appointments are after yours.  Also, if you no show three or more times for appointments you may be dismissed from the clinic at the providers discretion.     Again, thank you for choosing [facility].  Our hope is that these requests will decrease the amount of time that you wait before being seen by our physicians.       _____________________________________________________________  Should you have questions after your visit to [facility], please contact our office at [phone] between the hours of 8:00 a.m. and 4:30 p.m.  Voicemails left after 4:00 p.m. will not be returned until the following business day.  For prescription refill requests, have your pharmacy contact our office and allow 72 hours.    Cancer Center Support Programs:   > Cancer Support Group  2nd Tuesday of the month 1pm-2pm, Journey Room

## 2019-01-18 NOTE — Progress Notes (Signed)
Nutrition Follow-up 68 y/o male PMHx HLD, HOH, CKD, w/  little prior medical follow up. Presented to [facility] 06/02/18 after referred by local ENT MD for large L tonsillar mass s/p biopsy. + for SCC.  Begun radiation 7/22 & chemo 7/23. S/P peg placement 8/14 and start of Tube feeding 8/16. Finished radiation 9/12 and chemo 9/25.   "Ive given up on everything".   Today, pt is extremely frustrated, depressed and angry over his lack of progress. Speaks w/ raised voice and frequent expletives. He feels he "is being given the run around by the doctors". He says he has noticed ZERO improvement in his secretions, trismus or ability to swallow since last seen. He says the ST exercises are "pointless". They cause him pain and he sees no reason to continue them when they dont help. He says the same thing about gargling and dry swallow attempts. He reports a lot of financial stress; he is upset about the visit co-pays, "wear and tare" on his car and gas money he spends to come to these appts, when he believes nothing of benefit comes from them. He reports having little joy in life right now and says "Im alive....so what?"  RD tried to note small improvements. RD noted that, subjectively, he appears to have more lingual ROM. He also says that he can taste now - just cant swallow - which RD noted was a small victory. These did little to change his mood.   RD initially only received sarcastic responses to questions and was unable to find out any info about his tube feeds - though later, after he had calmed down, RD found out he is still infusing 4 cans of Osmolite 1.5 and "maybe 1 can of Ensure if I feel like it". He requests a new port for his PEG d/t bacterial/fungal buildup.  4 cans of Osmolite 1.5 +1 Ensure Enlive provides: 1770 kcals, 80 g Pro and 904 mls free water.   While RD was unable to elicit information regarding flush amounts, he historically has reported infusing 2x 16.9 bottles of water /day. (+1014 from  water flushes)   Wt today is 148 lbs 6.4 oz. He is down maybe 1 lb in the last 6 weeks. Prior to December, he had maintained a weight of 150-152 since start of October. Long term, he is down 27.5 lbs (15.6% bw)-meet malnutrition criteria.   Wt Readings from Last 10 Encounters:  01/18/19 148 lb 6.4 oz (67.3 kg)  12/07/18 149 lb 11.2 oz (67.9 kg)  10/26/18 152 lb (68.9 kg)  10/19/18 152 lb (68.9 kg)  10/15/18 150 lb 9 oz (68.3 kg)  09/27/18 151 lb (68.5 kg)  09/17/18 157 lb (71.2 kg)  09/16/18 156 lb 12.8 oz (71.1 kg)  09/15/18 157 lb 1.6 oz (71.3 kg)  09/14/18 155 lb (70.3 kg)   MEDICATIONS: Chemo: COMPLETED Other Meds: Liquid morphine, Percocet, Carafate, Compazine   LABS:  Last labs drawn 1/21 were unremarkable  ANTHROPOMETRICS: Height:  Ht Readings from Last 1 Encounters:  08/24/18 5' 8" (1.727 m)   Weight:  Wt Readings from Last 1 Encounters:  01/18/19 148 lb 6.4 oz (67.3 kg)   BMI:  BMI Readings from Last 1 Encounters:  01/18/19 22.56 kg/m   UBW: 182 when presenting In June IBW: 70 kg Wt change:  -1 lb in the last 6 weeks.  Prior to Dec, maintained a wt of 150-152 since start of Oct. Long term, he is down 27.5 lbs (15.6% bw)-meets mal criteria  Re-Estimated needs:  Energy: 1700-1900 kcals (25-28 kcal/kg bw) Protein: 80-94g Pro (1.2-1.4 g/kg bw) Fluid: >2  L fluid (30 ml/kcal)   NUTRITION DIAGNOSIS:  Inability to eat related to dysphagia as evidenced by recommendation for NPO status by ST    DOCUMENTATION CODES:  Not applicable at this time.   INTERVENTION:   Pt is now four >4 months out from chemoradiation. Attempts at TF weaning have not yet begun because he is still unable to functionally swallow, at all.   Vast majority of conversation today was supportive listening. He was quite upset and RD offered what support he could. While he did not voice frank SI, he frequently suggests that life is rather meaningless to him right now d/t lack of QOL. Even  things that had brought him joy in the past- his dog, his S.O. and his tools- no longer bring pleasure. He says his peripheral neuropathy from the chemo "makes any of my hobbies impossible to do". He largely just spends his days on the couch, watching TV. He says he does not have the funds to drive anywhere or do anything.  Pts wt seems to gradually be trending down. Based on todays conversation, this is largely felt to be d/t depression. Multiple times he shows a frank apathetic attitude to continued ST treatments and his medical care in general, as it makes no difference to him. It is highly likely there are many days he does not meet his TF goal d/t his mood.   Spoke w/ MD. MD noted he was going to place pt on salagen. If his secretions can be thinned, potentially he will not need to be coughing them up (which he says he does 24/7 right now). Discussed potentially referring patient to support groups for H&N patients who have finished tx - question if there are any support groups at [facility] that help with post-tx swallow rehabilitation. Nurse Navigator stated she would reach out to H&N navigator at [facility] and ask about resources.   RD asked pt if he would be interested in attending any type of support groups for post-tx H&N cancer pts. He said not if he had to drive >91 min. RD asked if he would change his mind if there was anyway they could work with him to restore his swallow fx. He again said no. Nurse Nav stated she would check anyway.   RD provided pt with an additional case of Ensure today. RD also spoke with the financial advisor about pt's fincnail hardship and she was able to provide the pt w/ 20$ in gas/food gift cards, which pt was thankful for. He was in much better spirits on his way out. RD will f/u with [facility] representative to see if they can send pt a new port for his PEG tube.   GOAL:  Patient will meet greater than or equal to 90% of their needs  NOT MET  MONITOR:  Labs, Weight trends,  TF tolerance, I & O's, PO intake, swallow function  Next Visit:  To be determined   [name] RD, LDN, CNSC Clinical Nutrition Available Tues-Sat via Pager: [phone] 01/18/2019 5:42 PM

## 2019-01-18 NOTE — Assessment & Plan Note (Signed)
1.  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 Benjamine Mola on 05/13/2018, status post biopsy consistent with squamous cell carcinoma, P 16+ - Baseline hearing loss and chronic kidney disease with creatinine of 1.34. - Pretreatment oropharyngeal examination shows a necrotic left tonsillar mass, not clearly visualized secondary to increased gag reflex.  There is a left lower neck lymph node palpable.  PET CT scan showed level 2, 3, 5 metastatic nodes on the left neck and mildly metabolic right level 2 lymph node along with the left tonsillar primary.   - He received 3 cycles of carboplatin and 5-FU and radiation therapy from 07/13/2018 through 09/13/2018. - He was hospitalized after cycle 2 with neutropenic fever. - CT of the neck on 10/15/2018 showed postradiation changes in the neck with increased supraglottic edema with no new or progressive disease.   -Physical exam today did not reveal any lymphadenopathy in the neck.  No oral masses.  Trismus present. -We reviewed the results of the CT soft tissue neck dated 01/11/2019 which shows posttreatment changes in the neck without evidence of new or progressive disease.  There is persistent large right mastoid effusion and right middle ear effusion. -he will be seeing an ENT doctor in [place] for it. - His TSH is mildly elevated at 6.0.  We will monitor it closely. -He will be seen back in 2 months for follow-up.  2.  Nutrition: -He is taking in 2 cans of Osmolite twice daily via PEG tube.  He also gets in 1 can of Ensure. -He is not able to swallow any type of foods. -He complained of thick secretions.  He is very frustrated that he is not able to swallow.  I have recommended Salagen 5 mg 3 times a day to increase it to 4 times a day if it helps.

## 2019-01-18 NOTE — Progress Notes (Signed)
[place] [place], [place] [address]   CLINIC:  Medical Oncology/Hematology  PCP:  [name], DO [place] [address] [phone]   REASON FOR VISIT: Follow-up for tonsillar cancer  CURRENT THERAPY: Observation per NCCN guidelines.  BRIEF ONCOLOGIC HISTORY:    Tonsillar cancer ([place])   06/02/2018 Initial Diagnosis    Tonsillar cancer ([place])    06/15/2018 -  Chemotherapy    The patient had palonosetron (ALOXI) injection 0.25 mg, 0.25 mg, Intravenous,  Once, 3 of 3 cycles Administration: 0.25 mg (07/13/2018), 0.25 mg (07/15/2018), 0.25 mg (08/10/2018), 0.25 mg (09/13/2018) CARBOplatin (PARAPLATIN) 140 mg in sodium chloride 0.9 % 100 mL chemo infusion, 70 mg/m2 = 140 mg (100 % of original dose 70 mg/m2), Intravenous,  Once, 3 of 3 cycles Dose modification: 70 mg/m2 (original dose 70 mg/m2, Cycle 1, Reason: Other (see comments), Comment: french protocol head and neck), 52.5 mg/m2 (75 % of original dose 70 mg/m2, Cycle 3, Reason: Provider Judgment) Administration: 140 mg (07/13/2018), 140 mg (07/14/2018), 140 mg (07/15/2018), 140 mg (07/19/2018), 140 mg (08/10/2018), 140 mg (08/11/2018), 140 mg (08/12/2018), 140 mg (08/13/2018), 140 mg (09/13/2018), 140 mg (09/14/2018), 100 mg (09/15/2018), 100 mg (09/16/2018) fluorouracil (ADRUCIL) 4,750 mg in sodium chloride 0.9 % 55 mL chemo infusion, 600 mg/m2/day = 4,750 mg (100 % of original dose 600 mg/m2/day), Intravenous, 4D (96 hours ), 3 of 3 cycles Dose modification: 600 mg/m2/day (original dose 600 mg/m2/day, Cycle 1, Reason: Other (see comments), Comment: french protocol head and neck) Administration: 4,750 mg (07/13/2018), 4,750 mg (08/10/2018), 4,750 mg (09/13/2018)  for chemotherapy treatment.       CANCER STAGING: Cancer Staging Tonsillar cancer ([place]) Staging form: Pharynx - HPV-Mediated Oropharynx, AJCC 8th Edition - Clinical stage from 06/02/2018: Stage II (cT3, cN2, cM0) -  Unsigned    INTERVAL HISTORY:  [name] 68 y.o. male returns for routine follow-up for tonsillar cancer. He is here today and still having problems with his swallowing. He has thick sputum and is unable to eat. He is still using tube feeding and drinking ensure at night. He is very frustrated and wants to try medication to help. Denies any nausea, vomiting, or diarrhea. Denies any new pains. Had not noticed any recent bleeding such as epistaxis, hematuria or hematochezia. Denies recent chest pain on exertion, shortness of breath on minimal exertion, pre-syncopal episodes, or palpitations. Denies any numbness or tingling in hands or feet. Denies any recent fevers, infections, or recent hospitalizations. Patient reports appetite at 25% and energy level at 75%.   REVIEW OF SYSTEMS:  Review of Systems  HENT:   Positive for trouble swallowing.   All other systems reviewed and are negative.    PAST MEDICAL/SURGICAL HISTORY:  Past Medical History:  Diagnosis Date  . HOH (hard of hearing)   . Hyperlipidemia   . Mass of oral cavity 05/12/2018  . Positive FIT (fecal immunochemical test) 05/12/2018  . PTSD (post-traumatic stress disorder)    has not been offically diagnosised  . Tonsillar cancer ([facility]) 06/02/2018   Past Surgical History:  Procedure Laterality Date  . APPENDECTOMY  1969  . COLONOSCOPY W/ POLYPECTOMY     remote past  . ESOPHAGOGASTRODUODENOSCOPY  08/04/2018   Dr. Arnoldo Morale: erythema of larynx, normal stomach, normal duodenum. PEG Placed 20 F, bumper at 2.5 cm  . ESOPHAGOGASTRODUODENOSCOPY (EGD) WITH PROPOFOL N/A 08/04/2018   Procedure: ESOPHAGOGASTRODUODENOSCOPY (EGD) WITH PROPOFOL;  Surgeon: Aviva Signs, MD;  [facility];  Service: Gastroenterology;  Laterality: N/A;  . FINGER FRACTURE SURGERY Left   . MULTIPLE EXTRACTIONS WITH ALVEOLOPLASTY N/A 06/15/2018   Procedure: Extraction of tooth #'s 2,12,13,18,20,and 21 with alveoloplasty and gross debridement of remaining  teeth;  Surgeon: Lenn Cal, DDS;  Location: [facility];  Service: Oral Surgery;  Laterality: N/A;  . PEG PLACEMENT N/A 08/04/2018   Procedure: PERCUTANEOUS ENDOSCOPIC GASTROSTOMY (PEG) PLACEMENT;  Surgeon: Aviva Signs, MD;  [facility];  Service: Gastroenterology;  Laterality: N/A;  . PORTACATH PLACEMENT Left 07/02/2018   Procedure: INSERTION PORT-A-CATH;  Surgeon: Aviva Signs, MD;  [facility];  Service: General;  Laterality: Left;     SOCIAL HISTORY:  Social History   Socioeconomic History  . Marital status: Divorced    Spouse name: Not on file  . Number of children: Not on file  . Years of education: Not on file  . Highest education level: Not on file  Occupational History  . Occupation: English as a second language teacher, retired  Scientific laboratory technician  . Financial resource strain: Not on file  . Food insecurity:    Worry: Not on file    Inability: Not on file  . Transportation needs:    Medical: Not on file    Non-medical: Not on file  Tobacco Use  . Smoking status: Former Smoker    Years: 15.00    Types: Cigarettes    Last attempt to quit: 2000    Years since quitting: 20.0  . Smokeless tobacco: Never Used  Substance and Sexual Activity  . Alcohol use: Yes    Alcohol/week: 12.0 standard drinks    Types: 12 Cans of beer per week  . Drug use: Never  . Sexual activity: Not Currently  Lifestyle  . Physical activity:    Days per week: Not on file    Minutes per session: Not on file  . Stress: Not on file  Relationships  . Social connections:    Talks on phone: Not on file    Gets together: Not on file    Attends religious service: Not on file    Active member of club or organization: Not on file    Attends meetings of clubs or organizations: Not on file    Relationship status: Not on file  . Intimate partner violence:    Fear of current or ex partner: Not on file    Emotionally abused: Not on file    Physically abused: Not on file    Forced sexual activity: Not on file   Other Topics Concern  . 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  He lives independently with his dog here in [place].  He formally lived in [place].    FAMILY HISTORY:  Family History  Problem Relation Age of Onset  . Diabetes Father        died at age 38   . Colon cancer Neg Hx     CURRENT MEDICATIONS:  Outpatient Encounter Medications as of 01/18/2019  Medication Sig  . Amino Acids-Protein Hydrolys (FEEDING SUPPLEMENT, PRO-STAT SUGAR FREE 64,) LIQD Place 30 mLs into feeding tube daily.  . Nutritional Supplements (FEEDING SUPPLEMENT, OSMOLITE 1.5 CAL,) LIQD Place 237 mLs into feeding tube 2 (two) times daily.  . sodium fluoride (FLUORISHIELD) 1.1 % GEL dental gel Instill one drop of gel per tooth space of fluoride tray. Place over teeth for 5 minutes. Remove. Spit out excess. Repeat nightly. (Patient taking  differently: Place 1 drop onto teeth See admin instructions. Instill one drop of gel per tooth space of fluoride tray. Place over teeth for 5 minutes. Remove. Spit out excess. Repeat nightly.)  . ibuprofen (ADVIL,MOTRIN) 200 MG tablet Take 400 mg by mouth every 6 (six) hours as needed for moderate pain.   Marland Kitchen lidocaine (XYLOCAINE) 2 % solution Viscous lidocaine 2%/ Maalox 1:1 mixture. Swish and swallow 1 tablespoon four times a day100 (Patient not taking: Reported on 01/18/2019)  . lidocaine-prilocaine (EMLA) cream Apply to affected area once (Patient not taking: Reported on 01/18/2019)  . Morphine Sulfate (MORPHINE CONCENTRATE) 10 mg / 0.5 ml concentrated solution Take 0.5 mLs (10 mg total) by mouth every 3 (three) hours as needed for severe pain. (Patient not taking: Reported on 01/18/2019)  . oxyCODONE-acetaminophen (PERCOCET) 5-325 MG tablet Take one tablet by mouth every 4 hours as needed for pain. (Patient not taking: Reported on 01/18/2019)  . pilocarpine (SALAGEN) 5 MG tablet Take 1 tablet (5 mg total) by mouth 3 (three)  times daily.  . prochlorperazine (COMPAZINE) 10 MG tablet Take 1 tablet (10 mg total) by mouth every 6 (six) hours as needed (Nausea or vomiting). (Patient not taking: Reported on 01/18/2019)  . sucralfate (CARAFATE) 1 GM/10ML suspension Carafate 1gm/58ml & Viscous lidocaine 2% 1:1 mixture.  Swish and swallow 1 tablespoon four times a day. (Patient not taking: Reported on 01/18/2019)  . [DISCONTINUED] fluconazole (DIFLUCAN) 100 MG tablet Take 1 tablet (100 mg total) by mouth daily.   No facility-administered encounter medications on file as of 01/18/2019.     ALLERGIES:  No Known Allergies   PHYSICAL EXAM:  ECOG Performance status: 1  Vitals:   01/18/19 1503  BP: (!) 117/51  Pulse: 73  Resp: 18  Temp: 98.6 F (37 C)  SpO2: 100%   Filed Weights   01/18/19 1503  Weight: 148 lb 6.4 oz (67.3 kg)    Physical Exam Constitutional:      Appearance: Normal appearance. He is normal weight.  Neck:     Musculoskeletal: Normal range of motion and neck supple.  Cardiovascular:     Rate and Rhythm: Normal rate and regular rhythm.     Heart sounds: Normal heart sounds.  Pulmonary:     Effort: Pulmonary effort is normal.     Breath sounds: Normal breath sounds.  Musculoskeletal: Normal range of motion.  Skin:    General: Skin is warm and dry.  Neurological:     Mental Status: He is alert and oriented to person, place, and time. Mental status is at baseline.  Psychiatric:        Mood and Affect: Mood normal.        Behavior: Behavior normal.        Thought Content: Thought content normal.        Judgment: Judgment normal.   Oropharynx has no lesions.  There is mild white coating on the tongue.  There is some erythema on the buccal surface.  No palpable lymphadenopathy in the neck.   LABORATORY DATA:  I have reviewed the labs as listed.  CBC    Component Value Date/Time   WBC 2.7 (L) 01/11/2019 1218   RBC 4.38 01/11/2019 1218   HGB 13.2 01/11/2019 1218   HGB 14.2 05/12/2018  0949   HCT 41.4 01/11/2019 1218   HCT 42.0 05/12/2018 0949   PLT 185 01/11/2019 1218   PLT 256 05/12/2018 0949   MCV 94.5 01/11/2019 1218   MCV 87 05/12/2018  0949   MCH 30.1 01/11/2019 1218   MCHC 31.9 01/11/2019 1218   RDW 12.5 01/11/2019 1218   RDW 13.0 05/12/2018 0949   LYMPHSABS 0.5 (L) 01/11/2019 1218   LYMPHSABS 1.1 05/12/2018 0949   MONOABS 0.4 01/11/2019 1218   EOSABS 0.0 01/11/2019 1218   EOSABS 0.1 05/12/2018 0949   BASOSABS 0.0 01/11/2019 1218   BASOSABS 0.0 05/12/2018 0949   CMP Latest Ref Rng & Units 01/11/2019 12/01/2018 10/15/2018  Glucose 70 - 99 mg/dL 108(H) 145(H) 107(H)  BUN 8 - 23 mg/dL 22 14 11   Creatinine 0.61 - 1.24 mg/dL 0.80 0.84 0.74  Sodium 135 - 145 mmol/L 138 135 140  Potassium 3.5 - 5.1 mmol/L 3.4(L) 3.5 4.5  Chloride 98 - 111 mmol/L 97(L) 101 103  CO2 22 - 32 mmol/L 31 28 30   Calcium 8.9 - 10.3 mg/dL 9.4 9.2 9.4  Total Protein 6.5 - 8.1 g/dL 6.9 6.9 6.9  Total Bilirubin 0.3 - 1.2 mg/dL 0.5 0.9 0.9  Alkaline Phos 38 - 126 U/L 64 63 55  AST 15 - 41 U/L 18 19 16   ALT 0 - 44 U/L 20 16 12        DIAGNOSTIC IMAGING:  I have independently reviewed the scans and discussed with the patient.   I have reviewed Francene Finders, NP's note and agree with the documentation.  I personally performed a face-to-face visit, made revisions and my assessment and plan is as follows.    ASSESSMENT & PLAN:   Tonsillar cancer ([place]) 1.  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 Benjamine Mola on 05/13/2018, status post biopsy consistent with squamous cell carcinoma, P 16+ - Baseline hearing loss and chronic kidney disease with creatinine of 1.34. - Pretreatment oropharyngeal examination shows a necrotic left tonsillar mass, not clearly visualized secondary to increased gag reflex.  There is a left lower neck lymph node palpable.  PET CT scan showed level 2, 3,  5 metastatic nodes on the left neck and mildly metabolic right level 2 lymph node along with the left tonsillar primary.   - He received 3 cycles of carboplatin and 5-FU and radiation therapy from 07/13/2018 through 09/13/2018. - He was hospitalized after cycle 2 with neutropenic fever. - CT of the neck on 10/15/2018 showed postradiation changes in the neck with increased supraglottic edema with no new or progressive disease.   -Physical exam today did not reveal any lymphadenopathy in the neck.  No oral masses.  Trismus present. -We reviewed the results of the CT soft tissue neck dated 01/11/2019 which shows posttreatment changes in the neck without evidence of new or progressive disease.  There is persistent large right mastoid effusion and right middle ear effusion. -he will be seeing an ENT doctor in [place] for it. - His TSH is mildly elevated at 6.0.  We will monitor it closely. -He will be seen back in 2 months for follow-up.  2.  Nutrition: -He is taking in 2 cans of Osmolite twice daily via PEG tube.  He also gets in 1 can of Ensure. -He is not able to swallow any type of foods. -He complained of thick secretions.  He is very frustrated that he is not able to swallow.  I have recommended Salagen 5 mg 3 times a day to increase it to 4 times a day if it helps.       Orders placed this encounter:  Orders Placed This Encounter  Procedures  . CBC with Differential/Platelet  . Comprehensive metabolic panel      Derek Jack, MD [place] [phone]

## 2019-01-24 ENCOUNTER — Encounter (HOSPITAL_COMMUNITY): Payer: Self-pay | Admitting: *Deleted

## 2019-01-24 NOTE — Progress Notes (Signed)
I spoke with Gayleen Orem, RN H&N navigator at [facility].  I asked for his consult in [name] survivorship care.  [name] has thick saliva and is suffering from trismus unchanged from physical therapy.  Navigator will talk with Dr. Enrique Sack and their physical therapist to see about mechanical device for trismus.  Suggestions for patient were to gargle with equal part 1/4 tsp salt and baking soda in pint of water.  Gargle several times daily as needed.  Thinning out boost for ease in swallowing.  Navigator suggested getting a Yankauer suction from home health agency so that patient can suction out secretions.    I have called patient and advised him of our plans and he verbalizes frustration because he only wants to eat.  He wants to open his mouth like normal people and take a bite and be able to swallow.  I listened to patient vent about his struggles and he states that he just thought it would happen quicker than it is.  I advised him that it can take some time and for him to keep trying.  He states that the medication given to him last week was working as his saliva is a little bit thinner.  He states that he does already have the suction at home and uses it when he is in bed but not so much when he is up and about.  He verbalizes understanding of my instructions on mouth gargle and thinning his boost for ease of swallowing.   He asked if I can get him a cap for his peg tube. He states that it has "some stuff growing in side of it and I can't get it cleaned".  I told him that I would call [facility] and get a cap ordered.

## 2019-01-26 IMAGING — RF DG SWALLOWING FUNCTION
13 series · 13 of 24 positions shown · non-contrast
Comparison: none

[Series 1: before po · 1 of 24 frames shown]
[frame 4/24]
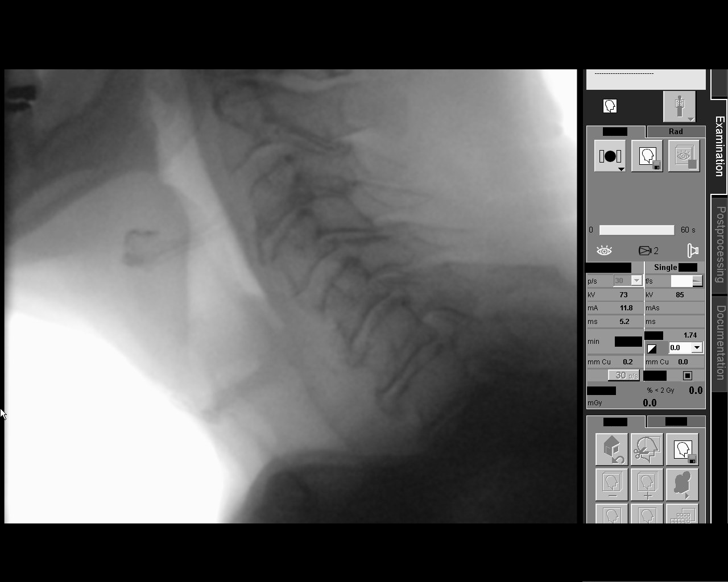

[Series 2: cup thin · 1 of 957 frames shown (1 of 3)]
[frame 144/957]
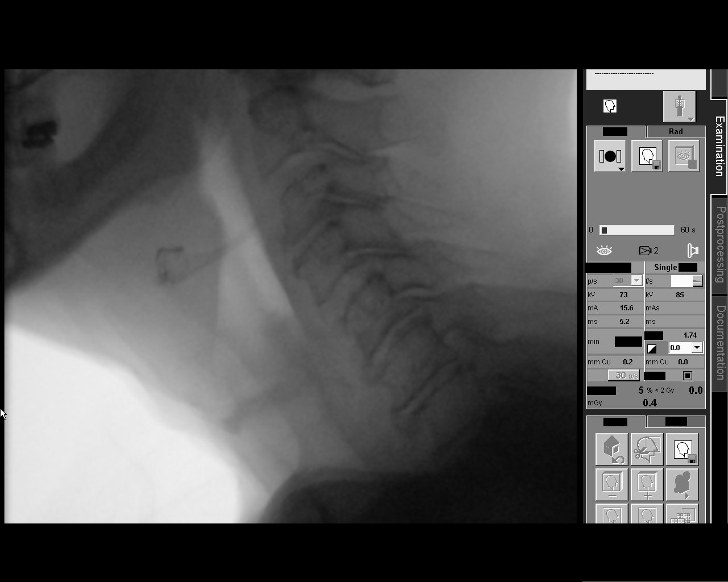

[Series 3: run · 1 of 63 frames shown (1 of 2)]
[frame 32/63]
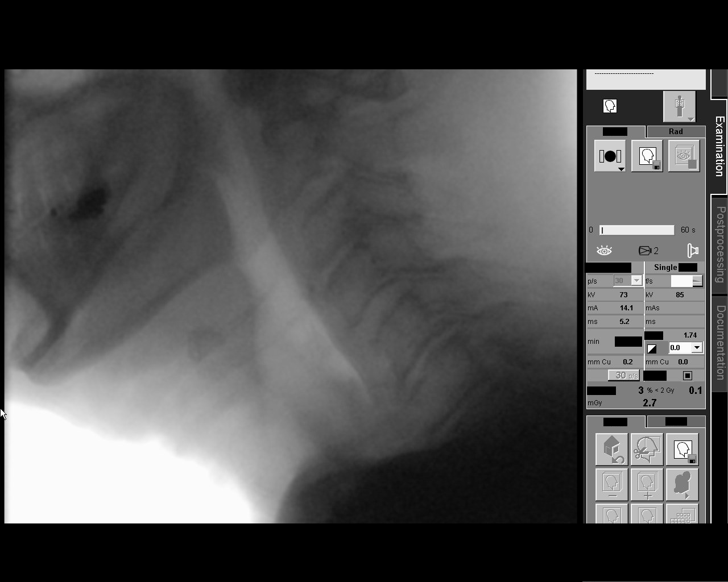

[Series 4: cup thin · 1 of 928 frames shown (2 of 3)]
[frame 465/928]
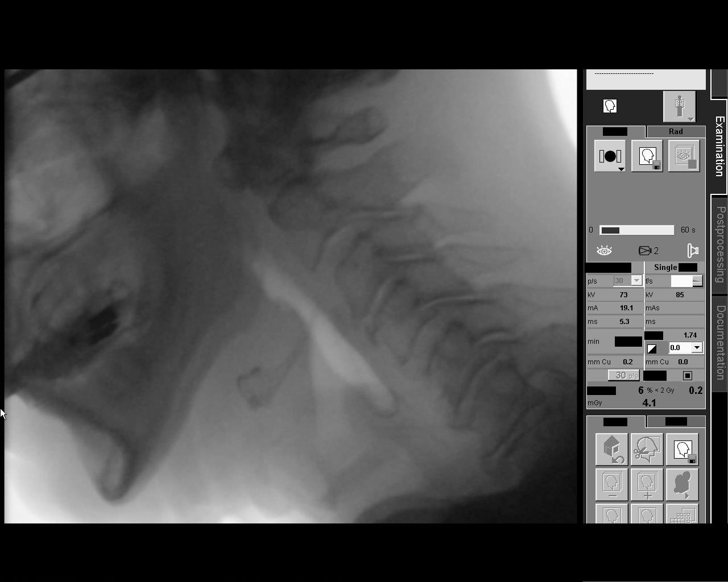

[Series 5: cup thin · 1 of 572 frames shown (3 of 3)]
[frame 342/572]
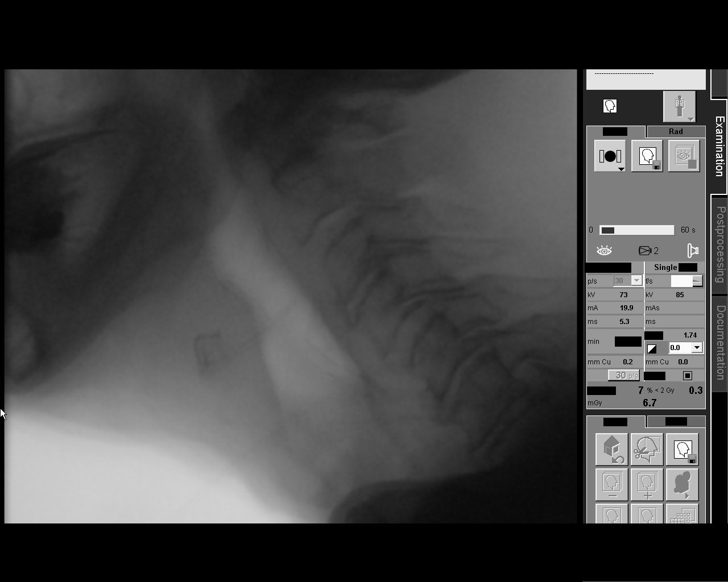

[Series 6: thin head turn left · 1 of 551 frames shown]
[frame 469/551]
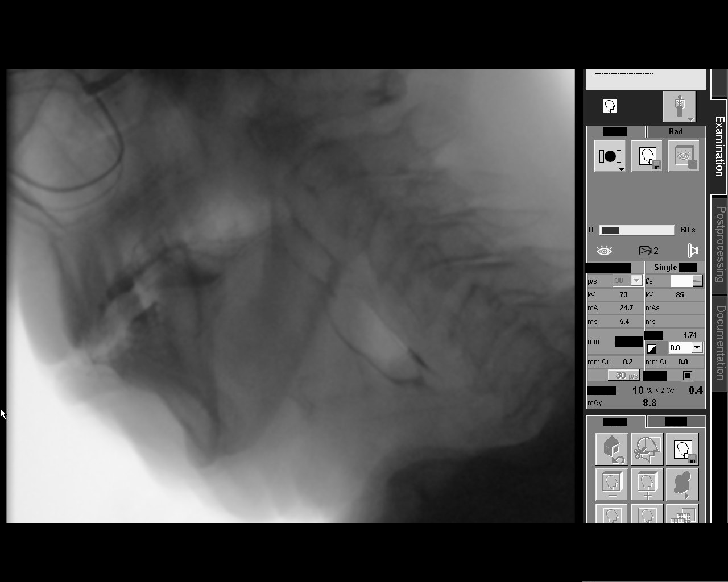

[Series 7: thin head turn right, unable · 1 of 1176 frames shown]
[frame 1000/1176]
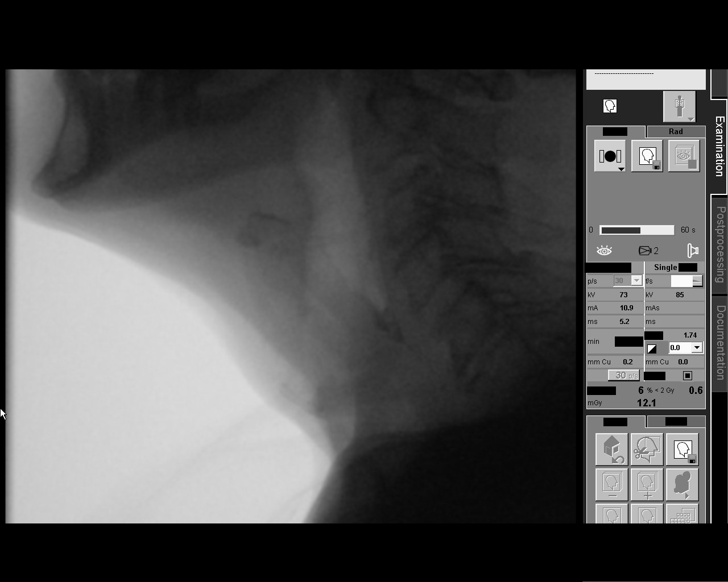

[Series 8: tsp ntl head turn left, aspiration · 1 of 468 frames shown]
[frame 235/468]
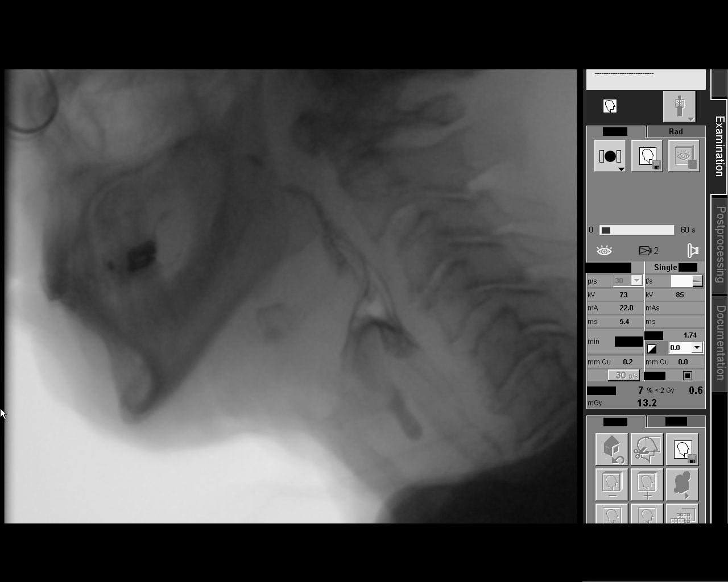

[Series 9: run · 1 of 34 frames shown (2 of 2)]
[frame 6/34]
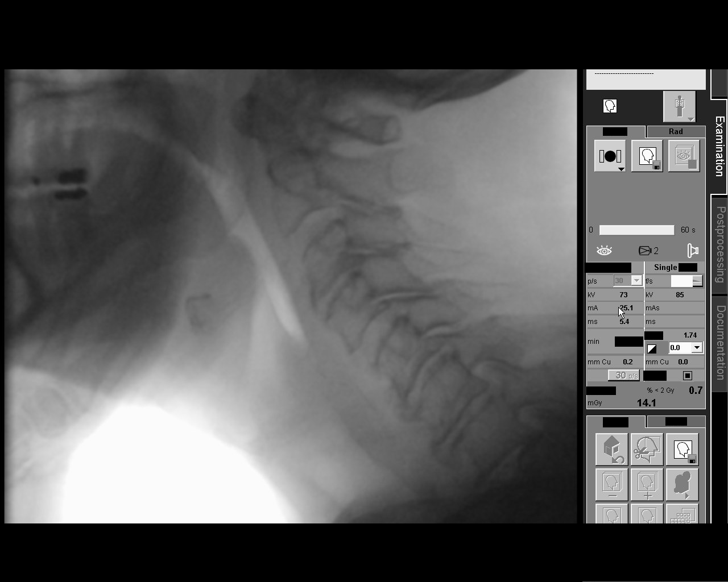

[Series 10: dry swallow · 1 of 217 frames shown]
[frame 143/217]
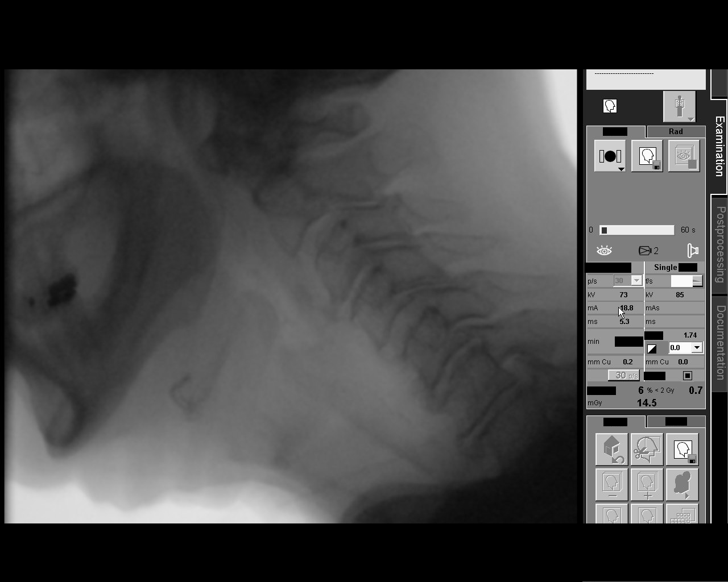

[Series 11: tiny taste puree, no swallow · 1 of 789 frames shown]
[frame 395/789]
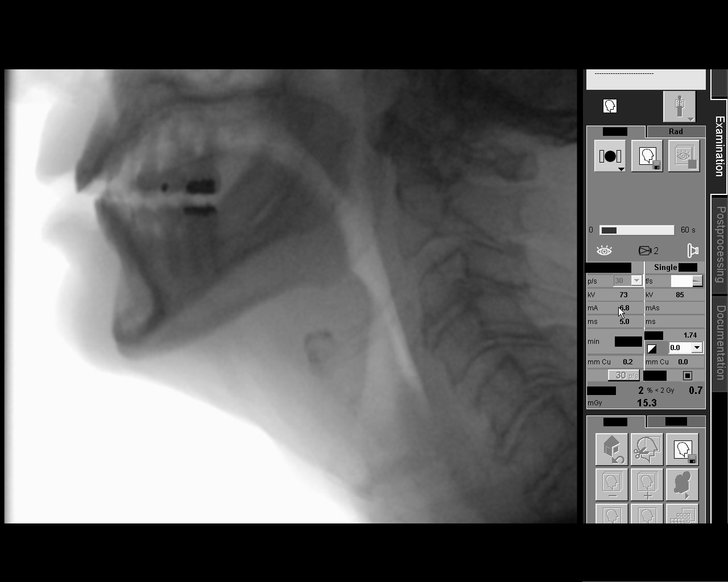

[Series 12: tiny bite puree · 1 of 826 frames shown (1 of 2)]
[frame 703/826]
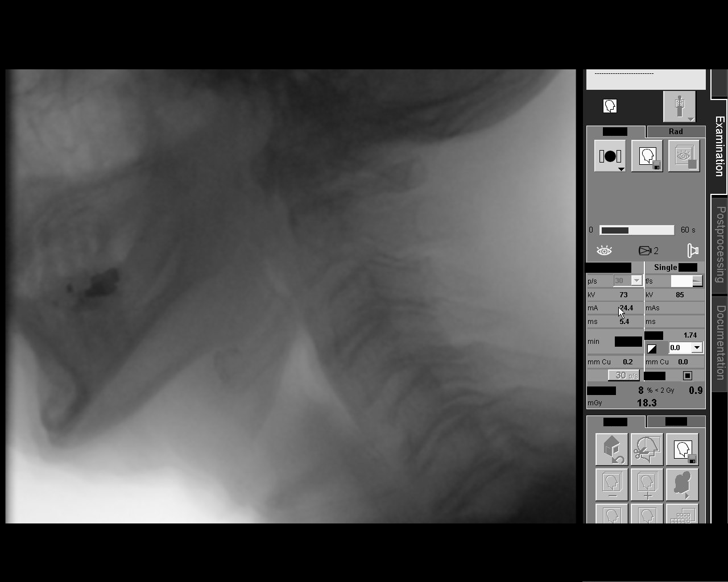

[Series 13: tiny bite puree · 1 of 1003 frames shown (2 of 2)]
[frame 969/1003]
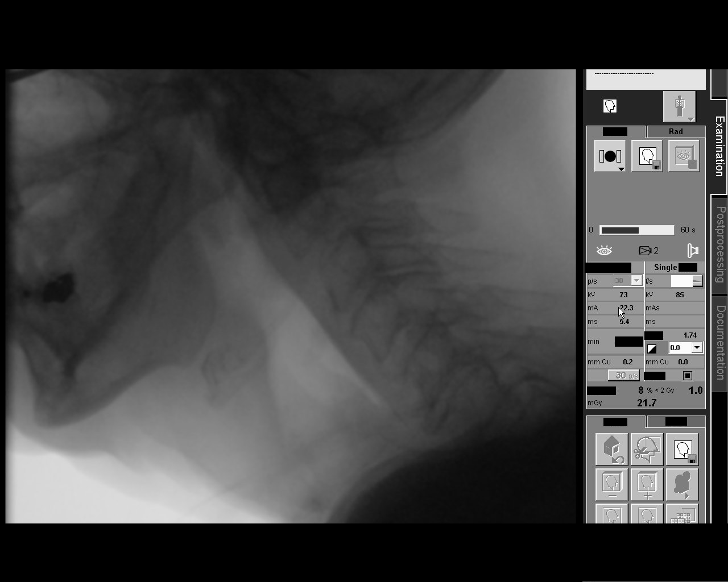

[13 of 24 positions shown; findings below may reference images not displayed]

Canned report from images found in remote index.

Refer to host system for actual result text.

## 2019-02-07 ENCOUNTER — Ambulatory Visit (HOSPITAL_COMMUNITY)
Admission: RE | Admit: 2019-02-07 | Discharge: 2019-02-07 | Disposition: A | Discharge status: Home / Self Care | Payer: Medicare Other | Source: Ambulatory Visit | Attending: Radiation Oncology | Admitting: Radiation Oncology

## 2019-02-07 DIAGNOSIS — C099 Malignant neoplasm of tonsil, unspecified: Secondary | ICD-10-CM | POA: Diagnosis present

## 2019-02-07 MED ORDER — FLUDEOXYGLUCOSE F - 18 (FDG) INJECTION
10.1500 | Freq: Once | INTRAVENOUS | Status: AC | PRN
  Administered 2019-02-07: 10.15 via INTRAVENOUS

## 2019-03-02 ENCOUNTER — Inpatient Hospital Stay (HOSPITAL_COMMUNITY): Payer: Medicare Other | Attending: Hematology

## 2019-03-02 ENCOUNTER — Encounter (HOSPITAL_COMMUNITY): Payer: Self-pay

## 2019-03-02 ENCOUNTER — Other Ambulatory Visit: Payer: Self-pay

## 2019-03-02 DIAGNOSIS — C099 Malignant neoplasm of tonsil, unspecified: Secondary | ICD-10-CM | POA: Insufficient documentation

## 2019-03-02 DIAGNOSIS — Z452 Encounter for adjustment and management of vascular access device: Secondary | ICD-10-CM | POA: Insufficient documentation

## 2019-03-02 MED ORDER — HEPARIN SOD (PORK) LOCK FLUSH 100 UNIT/ML IV SOLN
500.0000 [IU] | Freq: Once | INTRAVENOUS | Status: AC
  Administered 2019-03-02: 500 [IU] via INTRAVENOUS

## 2019-03-02 MED ORDER — SODIUM CHLORIDE 0.9% FLUSH
10.0000 mL | INTRAVENOUS | Status: DC | PRN
  Administered 2019-03-02: 10 mL via INTRAVENOUS
  Filled 2019-03-02: qty 10

## 2019-03-02 NOTE — Progress Notes (Signed)
[name] tolerated portacath flush well without complaints or incident. Port accessed with 20 gauge needle with blood return noted then flushed with 10 ml NS and 5 ml Heparin easily per protocol then de-accessed. VSS Pt discharged self ambulatory in satisfactory condition 

## 2019-03-02 NOTE — Patient Instructions (Signed)
[facility] at [facility] Discharge Instructions  Portacath flushed today per protocol. Follow-up as scheduled. Call clinic for any questions or concerns   Thank you for choosing  Cancer Center at [facility] to provide your oncology and hematology care.  To afford each patient quality time with our provider, please arrive at least 15 minutes before your scheduled appointment time.   If you have a lab appointment with the Cancer Center please come in thru the  Main Entrance and check in at the main information desk  You need to re-schedule your appointment should you arrive 10 or more minutes late.  We strive to give you quality time with our providers, and arriving late affects you and other patients whose appointments are after yours.  Also, if you no show three or more times for appointments you may be dismissed from the clinic at the providers discretion.     Again, thank you for choosing [facility].  Our hope is that these requests will decrease the amount of time that you wait before being seen by our physicians.       _____________________________________________________________  Should you have questions after your visit to [facility], please contact our office at [phone] between the hours of 8:00 a.m. and 4:30 p.m.  Voicemails left after 4:00 p.m. will not be returned until the following business day.  For prescription refill requests, have your pharmacy contact our office and allow 72 hours.    Cancer Center Support Programs:   > Cancer Support Group  2nd Tuesday of the month 1pm-2pm, Journey Room   

## 2019-03-07 NOTE — Progress Notes (Signed)
Referring Provider: [name], DO Primary Care Physician:  [name], DO  Primary GI: Dr. Gala Romney   Chief Complaint  Patient presents with   Follow-up    heme positive stool    HPI:   [name] is a 68 y.o. male presenting today with a history of heme positive stool, last seen in Aug 2019 and had requested to postpone diagnostic colonoscopy until March/April 2020. Diagnosed with tonsillar cancer June 2019. Developed severe mucositis and weight loss while undergoing treatment and had PEG tube placed Aug 2019. Remote colonoscopy at least 15 years ago. Possible polyps but he states it was "good".   Finished chemoradiation in Sept/Oct 2019. Still with difficulty swallowing but slowly improving. Tube feeds per PEG. No abdominal pain. No vomiting. No overt GI bleeding. He notes he has many bills and wants to postpone colonoscopy for now.   Past Medical History:  Diagnosis Date   HOH (hard of hearing)    Hyperlipidemia    Mass of oral cavity 05/12/2018   Positive FIT (fecal immunochemical test) 05/12/2018   PTSD (post-traumatic stress disorder)    has not been offically diagnosised   Tonsillar cancer ([place]) 06/02/2018    Past Surgical History:  Procedure Laterality Date   APPENDECTOMY  1969   COLONOSCOPY W/ POLYPECTOMY     remote past   ESOPHAGOGASTRODUODENOSCOPY  08/04/2018   Dr. Arnoldo Morale: erythema of larynx, normal stomach, normal duodenum. PEG Placed 20 F, bumper at 2.5 cm   ESOPHAGOGASTRODUODENOSCOPY (EGD) WITH PROPOFOL N/A 08/04/2018   Procedure: ESOPHAGOGASTRODUODENOSCOPY (EGD) WITH PROPOFOL;  Surgeon: Aviva Signs, MD;  [facility];  Service: Gastroenterology;  Laterality: N/A;   FINGER FRACTURE SURGERY Left    MULTIPLE EXTRACTIONS WITH ALVEOLOPLASTY N/A 06/15/2018   Procedure: Extraction of tooth #'s 2,12,13,18,20,and 21 with alveoloplasty and gross debridement of remaining teeth;  Surgeon: Lenn Cal, DDS;  Location:  [facility];  Service: Oral Surgery;  Laterality: N/A;   PEG PLACEMENT N/A 08/04/2018   Procedure: PERCUTANEOUS ENDOSCOPIC GASTROSTOMY (PEG) PLACEMENT;  Surgeon: Aviva Signs, MD;  [facility];  Service: Gastroenterology;  Laterality: N/A;   PORTACATH PLACEMENT Left 07/02/2018   Procedure: INSERTION PORT-A-CATH;  Surgeon: Aviva Signs, MD;  [facility];  Service: General;  Laterality: Left;    Current Outpatient Medications  Medication Sig Dispense Refill   Amino Acids-Protein Hydrolys (FEEDING SUPPLEMENT, PRO-STAT SUGAR FREE 64,) LIQD Place 30 mLs into feeding tube daily. 900 mL 1   ibuprofen (ADVIL,MOTRIN) 200 MG tablet Take 400 mg by mouth every 6 (six) hours as needed for moderate pain.      Nutritional Supplements (FEEDING SUPPLEMENT, OSMOLITE 1.5 CAL,) LIQD Place 237 mLs into feeding tube 2 (two) times daily. 3000 mL 6   No current facility-administered medications for this visit.     Allergies as of 03/08/2019   (No Known Allergies)    Family History  Problem Relation Age of Onset   Diabetes Father        died at age 66    Colon cancer Neg Hx     Social History   Socioeconomic History   Marital status: Divorced    Spouse name: Not on file   Number of children: Not on file   Years of education: Not on file   Highest education level: Not on file  Occupational History   Occupation: veteran, retired  Scientist, product/process development strain: Not on file  Food insecurity:    Worry: Not on file    Inability: Not on file   Transportation needs:    Medical: Not on file    Non-medical: Not on file  Tobacco Use   Smoking status: Former Smoker    Years: 15.00    Types: Cigarettes    Last attempt to quit: 2000    Years since quitting: 20.2   Smokeless tobacco: Never Used  Substance and Sexual Activity   Alcohol use: Not Currently    Alcohol/week: 12.0 standard drinks    Types: 12 Cans of beer per week   Drug use: Never   Sexual  activity: Not Currently  Lifestyle   Physical activity:    Days per week: Not on file    Minutes per session: Not on file   Stress: Not on file  Relationships   Social connections:    Talks on phone: Not on file    Gets together: Not on file    Attends religious service: Not on file    Active member of club or organization: Not on file    Attends meetings of clubs or organizations: Not on file    Relationship status: Not on file  Other Topics Concern   Not on file  Social History Narrative   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  He lives independently with his dog here in [place].  He formally lived in [place].    Review of Systems: Gen: Denies fever, chills, anorexia. Denies fatigue, weakness, weight loss.  CV: Denies chest pain, palpitations, syncope, peripheral edema, and claudication. Resp: Denies dyspnea at rest, cough, wheezing, coughing up blood, and pleurisy. GI: see HPI Derm: Denies rash, itching, dry skin Psych: Denies depression, anxiety, memory loss, confusion. No homicidal or suicidal ideation.  Heme: Denies bruising, bleeding, and enlarged lymph nodes.  Physical Exam: BP (!) 106/54    Pulse 80    Temp 98.8 F (37.1 C) (Oral)    Ht 5\' 8"  (1.727 m)    Wt 147 lb 3.2 oz (66.8 kg)    BMI 22.38 kg/m  General:   Alert and oriented. No distress noted. Pleasant and cooperative.  Head:  Normocephalic and atraumatic. Eyes:  Conjuctiva clear without scleral icterus. Mouth:  Oral mucosa pink and moist.  Abdomen:  +BS, soft, non-tender and non-distended. No rebound or guarding. No HSM or masses noted. PEG tube intact, site without erythema.  Msk:  Symmetrical without gross deformities. Normal posture. Extremities:  Without edema. Neurologic:  Alert and  oriented x4 Psych:  Alert and cooperative. Normal mood and affect.

## 2019-03-08 ENCOUNTER — Encounter (HOSPITAL_COMMUNITY): Payer: Self-pay | Admitting: Dietician

## 2019-03-08 ENCOUNTER — Ambulatory Visit (INDEPENDENT_AMBULATORY_CARE_PROVIDER_SITE_OTHER): Payer: Medicare Other | Admitting: Gastroenterology

## 2019-03-08 ENCOUNTER — Encounter: Payer: Self-pay | Admitting: Gastroenterology

## 2019-03-08 ENCOUNTER — Other Ambulatory Visit: Payer: Self-pay

## 2019-03-08 VITALS — BP 106/54 | HR 80 | Temp 98.8°F | Ht 68.0 in | Wt 147.2 lb

## 2019-03-08 DIAGNOSIS — R195 Other fecal abnormalities: Secondary | ICD-10-CM | POA: Diagnosis not present

## 2019-03-08 NOTE — Patient Instructions (Signed)
Please let us know when you are ready to pursue a colonoscopy.  I hope you can continue to do well! Keep up the great attitude!  Please call us if you need anything.  I enjoyed seeing you again today! As you know, I value our relationship and want to provide genuine, compassionate, and quality care. I welcome your feedback. If you receive a survey regarding your visit,  I greatly appreciate you taking time to fill this out. See you next time!  Annitta Needs, PhD, ANP-BC [facility]

## 2019-03-08 NOTE — Progress Notes (Signed)
Nutrition Brief Note  Patient unexpectedly showed up to cancer center today, requesting ensure.   Pt reports an equivocal response to salagen. He cant really tell if it helped thin his secretions or not. However it caused hyperhidrosis, which was so bad that he did not refill the medication. He still cannot swallow anything. He still has xerostomia and thick secretions that he must expel. He does report slight improvement in his trismus. He feels fibrotic changes in his neck and has been trying to massage these "knots".    He is upfront about being depressed. He says he sometimes says to himself "whats the point". He says he has no one in his life. No one talks to him or calls him, not even family. RD again recommended support groups. He says he has become more amenable to these and has actually responded to several [place] inquiries asking him to attend functions. However, he says he has missed all of these d/t accidentally falling asleep.  He expresses great desire to put other liquids through his tube: ice cream, soups, coffee, even liquor. RD reviewed why this is not recommended, but if he feels this will bring some quality to his life when he currently has none, then RD was OK with it.   RD discussed an opportunity to undergo a new H&N specific rehab program with [facility] speech therapist. However, he would need to compliant with several weekly appointments. Surprisingly he says he is open to this, "as long as I dont have to go to [employer]".   RD provided pt a case of Ensure. Provided supportive listening. Gave pt some advice on purchasing oral supplements in retail setting. RD also gave pt his contact info, which he had misplaced. Will apprise SLP that pt is amenable to further rehab.  [name] RD, LDN, CNSC Clinical Nutrition Available Tues-Sat via Pager: [phone] 03/08/2019 1:05 PM

## 2019-03-08 NOTE — Assessment & Plan Note (Signed)
68 year old male with heme positive stool, last seen in Aug 2019 and had requested to postpone diagnostic colonoscopy until March/April 2020. Diagnosed with tonsillar cancer June 2019. Developed severe mucositis and weight loss while undergoing treatment and had PEG tube placed Aug 2019. Remote colonoscopy at least 15 years ago. He has completed chemoradiation. He still desires to hold off on colonoscopy currently despite the discussion of risks, benefits, and unable to exclude occult malignancy. He understands and will call us when he is ready to proceed.

## 2019-03-09 NOTE — Progress Notes (Signed)
cc'd to pcp 

## 2019-03-17 ENCOUNTER — Encounter: Payer: Self-pay | Admitting: Dietician

## 2019-03-17 NOTE — Progress Notes (Signed)
Received voicemail 3/24 from pt. In message, he reports that he has developed slight hemoptysis. He also requests further Ensure assistance. Finally, pt had non emergent questions regarding his plan of care. RD passed these concerns onto nurse navigator that afternoon.   Today, RD called pt to personally follow up. He still is requesting ensure and would like to come pick it up today. Regarding his hemoptysis, he says this developed over the weekend. He says it does not occur every time he coughs, but often enough that he is concerned. He has seen "red globs" a couple times.   RD discussed situation with nursing navigator. She called and spoke to pt and arranged a pick-up that will minimize pts exposure to the environment- he will receive 2 cases of Ensure next week- He is requesting weekly cases and this will minimize his exposure. She also spoke with him further regarding his hemoptysis.   RD will plan to follow up with pt remotely on Tuesday.    [name] RD, LDN, CNSC Clinical Nutrition Available Tues-Sat via Pager: [phone] 03/17/2019 9:37 AM

## 2019-03-21 ENCOUNTER — Other Ambulatory Visit: Payer: Self-pay

## 2019-03-22 ENCOUNTER — Other Ambulatory Visit (HOSPITAL_COMMUNITY): Payer: Self-pay | Admitting: Dietician

## 2019-03-22 ENCOUNTER — Encounter (HOSPITAL_COMMUNITY): Payer: Self-pay | Admitting: Dietician

## 2019-03-22 ENCOUNTER — Inpatient Hospital Stay (HOSPITAL_COMMUNITY): Payer: Medicare Other | Attending: Hematology | Admitting: Hematology

## 2019-03-22 ENCOUNTER — Other Ambulatory Visit: Payer: Self-pay

## 2019-03-22 ENCOUNTER — Encounter (HOSPITAL_COMMUNITY): Payer: Self-pay | Admitting: Hematology

## 2019-03-22 ENCOUNTER — Inpatient Hospital Stay (HOSPITAL_COMMUNITY): Payer: Medicare Other

## 2019-03-22 VITALS — BP 120/55 | HR 65 | Temp 98.1°F | Wt 148.0 lb

## 2019-03-22 DIAGNOSIS — Z85819 Personal history of malignant neoplasm of unspecified site of lip, oral cavity, and pharynx: Secondary | ICD-10-CM | POA: Diagnosis not present

## 2019-03-22 DIAGNOSIS — E785 Hyperlipidemia, unspecified: Secondary | ICD-10-CM | POA: Diagnosis not present

## 2019-03-22 DIAGNOSIS — F4310 Post-traumatic stress disorder, unspecified: Secondary | ICD-10-CM | POA: Diagnosis not present

## 2019-03-22 DIAGNOSIS — C099 Malignant neoplasm of tonsil, unspecified: Secondary | ICD-10-CM

## 2019-03-22 DIAGNOSIS — M3500 Sicca syndrome, unspecified: Secondary | ICD-10-CM

## 2019-03-22 LAB — COMPREHENSIVE METABOLIC PANEL
ALT: 18 U/L (ref 0–44)
AST: 24 U/L (ref 15–41)
Albumin: 4 g/dL (ref 3.5–5.0)
Alkaline Phosphatase: 69 U/L (ref 38–126)
Anion gap: 8 (ref 5–15)
BUN: 19 mg/dL (ref 8–23)
CO2: 29 mmol/L (ref 22–32)
Calcium: 9.2 mg/dL (ref 8.9–10.3)
Chloride: 102 mmol/L (ref 98–111)
Creatinine, Ser: 0.87 mg/dL (ref 0.61–1.24)
GFR calc Af Amer: >60 mL/min (ref >60)
GFR calc non Af Amer: >60 mL/min (ref >60)
Glucose, Bld: 104 mg/dL — ABNORMAL HIGH (ref 70–99)
Potassium: 4.6 mmol/L (ref 3.5–5.1)
Sodium: 139 mmol/L (ref 135–145)
Total Bilirubin: 0.7 mg/dL (ref 0.3–1.2)
Total Protein: 6.9 g/dL (ref 6.5–8.1)

## 2019-03-22 LAB — CBC WITH DIFFERENTIAL/PLATELET
Abs Immature Granulocytes: 0.01 10*3/uL (ref 0.00–0.07)
BASOS ABS: 0 10*3/uL (ref 0.0–0.1)
Basophils Relative: 1 %
EOS PCT: 2 %
Eosinophils Absolute: 0 10*3/uL (ref 0.0–0.5)
HCT: 38.7 % — ABNORMAL LOW (ref 39.0–52.0)
Hemoglobin: 12.3 g/dL — ABNORMAL LOW (ref 13.0–17.0)
IMMATURE GRANULOCYTES: 1 %
Lymphocytes Relative: 23 %
Lymphs Abs: 0.4 10*3/uL — ABNORMAL LOW (ref 0.7–4.0)
MCH: 30.1 pg (ref 26.0–34.0)
MCHC: 31.8 g/dL (ref 30.0–36.0)
MCV: 94.6 fL (ref 80.0–100.0)
Monocytes Absolute: 0.2 10*3/uL (ref 0.1–1.0)
Monocytes Relative: 11 %
Neutro Abs: 1.1 10*3/uL — ABNORMAL LOW (ref 1.7–7.7)
Neutrophils Relative %: 62 %
Platelets: 155 10*3/uL (ref 150–400)
RBC: 4.09 MIL/uL — ABNORMAL LOW (ref 4.22–5.81)
RDW: 13.2 % (ref 11.5–15.5)
WBC: 1.8 10*3/uL — ABNORMAL LOW (ref 4.0–10.5)
nRBC: 0 % (ref 0.0–0.2)

## 2019-03-22 MED ORDER — ENSURE PLUS PO LIQD: ORAL | 0 refills | Status: AC

## 2019-03-22 MED ORDER — PROMOD PO LIQD: ORAL | Status: DC

## 2019-03-22 NOTE — Progress Notes (Signed)
[address] [place] [address]   CLINIC:  Medical Oncology/Hematology  PCP:  [name] Norlander, DO [place] [id number] [phone]   REASON FOR VISIT:  Follow-up for tonsillar cancer  CURRENT THERAPY:Observation per NCCN guidelines   BRIEF ONCOLOGIC HISTORY:    Tonsillar cancer ([place])   06/02/2018 Initial Diagnosis    Tonsillar cancer ([place])    06/15/2018 -  Chemotherapy    The patient had palonosetron (ALOXI) injection 0.25 mg, 0.25 mg, Intravenous,  Once, 3 of 3 cycles Administration: 0.25 mg (07/13/2018), 0.25 mg (07/15/2018), 0.25 mg (08/10/2018), 0.25 mg (09/13/2018) CARBOplatin (PARAPLATIN) 140 mg in sodium chloride 0.9 % 100 mL chemo infusion, 70 mg/m2 = 140 mg (100 % of original dose 70 mg/m2), Intravenous,  Once, 3 of 3 cycles Dose modification: 70 mg/m2 (original dose 70 mg/m2, Cycle 1, Reason: Other (see comments), Comment: french protocol head and neck), 52.5 mg/m2 (75 % of original dose 70 mg/m2, Cycle 3, Reason: Provider Judgment) Administration: 140 mg (07/13/2018), 140 mg (07/14/2018), 140 mg (07/15/2018), 140 mg (07/19/2018), 140 mg (08/10/2018), 140 mg (08/11/2018), 140 mg (08/12/2018), 140 mg (08/13/2018), 140 mg (09/13/2018), 140 mg (09/14/2018), 100 mg (09/15/2018), 100 mg (09/16/2018) fluorouracil (ADRUCIL) 4,750 mg in sodium chloride 0.9 % 55 mL chemo infusion, 600 mg/m2/day = 4,750 mg (100 % of original dose 600 mg/m2/day), Intravenous, 4D (96 hours ), 3 of 3 cycles Dose modification: 600 mg/m2/day (original dose 600 mg/m2/day, Cycle 1, Reason: Other (see comments), Comment: french protocol head and neck) Administration: 4,750 mg (07/13/2018), 4,750 mg (08/10/2018), 4,750 mg (09/13/2018)  for chemotherapy treatment.       CANCER STAGING: Cancer Staging Tonsillar cancer ([place]) Staging form: Pharynx - HPV-Mediated Oropharynx, AJCC 8th Edition - Clinical stage from 06/02/2018: Stage II (cT3, cN2, cM0) - Unsigned     INTERVAL HISTORY:  [name] 68 y.o. male returns for routine follow-up. He is here today alone. He states that he continues tube feedings, 4 osmolite and an ensure a day. He states that he has noticed an increase in saliva.  Denies any nausea, vomiting, or diarrhea. Denies any new pains. Had not noticed any recent bleeding such as epistaxis, hematuria or hematochezia. Denies recent chest pain on exertion, shortness of breath on minimal exertion, pre-syncopal episodes, or palpitations. Denies any numbness or tingling in hands or feet. Denies any recent fevers, infections, or recent hospitalizations. Patient reports appetite at 0% and energy level at 25%.    REVIEW OF SYSTEMS:  Review of Systems  HENT:         Complains of dry mouth.  All other systems reviewed and are negative.    PAST MEDICAL/SURGICAL HISTORY:  Past Medical History:  Diagnosis Date  . HOH (hard of hearing)   . Hyperlipidemia   . Mass of oral cavity 05/12/2018  . Positive FIT (fecal immunochemical test) 05/12/2018  . PTSD (post-traumatic stress disorder)    has not been offically diagnosised  . Tonsillar cancer ([place]) 06/02/2018   Past Surgical History:  Procedure Laterality Date  . APPENDECTOMY  1969  . COLONOSCOPY W/ POLYPECTOMY     remote past  . ESOPHAGOGASTRODUODENOSCOPY  08/04/2018   Dr. Arnoldo Juarez: erythema of larynx, normal stomach, normal duodenum. PEG Placed 20 F, bumper at 2.5 cm  . ESOPHAGOGASTRODUODENOSCOPY (EGD) WITH PROPOFOL N/A 08/04/2018   Procedure: ESOPHAGOGASTRODUODENOSCOPY (EGD) WITH PROPOFOL;  Surgeon: [name] Signs, MD;  [facility];  Service: Gastroenterology;  Laterality: N/A;  .  FINGER FRACTURE SURGERY Left   . MULTIPLE EXTRACTIONS WITH ALVEOLOPLASTY N/A 06/15/2018   Procedure: Extraction of tooth #'s 2,12,13,18,20,and 21 with alveoloplasty and gross debridement of remaining teeth;  Surgeon: [name], DDS;  Location: [facility];  Service: Oral Surgery;  Laterality: N/A;  . PEG  PLACEMENT N/A 08/04/2018   Procedure: PERCUTANEOUS ENDOSCOPIC GASTROSTOMY (PEG) PLACEMENT;  Surgeon: [name] Signs, MD;  Location[facility];  Service: Gastroenterology;  Laterality: N/A;  . PORTACATH PLACEMENT Left 07/02/2018   Procedure: INSERTION PORT-A-CATH;  Surgeon: [name] Signs, MD;  Location: [facility];  Service: General;  Laterality: Left;     SOCIAL HISTORY:  Social History   Socioeconomic History  . Marital status: Divorced    Spouse name: Not on file  . Number of children: Not on file  . Years of education: Not on file  . Highest education level: Not on file  Occupational History  . Occupation: English as a second language teacher, retired  Scientific laboratory technician  . Financial resource strain: Not on file  . Food insecurity:    Worry: Not on file    Inability: Not on file  . Transportation needs:    Medical: Not on file    Non-medical: Not on file  Tobacco Use  . Smoking status: Former Smoker    Years: 15.00    Types: Cigarettes    Last attempt to quit: 2000    Years since quitting: 20.2  . Smokeless tobacco: Never Used  Substance and Sexual Activity  . Alcohol use: Not Currently    Alcohol/week: 12.0 standard drinks    Types: 12 Cans of beer per week  . Drug use: Never  . Sexual activity: Not Currently  Lifestyle  . Physical activity:    Days per week: Not on file    Minutes per session: Not on file  . Stress: Not on file  Relationships  . Social connections:    Talks on phone: Not on file    Gets together: Not on file    Attends religious service: Not on file    Active member of club or organization: Not on file    Attends meetings of clubs or organizations: Not on file    Relationship status: Not on file  . Intimate partner violence:    Fear of current or ex partner: Not on file    Emotionally abused: Not on file    Physically abused: Not on file    Forced sexual activity: Not on file  Other Topics Concern  . Not on file  Social History Narrative   [name] is a pleasant  gentleman who is a retired English as a second language teacher.  He lives independently with his dog here in [place].  He formally lived in [place].    FAMILY HISTORY:  Family History  Problem Relation Age of Onset  . Diabetes Father        died at age 62   . Colon cancer Neg Hx     CURRENT MEDICATIONS:  Outpatient Encounter Medications as of 03/22/2019  Medication Sig  . Amino Acids-Protein Hydrolys (FEEDING SUPPLEMENT, PRO-STAT SUGAR FREE 64,) LIQD Place 30 mLs into feeding tube daily.  Marland Kitchen ibuprofen (ADVIL,MOTRIN) 200 MG tablet Take 400 mg by mouth every 6 (six) hours as needed for moderate pain.   . Nutritional Supplements (FEEDING SUPPLEMENT, OSMOLITE 1.5 Juarez,) LIQD Place 237 mLs into feeding tube 2 (two) times daily.   No facility-administered encounter medications on file as of 03/22/2019.     ALLERGIES:  No Known  Allergies   PHYSICAL EXAM:  ECOG Performance status: 1  Vitals:   03/22/19 1116  BP: (!) 120/55  Pulse: 65  Temp: 98.1 F (36.7 C)  SpO2: 100%   Filed Weights   03/22/19 1116  Weight: 148 lb (67.1 kg)    Physical Exam Constitutional:      Appearance: Normal appearance.  Neck:     Comments: No oropharyngeal lesions.  Trismus present.  No palpable adenopathy. Cardiovascular:     Rate and Rhythm: Normal rate and regular rhythm.  Pulmonary:     Breath sounds: Normal breath sounds.  Abdominal:     General: Abdomen is flat. There is no distension.     Palpations: Abdomen is soft. There is no mass.  Musculoskeletal:        General: No swelling.  Skin:    General: Skin is warm.  Neurological:     General: No focal deficit present.     Mental Status: He is alert and oriented to person, place, and time.  Psychiatric:        Mood and Affect: Mood normal.        Behavior: Behavior normal.      LABORATORY DATA:  I have reviewed the labs as listed.  CBC    Component Value Date/Time   WBC 1.8 (L) 03/22/2019 1024   RBC 4.09 (L) 03/22/2019 1024   HGB 12.3 (L) 03/22/2019  1024   HGB 14.2 05/12/2018 0949   HCT 38.7 (L) 03/22/2019 1024   HCT 42.0 05/12/2018 0949   PLT 155 03/22/2019 1024   PLT 256 05/12/2018 0949   MCV 94.6 03/22/2019 1024   MCV 87 05/12/2018 0949   MCH 30.1 03/22/2019 1024   MCHC 31.8 03/22/2019 1024   RDW 13.2 03/22/2019 1024   RDW 13.0 05/12/2018 0949   LYMPHSABS 0.4 (L) 03/22/2019 1024   LYMPHSABS 1.1 05/12/2018 0949   MONOABS 0.2 03/22/2019 1024   EOSABS 0.0 03/22/2019 1024   EOSABS 0.1 05/12/2018 0949   BASOSABS 0.0 03/22/2019 1024   BASOSABS 0.0 05/12/2018 0949   CMP Latest Ref Rng & Units 03/22/2019 01/11/2019 12/01/2018  Glucose 70 - 99 mg/dL 104(H) 108(H) 145(H)  BUN 8 - 23 mg/dL 19 22 14   Creatinine 0.61 - 1.24 mg/dL 0.87 0.80 0.84  Sodium 135 - 145 mmol/L 139 138 135  Potassium 3.5 - 5.1 mmol/L 4.6 3.4(L) 3.5  Chloride 98 - 111 mmol/L 102 97(L) 101  CO2 22 - 32 mmol/L 29 31 28   Calcium 8.9 - 10.3 mg/dL 9.2 9.4 9.2  Total Protein 6.5 - 8.1 g/dL 6.9 6.9 6.9  Total Bilirubin 0.3 - 1.2 mg/dL 0.7 0.5 0.9  Alkaline Phos 38 - 126 U/L 69 64 63  AST 15 - 41 U/L 24 18 19   ALT 0 - 44 U/L 18 20 16        DIAGNOSTIC IMAGING:  I have independently reviewed the scans and discussed with the patient.   I have reviewed Venita Lick LPN's note and agree with the documentation.  I personally performed a face-to-face visit, made revisions and my assessment and plan is as follows.    ASSESSMENT & PLAN:   Tonsillar cancer ([place]) 1.  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 Benjamine Mola on 05/13/2018, status post biopsy consistent with squamous cell carcinoma, P 16+ - Baseline hearing loss and chronic kidney disease with creatinine of 1.34. - Pretreatment  oropharyngeal examination shows a necrotic left tonsillar mass, not clearly visualized secondary to increased gag reflex.  There is a left lower neck lymph node palpable.   PET CT scan showed level 2, 3, 5 metastatic nodes on the left neck and mildly metabolic right level 2 lymph node along with the left tonsillar primary.   - He received 3 cycles of carboplatin and 5-FU and radiation therapy from 07/13/2018 through 09/13/2018. - He was hospitalized after cycle 2 with neutropenic fever. -Physical exam today did not reveal any lymphadenopathy in the neck.  No oral masses.  Trismus slightly improved. -We discussed the results of the PET scan dated 02/07/2019 which shows interval resolution of the prior left tonsillar and neck adenopathy with no current findings of active malignancy.  Physical exam today did not reveal any lymphadenopathy in the neck.  No oral masses.  Trismus present. - I will see him back in 4 months with repeat labs and TSH.  I have advised him to follow-up with Dr.Yanagihara.   2.  Nutrition: -He is taking in 2 cans of Osmolite twice daily via PEG tube.  He is also drinking 1 can of Ensure. -He is not able to swallow any types of foods.  He tried Salagen but stopped it because of severe sweating.    Total time spent is 25 minutes with more than 50% of the time spent face-to-face counseling him about the side effects of chemotherapy including dry mouth and discussing scan results and coordination of care.    Orders placed this encounter:  Orders Placed This Encounter  Procedures  . CBC with Differential/Platelet  . Comprehensive metabolic panel  . TSH      [name], [place] [phone]

## 2019-03-22 NOTE — Assessment & Plan Note (Signed)
1.  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 Benjamine Mola on 05/13/2018, status post biopsy consistent with squamous cell carcinoma, P 16+ - Baseline hearing loss and chronic kidney disease with creatinine of 1.34. - Pretreatment oropharyngeal examination shows a necrotic left tonsillar mass, not clearly visualized secondary to increased gag reflex.  There is a left lower neck lymph node palpable.  PET CT scan showed level 2, 3, 5 metastatic nodes on the left neck and mildly metabolic right level 2 lymph node along with the left tonsillar primary.   - He received 3 cycles of carboplatin and 5-FU and radiation therapy from 07/13/2018 through 09/13/2018. - He was hospitalized after cycle 2 with neutropenic fever. -Physical exam today did not reveal any lymphadenopathy in the neck.  No oral masses.  Trismus slightly improved. -We discussed the results of the PET scan dated 02/07/2019 which shows interval resolution of the prior left tonsillar and neck adenopathy with no current findings of active malignancy.  Physical exam today did not reveal any lymphadenopathy in the neck.  No oral masses.  Trismus present. - I will see him back in 4 months with repeat labs and TSH.  I have advised him to follow-up with Dr.Yanagihara.   2.  Nutrition: -He is taking in 2 cans of Osmolite twice daily via PEG tube.  He is also drinking 1 can of Ensure. -He is not able to swallow any types of foods.  He tried Salagen but stopped it because of severe sweating.

## 2019-03-22 NOTE — Progress Notes (Addendum)
Nutrition Follow-up 68 y/o male PMHx HLD, HOH, CKD, w/  little prior medical follow up. Presented to [facility] 06/02/18 after referred by local ENT MD for large L tonsillar mass s/p biopsy. + for SCC.  Begun radiation 7/22 & chemo 7/23. S/P peg placement 8/14 and start of Tube feeding 8/16. Finished radiation 9/12 and chemo 9/25.   Following up with pt remotely. RD has had a couple impromptu encounters w/ pt since last formal follow-up. He still is completely unable to swallow. Regarding his PEG regimen, he recently has been requesting Ensure much more frequently. Historically, he has mixed and matched Ensure and Osmolite, infusing whichever he felt he would do better w/ at the time.   On calling pt, he reports he is still not able to eat anything. He is still fully reliant on PEG. Pt says he spoke w/ MD today about what else he can put through his tube. Per their discussion, MD was okay with putting beer, soup and other liquids through tube. Pt sounds like he will reap psychological benefit from variety- he has been infusing nothing but ensure, water and Osmolite. This has been very depressing to him. He had advocated for this last time we spoke to improve his QOL. As an example, He says has been putting Welch's juice through the tube and he has been feeling like he has "more energy"  He reports continued compliance with Following regimen: 4 cans of Osmolite 1.5 +1 Ensure Enlive provides: 1770 kcals, 80 g Pro and 904 mls free water. He just eyeballs his flush amounts. He estimates his total flushes equate to roughly 2x 16.9 bottles of water /day. (+1014 from water flushes)   Despite this report, he has been requesting a lot more Ensure recently. RD pointed this out to him and he says that feels he has been tolerating the Ensure better.    He weighs himself every morning and says he is always at 141 lbs (in morning, in underwear only). His wt at his appt today was 148 lbs, which indicates wt stability. He has  been 147-149 since December. Prior to December, he had maintained a weight of 150-152 since start of October. Long term, he is down 27.5 lbs (15.6% bw) since late July (8 months)-stilll technically meets malnutrition criteria.   Wt Readings from Last 10 Encounters:  03/22/19 148 lb (67.1 kg)  03/08/19 147 lb 3.2 oz (66.8 kg)  01/18/19 148 lb 6.4 oz (67.3 kg)  12/07/18 149 lb 11.2 oz (67.9 kg)  10/26/18 152 lb (68.9 kg)  10/19/18 152 lb (68.9 kg)  10/15/18 150 lb 9 oz (68.3 kg)  09/27/18 151 lb (68.5 kg)  09/17/18 157 lb (71.2 kg)  09/16/18 156 lb 12.8 oz (71.1 kg)   MEDICATIONS: Chemo: COMPLETED Per chart- has no relevant medications ordered   LABS:  Labs today were unremarkable.   Recent Labs  Lab 03/22/19 1024  NA 139  K 4.6  CL 102  CO2 29  BUN 19  CREATININE 0.87  CALCIUM 9.2  GLUCOSE 104*    ANTHROPOMETRICS: Height:  Ht Readings from Last 1 Encounters:  03/08/19 _0  (1.727 m)   Weight:  Wt Readings from Last 1 Encounters:  03/22/19 148 lb (67.1 kg)   BMI:  BMI Readings from Last 1 Encounters:  03/22/19 22.50 kg/m   UBW: 182 when presenting In June 2019 IBW: 70 kg Wt changes:  Stable x3 months  Prior to Dec, maintained a wt of 150-152 since start of  Oct. Long term, he is down 27.5 lbs x8 months(15.6% bw)-meets mal criteria  Re-Estimated needs:  Energy: 1700-1900 kcals (25-28 kcal/kg bw) Protein: 80-94g Pro (1.2-1.4 g/kg bw) Fluid: >2  L fluid (30 ml/kcal)   NUTRITION DIAGNOSIS:  Inability to eat related to dysphagia as evidenced by recommendation for NPO status by ST    DOCUMENTATION CODES:  Not applicable at this time.   INTERVENTION:    Pt is now >6 months out from chemoradiation. Attempts at TF weaning have not yet begun because he is still unable to functionally swallow, at all. Progress has been hindered by pts depression, financial circumstances and unwillingness to travel very far.   Pt was provided 2 cases of Ensure ENlive Today.  Given pt has been favoring the Ensure over the Osmolite. RD recommended simply changing his TF regimen over to Ensure plus. Instead of Osmolite 1.5 QID + 1 ensure, order would be for 5 ensure plus supplements via tube + 30 ml promod qd. This will provide him: 1850 kcals, 75g Pro and 900 cc free water +1049ms from flushes (~2x 16.9 oz bottles). While this does not quite meet his estimated protein needs alone, he plans to infuse protein containing liquids (creamy ice cream/ creamy soup) through tube which should allow him to easily meet rest of protein needs.    We discussed future ST therapy. Advised that d/t to COVID measures, OP therapy is currently being held. He notes he actually is Looking forwards to restarting therapy    RD provided a lot of supportive listening. Psychologically, he is upfront about being lonely/depressed. He jokingly remarks that if he owned a gun he would have "ended it a long time ago". RD urged him to continue to seek support in [place] unfortunately, he says meeting are currently closed d/t covid   RD will place order for new TF regimen w/ [facility]  GOAL:  Patient will meet greater than or equal to 90% of their needs  MET - (with TF)  MONITOR:  Labs, Weight trends, TF tolerance, I & O's, PO intake, swallow function  Next Visit:  Remote 4/14   NBurtis JunesRD, LDN, CNSC Clinical Nutrition Available Tues-Sat via Pager: [phone] 03/22/2019 12:01 PM

## 2019-03-22 NOTE — Patient Instructions (Addendum)
[place] at [facility] Discharge Instructions  You were seen today by Dr. Delton Coombes. He went over your recent scan results they looked good. He will see you back in 4 months for labs and follow up.   Thank you for choosing [place] at [facility] to provide your oncology and hematology care.  To afford each patient quality time with our provider, please arrive at least 15 minutes before your scheduled appointment time.   If you have a lab appointment with the [facility] please come in thru the  Main Entrance and check in at the main information desk  You need to re-schedule your appointment should you arrive 10 or more minutes late.  We strive to give you quality time with our providers, and arriving late affects you and other patients whose appointments are after yours.  Also, if you no show three or more times for appointments you may be dismissed from the clinic at the providers discretion.     Again, thank you for choosing [facility].  Our hope is that these requests will decrease the amount of time that you wait before being seen by our physicians.       _____________________________________________________________  Should you have questions after your visit to [facility], please contact our office at [phone] between the hours of 8:00 a.m. and 4:30 p.m.  Voicemails left after 4:00 p.m. will not be returned until the following business day.  For prescription refill requests, have your pharmacy contact our office and allow 72 hours.    Cancer Center Support Programs:   > Cancer Support Group  2nd Tuesday of the month 1pm-2pm, Journey Room

## 2019-03-25 ENCOUNTER — Other Ambulatory Visit (HOSPITAL_COMMUNITY): Payer: Self-pay | Admitting: Hematology

## 2019-04-05 ENCOUNTER — Encounter (HOSPITAL_COMMUNITY): Payer: Self-pay | Admitting: Dietician

## 2019-04-05 NOTE — Progress Notes (Signed)
Nutrition Brief note  RD tried to follow up with pt remotely. When last spoke with patient two weeks ago, we had elected to change from Osmolite to Ensure. To make up for reduced protein in regimen, 30 ml promod was added. The new orders read as:   5 ensure plus supplements via tube + 30 ml promod qd. This will provide him: 1850 kcals, 75g Pro and 900 cc free water +1067mls from flushes (~2x 16.9 oz bottles).  RD called early in day on 4/14. Got answering machinel. Left VM detailing RDs reason for call and left call back number. Did not hear back from patient during day.   RD called again afternoon of 4/15. Again, unable to reach patient.   Will try again to reach patient as schedule allows.   [name] RD, LDN, CNSC Clinical Nutrition Available Tues-Sat via Pager: [phone]  04/05/2019 9:14 AM

## 2019-04-08 ENCOUNTER — Encounter: Payer: Self-pay | Admitting: Dietician

## 2019-04-08 NOTE — Progress Notes (Signed)
RD tried again to reach pt and this time was successful.   Unfortunately, pt says he never received the Ensure Plus/Promod that RD had ordered a couple weeks ago. He eventually had to reach out to [facility] himself to request an urgent shipment because he was nearly completely out of feedings. However, when he called, the company had trouble locating the order for ensure and Promod that RD had sent in. As a result he was hastily sent a shipment of Osmolite 1.5, only for them to locate the correct TF order shortly thereafter. As such, he was told he would receive the Ensure ensure next shipment  Pt shares he has been putting several different items through his tube, including Naked Juice, Choc Milk, tomato soup and even beer. He admits that physically, these all might as well be the same thing. However psychologically, he reports deriving benefit. He feels he is able to control something in his life again.   RD provided supportive listening. Will reach out to [employer] to make sure orders are in place.   F/u ~ 2 weeks.    [name] RD, LDN, CNSC Clinical Nutrition Available Tues-Sat via Pager: [phone] 04/08/2019 3:07 PM

## 2019-05-03 ENCOUNTER — Encounter (HOSPITAL_COMMUNITY): Payer: Self-pay | Admitting: Dietician

## 2019-05-03 NOTE — Progress Notes (Signed)
Nutrition Follow-up 68 y/o male PMHx HLD, HOH, CKD, w/  little prior medical follow up. Presented to [facility] 06/02/18 after referred by local ENT MD for large L tonsillar mass s/p biopsy. + for SCC.  Begun radiation 7/22 & chemo 7/23. S/P peg placement 8/14 and start of Tube feeding 8/16. Finished radiation 9/12 and chemo 9/25.   Checking in with pt remotely. When last spoke, he had started putting miscellaneous foods/drinks through PEG. He largely was doing this for QOL purposes. He was still infusing 4 cans of Osmolite 1.5 each day.    Today, RD attempted to reach patient throughout the day, calling 3 separate times, but was unsuccessful.   His wt at his last appt was 148 lbs, which indicated wt stability. He has been 147-149 since December. Prior to December, he had maintained a weight of 150-152 since start of October. Long term, he is down 27.5 lbs (15.6% bw) since late July.  Wt Readings from Last 10 Encounters:  03/22/19 148 lb (67.1 kg)  03/08/19 147 lb 3.2 oz (66.8 kg)  01/18/19 148 lb 6.4 oz (67.3 kg)  12/07/18 149 lb 11.2 oz (67.9 kg)  10/26/18 152 lb (68.9 kg)  10/19/18 152 lb (68.9 kg)  10/15/18 150 lb 9 oz (68.3 kg)  09/27/18 151 lb (68.5 kg)  09/17/18 157 lb (71.2 kg)  09/16/18 156 lb 12.8 oz (71.1 kg)   MEDICATIONS: Chemo: COMPLETED Per chart- has no relevant medications ordered   LABS:  No labs in >1 month  ANTHROPOMETRICS: Height:  Ht Readings from Last 1 Encounters:  03/08/19 '5\' 8"'  (1.727 m)   Weight:  Wt Readings from Last 1 Encounters:  03/22/19 148 lb (67.1 kg)   BMI:  BMI Readings from Last 1 Encounters:  03/22/19 22.50 kg/m   UBW: 182 when presenting In June 2019 IBW: 70 kg Wt changes: (based on last measurement available 3/31) Stable x3 months  Prior to Dec, maintained a wt of 150-152 since start of Oct. Long term, he is down 27.5 lbs x8 months(15.6% bw)-meets mal criteria  Re-Estimated needs:  Energy: 1700-1900 kcals (25-28 kcal/kg bw) Protein:  80-94g Pro (1.2-1.4 g/kg bw) Fluid: >2  L fluid (30 ml/kcal)   NUTRITION DIAGNOSIS:  Inability to eat related to dysphagia as evidenced by recommendation for NPO status by ST    DOCUMENTATION CODES:  Not applicable at this time.   INTERVENTION:     Pt is now >8 months out from chemoradiation. Attempts at TF weaning have not yet begun because he is still unable to functionally swallow, at all. Progress has been hindered by pts depression, financial circumstances and unwillingness to travel very far.   RD was unable to reach patient today, despite calling several times at different parts of the day. Will follow up as schedule allows.   Noted pts recent encounter with [facility] radiation via [place]. Pt has asked several times about what he can put through tube to gain weight. He has been advised that his tube feeding, is the best thing he can put through his tube for weight gain, given its caloric density. However, pt does not seem to want to accept this.   GOAL:  Patient will meet greater than or equal to 90% of their needs  MET - (with TF and food items via PEG)  MONITOR:  Labs, Weight trends, TF tolerance, I & O's, PO intake, swallow function  Next Visit:  Remote 5/26  [name] RD, LDN, CNSC Clinical Nutrition Available  Tues-Sat via Pager: [phone]  05/03/2019 9:31 AM

## 2019-05-17 ENCOUNTER — Encounter (HOSPITAL_COMMUNITY): Payer: Self-pay | Admitting: Dietician

## 2019-05-17 NOTE — Progress Notes (Signed)
Nutrition Follow-up 68 y/o male PMHx HLD, HOH, CKD, w/  little prior medical follow up. Presented to [facility] 06/02/18 after referred by local ENT MD for large L tonsillar mass s/p biopsy. + for SCC.  Begun radiation 7/22 & chemo 7/23. S/P peg placement 8/14 and start of Tube feeding 8/16. Finished radiation 9/12 and chemo 9/25.   Checking in with pt remotely. When last spoke, he had started putting miscellaneous foods/drinks through PEG. He largely was doing this for QOL purposes. He was still infusing 2 cans of Osmolite 1.5 each day,"something else" in the middle of the day, and then 2 in the evening with an ensure*.   Was able to reach pt via phone. He says he has still been adhering to the above listed feeding pattern*. He increasingly has been putting various food products through his tube, mainly Naked Juice. He says these contain anywhere from 270-350 kcals and 2-31g protein. He also been putting yogurt, baby food, welches grape juice, natures promise milk, beer, nesquick and something called "shamrock" through his tube. He is trying to get a "variety".    Swallowing wise, he says he not noticed any  improvement. He has  plateaued in this area. However, he reports some modest improvements in other areas. He says he might be making more saliva. Additionally, his mouth is less sensitive than it was. He also thinks he can open his mouth slightly more. He again asks about restarting therapy. He is thinking he needs "massage therapy" to work out the tightness in his throat. He does notice some intermittent blood in his sputum  Weight wise, he weighs himself 3x a day. He is ~141 in the mornings and 145-146 in the afternoons. He weighs without clothes. He says his weight is stable.  His wt at his last appt was 148 lbs (weighs with clothes/shoes at appts). He had been 147-149 since December. Prior to December, he had maintained a weight of 150-152 since start of October. Long term, he is down 27.5 lbs (15.6% bw)  since late July.  He says he just got his 1st delivery of Ensure and promod this morning, which was the regimen we had placed orders for a little less than two months ago (pt had mistakenly gotten his old order sent so he is late to get started on this new regimen)  He says he has started taking aspirin or other nsaids on a daily basis to help with pain management. He asks, "what is the "best aspirin I can take?".   He has been getting out of house more. He is going to his shed and cleaning it up. He says he is still depressed and finds it hard to get motivated to do anything sometimes. He is working on being more positive.   Wt Readings from Last 10 Encounters:  03/22/19 148 lb (67.1 kg)  03/08/19 147 lb 3.2 oz (66.8 kg)  01/18/19 148 lb 6.4 oz (67.3 kg)  12/07/18 149 lb 11.2 oz (67.9 kg)  10/26/18 152 lb (68.9 kg)  10/19/18 152 lb (68.9 kg)  10/15/18 150 lb 9 oz (68.3 kg)  09/27/18 151 lb (68.5 kg)  09/17/18 157 lb (71.2 kg)  09/16/18 156 lb 12.8 oz (71.1 kg)   MEDICATIONS: Chemo: COMPLETED Per chart- has no relevant medications ordered   LABS:  No labs x2 months  ANTHROPOMETRICS: Height:  Ht Readings from Last 1 Encounters:  03/08/19 '5\' 8"'  (1.727 m)   Weight:  Wt Readings from Last 1 Encounters:  03/22/19 148 lb (67.1 kg)   BMI:  BMI Readings from Last 1 Encounters:  03/22/19 22.50 kg/m   UBW: 182 when presenting In June 2019 IBW: 70 kg Wt changes: (based on last measurement available 3/31) Stable x3 months  Prior to Dec, maintained a wt of 150-152 since start of Oct. Long term, he is down 27.5 lbs x8 months(15.6% bw)-meets mal criteria  Re-Estimated needs:  Energy: 1700-1900 kcals (25-28 kcal/kg bw) Protein: 80-94g Pro (1.2-1.4 g/kg bw) Fluid: >2  L fluid (30 ml/kcal)   NUTRITION DIAGNOSIS:  Inability to eat related to dysphagia/cancer related treaments as evidenced by a literal inability to swallow  DOCUMENTATION CODES:  Not applicable at this time.    INTERVENTION:     Noted pts recent encounter with [facility] radiation via [facility]. Pt has asked several times about what he can put through tube to gain weight. He has been advised that his tube feeding, is the best thing he can put through his tube for weight gain, given its caloric density. However, pt does not seem to want to accept this  Much of conversation consisted of pt listing all the different items he has been putting through his tube and the respective calories/protein the items contain. Every single item patient listed contained either less protein or less kcals than the ensure. RD again educated him that the Ensures will provide the most calories and nutrition. He was sad to hear RD say this. If the variety is really having a psycological benefit, RD is fine with him getting his nutrition from other liquids sources if he makes sure his total intake is ~1800-1900 kcals.   We reviewed how to take the ensure/promod regimen. 5 ensure plus supplements and 30 ml promod qd will provide him: 1850 kcals, 75g Pro and 900 cc free water.  Pt is now >8 months out from chemoradiation. Attempts at TF weaning have not yet begun because he is still unable to functionally swallow, at all. Progress had been hindered by pts depression, financial circumstances and unwillingness to travel very far. Also, the COVID pandemic has put any OP therapy opportunities on hold.   RD stated he would speak with nurse navigator/MD regarding his question about the best nsaid to take. Encouraged compliance with this new regimen.   GOAL:  Patient will meet greater than or equal to 90% of their needs  MET - (with TF and food items via PEG)  MONITOR:  Labs, Weight trends, TF tolerance, I & O's, PO intake, swallow function  Next Visit:  1 month. 6/23  [name] RD, LDN, CNSC Clinical Nutrition Available Tues-Sat via Pager: [phone]  05/17/2019 11:54 AM

## 2019-05-19 ENCOUNTER — Encounter (HOSPITAL_COMMUNITY): Payer: Self-pay | Admitting: *Deleted

## 2019-05-19 NOTE — Progress Notes (Signed)
I attempted to call patient today. I was asked by our nutritionist [name], to call patient as he had questions regarding some pain in his mouth and jaw that is chronic pain but difficult to get under control.  I spoke with the physician before calling patient and he said for patient to continue taking Tylenol and Ibuprofen alternating as needed for pain and discomfort in his mouth and jaw.   I left a detailed message on patient's voicemail and asked that if he has any further questions or concerns to give [place] a call.

## 2019-06-02 ENCOUNTER — Encounter (HOSPITAL_COMMUNITY): Payer: Self-pay

## 2019-06-02 ENCOUNTER — Other Ambulatory Visit: Payer: Self-pay

## 2019-06-02 ENCOUNTER — Inpatient Hospital Stay (HOSPITAL_COMMUNITY): Payer: Medicare Other | Attending: Hematology

## 2019-06-02 DIAGNOSIS — Z923 Personal history of irradiation: Secondary | ICD-10-CM | POA: Diagnosis not present

## 2019-06-02 DIAGNOSIS — C099 Malignant neoplasm of tonsil, unspecified: Secondary | ICD-10-CM | POA: Diagnosis present

## 2019-06-02 MED ORDER — SODIUM CHLORIDE 0.9% FLUSH
10.0000 mL | INTRAVENOUS | Status: DC | PRN
  Administered 2019-06-02: 11:00:00 10 mL via INTRAVENOUS
  Filled 2019-06-02: qty 10

## 2019-06-02 MED ORDER — HEPARIN SOD (PORK) LOCK FLUSH 100 UNIT/ML IV SOLN
500.0000 [IU] | Freq: Once | INTRAVENOUS | Status: AC
  Administered 2019-06-02: 500 [IU] via INTRAVENOUS

## 2019-06-02 NOTE — Progress Notes (Signed)
[name] tolerated portacath flush well without complaints or incident. Port accessed with 20 gauge needle with blood return noted then flushed with 10 ml NS and 5 ml Heparin easily per protocol then de-accessed. VSS Pt discharged self ambulatory in satisfactory condition

## 2019-06-02 NOTE — Patient Instructions (Signed)
[facility] at [facility] Discharge Instructions  Portacath flushed per protocol today. Follow-up as scheduled. Call clinic for any questions or concerns   Thank you for choosing [facility] at [facility] to provide your oncology and hematology care.  To afford each patient quality time with our provider, please arrive at least 15 minutes before your scheduled appointment time.   If you have a lab appointment with the Cancer Center please come in thru the  Main Entrance and check in at the main information desk  You need to re-schedule your appointment should you arrive 10 or more minutes late.  We strive to give you quality time with our providers, and arriving late affects you and other patients whose appointments are after yours.  Also, if you no show three or more times for appointments you may be dismissed from the clinic at the providers discretion.     Again, thank you for choosing [facility].  Our hope is that these requests will decrease the amount of time that you wait before being seen by our physicians.       _____________________________________________________________  Should you have questions after your visit to [facility], please contact our office at [phone] between the hours of 8:00 a.m. and 4:30 p.m.  Voicemails left after 4:00 p.m. will not be returned until the following business day.  For prescription refill requests, have your pharmacy contact our office and allow 72 hours.    Cancer Center Support Programs:   > Cancer Support Group  2nd Tuesday of the month 1pm-2pm, Journey Room   

## 2019-06-14 ENCOUNTER — Encounter (HOSPITAL_COMMUNITY): Payer: Self-pay | Admitting: Dietician

## 2019-06-14 NOTE — Progress Notes (Signed)
Nutrition Follow-up 68 y/o male PMHx HLD, HOH, CKD, w/  little prior medical follow up. Presented to [facility] 06/02/18 after referred by local ENT MD for large L tonsillar mass s/p biopsy. + for SCC.  Begun radiation 7/22 & chemo 7/23. S/P peg placement 8/14 and start of Tube feeding 8/16. Finished radiation 9/12 and chemo 9/25.   Checking in with pt remotely. When last spoke with pt a month ago, he had been  putting numerous miscellaneous foods/drinks through PEG,  largely for QOL purposes. He had received the Ensure from [facility] and RD had reviewed  new regimen:  5 ensure plus supplements and 30 ml promod qd. However, being that he was exhibiting an improved QOL with nutrition from other sources, RD was fine with him exchanging an Ensure for a separate item of similar nutritional value.    RD attempted to reach pt by phone today, but got VM. His inbox was full and RD was unable to leave a message.   Weight wise, when RD spoke with pt a month ago he was weighing himself (w/o clothes) 3x a day. He was ~141 in the mornings and 145-146 in the afternoons. He had reported wt stability.  His wt at his last appt was 148 lbs (weighs with clothes/shoes at appts). At time of this measurement, he had been 147-149 since December, but was still down 27.5 lbs (15.6% bw) since late July 2019.  Wt Readings from Last 10 Encounters:  03/22/19 148 lb (67.1 kg)  03/08/19 147 lb 3.2 oz (66.8 kg)  01/18/19 148 lb 6.4 oz (67.3 kg)  12/07/18 149 lb 11.2 oz (67.9 kg)  10/26/18 152 lb (68.9 kg)  10/19/18 152 lb (68.9 kg)  10/15/18 150 lb 9 oz (68.3 kg)  09/27/18 151 lb (68.5 kg)  09/17/18 157 lb (71.2 kg)  09/16/18 156 lb 12.8 oz (71.1 kg)   MEDICATIONS: Chemo: COMPLETED Per chart- has no relevant medications ordered   LABS:  No labs IN >2 months  ANTHROPOMETRICS: Height:  Ht Readings from Last 1 Encounters:  03/08/19 '5\' 8"'  (1.727 m)   Weight:  Wt Readings from Last 1 Encounters:  03/22/19 148 lb (67.1  kg)   Wt Readings from Last 10 Encounters:  03/22/19 148 lb (67.1 kg)  03/08/19 147 lb 3.2 oz (66.8 kg)  01/18/19 148 lb 6.4 oz (67.3 kg)  12/07/18 149 lb 11.2 oz (67.9 kg)  10/26/18 152 lb (68.9 kg)  10/19/18 152 lb (68.9 kg)  10/15/18 150 lb 9 oz (68.3 kg)  09/27/18 151 lb (68.5 kg)  09/17/18 157 lb (71.2 kg)  09/16/18 156 lb 12.8 oz (71.1 kg)   BMI:  BMI Readings from Last 1 Encounters:  03/22/19 22.50 kg/m   UBW: 182 when presenting In June 2019 IBW: 70 kg Wt changes:  At time of last available weight measurement (148 on 3/31), he had been stable x3 months.   Re-Estimated needs:  Energy: 1700-1900 kcals (25-28 kcal/kg bw) Protein: 80-94g Pro (1.2-1.4 g/kg bw) Fluid: >2  L fluid (30 ml/kcal)   NUTRITION DIAGNOSIS:  Inability to eat related to dysphagia/cancer related treaments as evidenced by a literal inability to swallow  DOCUMENTATION CODES:  Not applicable at this time.   INTERVENTION:      As noted above, RD attempted to reach pt via phone today but was unsuccessful. RD unable to leave message d/t VM being full.   Patient has been stable for many months now and RD has been checking  in with patient on a monthly basis to see how he is faring with his PEG, mainly for moral support, as he has very little in way of social support outside of cancer center staff. This has been worsened d/t COVID and temporary suspension of [facility] [place] activities.   Will follow up as schedule allows, but will schedule for definitive f/u appt x1 month.   GOAL:  Patient will meet greater than or equal to 90% of their needs  -Unknown if met  MONITOR:  Labs, Weight trends, TF tolerance, I & O's, PO intake, swallow function  Next Visit:   7/21 or as schedule allows  [name] RD, LDN, CNSC Clinical Nutrition Available Tues-Sat via Pager: [phone]  06/14/2019 3:50 PM

## 2019-07-08 ENCOUNTER — Encounter: Payer: Self-pay | Admitting: Dietician

## 2019-07-08 NOTE — Progress Notes (Signed)
Nutrition Follow-up 68 y/o male PMHx HLD, HOH, CKD, w/  little prior medical follow up. Presented to [facility] 06/02/18 after referred by local ENT MD for large L tonsillar mass s/p biopsy. + for SCC.  Begun radiation 7/22 & chemo 7/23. S/P peg placement 8/14 and start of Tube feeding 8/16. Finished radiation 9/12 and chemo 9/25.   Checking in with pt remotely. He reports he is still putting various different commercial beverages through his tube- Naked juice, nestle choc/straw milk, etc. He says he is putting 4 Ensure Plus's through his tube, and instead of the 5th Ensure he will put these miscellaneous drinks through the tube. He did not mention the promod.    He says since we last spoke his weight had gotten as low to 136 lbs? He weighs himself this morning and his scale says 144.6 lbs. He reports his weight varies often.   He is still not able to swallow at all. He is interested in restarting ST therapy. He feels he would do better with regular neck massages.   He saw the radiation oncologist yesterday. He reads his instructions from his visit. He will get a repeat PET/CT in 2 months. He is being referred to physical Therapy team for "neck thickness" and to ST "for pain with his swallow exercises". He says he cant turn his head very well and he thinks it is getting worse. He also thinks his trismus is getting a little worse. His neck is very tender/sore.  He reports compliance with the ST exercises.  In past, [facility] SLP has recommended patient be referred to [facility] for dilation of UES.   He voices having a lot of financial troubles. He is dealing with collection agencies. He says an [facility] NP -named Dominica Severin (sp.) came by yesterday and discussed avenues for financial help. He was recommended to apply for medicare/medicaid, but he says he not eligible.   He still reports dealing with onoging depression. He intermittently crys over his current health concerns/lack of progress.   Wt Readings  from Last 10 Encounters:  03/22/19 67.1 kg  03/08/19 66.8 kg  01/18/19 67.3 kg  12/07/18 67.9 kg  10/26/18 68.9 kg  10/19/18 68.9 kg  10/15/18 68.3 kg  09/27/18 68.5 kg  09/17/18 71.2 kg  09/16/18 71.1 kg   MEDICATIONS: Chemo: COMPLETED Per chart- has no relevant medications ordered   LABS:   No new labs  ANTHROPOMETRICS: Height:  Ht Readings from Last 1 Encounters:  03/08/19 '5\' 8"'  (1.727 m)   Weight:  Wt Readings from Last 1 Encounters:  03/22/19 67.1 kg   Wt Readings from Last 10 Encounters:  03/22/19 67.1 kg  03/08/19 66.8 kg  01/18/19 67.3 kg  12/07/18 67.9 kg  10/26/18 68.9 kg  10/19/18 68.9 kg  10/15/18 68.3 kg  09/27/18 68.5 kg  09/17/18 71.2 kg  09/16/18 71.1 kg   BMI:  BMI Readings from Last 1 Encounters:  03/22/19 22.50 kg/m   UBW: 182 when presenting In June 2019 IBW: 70 kg Wt changes:  At time of last available weight measurement (148 on 3/31), he had been stable x3 months.   Re-Estimated needs:  Energy: 1700-1900 kcals (25-28 kcal/kg bw) Protein: 80-94g Pro (1.2-1.4 g/kg bw) Fluid: >2  L fluid (30 ml/kcal)   NUTRITION DIAGNOSIS:  Inability to eat related to dysphagia/cancer related treaments as evidenced by a literal inability to swallow  DOCUMENTATION CODES:  Not applicable at this time.   INTERVENTION:  RD provided 30+ minutes supportive listening. RD passed on above information to [facility] nurse navigator.   Attempts at TF weaning have not yet begun because he is still unable to functionally swallow, at all. As of today, pts current PEG feeds:  4 bottles of Ensure Plus: 1400 kcals, 64 kcals.   He supplements this regimen with various high kcal/high pro beverages. He read the nutrition information of several products he uses and they all averaged 250-300 kcals and 15-20g Pro. Based on his intake history and reported weight, he is believed to be meeting his estimated needs.   RD told pt he will be seeing other [facility] RD going  forward, d/t this RD's departure.   GOAL:  Patient will meet greater than or equal to 90% of their needs   -Believed met  MONITOR:  Labs, Weight trends, TF tolerance, I & O's, PO intake, swallow function  Next Visit:  8/21 - with Joli.   [name] RD, LDN, CNSC Clinical Nutrition Available Tues-Sat via Pager: [phone]  07/08/2019 9:17 AM

## 2019-07-22 ENCOUNTER — Other Ambulatory Visit (HOSPITAL_COMMUNITY): Payer: Self-pay

## 2019-07-22 ENCOUNTER — Ambulatory Visit (HOSPITAL_COMMUNITY): Payer: Self-pay | Admitting: Hematology

## 2019-07-25 ENCOUNTER — Ambulatory Visit (HOSPITAL_COMMUNITY): Payer: Medicare Other | Admitting: Hematology

## 2019-07-25 ENCOUNTER — Other Ambulatory Visit (HOSPITAL_COMMUNITY): Payer: Medicare Other

## 2019-07-27 ENCOUNTER — Encounter: Payer: Self-pay | Admitting: General Practice

## 2019-07-27 NOTE — Progress Notes (Signed)
[facility] CSW Progress Notes  Call to patient to request permission to refer to [place] food distribution program, left VM, awaiting return call. Patient may want only essential items, not food. Agency can accommodate this request.   Edwyna Shell, LCSW Clinical Social Worker Phone:  [phone]

## 2019-08-12 ENCOUNTER — Telehealth: Payer: Self-pay

## 2019-08-12 NOTE — Telephone Encounter (Signed)
Nutrition Follow-up:  Patient with large left tonsillar mass, SCC.  Peg tube placed on 8/14.    Spoke with patient via phone for nutrition follow-up.  Patient reports that he gives mostly 2 ensure plus in tube in the am and mostly 2 ensure plus in tube in the pm.  Also gives naked juice and other beverages via tube during the day (230-300 calories, 15-20 g protein).    Reports that he is participating in physical therapy for neck issues and SLP.    Patient voices issues with finances and requesting more ensure from [facility].    Noted patient missed follow-up appointment on 8/3  Medications: reviewed  Labs: no new  Anthropometrics:   Weight per his scales at home 149-150 lb  Last weight in cancer center 3/31 147 lb   Estimated Energy Needs  Kcals: 1700-1900 Protein: 80-94 g Fluid: >2 L  NUTRITION DIAGNOSIS: Inability to eat continues relying on feeding tube.     INTERVENTION:  Attempting tube feeding weaning not possible at this time as patient unable to tolerate solid foods.  Patient to continue ensure plus 4 : 1400 calories 64 g protein.  With additional beverages he is believed to be meeting nutritional needs.  Weight is increasing per his report.  Patient asking for [facility] to call him on Monday to set up missed appointment.  Will send inbasket message to Amy.   Patient has contact information    MONITORING, EVALUATION, GOAL: weight, TF tolerance, po intake, swallow function   NEXT VISIT: phone f/u in about 4 weeks  Jaculin Rasmus B. Zenia Resides, [place], [place] Registered Dietitian [phone] (pager)

## 2019-08-25 ENCOUNTER — Inpatient Hospital Stay (HOSPITAL_COMMUNITY): Payer: Medicare Other

## 2019-08-25 ENCOUNTER — Other Ambulatory Visit: Payer: Self-pay

## 2019-08-25 ENCOUNTER — Encounter (HOSPITAL_COMMUNITY): Payer: Self-pay | Admitting: Hematology

## 2019-08-25 ENCOUNTER — Inpatient Hospital Stay (HOSPITAL_COMMUNITY): Payer: Medicare Other | Attending: Hematology | Admitting: Hematology

## 2019-08-25 VITALS — BP 122/48 | HR 78 | Temp 98.4°F | Resp 16 | Wt 152.1 lb

## 2019-08-25 DIAGNOSIS — C099 Malignant neoplasm of tonsil, unspecified: Secondary | ICD-10-CM

## 2019-08-25 DIAGNOSIS — E039 Hypothyroidism, unspecified: Secondary | ICD-10-CM | POA: Insufficient documentation

## 2019-08-25 DIAGNOSIS — Z933 Colostomy status: Secondary | ICD-10-CM | POA: Diagnosis not present

## 2019-08-25 DIAGNOSIS — Z87891 Personal history of nicotine dependence: Secondary | ICD-10-CM | POA: Diagnosis not present

## 2019-08-25 DIAGNOSIS — Z79899 Other long term (current) drug therapy: Secondary | ICD-10-CM | POA: Insufficient documentation

## 2019-08-25 DIAGNOSIS — C01 Malignant neoplasm of base of tongue: Secondary | ICD-10-CM | POA: Diagnosis present

## 2019-08-25 DIAGNOSIS — E785 Hyperlipidemia, unspecified: Secondary | ICD-10-CM | POA: Insufficient documentation

## 2019-08-25 DIAGNOSIS — Z9221 Personal history of antineoplastic chemotherapy: Secondary | ICD-10-CM | POA: Diagnosis not present

## 2019-08-25 LAB — COMPREHENSIVE METABOLIC PANEL
ALT: 18 U/L (ref 0–44)
AST: 22 U/L (ref 15–41)
Albumin: 3.8 g/dL (ref 3.5–5.0)
Alkaline Phosphatase: 55 U/L (ref 38–126)
Anion gap: 9 (ref 5–15)
BUN: 27 mg/dL — ABNORMAL HIGH (ref 8–23)
CO2: 28 mmol/L (ref 22–32)
Calcium: 9.2 mg/dL (ref 8.9–10.3)
Chloride: 101 mmol/L (ref 98–111)
Creatinine, Ser: 0.8 mg/dL (ref 0.61–1.24)
GFR calc Af Amer: >60 mL/min (ref >60)
GFR calc non Af Amer: >60 mL/min (ref >60)
Glucose, Bld: 112 mg/dL — ABNORMAL HIGH (ref 70–99)
Potassium: 4.2 mmol/L (ref 3.5–5.1)
Sodium: 138 mmol/L (ref 135–145)
Total Bilirubin: 0.8 mg/dL (ref 0.3–1.2)
Total Protein: 7 g/dL (ref 6.5–8.1)

## 2019-08-25 LAB — CBC WITH DIFFERENTIAL/PLATELET
Abs Immature Granulocytes: 0 10*3/uL (ref 0.00–0.07)
Basophils Absolute: 0 10*3/uL (ref 0.0–0.1)
Basophils Relative: 1 %
Eosinophils Absolute: 0.1 10*3/uL (ref 0.0–0.5)
Eosinophils Relative: 2 %
HCT: 40 % (ref 39.0–52.0)
Hemoglobin: 12.6 g/dL — ABNORMAL LOW (ref 13.0–17.0)
Immature Granulocytes: 0 %
Lymphocytes Relative: 24 %
Lymphs Abs: 0.5 10*3/uL — ABNORMAL LOW (ref 0.7–4.0)
MCH: 31.2 pg (ref 26.0–34.0)
MCHC: 31.5 g/dL (ref 30.0–36.0)
MCV: 99 fL (ref 80.0–100.0)
Monocytes Absolute: 0.3 10*3/uL (ref 0.1–1.0)
Monocytes Relative: 15 %
Neutro Abs: 1.2 10*3/uL — ABNORMAL LOW (ref 1.7–7.7)
Neutrophils Relative %: 58 %
Platelets: 155 10*3/uL (ref 150–400)
RBC: 4.04 MIL/uL — ABNORMAL LOW (ref 4.22–5.81)
RDW: 13.8 % (ref 11.5–15.5)
WBC: 2.1 10*3/uL — ABNORMAL LOW (ref 4.0–10.5)
nRBC: 0 % (ref 0.0–0.2)

## 2019-08-25 LAB — TSH: TSH: 11.878 u[IU]/mL — ABNORMAL HIGH (ref 0.350–4.500)

## 2019-08-25 MED ORDER — LEVOTHYROXINE SODIUM 25 MCG PO TABS: 25.0000 ug | ORAL_TABLET | Freq: Every day | ORAL | 5 refills | Status: DC

## 2019-08-25 NOTE — Patient Instructions (Addendum)
[place] at [facility] Discharge Instructions  You were seen today by Dr. Delton Coombes. He went over your recent lab results. He will refer you back to Dr. Arnoldo Morale to evaluate your tube. He will schedule you for a CT of your neck. He will send you in a medication for your Thyroid levels. He will see you back in 6 months for labs and follow up.  Please take the thyroid medication at least an hour before eating and take it by itself.  Thank you for choosing [place] at [facility] to provide your oncology and hematology care.  To afford each patient quality time with our provider, please arrive at least 15 minutes before your scheduled appointment time.   If you have a lab appointment with the [facility] please come in thru the  Main Entrance and check in at the main information desk  You need to re-schedule your appointment should you arrive 10 or more minutes late.  We strive to give you quality time with our providers, and arriving late affects you and other patients whose appointments are after yours.  Also, if you no show three or more times for appointments you may be dismissed from the clinic at the providers discretion.     Again, thank you for choosing [facility].  Our hope is that these requests will decrease the amount of time that you wait before being seen by our physicians.       _____________________________________________________________  Should you have questions after your visit to [facility], please contact our office at [phone] between the hours of 8:00 a.m. and 4:30 p.m.  Voicemails left after 4:00 p.m. will not be returned until the following business day.  For prescription refill requests, have your pharmacy contact our office and allow 72 hours.    Cancer Center Support Programs:   > Cancer Support Group  2nd Tuesday of the month 1pm-2pm, Journey Room

## 2019-08-25 NOTE — Assessment & Plan Note (Signed)
1.  Left tonsil squamous cell carcinoma, stage IVa (T3 N2 M0): - 3 cycles of carboplatin and 5-FU with radiation from 07/13/2018 through 09/13/2018. -Posttreatment PET on 02/07/2019 showed resolution of the left tonsillar mass and neck adenopathy with no current findings of active malignancy. - Physical exam today did not reveal any lymphadenopathy in the neck.  No masses in the oral cavity. 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  He also has trismus. - I plan to see him back in 6 months for follow-up.  We will have a CT scan of the neck at that time.  2.  Nutrition: -He is still continuing to use PEG tube with 2 cans of Osmolite twice daily.  He is also drinking 1 can of Ensure. - His PEG tube has greenish moldly appearance.  We will refer him to Dr. Arnoldo Morale.  3.  Hypothyroidism: - His TSH increased to 11.8. -We will start him on Synthroid 25 mcg daily.

## 2019-08-25 NOTE — Progress Notes (Signed)
[address] [place], [place] [address]   CLINIC:  Medical Oncology/Hematology  PCP:  [name], DO [place] [address] [phone]   REASON FOR VISIT:  Follow-up for tonsillar cancer  CURRENT THERAPY:Observation per NCCN guidelines   BRIEF ONCOLOGIC HISTORY:  Oncology History  Tonsillar cancer ([place])  06/02/2018 Initial Diagnosis   Tonsillar cancer ([place])   06/15/2018 -  Chemotherapy   The patient had palonosetron (ALOXI) injection 0.25 mg, 0.25 mg, Intravenous,  Once, 3 of 3 cycles Administration: 0.25 mg (07/13/2018), 0.25 mg (07/15/2018), 0.25 mg (08/10/2018), 0.25 mg (09/13/2018) CARBOplatin (PARAPLATIN) 140 mg in sodium chloride 0.9 % 100 mL chemo infusion, 70 mg/m2 = 140 mg (100 % of original dose 70 mg/m2), Intravenous,  Once, 3 of 3 cycles Dose modification: 70 mg/m2 (original dose 70 mg/m2, Cycle 1, Reason: Other (see comments), Comment: french protocol head and neck), 52.5 mg/m2 (75 % of original dose 70 mg/m2, Cycle 3, Reason: Provider Judgment) Administration: 140 mg (07/13/2018), 140 mg (07/14/2018), 140 mg (07/15/2018), 140 mg (07/19/2018), 140 mg (08/10/2018), 140 mg (08/11/2018), 140 mg (08/12/2018), 140 mg (08/13/2018), 140 mg (09/13/2018), 140 mg (09/14/2018), 100 mg (09/15/2018), 100 mg (09/16/2018) fluorouracil (ADRUCIL) 4,750 mg in sodium chloride 0.9 % 55 mL chemo infusion, 600 mg/m2/day = 4,750 mg (100 % of original dose 600 mg/m2/day), Intravenous, 4D (96 hours ), 3 of 3 cycles Dose modification: 600 mg/m2/day (original dose 600 mg/m2/day, Cycle 1, Reason: Other (see comments), Comment: french protocol head and neck) Administration: 4,750 mg (07/13/2018), 4,750 mg (08/10/2018), 4,750 mg (09/13/2018)  for chemotherapy treatment.       CANCER STAGING: Cancer Staging Tonsillar cancer ([place]) Staging form: Pharynx - HPV-Mediated Oropharynx, AJCC 8th Edition - Clinical stage from 06/02/2018: Stage II (cT3, cN2, cM0) - Unsigned     INTERVAL HISTORY:  Mr. Oscar 68 y.o. male seen for follow-up of tonsil cancer.  Still cannot eat by mouth.  He is using PEG tube with 2 cans of Osmolite twice daily.  Drinking 1 can of Ensure.  Also undergoing physical therapy for his neck fibrosis.  Fatigue has been stable.  Dizziness at times is also stable.  Trouble swallowing is stable.  Occasional loose bowel movements present.  Denies any weight loss.  No fevers or chills reported.  No tingling or numbness in extremities.    REVIEW OF SYSTEMS:  Review of Systems  Gastrointestinal: Positive for diarrhea.  Neurological: Positive for dizziness.  All other systems reviewed and are negative.    PAST MEDICAL/SURGICAL HISTORY:  Past Medical History:  Diagnosis Date  . HOH (hard of hearing)   . Hyperlipidemia   . Mass of oral cavity 05/12/2018  . Positive FIT (fecal immunochemical test) 05/12/2018  . PTSD (post-traumatic stress disorder)    has not been offically diagnosised  . Tonsillar cancer ([place]) 06/02/2018   Past Surgical History:  Procedure Laterality Date  . APPENDECTOMY  1969  . COLONOSCOPY W/ POLYPECTOMY     remote past  . ESOPHAGOGASTRODUODENOSCOPY  08/04/2018   Dr. Arnoldo Morale: erythema of larynx, normal stomach, normal duodenum. PEG Placed 20 F, bumper at 2.5 cm  . ESOPHAGOGASTRODUODENOSCOPY (EGD) WITH PROPOFOL N/A 08/04/2018   Procedure: ESOPHAGOGASTRODUODENOSCOPY (EGD) WITH PROPOFOL;  Surgeon: Aviva Signs, MD;  [facility];  Service: Gastroenterology;  Laterality: N/A;  . FINGER FRACTURE SURGERY Left   . MULTIPLE EXTRACTIONS WITH ALVEOLOPLASTY N/A 06/15/2018   Procedure: Extraction of tooth #'s 2,12,13,18,20,and 21 with alveoloplasty and  gross debridement of remaining teeth;  Surgeon: Lenn Cal, DDS;  Location: [facility];  Service: Oral Surgery;  Laterality: N/A;  . PEG PLACEMENT N/A 08/04/2018   Procedure: PERCUTANEOUS ENDOSCOPIC GASTROSTOMY (PEG) PLACEMENT;  Surgeon: Aviva Signs, MD;  Location: [facility];  Service: Gastroenterology;  Laterality: N/A;  . PORTACATH PLACEMENT Left 07/02/2018   Procedure: INSERTION PORT-A-CATH;  Surgeon: Aviva Signs, MD;  Location: [facility];  Service: General;  Laterality: Left;     SOCIAL HISTORY:  Social History   Socioeconomic History  . Marital status: Divorced    Spouse name: Not on file  . Number of children: Not on file  . Years of education: Not on file  . Highest education level: Not on file  Occupational History  . Occupation: English as a second language teacher, retired  Scientific laboratory technician  . Financial resource strain: Not on file  . Food insecurity    Worry: Not on file    Inability: Not on file  . Transportation needs    Medical: Not on file    Non-medical: Not on file  Tobacco Use  . Smoking status: Former Smoker    Years: 15.00    Types: Cigarettes    Quit date: 2000    Years since quitting: 20.6  . Smokeless tobacco: Never Used  Substance and Sexual Activity  . Alcohol use: Not Currently    Alcohol/week: 12.0 standard drinks    Types: 12 Cans of beer per week  . Drug use: Never  . Sexual activity: Not Currently  Lifestyle  . Physical activity    Days per week: Not on file    Minutes per session: Not on file  . Stress: Not on file  Relationships  . Social Herbalist on phone: Not on file    Gets together: Not on file    Attends religious service: Not on file    Active member of club or organization: Not on file    Attends meetings of clubs or organizations: Not on file    Relationship status: Not on file  . Intimate partner violence    Fear of current or ex partner: Not on file    Emotionally abused: Not on file    Physically abused: Not on file    Forced sexual activity: Not on file  Other Topics Concern  . Not on file  Social History Narrative   Mr. Septer is a pleasant gentleman who is a retired English as a second language teacher.  He lives independently with his dog here in [place].  He formally lived in [place].    FAMILY HISTORY:  Family  History  Problem Relation Age of Onset  . Diabetes Father        died at age 4   . Colon cancer Neg Hx     CURRENT MEDICATIONS:  Outpatient Encounter Medications as of 08/25/2019  Medication Sig  . Ensure Plus (ENSURE PLUS) LIQD Give via PEG. 2 bottles in morning. 2 in afternoon. 1 before bed.  Marland Kitchen ibuprofen (ADVIL,MOTRIN) 200 MG tablet Take 400 mg by mouth every 6 (six) hours as needed for moderate pain.   Marland Kitchen levothyroxine (SYNTHROID) 25 MCG tablet Take 1 tablet (25 mcg total) by mouth daily before breakfast.  . Nutritional Supplements (PROMOD) LIQD 30 mls (1 oz) via PEG. 1x/day  . [DISCONTINUED] Amino Acids-Protein Hydrolys (FEEDING SUPPLEMENT, PRO-STAT SUGAR FREE 64,) LIQD Place 30 mLs into feeding tube daily. (Patient not taking: Reported on 08/25/2019)   No facility-administered encounter medications on file  as of 08/25/2019.     ALLERGIES:  No Known Allergies   PHYSICAL EXAM:  ECOG Performance status: 1  Vitals:   08/25/19 1037  BP: (!) 122/48  Pulse: 78  Resp: 16  Temp: 98.4 F (36.9 C)  SpO2: 99%   Filed Weights   08/25/19 1037  Weight: 152 lb 1.6 oz (69 kg)    Physical Exam Constitutional:      Appearance: Normal appearance.  Neck:     Comments: No oropharyngeal lesions.  Trismus present.  No palpable adenopathy. Cardiovascular:     Rate and Rhythm: Normal rate and regular rhythm.  Pulmonary:     Breath sounds: Normal breath sounds.  Abdominal:     General: Abdomen is flat. There is no distension.     Palpations: Abdomen is soft. There is no mass.  Musculoskeletal:        General: No swelling.  Skin:    General: Skin is warm.  Neurological:     General: No focal deficit present.     Mental Status: He is alert and oriented to person, place, and time.  Psychiatric:        Mood and Affect: Mood normal.        Behavior: Behavior normal.      LABORATORY DATA:  I have reviewed the labs as listed.  CBC    Component Value Date/Time   WBC 2.1 (L)  08/25/2019 1007   RBC 4.04 (L) 08/25/2019 1007   HGB 12.6 (L) 08/25/2019 1007   HGB 14.2 05/12/2018 0949   HCT 40.0 08/25/2019 1007   HCT 42.0 05/12/2018 0949   PLT 155 08/25/2019 1007   PLT 256 05/12/2018 0949   MCV 99.0 08/25/2019 1007   MCV 87 05/12/2018 0949   MCH 31.2 08/25/2019 1007   MCHC 31.5 08/25/2019 1007   RDW 13.8 08/25/2019 1007   RDW 13.0 05/12/2018 0949   LYMPHSABS 0.5 (L) 08/25/2019 1007   LYMPHSABS 1.1 05/12/2018 0949   MONOABS 0.3 08/25/2019 1007   EOSABS 0.1 08/25/2019 1007   EOSABS 0.1 05/12/2018 0949   BASOSABS 0.0 08/25/2019 1007   BASOSABS 0.0 05/12/2018 0949   CMP Latest Ref Rng & Units 08/25/2019 03/22/2019 01/11/2019  Glucose 70 - 99 mg/dL 112(H) 104(H) 108(H)  BUN 8 - 23 mg/dL 27(H) 19 22  Creatinine 0.61 - 1.24 mg/dL 0.80 0.87 0.80  Sodium 135 - 145 mmol/L 138 139 138  Potassium 3.5 - 5.1 mmol/L 4.2 4.6 3.4(L)  Chloride 98 - 111 mmol/L 101 102 97(L)  CO2 22 - 32 mmol/L 28 29 31   Calcium 8.9 - 10.3 mg/dL 9.2 9.2 9.4  Total Protein 6.5 - 8.1 g/dL 7.0 6.9 6.9  Total Bilirubin 0.3 - 1.2 mg/dL 0.8 0.7 0.5  Alkaline Phos 38 - 126 U/L 55 69 64  AST 15 - 41 U/L 22 24 18   ALT 0 - 44 U/L 18 18 20        DIAGNOSTIC IMAGING:  I have independently reviewed the scans and discussed with the patient.   I have reviewed Venita Lick LPN's note and agree with the documentation.  I personally performed a face-to-face visit, made revisions and my assessment and plan is as follows.    ASSESSMENT & PLAN:   Tonsillar cancer ([place]) 1.  Left tonsil squamous cell carcinoma, stage IVa (T3 N2 M0): - 3 cycles of carboplatin and 5-FU with radiation from 07/13/2018 through 09/13/2018. -Posttreatment PET on 02/07/2019 showed resolution of the left tonsillar mass and neck  adenopathy with no current findings of active malignancy. - Physical exam today did not reveal any lymphadenopathy in the neck.  No masses in the oral cavity. - He is undergoing therapy for fibrosis in the  neck region.  He also has trismus. - I plan to see him back in 6 months for follow-up.  We will have a CT scan of the neck at that time.  2.  Nutrition: -He is still continuing to use PEG tube with 2 cans of Osmolite twice daily.  He is also drinking 1 can of Ensure. - His PEG tube has greenish moldly appearance.  We will refer him to Dr. Arnoldo Morale.  3.  Hypothyroidism: - His TSH increased to 11.8. -We will start him on Synthroid 25 mcg daily.   Total time spent is 25 minutes with more than 50% of time spent face-to-face discussing chemo radio therapy complications, counseling and coordination of care.    Orders placed this encounter:  Orders Placed This Encounter  Procedures  . CT SOFT TISSUE NECK W CONTRAST      Derek Jack, [place] [phone]

## 2019-09-13 ENCOUNTER — Ambulatory Visit (INDEPENDENT_AMBULATORY_CARE_PROVIDER_SITE_OTHER): Payer: Medicare Other | Admitting: General Surgery

## 2019-09-13 ENCOUNTER — Encounter: Payer: Self-pay | Admitting: General Surgery

## 2019-09-13 ENCOUNTER — Other Ambulatory Visit: Payer: Self-pay

## 2019-09-13 VITALS — BP 110/64 | HR 79 | Temp 98.2°F | Resp 16 | Ht 67.0 in | Wt 154.0 lb

## 2019-09-13 DIAGNOSIS — C099 Malignant neoplasm of tonsil, unspecified: Secondary | ICD-10-CM | POA: Diagnosis not present

## 2019-09-13 NOTE — Patient Instructions (Signed)
PEG Tube Home Guide  A percutaneous endoscopic gastrostomy (PEG) tube is used to deliver food and fluids directly into the stomach. The tube has a clamp, a cap, and two anchors (bolsters). One bolster keeps the tube from coming out of the stomach. The other bolster holds the tube against the abdomen. You will be taught how to use and adjust your PEG tube before you leave the hospital. You will also be taught how to care for the opening (stoma) in your abdomen. Make sure that you understand:  How to care for your PEG tube.  How to care for your stoma.  How to give yourself feedings and medicines.  When to call your health care provider for help. Supplies needed:  Soapy water.  Clean, plain water.  Clean washcloth.  Bandage (dressing). This is optional.  Syringe. How to care for a PEG tube Check your PEG tube every day. Make sure:  It is not too tight. The bolster should rest gently over the stoma.  It is in the correct position. There is a Jorge Retz on the tube that shows when it is in the correct position. Adjust the tube if you need to. Cleaning your stoma Clean your stoma every day. Follow these steps: 1. Wash your hands with soap and water. If soap and water are not available, use hand sanitizer. 2. Check the skin around the stoma for redness, rash, swelling, drainage, or extra tissue growth. If you notice any of these, call your health care provider. 3. Wash the stoma and the skin around it using a clean, soft washcloth. Clean using a circular motion, and wipe away from the stoma opening, not toward it. ? Use warm, soapy water, and only use cleansers recommended by your health care provider. ? Rinse the stoma area with plain water. ? Pat the stoma area dry. 4. Place a dressing over the stoma if your health care provider told you to do that.  Giving a feeding Your health care provider will give you instructions about:  How much nutrition and fluid you will need for each  feeding.  How often to have a feeding.  Whether to take medicine in the tube by itself or with a feeding. To give yourself a feeding, follow these steps: 1. Lay out all of the equipment that you will need. 2. Make sure that the nutritional formula is at room temperature. 3. Wash your hands with soap and water. 4. Position yourself so that you are upright. You will need to stay upright throughout the feeding and for at least 30 minutes after the feeding. 5. Make sure the syringe plunger is pushed in. Place the tip of the syringe in clean water, and slowly pull the plunger to bring (draw up) the water into the syringe. 6. Remove the clamp and the cap from the PEG tube. 7. Push the water out of the syringe to clean (flush) the tube. 8. If the tube is clear, draw up the formula into the syringe. Make sure to use the right amount for each feeding and add water if necessary. 9. Slowly push the formula from the syringe through the tube. 10. After the feeding, flush the tube with water. 11. Put the clamp and the cap on the tube. Giving medicine To give yourself medicine, follow these steps: 1. Lay out all of the equipment that you will need. 2. If your medicine is in tablet form, crush the tablet and dissolve it in water. 3. Wash your hands with soap   and water. 4. Position yourself so that you are upright. You will need to stay upright while you give yourself medicine and for at least 30 minutes afterward. 5. Make sure the syringe plunger is pushed in. Place the tip of the syringe in clean water, and slowly pull the plunger to bring (draw up) the water into the syringe. 6. Remove the clamp and the cap from the PEG tube. 7. Push the water out of the syringe to clean (flush) the tube. 8. If the tube is clear, draw up the medicine into the syringe. 9. Slowly push the medicine from the syringe through the tube. 10. Flush the tube with water. 11. Put the clamp and the cap on the tube. Do not take  sustained release (SR) medicines through your tube. If you are unsure if your medicine is an SR medicine, ask your health care provider or pharmacist. Contact a health care provider if you have:  Soreness, redness, or irritation around your stoma.  Abdominal pain or bloating during or after your feedings.  Nausea, constipation, or diarrhea that will not go away.  A fever.  Problems with your PEG tube. Get help right away if:  Your tube is blocked.  Your tube falls out.  You have pain around your stoma.  You are bleeding from your stoma.  Your tube is leaking.  You choke or you have trouble breathing during or after a feeding. Summary  A percutaneous endoscopic gastrostomy (PEG) tube is used to deliver food and fluids directly into the stomach.  You will be taught how to use and adjust your PEG tube. You will also be taught how to care for the stoma in your abdomen.  Your health care provider will give you instructions on how to give yourself nutritional formula and medicines through your PEG tube.  Contact your health care provider if you have a fever or soreness, redness, or irritation around your stoma.  Get help right away if your tube leaks, is blocked, or falls out. Get help right away if you have pain or bleeding around your stoma. This information is not intended to replace advice given to you by your health care provider. Make sure you discuss any questions you have with your health care provider. Document Released: 04/24/2015 Document Revised: 02/24/2019 Document Reviewed: 12/21/2017 Elsevier Patient Education  2020 [employer].

## 2019-09-13 NOTE — Progress Notes (Signed)
Subjective:     [name]  Patient is here concerning his gastrostomy tube.  He does have some greenish-black spots along the portion of the tube.  He occasionally states that some of the fluid can somewhat be hard to push in.  No significant leakage around the PEG site.  Is been 1 year since the gastrostomy tube was placed. Objective:    BP 110/64 (BP Location: Left Arm, Patient Position: Sitting, Cuff Size: Normal)   Pulse 79   Temp 98.2 F (36.8 C) (Tympanic)   Resp 16   Ht 5\' 7"  (1.702 m)   Wt 154 lb (69.9 kg)   SpO2 97%   BMI 24.12 kg/m   General:  alert, cooperative and no distress  PEG tube site clean and dry.  There is greenish-black spots noted along approximately the distal two thirds of the gastrostomy tube.  I like to do remove this section of the gastrostomy tube and replaced the cap.     Assessment:    Gastrostomy tube dysfunction, resolved    Plan:   Patient was instructed to flush the gastrostomy tube after placing nutritional feeding preparations with water.  He was instructed to call me should any further problems arise.

## 2019-09-22 ENCOUNTER — Other Ambulatory Visit (HOSPITAL_COMMUNITY): Payer: Self-pay | Admitting: Radiation Oncology

## 2019-09-22 DIAGNOSIS — C099 Malignant neoplasm of tonsil, unspecified: Secondary | ICD-10-CM

## 2019-10-03 ENCOUNTER — Encounter (HOSPITAL_COMMUNITY): Payer: Self-pay

## 2019-10-03 ENCOUNTER — Encounter (HOSPITAL_COMMUNITY): Admission: RE | Admit: 2019-10-03 | Payer: Medicare Other | Source: Ambulatory Visit

## 2019-10-05 ENCOUNTER — Encounter: Payer: Self-pay | Admitting: Internal Medicine

## 2019-10-27 ENCOUNTER — Encounter: Payer: Self-pay | Admitting: Nurse Practitioner

## 2019-10-27 ENCOUNTER — Ambulatory Visit: Payer: Medicare Other | Admitting: Nurse Practitioner

## 2019-10-27 ENCOUNTER — Other Ambulatory Visit: Payer: Self-pay

## 2019-10-27 VITALS — BP 110/56 | HR 77 | Temp 97.3°F | Ht 68.0 in | Wt 150.0 lb

## 2019-10-27 DIAGNOSIS — R1310 Dysphagia, unspecified: Secondary | ICD-10-CM | POA: Diagnosis not present

## 2019-10-27 DIAGNOSIS — R195 Other fecal abnormalities: Secondary | ICD-10-CM

## 2019-10-27 DIAGNOSIS — R1319 Other dysphagia: Secondary | ICD-10-CM

## 2019-10-27 NOTE — Progress Notes (Signed)
Referring Provider: [name], DO Primary Care Physician:  [name], DO Primary GI:  Dr. Gala Romney  Chief Complaint  Patient presents with  . 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 Has PEG currently. reports he is not able to swallow anything    HPI:   [name] is a 68 y.o. male who presents with complaints of dysphagia.  The patient was last seen in our office 03/08/2019 for positive fecal immunochemical test.  History of heme positive stool and requested postponement of colonoscopy until March/April 2020.  Diagnosed with tonsillar cancer in June 2019 with weight loss and severe mucositis while undergoing treatment that unfortunately required a PEG tube placement in August 2019.  Last colonoscopy 15 years ago.  At his last visit it was noted he finished chemoradiation in September/October 2019 but still with some dysphagia symptoms that are slowly improving.  Tube feeds per PEG.  No other overt GI complaints.  Was wanting to postpone colonoscopy due to many bills currently.  Recommended calling our office when he is ready to proceed with colonoscopy, follow-up as needed.  The patient was last seen by oncology 08/25/2019 for tonsillar cancer.  Noted still cannot eat by mouth and using PEG tube with 2 cans of Osmolite twice daily, drinking 1 can of Ensure.  Undergoing PT for neck fibrosis.  Fatigue stabilized.  Dizziness also stabilized.  Trouble swallowing is stabilized.  PET scan on 02/07/2019 showed resolution of the left tonsillar mass and neck adenopathy with no current findings of active malignancy.  Referral to surgery due to abnormal appearance of PEG tube.  It appears they considered removing abnormal sections of the tube and replacing the.  It was recommended he call the surgeon with any further complications.  Today he states he's doing ok overall. Has tonsilar cancer with chemo and radiation. He has a PEG tube for nutrition and states he is  currently not able to swallow anything. Has SLP evaluation and found likely need for EGD with UES dilation. Still with PEG tube. Unable to swallow food or liquids. Attempts result in "getting stuck in the throat." Is seeing therapy for movement in his neck. Denies abdominal pain, N/V, hematochezia, melena, fever, chills, unintentional weight loss. Does occasionally have blood tinged sputum when he has a lot of coughing. Denies URI or flu-like symptoms. Denies loss of sense of taste or smell. Denies chest pain, dyspnea, dizziness, lightheadedness, syncope, near syncope. Denies any other upper or lower GI symptoms.  Past Medical History:  Diagnosis Date  . HOH (hard of hearing)   . Hyperlipidemia   . Mass of oral cavity 05/12/2018  . Positive FIT (fecal immunochemical test) 05/12/2018  . PTSD (post-traumatic stress disorder)    has not been offically diagnosised  . Tonsillar cancer ([place]) 06/02/2018    Past Surgical History:  Procedure Laterality Date  . APPENDECTOMY  1969  . COLONOSCOPY W/ POLYPECTOMY     remote past  . ESOPHAGOGASTRODUODENOSCOPY  08/04/2018   Dr. Arnoldo Morale: erythema of larynx, normal stomach, normal duodenum. PEG Placed 20 F, bumper at 2.5 cm  . ESOPHAGOGASTRODUODENOSCOPY (EGD) WITH PROPOFOL N/A 08/04/2018   Procedure: ESOPHAGOGASTRODUODENOSCOPY (EGD) WITH PROPOFOL;  Surgeon: Aviva Signs, MD;  [facility];  Service: Gastroenterology;  Laterality: N/A;  . FINGER FRACTURE SURGERY Left   . MULTIPLE EXTRACTIONS WITH ALVEOLOPLASTY N/A 06/15/2018   Procedure: Extraction of tooth #'s 2,12,13,18,20,and 21 with alveoloplasty and gross debridement of remaining teeth;  Surgeon: Lenn Cal, DDS;  Location: [facility];  Service: Oral Surgery;  Laterality: N/A;  . PEG PLACEMENT N/A 08/04/2018   Procedure: PERCUTANEOUS ENDOSCOPIC GASTROSTOMY (PEG) PLACEMENT;  Surgeon: Aviva Signs, MD;  Location: [facility];  Service: Gastroenterology;  Laterality: N/A;  . PORTACATH PLACEMENT  Left 07/02/2018   Procedure: INSERTION PORT-A-CATH;  Surgeon: Aviva Signs, MD;  Location: [facility];  Service: General;  Laterality: Left;    Current Outpatient Medications  Medication Sig Dispense Refill  . Ensure Plus (ENSURE PLUS) LIQD Give via PEG. 2 bottles in morning. 2 in afternoon. 1 before bed.  0  . ibuprofen (ADVIL,MOTRIN) 200 MG tablet Take 400 mg by mouth every 6 (six) hours as needed for moderate pain.     Marland Kitchen levothyroxine (SYNTHROID) 25 MCG tablet Take 1 tablet (25 mcg total) by mouth daily before breakfast. 30 tablet 5  . Nutritional Supplements (PROMOD) LIQD 30 mls (1 oz) via PEG. 1x/day     No current facility-administered medications for this visit.     Allergies as of 10/27/2019  . (No Known Allergies)    Family History  Problem Relation Age of Onset  . Diabetes Father        died at age 51   . Colon cancer Neg Hx     Social History   Socioeconomic History  . Marital status: Divorced    Spouse name: Not on file  . Number of children: Not on file  . Years of education: Not on file  . Highest education level: Not on file  Occupational History  . Occupation: English as a second language teacher, retired  Scientific laboratory technician  . Financial resource strain: Not on file  . Food insecurity    Worry: Not on file    Inability: Not on file  . Transportation needs    Medical: Not on file    Non-medical: Not on file  Tobacco Use  . Smoking status: Former Smoker    Years: 15.00    Types: Cigarettes    Quit date: 2000    Years since quitting: 20.8  . Smokeless tobacco: Never Used  Substance and Sexual Activity  . Alcohol use: Not Currently    Alcohol/week: 12.0 standard drinks    Types: 12 Cans of beer per week  . Drug use: Never  . Sexual activity: Not Currently  Lifestyle  . Physical activity    Days per week: Not on file    Minutes per session: Not on file  . Stress: Not on file  Relationships  . Social Herbalist on phone: Not on file    Gets together: Not on file     Attends religious service: Not on file    Active member of club or organization: Not on file    Attends meetings of clubs or organizations: Not on file    Relationship status: Not on file  Other Topics Concern  . Not on file  Social History Narrative   Mr. Harpenau is a pleasant gentleman who is a retired English as a second language teacher.  He lives independently with his dog here in [place].  He formally lived in [place].    Review of Systems: General: Negative for anorexia, weight loss, fever, chills, fatigue, weakness. ENT: Negative for hoarseness, difficulty swallowing. CV: Negative for chest pain, angina, palpitations, peripheral edema.  Respiratory: Negative for dyspnea at rest, cough, sputum, wheezing.  GI: See history of present illness. Endo: Negative for unusual weight change.  Heme: Negative for bruising or bleeding. Allergy:  Negative for rash or hives.   Physical Exam: BP (!) 110/56   Pulse 77   Temp (!) 97.3 F (36.3 C) (Oral)   Ht 5\' 8"  (1.727 m)   Wt 150 lb (68 kg)   BMI 22.81 kg/m  General:   Alert and oriented. Pleasant and cooperative. Well-nourished and well-developed.  Eyes:  Without icterus, sclera clear and conjunctiva pink.  Ears:  Normal auditory acuity. Cardiovascular:  S1, S2 present without murmurs appreciated. Extremities without clubbing or edema. Respiratory:  Clear to auscultation bilaterally. No wheezes, rales, or rhonchi. No distress.  Gastrointestinal:  +BS, soft, non-tender and non-distended. No HSM noted. No guarding or rebound. No masses appreciated.  Rectal:  Deferred  Musculoskalatal:  Symmetrical without gross deformities. Neurologic:  Alert and oriented x4;  grossly normal neurologically. Psych:  Alert and cooperative. Normal mood and affect. Heme/Lymph/Immune: No excessive bruising noted.    10/27/2019 10:47 AM   Disclaimer: This note was dictated with voice recognition software. Similar sounding words can inadvertently be transcribed and may not be  corrected upon review.

## 2019-10-27 NOTE — Patient Instructions (Signed)
Your health issues we discussed today were:   Dysphagia (swallowing difficulties): 1. I will speak with Dr. Work and see if he is able to do an upper endoscopy with possible dilation given the tissue damage from radiation in your throat 2. If we proceed with an upper endoscopy I recommend having a colonoscopy at the same time 3. We will call and let she know what the plan is after I am able to speak to Dr. Gala Romney 4. Call us if you have any worsening or severe symptoms 5. Otherwise, continue using your PEG tube for nutrition  Overall I recommend:  1. Continue your other current medications 2. Return for follow-up based on recommendations after speaking with Dr. Jesusita Oka 3. Call us if you have any questions or concerns.   Because of recent events of COVID-19 ("Coronavirus"), follow CDC recommendations:  1. Wash your hand frequently 2. Avoid touching your face 3. Stay away from people who are sick 4. If you have symptoms such as fever, cough, shortness of breath then call your healthcare provider for further guidance 5. If you are sick, STAY AT HOME unless otherwise directed by your healthcare provider. 6. Follow directions from state and national officials regarding staying safe   At [facility] we value your feedback. You may receive a survey about your visit today. Please share your experience as we strive to create trusting relationships with our patients to provide genuine, compassionate, quality care.  We appreciate your understanding and patience as we review any laboratory studies, imaging, and other diagnostic tests that are ordered as we care for you. Our office policy is 5 business days for review of these results, and any emergent or urgent results are addressed in a timely manner for your best interest. If you do not hear from our office in 1 week, please contact us.   We also encourage the use of MyChart, which contains your medical information for your review as  well. If you are not enrolled in this feature, an access code is on this after visit summary for your convenience. Thank you for allowing [place] to be involved in your care.  It was great to see you today!  I hope you have a Happy Thanksgiving!!

## 2019-10-31 ENCOUNTER — Other Ambulatory Visit: Payer: Self-pay

## 2019-10-31 ENCOUNTER — Ambulatory Visit (HOSPITAL_COMMUNITY)
Admission: RE | Admit: 2019-10-31 | Discharge: 2019-10-31 | Disposition: A | Discharge status: Home / Self Care | Payer: Medicare Other | Source: Ambulatory Visit | Attending: Radiation Oncology | Admitting: Radiation Oncology

## 2019-10-31 DIAGNOSIS — C099 Malignant neoplasm of tonsil, unspecified: Secondary | ICD-10-CM | POA: Diagnosis not present

## 2019-10-31 MED ORDER — FLUDEOXYGLUCOSE F - 18 (FDG) INJECTION
9.3800 | Freq: Once | INTRAVENOUS | Status: AC | PRN
  Administered 2019-10-31: 9.38 via INTRAVENOUS

## 2019-11-03 NOTE — Assessment & Plan Note (Signed)
Patient has a history of tonsillar cancer status post chemotherapy and radiation.  He had a PEG tube placed for nutrition.  Is been over a year since his PEG tube was placed.  He has been very slow to get off the PEG tube.  He admits solid food dysphagia and frequently as things get "stuck in my esophagus."  It appears his cancer is in remission at this time.  Most recent PET scan showed resolution of the left tonsillar mass and neck adenopathy with no current findings of active malignancy.  They are wanting to remove the PEG tube.  They are requesting consideration of upper endoscopy with possible dilation to see if we can improve his dysphagia symptoms at all and allow for more rapid removal of PEG tube.  Discussed with Dr. Work and he agrees with proceeding.  We will schedule at this time.  Proceed with EGD +/- dilation on propofol/MAC with Dr. Gala Romney in near future: the risks, benefits, and alternatives have been discussed with the patient in detail. The patient states understanding and desires to proceed.  Patient is not on any anticoagulants, antidepressants, chronic pain medications, or anxiolytics.  Not currently drinking any alcohol but previously drank 12 beers a week.  Denies drug use.  Due to complicated medical history and previous alcohol use we will plan for the procedure on propofol/MAC to promote adequate sedation as was done for his last procedure.

## 2019-11-03 NOTE — Assessment & Plan Note (Addendum)
History of positive fecal immunochemistry test.  Previously recommended colonoscopy.  His last colonoscopy was 15 years ago.  He is generally declined thus far due to multiple bills.  The no need to complete the EGD at this time we will proceed with a colonoscopy at the same time.  We will call notify the patient and ensure it is still okay with this plan.  Return for follow-up in 4 months.  Proceed with TCS on propofol/MAC with Dr. Gala Romney in near future: the risks, benefits, and alternatives have been discussed with the patient in detail. The patient states understanding and desires to proceed.  Patient is not on any anticoagulants, antidepressants, chronic pain medications, or anxiolytics.  Not currently drinking any alcohol but previously drank 12 beers a week.  Denies drug use.  Due to complicated medical history and previous alcohol use we will plan for the procedure on propofol/MAC to promote adequate sedation as was done for his last procedure.

## 2019-11-07 ENCOUNTER — Telehealth: Payer: Self-pay

## 2019-11-07 NOTE — Telephone Encounter (Signed)
EG spoke to RMR and got ok to do TCS same time as EGD/-/+DIL w/Propofol. Tried to call pt, no answer, LMOVM for return call.

## 2019-11-08 NOTE — Telephone Encounter (Signed)
Tried to call pt, rang several times then call was disconnected.

## 2019-11-09 NOTE — Telephone Encounter (Signed)
Letter mailed to pt.  

## 2019-11-14 ENCOUNTER — Other Ambulatory Visit: Payer: Self-pay

## 2019-11-14 DIAGNOSIS — R1319 Other dysphagia: Secondary | ICD-10-CM

## 2019-11-14 DIAGNOSIS — R1310 Dysphagia, unspecified: Secondary | ICD-10-CM

## 2019-11-14 DIAGNOSIS — R195 Other fecal abnormalities: Secondary | ICD-10-CM

## 2019-11-14 MED ORDER — PEG 3350-KCL-NA BICARB-NACL 420 G PO SOLR: 4000.0000 mL | ORAL | 0 refills | Status: DC

## 2019-11-14 NOTE — Telephone Encounter (Signed)
Called pt, TCS/EGD/-/+DIL w/Propofol w/RMR scheduled for 02/16/20 at 1:45pm. Rx for prep sent to pharmacy. Orders entered.

## 2019-11-14 NOTE — Telephone Encounter (Signed)
Pt returning a call.  Pt requested [place] to speak loudly when calling him back or if we have to leave a message.  He says he is hard of hearing.  [phone]

## 2019-11-15 ENCOUNTER — Encounter: Payer: Self-pay | Admitting: *Deleted

## 2019-11-21 ENCOUNTER — Other Ambulatory Visit: Payer: Self-pay

## 2019-11-21 ENCOUNTER — Ambulatory Visit (INDEPENDENT_AMBULATORY_CARE_PROVIDER_SITE_OTHER): Payer: Medicare Other | Admitting: Otolaryngology

## 2019-11-21 DIAGNOSIS — Z8589 Personal history of malignant neoplasm of other organs and systems: Secondary | ICD-10-CM

## 2019-11-21 DIAGNOSIS — H9111 Presbycusis, right ear: Secondary | ICD-10-CM

## 2019-11-21 DIAGNOSIS — H9011 Conductive hearing loss, unilateral, right ear, with unrestricted hearing on the contralateral side: Secondary | ICD-10-CM | POA: Diagnosis not present

## 2019-11-21 DIAGNOSIS — H6123 Impacted cerumen, bilateral: Secondary | ICD-10-CM

## 2019-11-21 DIAGNOSIS — H6982 Other specified disorders of Eustachian tube, left ear: Secondary | ICD-10-CM

## 2019-12-14 ENCOUNTER — Telehealth (HOSPITAL_COMMUNITY): Payer: Self-pay

## 2019-12-14 NOTE — Telephone Encounter (Signed)
Nutrition  Received message that patient came by cancer center wanting to speak with RD.  RD only on site on Fridays.  Called patient to see how RD could help.  Patient with questions regarding enteral tube feeding formula and supplies delivery.  [facility] providing supplies to patient.    Kayren Holck B. Zenia Resides, [place], [place] Registered Dietitian [phone] (pager)

## 2019-12-30 ENCOUNTER — Telehealth (HOSPITAL_COMMUNITY): Payer: Self-pay

## 2019-12-30 NOTE — Telephone Encounter (Signed)
Nutrition Follow-up:  Patient with large left tonsillar mass, SCC.  Patient has completed treatment. Patient with PEG tube.    Spoke with patient via phone today for nutrition follow-up.  Patient reports that he continues to give 2 cartons of ensure plus in the am via feeding tube and 2 cartons of ensure plus in the pm via feeding tube.  Also buys an array of drinks to put in tube at lunch time (boathouse, naked, boost, tomato soup) that provide additional 230-300 calories and 15-20 g of protein.  Reports that he wants some variety going through tube.  Reports that he has been giving about 16oz of water via tube.  Reports normal bowel movement (solid) 1 -2 times per day.  Patient reports baking cookies for family over the holidays. He was able to taste them in his mouth but unable to swallow.    Patient has completed physical therapy for neck.     Patient reports all medications are going through tube.   Medications: reviewed  Labs: no new  Anthropometrics:   Weight noted 9/22 in clinic 154 lb and 150 lb 11/5 at MD office.    Patient reports he weighs every morning t-shirt and shorts around in the 148-149 lb.     Estimated Energy Needs  Kcals: 1700-1900 Protein: 80-94 g Fluid: > 2 L  NUTRITION DIAGNOSIS: Inability to eat continues to rely on feeding tube   INTERVENTION:  Patient unable to swallow at this time and will need to continue to rely on tube feeding for nutrition.   Patient to continue to use 4 ensure plus per day, providing 1400 calories and 64 g of protein.  With additional beverages patient is believed to be meeting nutritional needs.  Recommended patient increase water to 2, 16 oz bottles of water daily in tube.   Patient with questions and concerns regarding [place] and encouraged patient to reach out to [facility] team member.  Patient has contact information    MONITORING, EVALUATION, GOAL: weight, TF tolerance, po intake, swallow function.    NEXT  VISIT: as needed  Grecia Lynk B. Zenia Resides, [place], [place] Registered Dietitian [phone] (pager)

## 2020-01-13 ENCOUNTER — Telehealth: Payer: Self-pay | Admitting: Internal Medicine

## 2020-01-13 ENCOUNTER — Other Ambulatory Visit: Payer: Self-pay

## 2020-01-13 MED ORDER — PEG 3350-KCL-NA BICARB-NACL 420 G PO SOLR: 4000.0000 mL | ORAL | 0 refills | Status: DC

## 2020-01-13 NOTE — Telephone Encounter (Signed)
Rx resent. Pt is aware.

## 2020-01-13 NOTE — Telephone Encounter (Signed)
PLEASE SENT PATIENT PREP TO HIS PHARMACY AGAIN,  HE STATED THEY DO NOT HAVE IT.

## 2020-01-18 ENCOUNTER — Telehealth: Payer: Self-pay | Admitting: Internal Medicine

## 2020-01-18 NOTE — Telephone Encounter (Signed)
Patient called and said that his pharmacy is not able to get the prep that was sent in, he needs an alternative

## 2020-01-19 NOTE — Telephone Encounter (Signed)
Called pt, Miralax prep instructions mailed.

## 2020-02-13 NOTE — Patient Instructions (Signed)
[name]  02/13/2020     @PREFPERIOPPHARMACY @   Your procedure is scheduled on  02/16/2020 .  Report to [facility] at  1215  P.M.  Call this number if you have problems the morning of surgery:  [phone]   Remember:  Follow the diet and prep instructions given to you by Dr Roseanne Kaufman office.                        Take these medicines the morning of surgery with A SIP OF WATER  Levothyroxine, trental, lyrica.    Do not wear jewelry, make-up or nail polish.  Do not wear lotions, powders, or perfumes. Please wear deodorant and brush your teeth.  Do not shave 48 hours prior to surgery.  Men may shave face and neck.  Do not bring valuables to the hospital.  [facility] is not responsible for any belongings or valuables.  Contacts, dentures or bridgework may not be worn into surgery.  Leave your suitcase in the car.  After surgery it may be brought to your room.  For patients admitted to the hospital, discharge time will be determined by your treatment team.  Patients discharged the day of surgery will not be allowed to drive home.   Name and phone number of your driver:   family Special instructions:  DO NOT smoke the morning of your procedure.  Please read over the following fact sheets that you were given. Anesthesia Post-op Instructions and Care and Recovery After Surgery       Upper Endoscopy, Adult, Care After This sheet gives you information about how to care for yourself after your procedure. Your health care provider may also give you more specific instructions. If you have problems or questions, contact your health care provider. What can I expect after the procedure? After the procedure, it is common to have:  A sore throat.  Mild stomach pain or discomfort.  Bloating.  Nausea. Follow these instructions at home:   Follow instructions from your health care provider about what to eat or drink after your procedure.  Return to your  normal activities as told by your health care provider. Ask your health care provider what activities are safe for you.  Take over-the-counter and prescription medicines only as told by your health care provider.  Do not drive for 24 hours if you were given a sedative during your procedure.  Keep all follow-up visits as told by your health care provider. This is important. Contact a health care provider if you have:  A sore throat that lasts longer than one day.  Trouble swallowing. Get help right away if:  You vomit blood or your vomit looks like coffee grounds.  You have: ? A fever. ? Bloody, black, or tarry stools. ? A severe sore throat or you cannot swallow. ? Difficulty breathing. ? Severe pain in your chest or abdomen. Summary  After the procedure, it is common to have a sore throat, mild stomach discomfort, bloating, and nausea.  Do not drive for 24 hours if you were given a sedative during the procedure.  Follow instructions from your health care provider about what to eat or drink after your procedure.  Return to your normal activities as told by your health care provider. This information is not intended to replace advice given to you by your health care provider. Make sure you discuss any questions you have  with your health care provider. Document Revised: 06/01/2018 Document Reviewed: 05/10/2018 Elsevier Patient Education  [name].  Esophageal Dilatation Esophageal dilatation, also called esophageal dilation, is a procedure to widen or open (dilate) a blocked or narrowed part of the esophagus. The esophagus is the part of the body that moves food and liquid from the mouth to the stomach. You may need this procedure if:  You have a buildup of scar tissue in your esophagus that makes it difficult, painful, or impossible to swallow. This can be caused by gastroesophageal reflux disease (GERD).  You have cancer of the esophagus.  There is a problem with  how food moves through your esophagus. In some cases, you may need this procedure repeated at a later time to dilate the esophagus gradually. Tell a health care provider about:  Any allergies you have.  All medicines you are taking, including vitamins, herbs, eye drops, creams, and over-the-counter medicines.  Any problems you or family members have had with anesthetic medicines.  Any blood disorders you have.  Any surgeries you have had.  Any medical conditions you have.  Any antibiotic medicines you are required to take before dental procedures.  Whether you are pregnant or may be pregnant. What are the risks? Generally, this is a safe procedure. However, problems may occur, including:  Bleeding due to a tear in the lining of the esophagus.  A hole (perforation) in the esophagus. What happens before the procedure?  Follow instructions from your health care provider about eating or drinking restrictions.  Ask your health care provider about changing or stopping your regular medicines. This is especially important if you are taking diabetes medicines or blood thinners.  Plan to have someone take you home from the hospital or clinic.  Plan to have a responsible adult care for you for at least 24 hours after you leave the hospital or clinic. This is important. What happens during the procedure?  You may be given a medicine to help you relax (sedative).  A numbing medicine may be sprayed into the back of your throat, or you may gargle the medicine.  Your health care provider may perform the dilatation using various surgical instruments, such as: ? Simple dilators. This instrument is carefully placed in the esophagus to stretch it. ? Guided wire bougies. This involves using an endoscope to insert a wire into the esophagus. A dilator is passed over this wire to enlarge the esophagus. Then the wire is removed. ? Balloon dilators. An endoscope with a small balloon at the end is  inserted into the esophagus. The balloon is inflated to stretch the esophagus and open it up. The procedure may vary among health care providers and hospitals. What happens after the procedure?  Your blood pressure, heart rate, breathing rate, and blood oxygen level will be monitored until the medicines you were given have worn off.  Your throat may feel slightly sore and numb. This will improve slowly over time.  You will not be allowed to eat or drink until your throat is no longer numb.  When you are able to drink, urinate, and sit on the edge of the bed without nausea or dizziness, you may be able to return home. Follow these instructions at home:  Take over-the-counter and prescription medicines only as told by your health care provider.  Do not drive for 24 hours if you were given a sedative during your procedure.  You should have a responsible adult with you for 24 hours  after the procedure.  Follow instructions from your health care provider about any eating or drinking restrictions.  Do not use any products that contain nicotine or tobacco, such as cigarettes and e-cigarettes. If you need help quitting, ask your health care provider.  Keep all follow-up visits as told by your health care provider. This is important. Get help right away if you:  Have a fever.  Have chest pain.  Have pain that is not relieved by medication.  Have trouble breathing.  Have trouble swallowing.  Vomit blood. Summary  Esophageal dilatation, also called esophageal dilation, is a procedure to widen or open (dilate) a blocked or narrowed part of the esophagus.  Plan to have someone take you home from the hospital or clinic.  For this procedure, a numbing medicine may be sprayed into the back of your throat, or you may gargle the medicine.  Do not drive for 24 hours if you were given a sedative during your procedure. This information is not intended to replace advice given to you by your  health care provider. Make sure you discuss any questions you have with your health care provider. Document Revised: 10/05/2019 Document Reviewed: 10/13/2017 Elsevier Patient Education  [place].  Colonoscopy, Adult, Care After This sheet gives you information about how to care for yourself after your procedure. Your health care provider may also give you more specific instructions. If you have problems or questions, contact your health care provider. What can I expect after the procedure? After the procedure, it is common to have:  A small amount of blood in your stool for 24 hours after the procedure.  Some gas.  Mild cramping or bloating of your abdomen. Follow these instructions at home: Eating and drinking   Drink enough fluid to keep your urine pale yellow.  Follow instructions from your health care provider about eating or drinking restrictions.  Resume your normal diet as instructed by your health care provider. Avoid heavy or fried foods that are hard to digest. Activity  Rest as told by your health care provider.  Avoid sitting for a long time without moving. Get up to take short walks every 1-2 hours. This is important to improve blood flow and breathing. Ask for help if you feel weak or unsteady.  Return to your normal activities as told by your health care provider. Ask your health care provider what activities are safe for you. Managing cramping and bloating   Try walking around when you have cramps or feel bloated.  Apply heat to your abdomen as told by your health care provider. Use the heat source that your health care provider recommends, such as a moist heat pack or a heating pad. ? Place a towel between your skin and the heat source. ? Leave the heat on for 20-30 minutes. ? Remove the heat if your skin turns bright red. This is especially important if you are unable to feel pain, heat, or cold. You may have a greater risk of getting burned. General  instructions  For the first 24 hours after the procedure: ? Do not drive or use machinery. ? Do not sign important documents. ? Do not drink alcohol. ? Do your regular daily activities at a slower pace than normal. ? Eat soft foods that are easy to digest.  Take over-the-counter and prescription medicines only as told by your health care provider.  Keep all follow-up visits as told by your health care provider. This is important. Contact a health  care provider if:  You have blood in your stool 2-3 days after the procedure. Get help right away if you have:  More than a small spotting of blood in your stool.  Large blood clots in your stool.  Swelling of your abdomen.  Nausea or vomiting.  A fever.  Increasing pain in your abdomen that is not relieved with medicine. Summary  After the procedure, it is common to have a small amount of blood in your stool. You may also have mild cramping and bloating of your abdomen.  For the first 24 hours after the procedure, do not drive or use machinery, sign important documents, or drink alcohol.  Get help right away if you have a lot of blood in your stool, nausea or vomiting, a fever, or increased pain in your abdomen. This information is not intended to replace advice given to you by your health care provider. Make sure you discuss any questions you have with your health care provider. Document Revised: 07/04/2019 Document Reviewed: 07/04/2019 Elsevier Patient Education  [place] After These instructions provide you with information about caring for yourself after your procedure. Your health care provider may also give you more specific instructions. Your treatment has been planned according to current medical practices, but problems sometimes occur. Call your health care provider if you have any problems or questions after your procedure. What can I expect after the procedure? After your  procedure, you may:  Feel sleepy for several hours.  Feel clumsy and have poor balance for several hours.  Feel forgetful about what happened after the procedure.  Have poor judgment for several hours.  Feel nauseous or vomit.  Have a sore throat if you had a breathing tube during the procedure. Follow these instructions at home: For at least 24 hours after the procedure:      Have a responsible adult stay with you. It is important to have someone help care for you until you are awake and alert.  Rest as needed.  Do not: ? Participate in activities in which you could fall or become injured. ? Drive. ? Use heavy machinery. ? Drink alcohol. ? Take sleeping pills or medicines that cause drowsiness. ? Make important decisions or sign legal documents. ? Take care of children on your own. Eating and drinking  Follow the diet that is recommended by your health care provider.  If you vomit, drink water, juice, or soup when you can drink without vomiting.  Make sure you have little or no nausea before eating solid foods. General instructions  Take over-the-counter and prescription medicines only as told by your health care provider.  If you have sleep apnea, surgery and certain medicines can increase your risk for breathing problems. Follow instructions from your health care provider about wearing your sleep device: ? Anytime you are sleeping, including during daytime naps. ? While taking prescription pain medicines, sleeping medicines, or medicines that make you drowsy.  If you smoke, do not smoke without supervision.  Keep all follow-up visits as told by your health care provider. This is important. Contact a health care provider if:  You keep feeling nauseous or you keep vomiting.  You feel light-headed.  You develop a rash.  You have a fever. Get help right away if:  You have trouble breathing. Summary  For several hours after your procedure, you may feel  sleepy and have poor judgment.  Have a responsible adult stay with you for at least  24 hours or until you are awake and alert. This information is not intended to replace advice given to you by your health care provider. Make sure you discuss any questions you have with your health care provider. Document Revised: 03/08/2018 Document Reviewed: 03/30/2016 Elsevier Patient Education  [place].

## 2020-02-14 ENCOUNTER — Other Ambulatory Visit: Payer: Self-pay

## 2020-02-14 ENCOUNTER — Telehealth: Payer: Self-pay | Admitting: Internal Medicine

## 2020-02-14 ENCOUNTER — Encounter (HOSPITAL_COMMUNITY)
Admission: RE | Admit: 2020-02-14 | Discharge: 2020-02-14 | Disposition: A | Discharge status: Home / Self Care | Payer: Medicare Other | Source: Ambulatory Visit | Attending: Internal Medicine | Admitting: Internal Medicine

## 2020-02-14 ENCOUNTER — Encounter (HOSPITAL_COMMUNITY): Payer: Self-pay

## 2020-02-14 ENCOUNTER — Other Ambulatory Visit (HOSPITAL_COMMUNITY)
Admission: RE | Admit: 2020-02-14 | Discharge: 2020-02-14 | Disposition: A | Discharge status: Home / Self Care | Payer: Medicare Other | Source: Ambulatory Visit | Attending: Internal Medicine | Admitting: Internal Medicine

## 2020-02-14 DIAGNOSIS — Z01812 Encounter for preprocedural laboratory examination: Secondary | ICD-10-CM | POA: Diagnosis not present

## 2020-02-14 DIAGNOSIS — Z20822 Contact with and (suspected) exposure to covid-19: Secondary | ICD-10-CM | POA: Insufficient documentation

## 2020-02-14 LAB — BASIC METABOLIC PANEL
Anion gap: 8 (ref 5–15)
BUN: 14 mg/dL (ref 8–23)
CO2: 29 mmol/L (ref 22–32)
Calcium: 9.1 mg/dL (ref 8.9–10.3)
Chloride: 99 mmol/L (ref 98–111)
Creatinine, Ser: 0.87 mg/dL (ref 0.61–1.24)
GFR calc Af Amer: >60 mL/min (ref >60)
GFR calc non Af Amer: >60 mL/min (ref >60)
Glucose, Bld: 102 mg/dL — ABNORMAL HIGH (ref 70–99)
Potassium: 3.9 mmol/L (ref 3.5–5.1)
Sodium: 136 mmol/L (ref 135–145)

## 2020-02-14 LAB — CBC WITH DIFFERENTIAL/PLATELET
Abs Immature Granulocytes: 0 10*3/uL (ref 0.00–0.07)
Basophils Absolute: 0 10*3/uL (ref 0.0–0.1)
Basophils Relative: 1 %
Eosinophils Absolute: 0 10*3/uL (ref 0.0–0.5)
Eosinophils Relative: 1 %
HCT: 38.4 % — ABNORMAL LOW (ref 39.0–52.0)
Hemoglobin: 12.4 g/dL — ABNORMAL LOW (ref 13.0–17.0)
Immature Granulocytes: 0 %
Lymphocytes Relative: 21 %
Lymphs Abs: 0.4 10*3/uL — ABNORMAL LOW (ref 0.7–4.0)
MCH: 31.7 pg (ref 26.0–34.0)
MCHC: 32.3 g/dL (ref 30.0–36.0)
MCV: 98.2 fL (ref 80.0–100.0)
Monocytes Absolute: 0.3 10*3/uL (ref 0.1–1.0)
Monocytes Relative: 14 %
Neutro Abs: 1.1 10*3/uL — ABNORMAL LOW (ref 1.7–7.7)
Neutrophils Relative %: 63 %
Platelets: 143 10*3/uL — ABNORMAL LOW (ref 150–400)
RBC: 3.91 MIL/uL — ABNORMAL LOW (ref 4.22–5.81)
RDW: 13.6 % (ref 11.5–15.5)
WBC: 1.8 10*3/uL — ABNORMAL LOW (ref 4.0–10.5)
nRBC: 0 % (ref 0.0–0.2)

## 2020-02-14 LAB — SARS CORONAVIRUS 2 (TAT 6-24 HRS): SARS Coronavirus 2: NEGATIVE

## 2020-02-14 NOTE — Telephone Encounter (Signed)
PA for EGD submitted via [facility] website. Case approved. PA# [id number], valid 02/16/20-05/16/20.  LMOVM to inform Estill Bamberg at pre-service center.

## 2020-02-14 NOTE — Telephone Encounter (Signed)
Estill Bamberg from the pre service center called to say patient is scheduled with [facility] on 2/25 for TCS/EGD and the EGD will need a prior auth. Please call [phone] ext [phone]

## 2020-02-15 ENCOUNTER — Other Ambulatory Visit (HOSPITAL_COMMUNITY): Payer: Self-pay | Admitting: Hematology

## 2020-02-16 ENCOUNTER — Encounter: Payer: Self-pay | Admitting: Internal Medicine

## 2020-02-16 ENCOUNTER — Encounter (HOSPITAL_COMMUNITY): Payer: Self-pay | Admitting: Internal Medicine

## 2020-02-16 ENCOUNTER — Ambulatory Visit (HOSPITAL_COMMUNITY): Payer: Medicare Other | Admitting: Anesthesiology

## 2020-02-16 ENCOUNTER — Encounter (HOSPITAL_COMMUNITY)
Admission: RE | Disposition: A | Discharge status: Home / Self Care | Payer: Self-pay | Source: Home / Self Care | Attending: Internal Medicine

## 2020-02-16 ENCOUNTER — Ambulatory Visit (HOSPITAL_COMMUNITY)
Admission: RE | Admit: 2020-02-16 | Discharge: 2020-02-16 | Disposition: A | Discharge status: Home / Self Care | Payer: Medicare Other | Attending: Internal Medicine | Admitting: Internal Medicine

## 2020-02-16 DIAGNOSIS — R195 Other fecal abnormalities: Secondary | ICD-10-CM | POA: Diagnosis present

## 2020-02-16 DIAGNOSIS — Z931 Gastrostomy status: Secondary | ICD-10-CM | POA: Diagnosis not present

## 2020-02-16 DIAGNOSIS — R1319 Other dysphagia: Secondary | ICD-10-CM

## 2020-02-16 DIAGNOSIS — Z85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, oral cavity, and pharynx: Secondary | ICD-10-CM | POA: Diagnosis not present

## 2020-02-16 DIAGNOSIS — Z923 Personal history of irradiation: Secondary | ICD-10-CM | POA: Diagnosis not present

## 2020-02-16 DIAGNOSIS — K635 Polyp of colon: Secondary | ICD-10-CM

## 2020-02-16 DIAGNOSIS — K5730 Diverticulosis of large intestine without perforation or abscess without bleeding: Secondary | ICD-10-CM | POA: Insufficient documentation

## 2020-02-16 DIAGNOSIS — Z87891 Personal history of nicotine dependence: Secondary | ICD-10-CM | POA: Diagnosis not present

## 2020-02-16 DIAGNOSIS — E039 Hypothyroidism, unspecified: Secondary | ICD-10-CM | POA: Diagnosis not present

## 2020-02-16 DIAGNOSIS — R1310 Dysphagia, unspecified: Secondary | ICD-10-CM | POA: Diagnosis not present

## 2020-02-16 DIAGNOSIS — D120 Benign neoplasm of cecum: Secondary | ICD-10-CM | POA: Diagnosis not present

## 2020-02-16 HISTORY — PX: POLYPECTOMY: SHX5525

## 2020-02-16 HISTORY — PX: COLONOSCOPY WITH PROPOFOL: SHX5780

## 2020-02-16 HISTORY — PX: ESOPHAGOGASTRODUODENOSCOPY (EGD) WITH PROPOFOL: SHX5813

## 2020-02-16 SURGERY — COLONOSCOPY WITH PROPOFOL
Anesthesia: General

## 2020-02-16 MED ORDER — PROPOFOL 10 MG/ML IV BOLUS
INTRAVENOUS | Status: DC | PRN
  Administered 2020-02-16 (×4): 20 mg via INTRAVENOUS
  Administered 2020-02-16: 10 mg via INTRAVENOUS
  Administered 2020-02-16: 25 mg via INTRAVENOUS

## 2020-02-16 MED ORDER — CHLORHEXIDINE GLUCONATE CLOTH 2 % EX PADS: 6.0000 | MEDICATED_PAD | Freq: Once | CUTANEOUS | Status: DC

## 2020-02-16 MED ORDER — EPHEDRINE SULFATE 50 MG/ML IJ SOLN
INTRAMUSCULAR | Status: DC | PRN
  Administered 2020-02-16: 10 mg via INTRAVENOUS

## 2020-02-16 MED ORDER — GLYCOPYRROLATE 0.2 MG/ML IJ SOLN
INTRAMUSCULAR | Status: AC
  Filled 2020-02-16: qty 1

## 2020-02-16 MED ORDER — DEXMEDETOMIDINE HCL IN NACL 200 MCG/50ML IV SOLN
INTRAVENOUS | Status: DC | PRN
  Administered 2020-02-16: 10 ug via INTRAVENOUS
  Administered 2020-02-16: 6 ug via INTRAVENOUS
  Administered 2020-02-16: 4 ug via INTRAVENOUS

## 2020-02-16 MED ORDER — GLYCOPYRROLATE 0.2 MG/ML IJ SOLN
INTRAMUSCULAR | Status: DC | PRN
  Administered 2020-02-16: .2 mg via INTRAVENOUS

## 2020-02-16 MED ORDER — PROPOFOL 10 MG/ML IV BOLUS
INTRAVENOUS | Status: AC
  Filled 2020-02-16: qty 20

## 2020-02-16 MED ORDER — LACTATED RINGERS IV SOLN: Freq: Once | INTRAVENOUS | Status: AC

## 2020-02-16 MED ORDER — ESMOLOL HCL 100 MG/10ML IV SOLN
INTRAVENOUS | Status: DC | PRN
  Administered 2020-02-16: 20 mg via INTRAVENOUS

## 2020-02-16 MED ORDER — PROPOFOL 10 MG/ML IV BOLUS
INTRAVENOUS | Status: AC
  Filled 2020-02-16: qty 40

## 2020-02-16 MED ORDER — PROPOFOL 500 MG/50ML IV EMUL
INTRAVENOUS | Status: DC | PRN
  Administered 2020-02-16: 20 ug/kg/min via INTRAVENOUS
  Administered 2020-02-16: 50 ug/kg/min via INTRAVENOUS

## 2020-02-16 MED ORDER — DEXMEDETOMIDINE HCL IN NACL 200 MCG/50ML IV SOLN
INTRAVENOUS | Status: AC
  Filled 2020-02-16: qty 50

## 2020-02-16 MED ORDER — KETAMINE HCL 50 MG/5ML IJ SOSY
PREFILLED_SYRINGE | INTRAMUSCULAR | Status: AC
  Filled 2020-02-16: qty 5

## 2020-02-16 MED ORDER — LACTATED RINGERS IV SOLN: INTRAVENOUS | Status: DC | PRN

## 2020-02-16 MED ORDER — KETAMINE HCL 10 MG/ML IJ SOLN
INTRAMUSCULAR | Status: DC | PRN
  Administered 2020-02-16: 25 mg via INTRAVENOUS
  Administered 2020-02-16: 15 mg via INTRAVENOUS
  Administered 2020-02-16: 10 mg via INTRAVENOUS

## 2020-02-16 MED ORDER — ESMOLOL HCL 100 MG/10ML IV SOLN
INTRAVENOUS | Status: AC
  Filled 2020-02-16: qty 10

## 2020-02-16 NOTE — Anesthesia Preprocedure Evaluation (Signed)
Anesthesia Evaluation  Patient identified by MRN, date of birth, ID band Patient awake    Reviewed: Allergy & Precautions, NPO status , Patient's Chart, lab work & pertinent test results  Airway Mallampati: IV  TM Distance: >3 FB Neck ROM: Limited  Mouth opening: Limited Mouth Opening  Dental no notable dental hx. (+) Teeth Intact   Pulmonary neg pulmonary ROS, former smoker,    Pulmonary exam normal breath sounds clear to auscultation       Cardiovascular Exercise Tolerance: Good negative cardio ROS Normal cardiovascular examI Rhythm:Regular Rate:Normal     Neuro/Psych Anxiety negative neurological ROS  negative psych ROS   GI/Hepatic negative GI ROS, Neg liver ROS, Bowel prep,  Endo/Other  Hypothyroidism   Renal/GU negative Renal ROS  negative genitourinary   Musculoskeletal negative musculoskeletal ROS (+)   Abdominal   Peds negative pediatric ROS (+)  Hematology negative hematology ROS (+)   Anesthesia Other Findings   Reproductive/Obstetrics negative OB ROS                             Anesthesia Physical Anesthesia Plan  ASA: IV  Anesthesia Plan: General   Post-op Pain Management:    Induction: Intravenous  PONV Risk Score and Plan: 2 and TIVA and Midazolam  Airway Management Planned:   Additional Equipment:   Intra-op Plan:   Post-operative Plan:   Informed Consent: I have reviewed the patients History and Physical, chart, labs and discussed the procedure including the risks, benefits and alternatives for the proposed anesthesia with the patient or authorized representative who has indicated his/her understanding and acceptance.     Dental advisory given  Plan Discussed with: CRNA  Anesthesia Plan Comments: (Plan Full PPE use  Plan GA with GETA as needed d/w pt -WTP with same after Q&A  D/W pt and Dr. Gala Romney this is very high risk, looks like a VERY difficult  intubation  Very small mouth opening, neutropenic, will try to keep SR, but at risk of larygospasm from secretions, Neck very fixed from Radiation damage, d/w pt possible need for postprocedural ventilation - voiced understanding WTP)        Anesthesia Quick Evaluation

## 2020-02-16 NOTE — Discharge Instructions (Signed)
Colonoscopy Discharge Instructions  Read the instructions outlined below and refer to this sheet in the next few weeks. These discharge instructions provide you with general information on caring for yourself after you leave the hospital. Your doctor may also give you specific instructions. While your treatment has been planned according to the most current medical practices available, unavoidable complications occasionally occur. If you have any problems or questions after discharge, call Dr. Gala Romney at [phone]. ACTIVITY  You may resume your regular activity, but move at a slower pace for the next 24 hours.   Take frequent rest periods for the next 24 hours.   Walking will help get rid of the air and reduce the bloated feeling in your belly (abdomen).   No driving for 24 hours (because of the medicine (anesthesia) used during the test).    Do not sign any important legal documents or operate any machinery for 24 hours (because of the anesthesia used during the test).  NUTRITION  Drink plenty of fluids.   You may resume your normal diet as instructed by your doctor.   Begin with a light meal and progress to your normal diet. Heavy or fried foods are harder to digest and may make you feel sick to your stomach (nauseated).   Avoid alcoholic beverages for 24 hours or as instructed.  MEDICATIONS  You may resume your normal medications unless your doctor tells you otherwise.  WHAT YOU CAN EXPECT TODAY  Some feelings of bloating in the abdomen.   Passage of more gas than usual.   Spotting of blood in your stool or on the toilet paper.  IF YOU HAD POLYPS REMOVED DURING THE COLONOSCOPY:  No aspirin products for 7 days or as instructed.   No alcohol for 7 days or as instructed.   Eat a soft diet for the next 24 hours.  FINDING OUT THE RESULTS OF YOUR TEST Not all test results are available during your visit. If your test results are not back during the visit, make an appointment  with your caregiver to find out the results. Do not assume everything is normal if you have not heard from your caregiver or the medical facility. It is important for you to follow up on all of your test results.  SEEK IMMEDIATE MEDICAL ATTENTION IF:  You have more than a spotting of blood in your stool.   Your belly is swollen (abdominal distention).   You are nauseated or vomiting.   You have a temperature over 101.   You have abdominal pain or discomfort that is severe or gets worse throughout the day.   Colon polyp and diverticulosis information provided  Further recommendations to follow pending review of pathology report  Your throat/esophagus was closed off.  I was unable to get the scope across the blockage  I recommend you plan to see your ENT doctor as soon as possible  At patient request, I called Lennette Bihari at [phone] was unable to reach.     Colon Polyps  Polyps are tissue growths inside the body. Polyps can grow in many places, including the large intestine (colon). A polyp may be a round bump or a mushroom-shaped growth. You could have one polyp or several. Most colon polyps are noncancerous (benign). However, some colon polyps can become cancerous over time. Finding and removing the polyps early can help prevent this. What are the causes? The exact cause of colon polyps is not known. What increases the risk? You are more likely to develop  this condition if you:  Have a family history of colon cancer or colon polyps.  Are older than 84 or older than 45 if you are African American.  Have inflammatory bowel disease, such as ulcerative colitis or Crohn's disease.  Have certain hereditary conditions, such as: ? Familial adenomatous polyposis. ? Lynch syndrome. ? Turcot syndrome. ? Peutz-Jeghers syndrome.  Are overweight.  Smoke cigarettes.  Do not get enough exercise.  Drink too much alcohol.  Eat a diet that is high in fat and red meat and low in  fiber.  Had childhood cancer that was treated with abdominal radiation. What are the signs or symptoms? Most polyps do not cause symptoms. If you have symptoms, they may include:  Blood coming from your rectum when having a bowel movement.  Blood in your stool. The stool may look dark red or black.  Abdominal pain.  A change in bowel habits, such as constipation or diarrhea. How is this diagnosed? This condition is diagnosed with a colonoscopy. This is a procedure in which a lighted, flexible scope is inserted into the anus and then passed into the colon to examine the area. Polyps are sometimes found when a colonoscopy is done as part of routine cancer screening tests. How is this treated? Treatment for this condition involves removing any polyps that are found. Most polyps can be removed during a colonoscopy. Those polyps will then be tested for cancer. Additional treatment may be needed depending on the results of testing. Follow these instructions at home: Lifestyle  Maintain a healthy weight, or lose weight if recommended by your health care provider.  Exercise every day or as told by your health care provider.  Do not use any products that contain nicotine or tobacco, such as cigarettes and e-cigarettes. If you need help quitting, ask your health care provider.  If you drink alcohol, limit how much you have: ? 0-1 drink a day for women. ? 0-2 drinks a day for men.  Be aware of how much alcohol is in your drink. In the [place] one drink equals one 12 oz bottle of beer (355 mL), one 5 oz glass of wine (148 mL), or one 1 oz shot of hard liquor (44 mL). Eating and drinking   Eat foods that are high in fiber, such as fruits, vegetables, and whole grains.  Eat foods that are high in calcium and vitamin D, such as milk, cheese, yogurt, eggs, liver, fish, and broccoli.  Limit foods that are high in fat, such as fried foods and desserts.  Limit the amount of red meat and  processed meat you eat, such as hot dogs, sausage, bacon, and lunch meats. General instructions  Keep all follow-up visits as told by your health care provider. This is important. ? This includes having regularly scheduled colonoscopies. ? Talk to your health care provider about when you need a colonoscopy. Contact a health care provider if:  You have new or worsening bleeding during a bowel movement.  You have new or increased blood in your stool.  You have a change in bowel habits.  You lose weight for no known reason. Summary  Polyps are tissue growths inside the body. Polyps can grow in many places, including the colon.  Most colon polyps are noncancerous (benign), but some can become cancerous over time.  This condition is diagnosed with a colonoscopy.  Treatment for this condition involves removing any polyps that are found. Most polyps can be removed during a colonoscopy. This information  is not intended to replace advice given to you by your health care provider. Make sure you discuss any questions you have with your health care provider. Document Revised: 03/25/2018 Document Reviewed: 03/25/2018 Elsevier Patient Education  [place].     Diverticulosis  Diverticulosis is a condition that develops when small pouches (diverticula) form in the wall of the large intestine (colon). The colon is where water is absorbed and stool (feces) is formed. The pouches form when the inside layer of the colon pushes through weak spots in the outer layers of the colon. You may have a few pouches or many of them. The pouches usually do not cause problems unless they become inflamed or infected. When this happens, the condition is called diverticulitis. What are the causes? The cause of this condition is not known. What increases the risk? The following factors may make you more likely to develop this condition:  Being older than age 47. Your risk for this condition increases  with age. Diverticulosis is rare among people younger than age 18. By age 65, many people have it.  Eating a low-fiber diet.  Having frequent constipation.  Being overweight.  Not getting enough exercise.  Smoking.  Taking over-the-counter pain medicines, like aspirin and ibuprofen.  Having a family history of diverticulosis. What are the signs or symptoms? In most people, there are no symptoms of this condition. If you do have symptoms, they may include:  Bloating.  Cramps in the abdomen.  Constipation or diarrhea.  Pain in the lower left side of the abdomen. How is this diagnosed? Because diverticulosis usually has no symptoms, it is most often diagnosed during an exam for other colon problems. The condition may be diagnosed by:  Using a flexible scope to examine the colon (colonoscopy).  Taking an X-ray of the colon after dye has been put into the colon (barium enema).  Having a CT scan. How is this treated? You may not need treatment for this condition. Your health care provider may recommend treatment to prevent problems. You may need treatment if you have symptoms or if you previously had diverticulitis. Treatment may include:  Eating a high-fiber diet.  Taking a fiber supplement.  Taking a live bacteria supplement (probiotic).  Taking medicine to relax your colon. Follow these instructions at home: Medicines  Take over-the-counter and prescription medicines only as told by your health care provider.  If told by your health care provider, take a fiber supplement or probiotic. Constipation prevention Your condition may cause constipation. To prevent or treat constipation, you may need to:  Drink enough fluid to keep your urine pale yellow.  Take over-the-counter or prescription medicines.  Eat foods that are high in fiber, such as beans, whole grains, and fresh fruits and vegetables.  Limit foods that are high in fat and processed sugars, such as fried  or sweet foods.  General instructions  Try not to strain when you have a bowel movement.  Keep all follow-up visits as told by your health care provider. This is important. Contact a health care provider if you:  Have pain in your abdomen.  Have bloating.  Have cramps.  Have not had a bowel movement in 3 days. Get help right away if:  Your pain gets worse.  Your bloating becomes very bad.  You have a fever or chills, and your symptoms suddenly get worse.  You vomit.  You have bowel movements that are bloody or black.  You have bleeding from your rectum.  Summary  Diverticulosis is a condition that develops when small pouches (diverticula) form in the wall of the large intestine (colon).  You may have a few pouches or many of them.  This condition is most often diagnosed during an exam for other colon problems.  Treatment may include increasing the fiber in your diet, taking supplements, or taking medicines. This information is not intended to replace advice given to you by your health care provider. Make sure you discuss any questions you have with your health care provider. Document Revised: 07/07/2019 Document Reviewed: 07/07/2019 Elsevier Patient Education  [place] These instructions provide you with information about caring for yourself after your procedure. Your health care provider may also give you more specific instructions. Your treatment has been planned according to current medical practices, but problems sometimes occur. Call your health care provider if you have any problems or questions after your procedure. What can I expect after the procedure? After your procedure, you may:  Feel sleepy for several hours.  Feel clumsy and have poor balance for several hours.  Feel forgetful about what happened after the procedure.  Have poor judgment for several hours.  Feel nauseous or vomit.  Have a sore  throat if you had a breathing tube during the procedure. Follow these instructions at home: For at least 24 hours after the procedure:      Have a responsible adult stay with you. It is important to have someone help care for you until you are awake and alert.  Rest as needed.  Do not: ? Participate in activities in which you could fall or become injured. ? Drive. ? Use heavy machinery. ? Drink alcohol. ? Take sleeping pills or medicines that cause drowsiness. ? Make important decisions or sign legal documents. ? Take care of children on your own. Eating and drinking  Follow the diet that is recommended by your health care provider.  If you vomit, drink water, juice, or soup when you can drink without vomiting.  Make sure you have little or no nausea before eating solid foods. General instructions  Take over-the-counter and prescription medicines only as told by your health care provider.  If you have sleep apnea, surgery and certain medicines can increase your risk for breathing problems. Follow instructions from your health care provider about wearing your sleep device: ? Anytime you are sleeping, including during daytime naps. ? While taking prescription pain medicines, sleeping medicines, or medicines that make you drowsy.  If you smoke, do not smoke without supervision.  Keep all follow-up visits as told by your health care provider. This is important. Contact a health care provider if:  You keep feeling nauseous or you keep vomiting.  You feel light-headed.  You develop a rash.  You have a fever. Get help right away if:  You have trouble breathing. Summary  For several hours after your procedure, you may feel sleepy and have poor judgment.  Have a responsible adult stay with you for at least 24 hours or until you are awake and alert. This information is not intended to replace advice given to you by your health care provider. Make sure you discuss any  questions you have with your health care provider. Document Revised: 03/08/2018 Document Reviewed: 03/30/2016 Elsevier Patient Education  [place].

## 2020-02-16 NOTE — Transfer of Care (Signed)
Immediate Anesthesia Transfer of Care Note  Patient: [name]  Procedure(s) Performed: COLONOSCOPY WITH PROPOFOL (N/A ) ESOPHAGOGASTRODUODENOSCOPY (EGD) WITH PROPOFOL (N/A ) POLYPECTOMY  Patient Location: PACU  Anesthesia Type:General  Level of Consciousness: awake  Airway & Oxygen Therapy: Patient Spontanous Breathing  Post-op Assessment: Report given to RN  Post vital signs: Reviewed  Last Vitals:  Vitals Value Taken Time  BP 90/35 02/16/20 1516  Temp 36.3 C 02/16/20 1515  Pulse 82 02/16/20 1520  Resp 17 02/16/20 1520  SpO2 100 % 02/16/20 1520  Vitals shown include unvalidated device data.  Last Pain:  Vitals:   02/16/20 1301  TempSrc: Oral  PainSc: 0-No pain      Patients Stated Pain Goal: 5 ([id number])  Complications: No apparent anesthesia complications

## 2020-02-16 NOTE — Anesthesia Postprocedure Evaluation (Signed)
Anesthesia Post Note  Patient: KHANE PIERPONT  Procedure(s) Performed: COLONOSCOPY WITH PROPOFOL (N/A ) ESOPHAGOGASTRODUODENOSCOPY (EGD) WITH PROPOFOL (N/A ) POLYPECTOMY  Patient location during evaluation: Phase II Anesthesia Type: General Level of consciousness: awake and alert and patient cooperative Pain management: satisfactory to patient Vital Signs Assessment: post-procedure vital signs reviewed and stable Respiratory status: spontaneous breathing Postop Assessment: no apparent nausea or vomiting Anesthetic complications: no     Last Vitals:  Vitals:   02/16/20 1515 02/16/20 1553  BP: (!) 90/35 (!) 103/48  Pulse: 86 89  Resp:  16  Temp: (!) 36.3 C 36.7 C  SpO2: 100% 99%    Last Pain:  Vitals:   02/16/20 1553  TempSrc: Oral  PainSc: 0-No pain                 Kinjal Neitzke

## 2020-02-16 NOTE — Op Note (Signed)
[facility] Patient Name: [name] Procedure Date: 02/16/2020 2:37 PM MRN: [id number] Date of Birth: 08/03/1951 Attending MD: Gennette Pac , MD CSN: [id number] Age: 69 Admit Type: Outpatient Procedure:                Colonoscopy Indications:              Heme positive stool Providers:                Gennette Pac, MD, Jannett Celestine, RN, Burke Keels, Technician Referring MD:              Medicines:                Propofol per Anesthesia Complications:            No immediate complications. Estimated Blood Loss:     Estimated blood loss was minimal. Procedure:                Pre-Anesthesia Assessment:                           - Prior to the procedure, a History and Physical                            was performed, and patient medications and                            allergies were reviewed. The patient's tolerance of                            previous anesthesia was also reviewed. The risks                            and benefits of the procedure and the sedation                            options and risks were discussed with the patient.                            All questions were answered, and informed consent                            was obtained. Prior Anticoagulants: The patient has                            taken no previous anticoagulant or antiplatelet                            agents. ASA Grade Assessment: IV - A patient with                            severe systemic disease that is a constant threat  to life. After reviewing the risks and benefits,                            the patient was deemed in satisfactory condition to                            undergo the procedure.                           After obtaining informed consent, the colonoscope                            was passed under direct vision. Throughout the                            procedure, the patient's blood  pressure, pulse, and                            oxygen saturations were monitored continuously. The                            [id number]-[id number] ([id number]) scope was introduced through                            the anus and advanced to the the cecum, identified                            by appendiceal orifice and ileocecal valve. Scope In: 2:40:04 PM Scope Out: 3:05:15 PM Scope Withdrawal Time: 0 hours 12 minutes 48 seconds  Total Procedure Duration: 0 hours 25 minutes 11 seconds  Findings:      The perianal and digital rectal examinations were normal.      A 5 mm polyp was found in the cecum. The polyp was sessile. The polyp       was removed with a cold snare. Resection and retrieval were complete.       Estimated blood loss was minimal.      Scattered medium-mouthed diverticula were found in the sigmoid colon and       descending colon. Estimated blood loss was minimal.      The exam was otherwise without abnormality on direct and retroflexion       views. Impression:               - One 5 mm polyp in the cecum, removed with a cold                            snare. Resected and retrieved.                           - Diverticulosis in the sigmoid colon and in the                            descending colon.                           - The examination  was otherwise normal on direct                            and retroflexion views. Moderate Sedation:      Moderate (conscious) sedation was personally administered by an       anesthesia professional. The following parameters were monitored: oxygen       saturation, heart rate, blood pressure, respiratory rate, EKG, adequacy       of pulmonary ventilation, and response to care. Recommendation:           - Written discharge instructions were provided to                            the patient.                           - The signs and symptoms of potential delayed                            complications were discussed with the patient.                            - Patient has a contact number available for                            emergencies.                           - Return to normal activities tomorrow.                           Diet per peg tube; n.p.o. See EGD report. Procedure Code(s):        --- Professional ---                           [phone], Colonoscopy, flexible; with removal of                            tumor(s), polyp(s), or other lesion(s) by snare                            technique Diagnosis Code(s):        --- Professional ---                           K63.5, Polyp of colon                           R19.5, Other fecal abnormalities                           K57.30, Diverticulosis of large intestine without                            perforation or abscess without bleeding CPT copyright 2019 American Medical Association. All rights reserved. The codes documented in this report are preliminary and upon coder review may  be revised to meet current compliance requirements. [name]. [name], MD Gennette Pac, MD 02/16/2020 3:23:42 PM This report has been signed electronically. Number of Addenda: 0

## 2020-02-16 NOTE — H&P (Signed)
@LOGO @   Primary Care Physician:  [name] Norlander, DO Primary Gastroenterologist:  Dr. Gala Juarez  Pre-Procedure History & Physical: HPI:  [name] is a 69 y.o. male here for here for further evaluation of Hemoccult positive stool.  Of tonsillar cancer status post XRT.  Has a PEG tube.  Still unable to swallow even much saliva.  Patient is here for a colonoscopy and attempted EGD possible esophageal dilation as feasible/appropriate.  Past Medical History:  Diagnosis Date   HOH (hard of hearing)    Hyperlipidemia    Mass of oral cavity 05/12/2018   Positive FIT (fecal immunochemical test) 05/12/2018   PTSD (post-traumatic stress disorder)    has not been offically diagnosised   Tonsillar cancer ([place]) 06/02/2018    Past Surgical History:  Procedure Laterality Date   APPENDECTOMY  1969   COLONOSCOPY W/ POLYPECTOMY     remote past   ESOPHAGOGASTRODUODENOSCOPY  08/04/2018   Dr. Arnoldo Juarez: erythema of larynx, normal stomach, normal duodenum. PEG Placed 20 F, bumper at 2.5 cm   ESOPHAGOGASTRODUODENOSCOPY (EGD) WITH PROPOFOL N/A 08/04/2018   Procedure: ESOPHAGOGASTRODUODENOSCOPY (EGD) WITH PROPOFOL;  Surgeon: [name] Signs, Juarez;  [facility];  Service: Gastroenterology;  Laterality: N/A;   FINGER FRACTURE SURGERY Left    MULTIPLE EXTRACTIONS WITH ALVEOLOPLASTY N/A 06/15/2018   Procedure: Extraction of tooth #'s 2,12,13,18,20,and 21 with alveoloplasty and gross debridement of remaining teeth;  Surgeon: [name], DDS;  Location: [place];  Service: Oral Surgery;  Laterality: N/A;   PEG PLACEMENT N/A 08/04/2018   Procedure: PERCUTANEOUS ENDOSCOPIC GASTROSTOMY (PEG) PLACEMENT;  Surgeon: [name] Signs, Juarez;  [facility];  Service: Gastroenterology;  Laterality: N/A;   PORTACATH PLACEMENT Left 07/02/2018   Procedure: INSERTION PORT-A-CATH;  Surgeon: [name] Signs, Juarez;  Location: [facility];  Service: General;  Laterality: Left;    Prior to Admission medications    Medication Sig Start Date End Date Taking? Authorizing Provider  Ensure Plus (ENSURE PLUS) LIQD Give via PEG. 2 bottles in morning. 2 in afternoon. 1 before bed. Patient taking differently: Place 237 mLs into feeding tube 2 (two) times daily between meals.  03/22/19  Yes [name]  ibuprofen (ADVIL,MOTRIN) 200 MG tablet Take 400 mg by mouth every 6 (six) hours as needed for moderate pain.    Yes Provider, Historical, Juarez  levothyroxine (SYNTHROID) 25 MCG tablet TAKE 1 TABLET (25 MCG TOTAL) BY MOUTH DAILY BEFORE BREAKFAST. 02/15/20  Yes [name]  Nutritional Supplements (PROMOD) LIQD 30 mls (1 oz) via PEG. 1x/day Patient taking differently: Place 30 mLs into feeding tube daily. Boost 03/22/19  Yes [name]  OVER THE COUNTER MEDICATION Naked juice protein   Yes Provider, Historical, Juarez  OVER THE COUNTER MEDICATION Take 1 Bottle by mouth daily. [employer] Protein   Yes Provider, Historical, Juarez  OVER THE COUNTER MEDICATION Take 1 Bottle by mouth daily. Bolthouse protein   Yes Provider, Historical, Juarez  pentoxifylline (TRENTAL) 400 MG CR tablet Take 400 mg by mouth 3 (three) times daily. 01/04/20  Yes Provider, Historical, Juarez  polyethylene glycol-electrolytes (TRILYTE) 420 g solution Take 4,000 mLs by mouth as directed. 01/13/20  Yes [name], [name]  pregabalin (LYRICA) 150 MG capsule Take 150 mg by mouth 2 (two) times daily. Feeding tube 01/04/20  Yes Provider, Historical, Juarez  polyethylene glycol-electrolytes (TRILYTE) 420 g solution Take 4,000 mLs by mouth as directed. Patient not taking: Reported on 02/07/2020 11/14/19   [name]    Allergies as of 11/14/2019   (No Known Allergies)    Family History  Problem Relation Age of Onset   Diabetes Father        died at age 53    Colon cancer Neg Hx     Social History   Socioeconomic History   Marital status: Divorced    Spouse name: Not on file   Number of children: Not on file   Years  of education: Not on file   Highest education level: Not on file  Occupational History   Occupation: veteran, retired  Tobacco Use   Smoking status: Former Smoker    Years: 15.00    Types: Cigarettes    Quit date: 2000    Years since quitting: 21.1   Smokeless tobacco: Never Used  Substance and Sexual Activity   Alcohol use: Not Currently    Alcohol/week: 12.0 standard drinks    Types: 12 Cans of beer per week   Drug use: Never   Sexual activity: Not Currently  Other Topics Concern   Not on file  Social History Narrative   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  He lives independently with his dog here in [place].  He formally lived in [place].   Social Determinants of Health   Financial Resource Strain:    Difficulty of Paying Living Expenses: Not on file  Food Insecurity:    Worried About Charity fundraiser in the Last Year: Not on file   [employer] of Food in the Last Year: Not on file  Transportation Needs:    Lack of Transportation (Medical): Not on file   Lack of Transportation (Non-Medical): Not on file  Physical Activity:    Days of Exercise per Week: Not on file   Minutes of Exercise per Session: Not on file  Stress:    Feeling of Stress : Not on file  Social Connections:    Frequency of Communication with Friends and Family: Not on file   Frequency of Social Gatherings with Friends and Family: Not on file   Attends Religious Services: Not on file   Active Member of Clubs or Organizations: Not on file   Attends Archivist Meetings: Not on file   Marital Status: Not on file  Intimate Partner Violence:    Fear of Current or Ex-Partner: Not on file   Emotionally Abused: Not on file   Physically Abused: Not on file   Sexually Abused: Not on file    Review of Systems: See HPI, otherwise negative ROS  Physical Exam: There were no vitals taken for this visit. Neck:  Supple; no masses or thyromegaly. No significant cervical  adenopathy.  Chronic radiation changes. Lungs:  Clear throughout to auscultation.   No wheezes, crackles, or rhonchi. No acute distress. Heart:  Regular rate and rhythm; no murmurs, clicks, rubs,  or gallops. Abdomen: Non-distended, normal bowel sounds.  Soft and nontender without appreciable mass or hepatosplenomegaly.  PEG tube Pulses:  Normal pulses noted. Extremities:  Without clubbing or edema.  Impression/Plan: Hemoccult positive stool oropharyngeal/esophageal dysphagia in setting of XRT for head neck cancer.  Long discussion about feasibility of an EGD today.  I told patient may or may not be technically possible to get him scoped and dilated.  We will do what is best.  He is also here for colonoscopy to further evaluate Hemoccult positive stool.  He took the prep through the PEG  tube.  The risks, benefits, limitations, imponderables and alternatives regarding both EGD and colonoscopy have been reviewed with the patient. Questions have been answered. All parties agreeable.       Notice: This dictation was prepared with Dragon dictation along with smaller phrase technology. Any transcriptional errors that result from this process are unintentional and may not be corrected upon review.

## 2020-02-16 NOTE — Progress Notes (Signed)
Patient performed self oral care with a oral swab, tolerating po ice chips, no further blood noted when patient spits on command. Patient waiting for ride to arrive.  Sitting in chair with callbell in reach.

## 2020-02-20 ENCOUNTER — Encounter: Payer: Self-pay | Admitting: Internal Medicine

## 2020-02-20 LAB — SURGICAL PATHOLOGY

## 2020-02-22 ENCOUNTER — Telehealth: Payer: Self-pay | Admitting: Family Medicine

## 2020-02-22 NOTE — Op Note (Signed)
[facility] Patient Name: [name] Procedure Date: 02/16/2020 1:53 PM MRN: [id number] Date of Birth: [birth year] Attending MD: Gennette Pac , MD CSN: [id number] Age: 69 Admit Type: Outpatient Procedure:                Upper GI endoscopy Indications:              Dysphagia, Heme positive stool Providers:                Gennette Pac, MD, Jannett Celestine, RN, Burke Keels, Technician Referring MD:              Medicines:                Propofol per Anesthesia Complications:            No immediate complications. Estimated Blood Loss:     Estimated blood loss was minimal. Procedure:                Pre-Anesthesia Assessment:                           - Prior to the procedure, a History and Physical                            was performed, and patient medications and                            allergies were reviewed. The patient's tolerance of                            previous anesthesia was also reviewed. The risks                            and benefits of the procedure and the sedation                            options and risks were discussed with the patient.                            All questions were answered, and informed consent                            was obtained. Prior Anticoagulants: The patient has                            taken no previous anticoagulant or antiplatelet                            agents. ASA Grade Assessment: IV - A patient with                            severe systemic disease that is a constant threat  to life. After reviewing the risks and benefits,                            the patient was deemed in satisfactory condition to                            undergo the procedure.                           After obtaining informed consent, the endoscope was                            passed under direct vision. Throughout the                            procedure, the  patient's blood pressure, pulse, and                            oxygen saturations were monitored continuously. The                            procedure was aborted. The scope was not inserted.                            Medications were given. The procedure was aborted                            due to abnormal anatomy. Scope In: 2:22:42 PM Scope Out: 2:34:11 PM Total Procedure Duration: 0 hours 11 minutes 29 seconds  Findings:      Patient with hypopharyngeal radiation changes and neovascularization.       Utilizing the slim scope, edematous appearing arytenoids cricopharyngeal       opening identified hypopharyngeal opening not apparent.. Unable to find       an esophageal inlet. Patient would swallow on command but would. Gentle       forward pressure applied did not help. Minimal bleeding. Was unable to       intubate esophagus to carry out examination. Impression:               - The procedure was aborted due to abnormal                            anatomy. Radiation changes of the hypopharynx upper                            esophageal basin and completion of procedure.                           - No specimens collected. Moderate Sedation:      Moderate (conscious) sedation was personally administered by an       anesthesia professional. The following parameters were monitored: oxygen       saturation, heart rate, blood pressure, and response to care. Recommendation:           - Patient has a contact number available for  emergencies. The signs and symptoms of potential                            delayed complications were discussed with the                            patient. Return to normal activities tomorrow.                            Written discharge instructions were provided to the                            patient. Continue to use PEG tube. Follow-up with                            your ENT doctor soon as possible. See colonoscopy                             report. Procedure Code(s):         Diagnosis Code(s):        --- Professional ---                           R13.10, Dysphagia, unspecified                           R19.5, Other fecal abnormalities CPT copyright 2019 American Medical Association. All rights reserved. The codes documented in this report are preliminary and upon coder review may  be revised to meet current compliance requirements. [name]. Daziyah Cogan, MD Gennette Pac, MD 02/22/2020 2:31:21 PM This report has been signed electronically. Number of Addenda: 0

## 2020-02-23 ENCOUNTER — Encounter: Payer: Self-pay | Admitting: Nurse Practitioner

## 2020-02-23 ENCOUNTER — Other Ambulatory Visit: Payer: Self-pay

## 2020-02-23 ENCOUNTER — Ambulatory Visit (INDEPENDENT_AMBULATORY_CARE_PROVIDER_SITE_OTHER): Payer: Medicare Other | Admitting: Nurse Practitioner

## 2020-02-23 VITALS — BP 112/61 | HR 89 | Temp 97.7°F | Ht 68.0 in | Wt 142.0 lb

## 2020-02-23 DIAGNOSIS — R1310 Dysphagia, unspecified: Secondary | ICD-10-CM | POA: Diagnosis not present

## 2020-02-23 DIAGNOSIS — R1319 Other dysphagia: Secondary | ICD-10-CM

## 2020-02-23 DIAGNOSIS — R195 Other fecal abnormalities: Secondary | ICD-10-CM

## 2020-02-23 NOTE — Progress Notes (Signed)
Referring Provider: [name], DO Primary Care Physician:  [name], DO Primary GI:  Dr. Gala Romney  Chief Complaint  Patient presents with  . Dysphagia    ED 02/16/2020. Still having trouble. EGD was attempted but aborted    HPI:   [name] is a 69 y.o. male who presents for follow-up on dysphagia.  The patient was last seen in our office 10/27/2019 for positive FIT test and dysphagia.  Previous episodes of heme positive stool and colonoscopy postponed till March/April 2020 per patient request.  Diagnosed with tonsillar cancer in June 2019 with weight loss and severe mucositis during treatment that required a PEG tube placement in August 2019.  Last colonoscopy was 15 years prior.  Chemoradiation finished in September/October 2019 but still with some dysphagia symptoms that are slowly improving.  Still on tube feeds.  Despite completion of therapy in 2019 oncology noted in 2020 that the patient still not eating by mouth and using PEG tube with 2 cans of Osmolite daily and drinking 1 can of Ensure.  Stabilized dysphagia.  PET scan showed resolution of left tonsillar mass and neck adenopathy with no current findings of active malignancy.  He had been referred to surgery previously for abnormal appearance of PEG tube.  At his last visit he noted he is not able to swallow anything, has SLP evaluation and likely need for EGD with UES dilation.  Seeing PT for neck therapy/movement.  No other overt GI complaints.  Recommended colonoscopy and upper endoscopy, continued PEG use for now, follow-up based on recommendations.  Colonoscopy completed 02/16/2020 which found a single 5 mm polyp in the cecum, diverticulosis in the sigmoid colon and descending colon, otherwise normal.  Surgical pathology found the polyp to be tubular adenoma and recommended no future colonoscopy unless new symptoms develop.  EGD completed the same day was aborted due to abnormal anatomy with radiation changes  of the hypopharynx under esophageal basin.  Recommended continue PEG tube, follow-up with ENT as soon as possible.  Today he is doing ok overall. His is still having significant dysphagia. He is frustrated with his symptoms. He isn't sure if he has an appointment with Dr. Benjamine Mola (ENT). Denies significant abdominal pain, PEG still working. Denies nausea, hematochezia, melena, fever, chills. Denies URI or flu-like symptoms. Denies loss of sense of taste or smell. COVID-19 test was negative prior to procedure. Denies chest pain, dyspnea, dizziness, lightheadedness, syncope, near syncope. Denies any other upper or lower GI symptoms.  He is asking about COVID-19 vaccine and a recommended he be vaccinated due to his high risk.  NOTE: a total of 30 minutes was spent on the patient's care today including education care coordination, and grief support.  Past Medical History:  Diagnosis Date  . HOH (hard of hearing)   . Hyperlipidemia   . Mass of oral cavity 05/12/2018  . Positive FIT (fecal immunochemical test) 05/12/2018  . PTSD (post-traumatic stress disorder)    has not been offically diagnosised  . Tonsillar cancer ([place]) 06/02/2018    Past Surgical History:  Procedure Laterality Date  . APPENDECTOMY  1969  . COLONOSCOPY W/ POLYPECTOMY     remote past  . ESOPHAGOGASTRODUODENOSCOPY  08/04/2018   Dr. Arnoldo Morale: erythema of larynx, normal stomach, normal duodenum. PEG Placed 20 F, bumper at 2.5 cm  . ESOPHAGOGASTRODUODENOSCOPY (EGD) WITH PROPOFOL N/A 08/04/2018   Procedure: ESOPHAGOGASTRODUODENOSCOPY (EGD) WITH PROPOFOL;  Surgeon: Aviva Signs, MD;  [facility];  Service: Gastroenterology;  Laterality: N/A;  . FINGER FRACTURE SURGERY Left   . MULTIPLE EXTRACTIONS WITH ALVEOLOPLASTY N/A 06/15/2018   Procedure: Extraction of tooth #'s 2,12,13,18,20,and 21 with alveoloplasty and gross debridement of remaining teeth;  Surgeon: Lenn Cal, DDS;  Location: [place];  Service: Oral Surgery;   Laterality: N/A;  . PEG PLACEMENT N/A 08/04/2018   Procedure: PERCUTANEOUS ENDOSCOPIC GASTROSTOMY (PEG) PLACEMENT;  Surgeon: Aviva Signs, MD;  [facility];  Service: Gastroenterology;  Laterality: N/A;  . PORTACATH PLACEMENT Left 07/02/2018   Procedure: INSERTION PORT-A-CATH;  Surgeon: Aviva Signs, MD;  Location: [facility];  Service: General;  Laterality: Left;    Current Outpatient Medications  Medication Sig Dispense Refill  . Ensure Plus (ENSURE PLUS) LIQD Give via PEG. 2 bottles in morning. 2 in afternoon. 1 before bed. (Patient taking differently: Place 237 mLs into feeding tube 2 (two) times daily between meals. )  0  . ibuprofen (ADVIL,MOTRIN) 200 MG tablet Take 400 mg by mouth every 6 (six) hours as needed for moderate pain.     Marland Kitchen levothyroxine (SYNTHROID) 25 MCG tablet TAKE 1 TABLET (25 MCG TOTAL) BY MOUTH DAILY BEFORE BREAKFAST. 30 tablet 5  . Nutritional Supplements (PROMOD) LIQD 30 mls (1 oz) via PEG. 1x/day (Patient taking differently: Place 30 mLs into feeding tube daily. Boost)    . OVER THE COUNTER MEDICATION Naked juice protein    . OVER THE COUNTER MEDICATION Take 1 Bottle by mouth daily. [employer] Protein    . OVER THE COUNTER MEDICATION Take 1 Bottle by mouth daily. Bolthouse protein    . pregabalin (LYRICA) 150 MG capsule Take 150 mg by mouth 2 (two) times daily. Feeding tube     No current facility-administered medications for this visit.    Allergies as of 02/23/2020  . (No Known Allergies)    Family History  Problem Relation Age of Onset  . Diabetes Father        died at age 37   . Colon cancer Neg Hx     Social History   Socioeconomic History  . Marital status: Divorced    Spouse name: Not on file  . Number of children: Not on file  . Years of education: Not on file  . Highest education level: Not on file  Occupational History  . Occupation: veteran, retired  Tobacco Use  . Smoking status: Former Smoker    Years: 15.00    Types:  Cigarettes    Quit date: 2000    Years since quitting: 21.1  . Smokeless tobacco: Never Used  Substance and Sexual Activity  . Alcohol use: Not Currently    Alcohol/week: 12.0 standard drinks    Types: 12 Cans of beer per week  . Drug use: Never  . Sexual activity: Not Currently  Other Topics Concern  . 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  He formally lived in [place].   Social Determinants of Health   Financial Resource Strain:   . Difficulty of Paying Living Expenses: Not on file  Food Insecurity:   . Worried About Charity fundraiser in the Last Year: Not on file  . Ran Out of Food in the Last Year: Not on file  Transportation Needs:   . Lack of Transportation (Medical): Not on file  . Lack of Transportation (Non-Medical): Not on file  Physical  Activity:   . Days of Exercise per Week: Not on file  . Minutes of Exercise per Session: Not on file  Stress:   . Feeling of Stress : Not on file  Social Connections:   . Frequency of Communication with Friends and Family: Not on file  . Frequency of Social Gatherings with Friends and Family: Not on file  . Attends Religious Services: Not on file  . Active Member of Clubs or Organizations: Not on file  . Attends Archivist Meetings: Not on file  . Marital Status: Not on file    Review of Systems: General: Negative for anorexia, weight loss, fever, chills, fatigue, weakness. ENT: Negative for hoarseness, difficulty swallowing. CV: Negative for chest pain, angina, palpitations, peripheral edema.  Respiratory: Negative for dyspnea at rest, cough, sputum, wheezing.  GI: See history of present illness. Endo: Negative for unusual weight change.  Heme: Negative for bruising or bleeding. Allergy: Negative for rash or hives.   Physical Exam: BP 112/61   Pulse 89   Temp 97.7 F (36.5 C) (Oral)   Ht 5'  8" (1.727 m)   Wt 142 lb (64.4 kg)   BMI 21.59 kg/m  General:   Alert and oriented. Pleasant and cooperative. Well-nourished and well-developed.  Eyes:  Without icterus, sclera clear and conjunctiva pink.  Ears:  Normal auditory acuity. Cardiovascular:  S1, S2 present without murmurs appreciated. Extremities without clubbing or edema. Respiratory:  Clear to auscultation bilaterally. No wheezes, rales, or rhonchi. No distress.  Gastrointestinal:  +BS, soft, non-tender and non-distended. No HSM noted. No guarding or rebound. No masses appreciated. PEG tube noted mid-abdomen with mild drainage, non-tender. Rectal:  Deferred  Musculoskalatal:  Symmetrical without gross deformities. Skin:  Skin radiation changes left upper chest Neurologic:  Alert and oriented x4;  grossly normal neurologically. Psych:  Alert and cooperative. Normal mood and affect. Heme/Lymph/Immune: No excessive bruising noted.    02/23/2020 11:24 AM   Disclaimer: This note was dictated with voice recognition software. Similar sounding words can inadvertently be transcribed and may not be corrected upon review.

## 2020-02-23 NOTE — Assessment & Plan Note (Signed)
Positive fit test, colonoscopy updated 02/16/2020 with a single polyp found to be tubular adenoma and recommended no further colonoscopy unless new symptoms develop.  Continue to monitor for any overt GI bleed.  Follow-up in 6 months.

## 2020-02-23 NOTE — Assessment & Plan Note (Signed)
Unfortunate situation of a patient with tonsillar cancer status post chemoradiation and substantial radiation damage in his throat area.  Has had difficulty, and off PEG tube as he is not able to swallow foods or even oftentimes liquids.  EGD completed that had to be aborted due to abnormal anatomy with radiation changes of the hypopharynx.  Recommended continue PEG tube use for nutrition and hydration, follow-up with a ENT as soon as possible.  He is not sure if he has an appointment with his ENT physician, we will provide the phone number so he can call make the appointment.  I recommended he see them as soon as possible.  Continue to follow-up with oncology.  We will send the EGD report to oncology further information.  Follow-up in our office in 6 months.

## 2020-02-23 NOTE — Patient Instructions (Signed)
Your health issues we discussed today were:   Dysphagia (swallowing difficulty): 1. As we reviewed, we are unable to complete your upper endoscopy due to the significant radiation damage 2. See Dr. Benjamine Mola with ENT as soon as possible 3. His phone number is [phone] 4. Continue to follow-up with oncology 5. We will send your upper endoscopy report to your oncologist so they will have it on file 6. Continue to use your PEG tube for now.  Call for any problems with your PEG tube  Overall I recommend:  1. Continue your other current medications 2. Return for follow-up in 6 months 3. Call if you have any questions or concerns 4. As discussed, I highly recommend you receive the COVID-19 vaccine due to your high risk status.  Information on scheduling below.   ---------------------------------------------------------------  COVID-19 Vaccine Information can be found at: ShippingScam.co.uk For questions related to vaccine distribution or appointments, please email [email or web address] or call [phone].   ---------------------------------------------------------------   At [facility] we value your feedback. You may receive a survey about your visit today. Please share your experience as we strive to create trusting relationships with our patients to provide genuine, compassionate, quality care.  We appreciate your understanding and patience as we review any laboratory studies, imaging, and other diagnostic tests that are ordered as we care for you. Our office policy is 5 business days for review of these results, and any emergent or urgent results are addressed in a timely manner for your best interest. If you do not hear from our office in 1 week, please contact us.   We also encourage the use of MyChart, which contains your medical information for your review as well. If you are not enrolled in this feature, an  access code is on this after visit summary for your convenience. Thank you for allowing [place] to be involved in your care.  It was great to see you today!  I hope you have a great day!!

## 2020-03-06 ENCOUNTER — Other Ambulatory Visit (HOSPITAL_COMMUNITY): Payer: Self-pay | Admitting: *Deleted

## 2020-03-06 DIAGNOSIS — D708 Other neutropenia: Secondary | ICD-10-CM

## 2020-03-06 DIAGNOSIS — C099 Malignant neoplasm of tonsil, unspecified: Secondary | ICD-10-CM

## 2020-03-07 ENCOUNTER — Inpatient Hospital Stay (HOSPITAL_COMMUNITY): Payer: Medicare Other | Attending: Hematology

## 2020-03-07 ENCOUNTER — Other Ambulatory Visit: Payer: Self-pay

## 2020-03-07 ENCOUNTER — Ambulatory Visit (HOSPITAL_COMMUNITY)
Admission: RE | Admit: 2020-03-07 | Discharge: 2020-03-07 | Disposition: A | Discharge status: Home / Self Care | Payer: Medicare Other | Source: Ambulatory Visit | Attending: Hematology | Admitting: Hematology

## 2020-03-07 DIAGNOSIS — Z79899 Other long term (current) drug therapy: Secondary | ICD-10-CM | POA: Diagnosis not present

## 2020-03-07 DIAGNOSIS — D708 Other neutropenia: Secondary | ICD-10-CM

## 2020-03-07 DIAGNOSIS — R42 Dizziness and giddiness: Secondary | ICD-10-CM | POA: Diagnosis not present

## 2020-03-07 DIAGNOSIS — E785 Hyperlipidemia, unspecified: Secondary | ICD-10-CM | POA: Insufficient documentation

## 2020-03-07 DIAGNOSIS — Z87891 Personal history of nicotine dependence: Secondary | ICD-10-CM | POA: Insufficient documentation

## 2020-03-07 DIAGNOSIS — D61818 Other pancytopenia: Secondary | ICD-10-CM | POA: Insufficient documentation

## 2020-03-07 DIAGNOSIS — E039 Hypothyroidism, unspecified: Secondary | ICD-10-CM | POA: Insufficient documentation

## 2020-03-07 DIAGNOSIS — C099 Malignant neoplasm of tonsil, unspecified: Secondary | ICD-10-CM | POA: Insufficient documentation

## 2020-03-07 LAB — CBC WITH DIFFERENTIAL/PLATELET
Abs Immature Granulocytes: 0 10*3/uL (ref 0.00–0.07)
Basophils Absolute: 0 10*3/uL (ref 0.0–0.1)
Basophils Relative: 1 %
Eosinophils Absolute: 0 10*3/uL (ref 0.0–0.5)
Eosinophils Relative: 2 %
HCT: 36.9 % — ABNORMAL LOW (ref 39.0–52.0)
Hemoglobin: 12 g/dL — ABNORMAL LOW (ref 13.0–17.0)
Immature Granulocytes: 0 %
Lymphocytes Relative: 25 %
Lymphs Abs: 0.4 10*3/uL — ABNORMAL LOW (ref 0.7–4.0)
MCH: 32.1 pg (ref 26.0–34.0)
MCHC: 32.5 g/dL (ref 30.0–36.0)
MCV: 98.7 fL (ref 80.0–100.0)
Monocytes Absolute: 0.2 10*3/uL (ref 0.1–1.0)
Monocytes Relative: 14 %
Neutro Abs: 0.8 10*3/uL — ABNORMAL LOW (ref 1.7–7.7)
Neutrophils Relative %: 58 %
Platelets: 139 10*3/uL — ABNORMAL LOW (ref 150–400)
RBC: 3.74 MIL/uL — ABNORMAL LOW (ref 4.22–5.81)
RDW: 13.5 % (ref 11.5–15.5)
WBC: 1.5 10*3/uL — ABNORMAL LOW (ref 4.0–10.5)
nRBC: 0 % (ref 0.0–0.2)

## 2020-03-07 LAB — COMPREHENSIVE METABOLIC PANEL
ALT: 17 U/L (ref 0–44)
AST: 32 U/L (ref 15–41)
Albumin: 3.9 g/dL (ref 3.5–5.0)
Alkaline Phosphatase: 52 U/L (ref 38–126)
Anion gap: 9 (ref 5–15)
BUN: 18 mg/dL (ref 8–23)
CO2: 30 mmol/L (ref 22–32)
Calcium: 9.2 mg/dL (ref 8.9–10.3)
Chloride: 98 mmol/L (ref 98–111)
Creatinine, Ser: 0.82 mg/dL (ref 0.61–1.24)
GFR calc Af Amer: >60 mL/min (ref >60)
GFR calc non Af Amer: >60 mL/min (ref >60)
Glucose, Bld: 102 mg/dL — ABNORMAL HIGH (ref 70–99)
Potassium: 3.9 mmol/L (ref 3.5–5.1)
Sodium: 137 mmol/L (ref 135–145)
Total Bilirubin: 0.9 mg/dL (ref 0.3–1.2)
Total Protein: 6.7 g/dL (ref 6.5–8.1)

## 2020-03-07 LAB — TSH: TSH: 9.419 u[IU]/mL — ABNORMAL HIGH (ref 0.350–4.500)

## 2020-03-07 MED ORDER — IOHEXOL 300 MG/ML  SOLN
75.0000 mL | Freq: Once | INTRAMUSCULAR | Status: AC | PRN
  Administered 2020-03-07: 75 mL via INTRAVENOUS

## 2020-03-12 ENCOUNTER — Other Ambulatory Visit (HOSPITAL_COMMUNITY): Payer: Self-pay | Admitting: *Deleted

## 2020-03-12 NOTE — Progress Notes (Signed)
I received a call today from [facility] and they want patient to follow up with our nutritionist.  I have sent a message to [facility] and scheduled patient with her.

## 2020-03-14 ENCOUNTER — Inpatient Hospital Stay (HOSPITAL_BASED_OUTPATIENT_CLINIC_OR_DEPARTMENT_OTHER): Payer: Medicare Other | Admitting: Hematology

## 2020-03-14 ENCOUNTER — Other Ambulatory Visit: Payer: Self-pay

## 2020-03-14 ENCOUNTER — Encounter (HOSPITAL_COMMUNITY): Payer: Self-pay | Admitting: Hematology

## 2020-03-14 VITALS — BP 115/90 | HR 85 | Temp 98.0°F | Resp 16 | Wt 143.0 lb

## 2020-03-14 DIAGNOSIS — C099 Malignant neoplasm of tonsil, unspecified: Secondary | ICD-10-CM | POA: Diagnosis not present

## 2020-03-14 MED ORDER — LEVOTHYROXINE SODIUM 50 MCG PO TABS: 25.0000 ug | ORAL_TABLET | Freq: Every day | ORAL | 3 refills | Status: DC

## 2020-03-14 NOTE — Patient Instructions (Addendum)
[facility] at [facility] Discharge Instructions  You were seen today by Dr. Katragadda. He went over your recent lab and scan results. He will see you back in 3 months for labs and follow up.   Thank you for choosing [facility] at [facility] to provide your oncology and hematology care.  To afford each patient quality time with our provider, please arrive at least 15 minutes before your scheduled appointment time.   If you have a lab appointment with the Cancer Center please come in thru the  Main Entrance and check in at the main information desk  You need to re-schedule your appointment should you arrive 10 or more minutes late.  We strive to give you quality time with our providers, and arriving late affects you and other patients whose appointments are after yours.  Also, if you no show three or more times for appointments you may be dismissed from the clinic at the providers discretion.     Again, thank you for choosing [facility].  Our hope is that these requests will decrease the amount of time that you wait before being seen by our physicians.       _____________________________________________________________  Should you have questions after your visit to [facility], please contact our office at [phone] between the hours of 8:00 a.m. and 4:30 p.m.  Voicemails left after 4:00 p.m. will not be returned until the following business day.  For prescription refill requests, have your pharmacy contact our office and allow 72 hours.    Cancer Center Support Programs:   > Cancer Support Group  2nd Tuesday of the month 1pm-2pm, Journey Room    

## 2020-03-14 NOTE — Assessment & Plan Note (Addendum)
1.  Left tonsil squamous cell carcinoma, stage IVa (T3 N2 M0): -3 cycles of carboplatin and 5-FU with radiation from 07/13/2018 through 09/13/2018. -Posttreatment PET scan on 02/07/2019 showed resolution of the left tonsillar mass and neck adenopathy with no active malignancy. -We reviewed results of CT soft tissue neck from 03/07/2020 which did not show any evidence of residual or recurrent disease.  Posttreatment changes in the oropharynx and hypopharynx. -EGD on 02/16/2020 was aborted secondary to radiation changes of the hypopharynx.  Utilizing the slim scope, edematous appearing arytenoids, cricopharyngeal opening identified, hypopharyngeal opening not apparent.  Unable to find the esophageal inlet.  Procedure was abandoned as unable to intubate esophagus. -Physical exam shows thickening and fibrosis of the neck with no clear palpable adenopathy. -We plan to repeat another CT scan in 6 months.  2.  Hypothyroidism: -He is taking Synthroid 25 mcg daily. -His TSH today is 9.4 improved from 11.8 previously. -We will increase his Synthroid to 50 mcg daily.  3.  Nutrition: -He cannot swallow by mouth.  He is continuing to use PEG tube with 2 cans of Ensure twice daily.  4.  Pancytopenia: -He has severe leukopenia and neutropenia even prior to start of therapy.  Blood counts have slightly worsened. -CBC on 03/05/2016 showed white count 1.5 with ANC of 800.  Platelet count is 139. -We will plan to repeat his CBC, and check for nutritional deficiencies in 3 months and follow-up with Randi. -If he has any worsening with neutrophil count less than 500, no other correctable causes we will consider bone marrow biopsy.  5.  Neuropathic pains: -He has neuropathic pains of the shoulder.  He was started on Lyrica 150 mg twice daily but Dr.Yanagihara in January of this year.  This is helping the pains.

## 2020-03-14 NOTE — Progress Notes (Signed)
[place] [place], [place] [address]   CLINIC:  Medical Oncology/Hematology  PCP:  [name], DO [place] [address] [phone]   REASON FOR VISIT:  Follow-up for tonsillar cancer  CURRENT THERAPY:Observation per NCCN guidelines   BRIEF ONCOLOGIC HISTORY:  Oncology History  Tonsillar cancer ([place])  06/02/2018 Initial Diagnosis   Tonsillar cancer ([place])   06/15/2018 -  Chemotherapy   The patient had palonosetron (ALOXI) injection 0.25 mg, 0.25 mg, Intravenous,  Once, 3 of 3 cycles Administration: 0.25 mg (07/13/2018), 0.25 mg (07/15/2018), 0.25 mg (08/10/2018), 0.25 mg (09/13/2018) CARBOplatin (PARAPLATIN) 140 mg in sodium chloride 0.9 % 100 mL chemo infusion, 70 mg/m2 = 140 mg (100 % of original dose 70 mg/m2), Intravenous,  Once, 3 of 3 cycles Dose modification: 70 mg/m2 (original dose 70 mg/m2, Cycle 1, Reason: Other (see comments), Comment: french protocol head and neck), 52.5 mg/m2 (75 % of original dose 70 mg/m2, Cycle 3, Reason: Provider Judgment) Administration: 140 mg (07/13/2018), 140 mg (07/14/2018), 140 mg (07/15/2018), 140 mg (07/19/2018), 140 mg (08/10/2018), 140 mg (08/11/2018), 140 mg (08/12/2018), 140 mg (08/13/2018), 140 mg (09/13/2018), 140 mg (09/14/2018), 100 mg (09/15/2018), 100 mg (09/16/2018) fluorouracil (ADRUCIL) 4,750 mg in sodium chloride 0.9 % 55 mL chemo infusion, 600 mg/m2/day = 4,750 mg (100 % of original dose 600 mg/m2/day), Intravenous, 4D (96 hours ), 3 of 3 cycles Dose modification: 600 mg/m2/day (original dose 600 mg/m2/day, Cycle 1, Reason: Other (see comments), Comment: french protocol head and neck) Administration: 4,750 mg (07/13/2018), 4,750 mg (08/10/2018), 4,750 mg (09/13/2018)  for chemotherapy treatment.       CANCER STAGING: Cancer Staging Tonsillar cancer ([place]) Staging form: Pharynx - HPV-Mediated Oropharynx, AJCC 8th Edition - Clinical stage from 06/02/2018: Stage II (cT3, cN2, cM0) -  Unsigned    INTERVAL HISTORY:  Mr. Canela 69 y.o. male seen for follow-up of tonsillar cancer and hypothyroidism.  He still cannot swallow.  He is using feeding tube.  He takes in 2 cans of Ensure twice daily.  Reports appetite 75%.  Energy levels are 25%.  Denies any fevers, night sweats or weight loss.  Denies any infections in the past 6 months.  Reports occasional dizziness.  No nausea, vomiting or diarrhea.    REVIEW OF SYSTEMS:  Review of Systems  Neurological: Positive for dizziness and numbness.  All other systems reviewed and are negative.    PAST MEDICAL/SURGICAL HISTORY:  Past Medical History:  Diagnosis Date  . HOH (hard of hearing)   . Hyperlipidemia   . Mass of oral cavity 05/12/2018  . Positive FIT (fecal immunochemical test) 05/12/2018  . PTSD (post-traumatic stress disorder)    has not been offically diagnosised  . Tonsillar cancer ([place]) 06/02/2018   Past Surgical History:  Procedure Laterality Date  . APPENDECTOMY  1969  . COLONOSCOPY W/ POLYPECTOMY     remote past  . COLONOSCOPY WITH PROPOFOL N/A 02/16/2020   Procedure: COLONOSCOPY WITH PROPOFOL;  Surgeon: Daneil Dolin, MD;  [facility];  Service: Endoscopy;  Laterality: N/A;  1:45pm  . ESOPHAGOGASTRODUODENOSCOPY  08/04/2018   Dr. Arnoldo Morale: erythema of larynx, normal stomach, normal duodenum. PEG Placed 20 F, bumper at 2.5 cm  . ESOPHAGOGASTRODUODENOSCOPY (EGD) WITH PROPOFOL N/A 08/04/2018   Procedure: ESOPHAGOGASTRODUODENOSCOPY (EGD) WITH PROPOFOL;  Surgeon: Aviva Signs, MD;  [facility];  Service: Gastroenterology;  Laterality: N/A;  . ESOPHAGOGASTRODUODENOSCOPY (EGD) WITH PROPOFOL N/A 02/16/2020   Procedure: ESOPHAGOGASTRODUODENOSCOPY (EGD)  WITH PROPOFOL;  Surgeon: Daneil Dolin, MD;  [facility];  Service: Endoscopy;  Laterality: N/A;  . FINGER FRACTURE SURGERY Left   . MULTIPLE EXTRACTIONS WITH ALVEOLOPLASTY N/A 06/15/2018   Procedure: Extraction of tooth #'s  2,12,13,18,20,and 21 with alveoloplasty and gross debridement of remaining teeth;  Surgeon: Lenn Cal, DDS;  Location: [facility];  Service: Oral Surgery;  Laterality: N/A;  . PEG PLACEMENT N/A 08/04/2018   Procedure: PERCUTANEOUS ENDOSCOPIC GASTROSTOMY (PEG) PLACEMENT;  Surgeon: Aviva Signs, MD;  [facility];  Service: Gastroenterology;  Laterality: N/A;  . POLYPECTOMY  02/16/2020   Procedure: POLYPECTOMY;  Surgeon: Daneil Dolin, MD;  [facility];  Service: Endoscopy;;  . PORTACATH PLACEMENT Left 07/02/2018   Procedure: INSERTION PORT-A-CATH;  Surgeon: Aviva Signs, MD;  Location: [facility];  Service: General;  Laterality: Left;     SOCIAL HISTORY:  Social History   Socioeconomic History  . Marital status: Divorced    Spouse name: Not on file  . Number of children: Not on file  . Years of education: Not on file  . Highest education level: Not on file  Occupational History  . Occupation: veteran, retired  Tobacco Use  . Smoking status: Former Smoker    Years: 15.00    Types: Cigarettes    Quit date: 2000    Years since quitting: 21.2  . Smokeless tobacco: Never Used  Substance and Sexual Activity  . Alcohol use: Not Currently    Alcohol/week: 12.0 standard drinks    Types: 12 Cans of beer per week  . Drug use: Never  . Sexual activity: Not Currently  Other Topics Concern  . Not on file  Social History Narrative   Mr. 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  He lives independently with his dog here in [place].  He formally lived in [place].   Social Determinants of Health   Financial Resource Strain:   . Difficulty of Paying Living Expenses:   Food Insecurity:   . Worried About Charity fundraiser in the Last Year:   . Arboriculturist in the Last Year:   Transportation Needs:   . Film/video editor (Medical):   Marland Kitchen Lack of Transportation (Non-Medical):   Physical Activity:   . Days of Exercise per Week:   . Minutes of  Exercise per Session:   Stress:   . Feeling of Stress :   Social Connections:   . Frequency of Communication with Friends and Family:   . Frequency of Social Gatherings with Friends and Family:   . Attends Religious Services:   . Active Member of Clubs or Organizations:   . Attends Archivist Meetings:   Marland Kitchen Marital Status:   Intimate Partner Violence:   . Fear of Current or Ex-Partner:   . Emotionally Abused:   Marland Kitchen Physically Abused:   . Sexually Abused:     FAMILY HISTORY:  Family History  Problem Relation Age of Onset  . Diabetes Father        died at age 11   . Colon cancer Neg Hx     CURRENT MEDICATIONS:  Outpatient Encounter Medications as of 03/14/2020  Medication Sig  . Ensure Plus (ENSURE PLUS) LIQD Give via PEG. 2 bottles in morning. 2 in afternoon. 1 before bed. (Patient taking differently: Place 237 mLs into feeding tube 2 (two) times daily between meals. )  . levothyroxine (SYNTHROID) 25 MCG tablet TAKE  1 TABLET (25 MCG TOTAL) BY MOUTH DAILY BEFORE BREAKFAST.  Marland Kitchen Nutritional Supplements (PROMOD) LIQD 30 mls (1 oz) via PEG. 1x/day (Patient taking differently: Place 30 mLs into feeding tube daily. Boost)  . OVER THE COUNTER MEDICATION Naked juice protein  . OVER THE COUNTER MEDICATION Take 1 Bottle by mouth daily. Bolthouse Farm Protein  . OVER THE COUNTER MEDICATION Take 1 Bottle by mouth daily. Bolthouse protein  . pregabalin (LYRICA) 150 MG capsule Take 150 mg by mouth 2 (two) times daily. Feeding tube  . [DISCONTINUED] pentoxifylline (TRENTAL) 400 MG CR tablet Take by mouth.  . [DISCONTINUED] pregabalin (LYRICA) 150 MG capsule Take by mouth.  Marland Kitchen ibuprofen (ADVIL,MOTRIN) 200 MG tablet Take 400 mg by mouth every 6 (six) hours as needed for moderate pain.    No facility-administered encounter medications on file as of 03/14/2020.    ALLERGIES:  No Known Allergies   PHYSICAL EXAM:  ECOG Performance status: 1  Vitals:   03/14/20 1529  BP: 115/90  Pulse:  85  Resp: 16  Temp: 98 F (36.7 C)  SpO2: 95%   Filed Weights   03/14/20 1529  Weight: 143 lb (64.9 kg)    Physical Exam Constitutional:      Appearance: Normal appearance.  Neck:     Comments: No oropharyngeal lesions.  Trismus present.  No palpable adenopathy.  Thickening of the skin and subcutaneous tissues of the neck present. Cardiovascular:     Rate and Rhythm: Normal rate and regular rhythm.  Pulmonary:     Breath sounds: Normal breath sounds.  Abdominal:     General: Abdomen is flat. There is no distension.     Palpations: Abdomen is soft. There is no mass.  Musculoskeletal:        General: No swelling.  Skin:    General: Skin is warm.  Neurological:     General: No focal deficit present.     Mental Status: He is alert and oriented to person, place, and time.  Psychiatric:        Mood and Affect: Mood normal.        Behavior: Behavior normal.      LABORATORY DATA:  I have reviewed the labs as listed.  CBC    Component Value Date/Time   WBC 1.5 (L) 03/07/2020 0933   RBC 3.74 (L) 03/07/2020 0933   HGB 12.0 (L) 03/07/2020 0933   HGB 14.2 05/12/2018 0949   HCT 36.9 (L) 03/07/2020 0933   HCT 42.0 05/12/2018 0949   PLT 139 (L) 03/07/2020 0933   PLT 256 05/12/2018 0949   MCV 98.7 03/07/2020 0933   MCV 87 05/12/2018 0949   MCH 32.1 03/07/2020 0933   MCHC 32.5 03/07/2020 0933   RDW 13.5 03/07/2020 0933   RDW 13.0 05/12/2018 0949   LYMPHSABS 0.4 (L) 03/07/2020 0933   LYMPHSABS 1.1 05/12/2018 0949   MONOABS 0.2 03/07/2020 0933   EOSABS 0.0 03/07/2020 0933   EOSABS 0.1 05/12/2018 0949   BASOSABS 0.0 03/07/2020 0933   BASOSABS 0.0 05/12/2018 0949   CMP Latest Ref Rng & Units 03/07/2020 02/14/2020 08/25/2019  Glucose 70 - 99 mg/dL 102(H) 102(H) 112(H)  BUN 8 - 23 mg/dL 18 14 27(H)  Creatinine 0.61 - 1.24 mg/dL 0.82 0.87 0.80  Sodium 135 - 145 mmol/L 137 136 138  Potassium 3.5 - 5.1 mmol/L 3.9 3.9 4.2  Chloride 98 - 111 mmol/L 98 99 101  CO2 22 - 32 mmol/L  '30 29 28  '$ Calcium 8.9 -  10.3 mg/dL 9.2 9.1 9.2  Total Protein 6.5 - 8.1 g/dL 6.7 - 7.0  Total Bilirubin 0.3 - 1.2 mg/dL 0.9 - 0.8  Alkaline Phos 38 - 126 U/L 52 - 55  AST 15 - 41 U/L 32 - 22  ALT 0 - 44 U/L 17 - 18       DIAGNOSTIC IMAGING:  I have independently reviewed the scans.   I have reviewed Venita Lick LPN's note and agree with the documentation.  I personally performed a face-to-face visit, made revisions and my assessment and plan is as follows.    ASSESSMENT & PLAN:   Tonsillar cancer ([place]) 1.  Left tonsil squamous cell carcinoma, stage IVa (T3 N2 M0): -3 cycles of carboplatin and 5-FU with radiation from 07/13/2018 through 09/13/2018. -Posttreatment PET scan on 02/07/2019 showed resolution of the left tonsillar mass and neck adenopathy with no active malignancy. -We reviewed results of CT soft tissue neck from 03/07/2020 which did not show any evidence of residual or recurrent disease.  Posttreatment changes in the oropharynx and hypopharynx. -EGD on 02/16/2020 was aborted secondary to radiation changes of the hypopharynx.  Utilizing the slim scope, edematous appearing arytenoids, cricopharyngeal opening identified, hypopharyngeal opening not apparent.  Unable to find the esophageal inlet.  Procedure was abandoned as unable to intubate esophagus. -Physical exam shows thickening and fibrosis of the neck with no clear palpable adenopathy. -We plan to repeat another CT scan in 6 months.  2.  Hypothyroidism: -He is taking Synthroid 25 mcg daily. -His TSH today is 9.4 improved from 11.8 previously. -We will increase his Synthroid to 50 mcg daily.  3.  Nutrition: -He cannot swallow by mouth.  He is continuing to use PEG tube with 2 cans of Ensure twice daily.  4.  Pancytopenia: -He has severe leukopenia and neutropenia even prior to start of therapy.  Blood counts have slightly worsened. -CBC on 03/05/2016 showed white count 1.5 with ANC of 800.  Platelet count is  139. -We will plan to repeat his CBC, and check for nutritional deficiencies in 3 months and follow-up with Randi. -If he has any worsening with neutrophil count less than 500, no other correctable causes we will consider bone marrow biopsy.  5.  Neuropathic pains: -He has neuropathic pains of the shoulder.  He was started on Lyrica 150 mg twice daily but Dr.Yanagihara in January of this year.  This is helping the pains.     Orders placed this encounter:  Orders Placed This Encounter  Procedures  . CBC with Differential/Platelet  . Lactate dehydrogenase  . Reticulocytes  . Vitamin B12  . Folate  . Methylmalonic acid, serum  . ANA, IFA (with reflex)  . Rheumatoid factor  . Hepatitis B surface antigen  . Hepatitis B surface antibody,qualitative  . Hepatitis B core antibody, total  . Copper, serum  . Protein electrophoresis, serum  . Hepatitis C Antibody      Derek Jack, MD [facility] [phone]

## 2020-03-14 NOTE — Addendum Note (Signed)
Addended by: Derek Jack on: 03/14/2020 06:27 PM   Modules accepted: Orders

## 2020-03-15 ENCOUNTER — Telehealth (HOSPITAL_COMMUNITY): Payer: Self-pay | Admitting: *Deleted

## 2020-03-15 DIAGNOSIS — C099 Malignant neoplasm of tonsil, unspecified: Secondary | ICD-10-CM

## 2020-03-15 NOTE — Telephone Encounter (Signed)
-----   Message from Derek Jack, MD sent at 03/14/2020  6:27 PM EDT ----- Loney Hering call this patient and let him know that we increased his Synthroid to 50 mcg daily.  I have sent a prescription to his pharmacy.  Also please add a TSH level in 3 months when he comes back to see Louie Casa.Thanks.

## 2020-03-15 NOTE — Telephone Encounter (Signed)
Left message on machine informing pt that new prescription of synthroid has been sent in to his pharmacy. Advised pt to call us back if there were any questions.

## 2020-03-16 ENCOUNTER — Telehealth (HOSPITAL_COMMUNITY): Payer: Self-pay

## 2020-03-16 NOTE — Telephone Encounter (Signed)
Nutrition  RD called patient for nutrition follow-up and address concerns.  No answer or option to leave voicemail as mailbox full.  Patient is on RD's schedule for 4/16.  Conan Mcmanaway B. Zenia Resides, [place], [place] Registered Dietitian [phone] (pager)

## 2020-03-22 ENCOUNTER — Ambulatory Visit: Payer: Medicare Other | Attending: Internal Medicine

## 2020-03-22 DIAGNOSIS — Z23 Encounter for immunization: Secondary | ICD-10-CM

## 2020-03-22 NOTE — Progress Notes (Signed)
Covid-19 Vaccination Clinic  Name:  [name]    MRN: [id number] DOB: 1951/04/20  03/22/2020  [name] was observed post Covid-19 immunization for 15 minutes without incident. He was provided with Vaccine Information Sheet and instruction to access the V-Safe system.   [name] was instructed to call 911 with any severe reactions post vaccine: Marland Kitchen Difficulty breathing  . Swelling of face and throat  . A fast heartbeat  . A bad rash all over body  . Dizziness and weakness   Immunizations Administered    Name Date Dose VIS Date Route   Pfizer COVID-19 Vaccine 03/22/2020  1:34 PM 0.3 mL 12/02/2019 Intramuscular   Manufacturer: [employer], [employer]   Lot: [id number]   NDC: [address]-4

## 2020-04-06 ENCOUNTER — Inpatient Hospital Stay (HOSPITAL_COMMUNITY): Payer: Medicare Other | Attending: Hematology

## 2020-04-06 ENCOUNTER — Telehealth (HOSPITAL_COMMUNITY): Payer: Self-pay

## 2020-04-06 NOTE — Telephone Encounter (Signed)
Nutrition  Patient was on RD's schedule for today at 10am in person.  Nutrition appointment was not listed as phone visit.  Patient did not show up at 10am.    RD called patient at 10:13am via phone for nutrition follow-up.  No answer.  Left message with call back number.    Teodoro Jeffreys B. Zenia Resides, [place], [place] Registered Dietitian [phone] (pager)

## 2020-04-13 ENCOUNTER — Ambulatory Visit (HOSPITAL_COMMUNITY): Payer: Medicare Other

## 2020-04-13 NOTE — Progress Notes (Signed)
Nutrition  Patient rescheduled for nutrition visit today at 8:30 via phone (pt preference).  RD called patient at scheduled time but did not answer. RD was NOT able to leave voicemail due to mailbox being full.   Konrad Hoak B. Zenia Resides, [place], [place] Registered Dietitian [phone] (pager)

## 2020-04-16 ENCOUNTER — Ambulatory Visit: Payer: Medicare Other | Attending: Internal Medicine

## 2020-04-17 ENCOUNTER — Ambulatory Visit: Payer: Medicare Other

## 2020-04-17 DIAGNOSIS — Z23 Encounter for immunization: Secondary | ICD-10-CM

## 2020-04-17 NOTE — Progress Notes (Signed)
Covid-19 Vaccination Clinic  Name:  [name]    MRN: [id number] DOB: [birth year]  04/17/2020  Mr. [name] was observed post Covid-19 immunization for 15 minutes without incident. He was provided with Vaccine Information Sheet and instruction to access the V-Safe system.   Mr. [name] was instructed to call 911 with any severe reactions post vaccine: Marland Kitchen Difficulty breathing  . Swelling of face and throat  . A fast heartbeat  . A bad rash all over body  . Dizziness and weakness   Immunizations Administered    Name Date Dose VIS Date Route   Pfizer COVID-19 Vaccine 04/17/2020 10:05 AM 0.3 mL 02/15/2019 Intramuscular   Manufacturer: [employer], [employer]   Lot: [id number]   NDC: [address]-4

## 2020-04-20 ENCOUNTER — Ambulatory Visit (HOSPITAL_COMMUNITY): Payer: Medicare Other

## 2020-04-20 NOTE — Progress Notes (Signed)
Nutrition Follow-up:  Patient with large left tonsillar mass, SCC.  Patient has completed treatment and continues with relying on 100% of nutrition coming from PEG tube.    Spoke with patient via phone as patient has not returned calls from previous attempts to connect with him.  Patient reports that he give 2 cartons of ensure plus in the AM and 2 cartons in the PM.  He give a variety of drinks at lunch time (Bolthouse, Naked, boost high protein, broth, tomato soup).  Frustrated that he is still having to use feeding tube and not able to eat anything by mouth.   Reports that he is giving prosource 1 oz typically in the am via feeding tube.  Admits to forgetting at times. Reports that at times he does not feel like giving any nutrition through feeding tube.  When asked how frequently this happens states last Sunday.    Noted results of EGD.  Has done PT/SLP.  Medications: reviewed  Labs: reviewed from 3/17  Anthropometrics:   Noted weight 143 lb on 3/24 per chart decreased from weight on 9/22 of 154 lb.    Patient reports 134 lb today on home scales.     Estimated Energy Needs  Kcals: 1750-2100 Protein: 88-105 g Fluid: > 1.7 L  NUTRITION DIAGNOSIS: Inability to eat continues to rely on feeding tube.    INTERVENTION:  Patient not giving prescribed amount of tube feeding.  Recommend patient daily give 5 cartons of ensure plus via feeding and 1 oz prosource with weight loss.  After getting this amount in can add Bolthouse shakes, naked shakes, broth, etc via tube. Discussed importance of not substituting lower calorie/protein items via tube.   Tube feeding will provide 1850 calories, 75 g protein.  Provided patient contact number.  Reports that RD's name comes up on his phone.       MONITORING, EVALUATION, GOAL: weight trends, TF tolerance   NEXT VISIT: July 30 th via phone.  Discussed that RD would not be following as frequently and he needs to call RD if has questions or concerns  before scheduled visit.    Trang Bouse B. Zenia Resides, [place], [place] Registered Dietitian [phone] (pager)

## 2020-06-14 ENCOUNTER — Inpatient Hospital Stay (HOSPITAL_COMMUNITY): Payer: Medicare Other | Attending: Hematology

## 2020-06-20 ENCOUNTER — Ambulatory Visit (HOSPITAL_COMMUNITY): Payer: Medicare Other | Admitting: Hematology

## 2020-06-21 ENCOUNTER — Telehealth (HOSPITAL_COMMUNITY): Payer: Medicare Other | Admitting: Nurse Practitioner

## 2020-07-09 DIAGNOSIS — K222 Esophageal obstruction: Secondary | ICD-10-CM | POA: Insufficient documentation

## 2020-07-20 ENCOUNTER — Ambulatory Visit (HOSPITAL_COMMUNITY): Payer: Medicare Other

## 2020-07-20 NOTE — Progress Notes (Signed)
Nutrition Follow-up:  Patient with large left tonsillar mass, SCC.  Patient has completed treatment and continues with relying on 100% of nutrition coming from PEG tube.   Spoke with patient via phone for nutrition follow-up.  Patient reports that he is giving 2 ensure plus in the AM and 2 in the PM.  Also gives variety of drinks via tube ([place], Naked, boost high protein, broth, tomato soup.  Gives prosource 1 oz daily when he remembers. Reports that dilation of throat was unsuccessful on 07/16/20.  Had G-tube exchanged this day due to malfunction. Reports taking about 2 16 oz bottles of water via tube.    Reports that he is unable to take anything by mouth.   Medications: reviewed  Labs: no new  Anthropometrics:   Weight per home scale today per patient 132 lb  Noted on 7/26 weight of 136 lb at [facility]  Weight per chart 142 lb on 3/24  Patient reports that in Feb/ March after initial EGD with dilation was not successful did not give tube feeding for about a week due to not feeling well.  Feels weight loss came from that and he has never been about to gain it back.   Estimated Energy Needs  Kcals: 1750-2100 Protein: 88-105 g Fluid: > 1.7 L  NUTRITION DIAGNOSIS: Inability to eat continues to rely on feeding tube    INTERVENTION:  Stressed importance of patient getting in adequate calories daily to prevent further weight loss.  Concerned about continued weight loss.  Provided calorie goal for patient daily. Discussed that just 4 ensure plus per day is not enough calories. Encouraged 5 cartons of ensure plus daily and 1 oz prosource daily then any additional drinks/shakes that he wants to put through tube for added calories and protein.  Patient reports that he gets to where he can't burp with increase in tube feeding.  Discussed options of not giving 2 cartons at one time and giving 1 carton q 3-4 hours.  He has tried this and not interested in doing this again.  Reports that he does  not want to give tube feeding all the time.  Continue to stress importance of not substituting lower calorie shake. Prescribed tube feeding will provide 1850 calories and 75 g protein.   Contact information provided and requested that patient call RD if concerns or changes in status occur Will send message to scheduler to get patient rescheduled    MONITORING, EVALUATION, GOAL: weight trends, intake, tube feeding tolerance   NEXT VISIT: October 22, phone f/u  Desmond Szabo B. Zenia Resides, [place], [place] Registered Dietitian [phone] (mobile)

## 2020-07-23 ENCOUNTER — Inpatient Hospital Stay (HOSPITAL_COMMUNITY): Payer: Medicare Other | Attending: Hematology

## 2020-07-23 ENCOUNTER — Other Ambulatory Visit: Payer: Self-pay

## 2020-07-23 DIAGNOSIS — C099 Malignant neoplasm of tonsil, unspecified: Secondary | ICD-10-CM | POA: Diagnosis present

## 2020-07-23 DIAGNOSIS — E039 Hypothyroidism, unspecified: Secondary | ICD-10-CM | POA: Insufficient documentation

## 2020-07-23 DIAGNOSIS — Z79899 Other long term (current) drug therapy: Secondary | ICD-10-CM | POA: Diagnosis not present

## 2020-07-23 DIAGNOSIS — Z1329 Encounter for screening for other suspected endocrine disorder: Secondary | ICD-10-CM | POA: Diagnosis not present

## 2020-07-23 LAB — HEPATITIS B SURFACE ANTIBODY,QUALITATIVE: Hep B S Ab: NONREACTIVE

## 2020-07-23 LAB — RETICULOCYTES
Immature Retic Fract: 13.2 % (ref 2.3–15.9)
RBC.: 3.86 MIL/uL — ABNORMAL LOW (ref 4.22–5.81)
Retic Count, Absolute: 64.1 10*3/uL (ref 19.0–186.0)
Retic Ct Pct: 1.7 % (ref 0.4–3.1)

## 2020-07-23 LAB — CBC WITH DIFFERENTIAL/PLATELET
Abs Immature Granulocytes: 0 10*3/uL (ref 0.00–0.07)
Basophils Absolute: 0 10*3/uL (ref 0.0–0.1)
Basophils Relative: 1 %
Eosinophils Absolute: 0 10*3/uL (ref 0.0–0.5)
Eosinophils Relative: 1 %
HCT: 37.9 % — ABNORMAL LOW (ref 39.0–52.0)
Hemoglobin: 12.2 g/dL — ABNORMAL LOW (ref 13.0–17.0)
Immature Granulocytes: 0 %
Lymphocytes Relative: 25 %
Lymphs Abs: 0.5 10*3/uL — ABNORMAL LOW (ref 0.7–4.0)
MCH: 31.9 pg (ref 26.0–34.0)
MCHC: 32.2 g/dL (ref 30.0–36.0)
MCV: 99 fL (ref 80.0–100.0)
Monocytes Absolute: 0.3 10*3/uL (ref 0.1–1.0)
Monocytes Relative: 13 %
Neutro Abs: 1.1 10*3/uL — ABNORMAL LOW (ref 1.7–7.7)
Neutrophils Relative %: 60 %
Platelets: 165 10*3/uL (ref 150–400)
RBC: 3.83 MIL/uL — ABNORMAL LOW (ref 4.22–5.81)
RDW: 13.5 % (ref 11.5–15.5)
WBC: 1.9 10*3/uL — ABNORMAL LOW (ref 4.0–10.5)
nRBC: 0 % (ref 0.0–0.2)

## 2020-07-23 LAB — TSH: TSH: 12.343 u[IU]/mL — ABNORMAL HIGH (ref 0.350–4.500)

## 2020-07-23 LAB — HEPATITIS C ANTIBODY: HCV Ab: NONREACTIVE

## 2020-07-23 LAB — HEPATITIS B CORE ANTIBODY, TOTAL: Hep B Core Total Ab: NONREACTIVE

## 2020-07-23 LAB — LACTATE DEHYDROGENASE: LDH: 119 U/L (ref 98–192)

## 2020-07-23 LAB — VITAMIN B12: Vitamin B-12: 993 pg/mL — ABNORMAL HIGH (ref 180–914)

## 2020-07-23 LAB — FOLATE: Folate: 23.1 ng/mL (ref >5.9)

## 2020-07-23 LAB — HEPATITIS B SURFACE ANTIGEN: Hepatitis B Surface Ag: NONREACTIVE

## 2020-07-24 LAB — PROTEIN ELECTROPHORESIS, SERUM
A/G Ratio: 1.1 (ref 0.7–1.7)
Albumin ELP: 3.6 g/dL (ref 2.9–4.4)
Alpha-1-Globulin: 0.3 g/dL (ref 0.0–0.4)
Alpha-2-Globulin: 0.8 g/dL (ref 0.4–1.0)
Beta Globulin: 1.1 g/dL (ref 0.7–1.3)
Gamma Globulin: 1.1 g/dL (ref 0.4–1.8)
Globulin, Total: 3.2 g/dL (ref 2.2–3.9)
Total Protein ELP: 6.8 g/dL (ref 6.0–8.5)

## 2020-07-24 LAB — ANTINUCLEAR ANTIBODIES, IFA: ANA Ab, IFA: NEGATIVE

## 2020-07-24 LAB — RHEUMATOID FACTOR: Rheumatoid fact SerPl-aCnc: <10 IU/mL (ref 0.0–13.9)

## 2020-07-25 LAB — COPPER, SERUM: Copper: 115 ug/dL (ref 69–132)

## 2020-07-25 LAB — METHYLMALONIC ACID, SERUM: Methylmalonic Acid, Quantitative: 277 nmol/L (ref 0–378)

## 2020-07-26 ENCOUNTER — Inpatient Hospital Stay (HOSPITAL_BASED_OUTPATIENT_CLINIC_OR_DEPARTMENT_OTHER): Payer: Medicare Other | Admitting: Nurse Practitioner

## 2020-07-26 DIAGNOSIS — C099 Malignant neoplasm of tonsil, unspecified: Secondary | ICD-10-CM

## 2020-07-26 MED ORDER — LEVOTHYROXINE SODIUM 50 MCG PO TABS: 50.0000 ug | ORAL_TABLET | Freq: Every day | ORAL | 3 refills | Status: DC

## 2020-07-26 MED ORDER — PREGABALIN 150 MG PO CAPS: 150.0000 mg | ORAL_CAPSULE | Freq: Two times a day (BID) | ORAL | 0 refills | Status: DC

## 2020-07-26 NOTE — Progress Notes (Signed)
[place] Cancer Follow up:    [name] Norlander, DO [place] [address]   DIAGNOSIS:Tonsillar cancer   Cancer Staging Tonsillar cancer [facility]) Staging form: Pharynx - HPV-Mediated Oropharynx, AJCC 8th Edition - Clinical stage from 06/02/2018: Stage II (cT3, cN2, cM0) - Unsigned   SUMMARY OF ONCOLOGIC HISTORY: Oncology History  Tonsillar cancer ([place])  06/02/2018 Initial Diagnosis   Tonsillar cancer ([place])   07/13/2018 - 09/17/2018 Chemotherapy   The patient had palonosetron (ALOXI) injection 0.25 mg, 0.25 mg, Intravenous,  Once, 3 of 3 cycles Administration: 0.25 mg (07/13/2018), 0.25 mg (07/15/2018), 0.25 mg (08/10/2018), 0.25 mg (09/13/2018) fluorouracil (ADRUCIL) 4,750 mg in sodium chloride 0.9 % 55 mL chemo infusion, 600 mg/m2/day = 4,750 mg (100 % of original dose 600 mg/m2/day), Intravenous, 4D (96 hours ), 3 of 3 cycles Dose modification: 600 mg/m2/day (original dose 600 mg/m2/day, Cycle 1, Reason: Other (see comments), Comment: french protocol head and neck) Administration: 4,750 mg (07/13/2018), 4,750 mg (08/10/2018), 4,750 mg (09/13/2018)  for chemotherapy treatment.      CURRENT THERAPY: observation  INTERVAL HISTORY: [name] 69 y.o. male was called for a telephone visit for tonsillar cancer.  Patient reports he is still having trouble swallowing.  He still has thick sputum.  Denies any nausea, vomiting, or diarrhea. Denies any new pains. Had not noticed any recent bleeding such as epistaxis, hematuria or hematochezia. Denies recent chest pain on exertion, shortness of breath on minimal exertion, pre-syncopal episodes, or palpitations. Denies any numbness or tingling in hands or feet. Denies any recent fevers, infections, or recent hospitalizations.     Patient Active Problem List   Diagnosis Date Noted  . Dysphagia 10/27/2019  . Protein-calorie malnutrition, severe 08/25/2018  . Neutropenic fever ([place]) 08/24/2018  . Hyperlipidemia  08/24/2018  . Pancytopenia ([place]) 08/24/2018  . Hyponatremia 08/24/2018  . Hyperbilirubinemia 08/24/2018  . Problems with swallowing and mastication   . Cellulitis and perichondritis of larynx   . Chronic periodontitis 06/10/2018  . Partial loss of teeth, unspecified edentulism 06/10/2018  . Tonsillar cancer ([place]) 06/02/2018  . Mass of oral cavity 05/12/2018  . Positive FIT (fecal immunochemical test) 05/12/2018    has No Known Allergies.  MEDICAL HISTORY: Past Medical History:  Diagnosis Date  . HOH (hard of hearing)   . Hyperlipidemia   . Mass of oral cavity 05/12/2018  . Positive FIT (fecal immunochemical test) 05/12/2018  . PTSD (post-traumatic stress disorder)    has not been offically diagnosised  . Tonsillar cancer ([place]) 06/02/2018    SURGICAL HISTORY: Past Surgical History:  Procedure Laterality Date  . APPENDECTOMY  1969  . COLONOSCOPY W/ POLYPECTOMY     remote past  . COLONOSCOPY WITH PROPOFOL N/A 02/16/2020   Procedure: COLONOSCOPY WITH PROPOFOL;  Surgeon: Daneil Dolin, MD;  [facility];  Service: Endoscopy;  Laterality: N/A;  1:45pm  . ESOPHAGOGASTRODUODENOSCOPY  08/04/2018   Dr. Arnoldo Morale: erythema of larynx, normal stomach, normal duodenum. PEG Placed 20 F, bumper at 2.5 cm  . ESOPHAGOGASTRODUODENOSCOPY (EGD) WITH PROPOFOL N/A 08/04/2018   Procedure: ESOPHAGOGASTRODUODENOSCOPY (EGD) WITH PROPOFOL;  Surgeon: Aviva Signs, MD;  [facility];  Service: Gastroenterology;  Laterality: N/A;  . ESOPHAGOGASTRODUODENOSCOPY (EGD) WITH PROPOFOL N/A 02/16/2020   Procedure: ESOPHAGOGASTRODUODENOSCOPY (EGD) WITH PROPOFOL;  Surgeon: Daneil Dolin, MD;  [facility];  Service: Endoscopy;  Laterality: N/A;  . FINGER FRACTURE SURGERY Left   . MULTIPLE EXTRACTIONS WITH ALVEOLOPLASTY N/A 06/15/2018  Procedure: Extraction of tooth #'s 2,12,13,18,20,and 21 with alveoloplasty and gross debridement of remaining teeth;  Surgeon: Lenn Cal, DDS;   Location: [place];  Service: Oral Surgery;  Laterality: N/A;  . PEG PLACEMENT N/A 08/04/2018   Procedure: PERCUTANEOUS ENDOSCOPIC GASTROSTOMY (PEG) PLACEMENT;  Surgeon: Aviva Signs, MD;  [facility];  Service: Gastroenterology;  Laterality: N/A;  . POLYPECTOMY  02/16/2020   Procedure: POLYPECTOMY;  Surgeon: Daneil Dolin, MD;  [facility];  Service: Endoscopy;;  . PORTACATH PLACEMENT Left 07/02/2018   Procedure: INSERTION PORT-A-CATH;  Surgeon: Aviva Signs, MD;  Location: [facility];  Service: General;  Laterality: Left;    SOCIAL HISTORY: Social History   Socioeconomic History  . Marital status: Divorced    Spouse name: Not on file  . Number of children: Not on file  . Years of education: Not on file  . Highest education level: Not on file  Occupational History  . Occupation: veteran, retired  Tobacco Use  . Smoking status: Former Smoker    Years: 15.00    Types: Cigarettes    Quit date: 2000    Years since quitting: 21.6  . Smokeless tobacco: Never Used  Vaping Use  . Vaping Use: Never used  Substance and Sexual Activity  . Alcohol use: Not Currently    Alcohol/week: 12.0 standard drinks    Types: 12 Cans of beer per week  . Drug use: Never  . Sexual activity: Not Currently  Other Topics Concern  . 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  He lives independently with his dog here in [place].  He formally lived in [place].   Social Determinants of Health   Financial Resource Strain:   . Difficulty of Paying Living Expenses:   Food Insecurity:   . Worried About Charity fundraiser in the Last Year:   . Arboriculturist in the Last Year:   Transportation Needs:   . Film/video editor (Medical):   Marland Kitchen Lack of Transportation (Non-Medical):   Physical Activity:   . Days of Exercise per Week:   . Minutes of Exercise per Session:   Stress:   . Feeling of Stress :   Social Connections:    . Frequency of Communication with Friends and Family:   . Frequency of Social Gatherings with Friends and Family:   . Attends Religious Services:   . Active Member of Clubs or Organizations:   . Attends Archivist Meetings:   Marland Kitchen Marital Status:   Intimate Partner Violence:   . Fear of Current or Ex-Partner:   . Emotionally Abused:   Marland Kitchen Physically Abused:   . Sexually Abused:     FAMILY HISTORY: Family History  Problem Relation Age of Onset  . Diabetes Father        died at age 70   . Colon cancer Neg Hx     Review of Systems  Constitutional: Positive for fatigue.  HENT:   Positive for trouble swallowing.   All other systems reviewed and are negative.    Vital signs: -Deferred due to telephone visit  Physical Exam -Deferred due to telephone visit -Patient was alert and oriented of the phone in no acute distress.  LABORATORY DATA:  CBC    Component Value Date/Time   WBC 1.9 (L) 07/23/2020 1206   RBC 3.83 (L) 07/23/2020 1206   RBC 3.86 (L) 07/23/2020  1206   HGB 12.2 (L) 07/23/2020 1206   HGB 14.2 05/12/2018 0949   HCT 37.9 (L) 07/23/2020 1206   HCT 42.0 05/12/2018 0949   PLT 165 07/23/2020 1206   PLT 256 05/12/2018 0949   MCV 99.0 07/23/2020 1206   MCV 87 05/12/2018 0949   MCH 31.9 07/23/2020 1206   MCHC 32.2 07/23/2020 1206   RDW 13.5 07/23/2020 1206   RDW 13.0 05/12/2018 0949   LYMPHSABS 0.5 (L) 07/23/2020 1206   LYMPHSABS 1.1 05/12/2018 0949   MONOABS 0.3 07/23/2020 1206   EOSABS 0.0 07/23/2020 1206   EOSABS 0.1 05/12/2018 0949   BASOSABS 0.0 07/23/2020 1206   BASOSABS 0.0 05/12/2018 0949    CMP     Component Value Date/Time   NA 137 03/07/2020 0933   NA 143 05/12/2018 0949   K 3.9 03/07/2020 0933   CL 98 03/07/2020 0933   CO2 30 03/07/2020 0933   GLUCOSE 102 (H) 03/07/2020 0933   BUN 18 03/07/2020 0933   BUN 18 05/12/2018 0949   CREATININE 0.82 03/07/2020 0933   CALCIUM 9.2 03/07/2020 0933   PROT 6.7 03/07/2020 0933   PROT 6.9  05/12/2018 0949   ALBUMIN 3.9 03/07/2020 0933   ALBUMIN 4.2 05/12/2018 0949   AST 32 03/07/2020 0933   ALT 17 03/07/2020 0933   ALKPHOS 52 03/07/2020 0933   BILITOT 0.9 03/07/2020 0933   BILITOT 0.6 05/12/2018 0949   GFRNONAA >60 03/07/2020 0933   GFRAA >60 03/07/2020 0933    All questions were answered to patient's stated satisfaction. Encouraged patient to call with any new concerns or questions before his next visit to the cancer center and we can certain see him sooner, if needed.     ASSESSMENT and THERAPY PLAN:   Tonsillar cancer ([place]) 1.  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 -Posttreatment PET scan on 02/07/2019 showed resolution of left tonsillar mass and neck adenopathy with no active malignancy. -CT soft tissue of the neck on 03/07/2020 which did not show any evidence of residual or recurrent disease.  Posttreatment changes in the oropharynx and hypopharynx. -EGD on 02/16/2020 was aborted secondary to radiation changes of the hypopharynx.  Utilizing the slim scope, edematous appearing arytenoids, cricopharyngeal opening identified, hypopharyngeal opening not apparent.  Unable to find the esophageal inlet.  Procedure was abandoned as unable to intubate esophagus. -Labs done on 07/23/2020 showed WBC 1.9, hemoglobin 12.2, platelets 165, B12 993, methylmalonic acid 277, copper 115, M spike negative -We will see him back in 6 months with repeat labs. He does not want to do scans at this time.   2.  Hypothyroidism: -She is taking Synthroid 50 mcg daily. -TSH increased from 9.4 to 12.34 -He was not taking medication due to a problem with his PEG tube.   3.  Nutrition: -She cannot swallow by mouth. -He continues to use PEG tube with 2 cans of Ensure twice daily.  4.  Pancytopenia: -He has severe leukopenia and neutropenia even prior to the start of therapy. -Labs done on 07/23/2020 showed WBC  1.9.  ANC 1100, vitamin B12 993, methylmalonic acid 277, copper 115, folate 23.1, M spike negative -If he has any worsening neutrophilia count less than 500 with no other correctable causes we will consider bone marrow biopsy.   Orders Placed This Encounter  Procedures  . CBC with Differential/Platelet    Standing Status:   Future    Standing  Expiration Date:   07/26/2021  . Comprehensive metabolic panel    Standing Status:   Future    Standing Expiration Date:   07/26/2021  . Ferritin    Standing Status:   Future    Standing Expiration Date:   07/26/2021  . Iron and TIBC    Standing Status:   Future    Standing Expiration Date:   07/26/2021  . Lactate dehydrogenase    Standing Status:   Future    Standing Expiration Date:   07/26/2021  . Vitamin B12    Standing Status:   Future    Standing Expiration Date:   07/26/2021  . VITAMIN D 25 Hydroxy (Vit-D Deficiency, Fractures)    Standing Status:   Future    Standing Expiration Date:   07/26/2021  . TSH    Standing Status:   Future    Standing Expiration Date:   07/26/2021    All questions were answered. The patient knows to call the clinic with any problems, questions or concerns. We can certainly see the patient much sooner if necessary. This note was electronically signed.  I provided 30 minutes of non face-to-face telephone visit time during this encounter, and > 50% was spent counseling as documented under my assessment & plan.   Glennie Isle, NP-C 07/26/2020

## 2020-07-26 NOTE — Assessment & Plan Note (Addendum)
1.  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 -Posttreatment PET scan on 02/07/2019 showed resolution of left tonsillar mass and neck adenopathy with no active malignancy. -CT soft tissue of the neck on 03/07/2020 which did not show any evidence of residual or recurrent disease.  Posttreatment changes in the oropharynx and hypopharynx. -EGD on 02/16/2020 was aborted secondary to radiation changes of the hypopharynx.  Utilizing the slim scope, edematous appearing arytenoids, cricopharyngeal opening identified, hypopharyngeal opening not apparent.  Unable to find the esophageal inlet.  Procedure was abandoned as unable to intubate esophagus. -Labs done on 07/23/2020 showed WBC 1.9, hemoglobin 12.2, platelets 165, B12 993, methylmalonic acid 277, copper 115, M spike negative -We will see him back in 6 months with repeat labs. He does not want to do scans at this time.   2.  Hypothyroidism: -She is taking Synthroid 50 mcg daily. -TSH increased from 9.4 to 12.34 -He was not taking medication due to a problem with his PEG tube.   3.  Nutrition: -She cannot swallow by mouth. -He continues to use PEG tube with 2 cans of Ensure twice daily.  4.  Pancytopenia: -He has severe leukopenia and neutropenia even prior to the start of therapy. -Labs done on 07/23/2020 showed WBC 1.9.  ANC 1100, vitamin B12 993, methylmalonic acid 277, copper 115, folate 23.1, M spike negative -If he has any worsening neutrophilia count less than 500 with no other correctable causes we will consider bone marrow biopsy. 

## 2020-08-27 ENCOUNTER — Other Ambulatory Visit (HOSPITAL_COMMUNITY): Payer: Self-pay | Admitting: Nurse Practitioner

## 2020-08-27 DIAGNOSIS — C099 Malignant neoplasm of tonsil, unspecified: Secondary | ICD-10-CM

## 2020-08-29 ENCOUNTER — Encounter: Payer: Self-pay | Admitting: Nurse Practitioner

## 2020-08-29 ENCOUNTER — Other Ambulatory Visit: Payer: Self-pay

## 2020-08-29 ENCOUNTER — Ambulatory Visit (INDEPENDENT_AMBULATORY_CARE_PROVIDER_SITE_OTHER): Payer: Medicare Other | Admitting: Nurse Practitioner

## 2020-08-29 VITALS — BP 108/57 | HR 54 | Temp 97.3°F | Ht 68.0 in | Wt 139.0 lb

## 2020-08-29 DIAGNOSIS — R1310 Dysphagia, unspecified: Secondary | ICD-10-CM

## 2020-08-29 DIAGNOSIS — R634 Abnormal weight loss: Secondary | ICD-10-CM | POA: Diagnosis not present

## 2020-08-29 DIAGNOSIS — R1319 Other dysphagia: Secondary | ICD-10-CM

## 2020-08-29 DIAGNOSIS — C099 Malignant neoplasm of tonsil, unspecified: Secondary | ICD-10-CM | POA: Diagnosis not present

## 2020-08-29 DIAGNOSIS — Z978 Presence of other specified devices: Secondary | ICD-10-CM

## 2020-08-29 NOTE — Progress Notes (Signed)
Cc'ed to pcp °

## 2020-08-29 NOTE — Patient Instructions (Addendum)
Your health issues we discussed today were:   Dysphagia (swallowing difficulties) requiring tube feeding due to cancer: 1. I am sorry they were unable to complete the upper endoscopy at [facility] 2. Keep your appointment next month for the second attempt going through your stomach 3. Hopefully they can provide you some relief 4. Continue to use your G-tube and tube feeds including the juice and smooth the like products you have been purchasing 5. Call us for any significant changes  Weight loss: 1. Fortunately, your weight over the last 6 months has been stable within 3 pounds 2. Continue your supplements as you have been as I feel they are maintain your weight well 3. Call us for any obvious weight loss  Overall I recommend:  1. Continue your other current medications 2. Return for follow-up in 6 months 3. Call us for any questions or concerns   At [facility] we value your feedback. You may receive a survey about your visit today. Please share your experience as we strive to create trusting relationships with our patients to provide genuine, compassionate, quality care.  We appreciate your understanding and patience as we review any laboratory studies, imaging, and other diagnostic tests that are ordered as we care for you. Our office policy is 5 business days for review of these results, and any emergent or urgent results are addressed in a timely manner for your best interest. If you do not hear from our office in 1 week, please contact us.   We also encourage the use of MyChart, which contains your medical information for your review as well. If you are not enrolled in this feature, an access code is on this after visit summary for your convenience. Thank you for allowing [place] to be involved in your care.  It was great to see you today!  I hope you have a great rest of your summer!!

## 2020-08-29 NOTE — Progress Notes (Signed)
Referring Provider: [name], DO Primary Care Physician:  [name], DO Primary GI:  Dr. Gala Romney  Chief Complaint  Patient presents with  . Dysphagia    HPI:   [name] is a 69 y.o. male who presents for follow-up on dysphagia.  Patient last seen in our office 02/23/2020 for the same as well as positive FIT test.  Previous episodes of heme positive stool colonoscopy postponed until March/April 2020.  Diagnosed with tonsillar cancer in June 2019 with weight loss and severe mucositis during treatment requiring a PEG tube in August 2019.  Last colonoscopy 15 years prior.  Finished chemotherapy and radiation in September/October 2019 but still with some dysphagia symptoms.  Patient is having difficulty coming off the PEG tube and so required PEG feedings.  PET scan showed resolution of left tonsillar mass and neck adenopathy with no current findings of active malignancy in 2020.  Colonoscopy was updated 02/16/2020 with a single 5 mm polyp in the cecum, diverticulosis in sigmoid colon, otherwise normal.  Pathology found tubular adenoma and recommended no future colonoscopy unless new symptoms develop.  EGD the same day aborted due to abnormal anatomy with radiation changes of the hypopharynx under esophageal basin recommended continue PEG tube, follow-up with ENT as soon as possible.  At his last visit he was still having dysphagia, frustrated with the symptoms.  Not sure if he has an appointment with ENT.  PEG tube still working.  No other overt GI complaints.  He was asking about a Covid vaccine I recommended he be vaccinated due to his high risk.  Also discussed the inability to complete EGD due to significant radiation damage.  Recommended he see ENT as soon as possible and he was provided with the ENT phone number to schedule an appointment.  Continue PEG tube for now.  Follow-up in 6 months.  It appears he did have a virtual office visit with otolaryngology at [facility],  although details are not available in the system.  It appears he had an EGD on 07/16/2020 also at [facility].  Results are also not available to me in his system.  It appears he also underwent a G-tube change out at the same time.  Patient last saw oncology on 07/26/2020 for a virtual visit.  Noted persistent difficulty swallowing with thick sputum.  No new pain, nausea, vomiting, or diarrhea.  No recent bleeding.  Today he states he doing okay overall.  Still swallowing difficulties. He states they attempted EGD at [name] but were unsuccessful. He's going back next month and they will attempt esophageal dilation by going through his GTube/stomach. Still G-Tube dependent. Minimal to no abdominal pain that he attributes to the GTube being annoying but "I wouldn't really say it's a pain." Denies N/V, hematochezia, melena, fever, chills. He uses some protein drinks with his GTube including Naked, Bolthouse, etc. He does have a lot of mucus and saliva after GTub feeding. Occasionally spits up a small amount of blood tinge. He has lost a total of about 50 lbs over the past 2 years since he had cancer and all the subsequent complications. Objectively he is within 3 lbs since last visit 6 months ago.  He feels like his throat swelling is getting progressive. Has mentioned to Oncology but no recommendations were made.   Past Medical History:  Diagnosis Date  . HOH (hard of hearing)   . Hyperlipidemia   . Mass of oral cavity 05/12/2018  . Positive FIT (fecal immunochemical  test) 05/12/2018  . PTSD (post-traumatic stress disorder)    has not been offically diagnosised  . Tonsillar cancer ([place]) 06/02/2018    Past Surgical History:  Procedure Laterality Date  . APPENDECTOMY  1969  . COLONOSCOPY W/ POLYPECTOMY     remote past  . COLONOSCOPY WITH PROPOFOL N/A 02/16/2020   Procedure: COLONOSCOPY WITH PROPOFOL;  Surgeon: Daneil Dolin, MD;  [facility];  Service: Endoscopy;  Laterality: N/A;  1:45pm  .  ESOPHAGOGASTRODUODENOSCOPY  08/04/2018   Dr. Arnoldo Morale: erythema of larynx, normal stomach, normal duodenum. PEG Placed 20 F, bumper at 2.5 cm  . ESOPHAGOGASTRODUODENOSCOPY (EGD) WITH PROPOFOL N/A 08/04/2018   Procedure: ESOPHAGOGASTRODUODENOSCOPY (EGD) WITH PROPOFOL;  Surgeon: Aviva Signs, MD;  [facility];  Service: Gastroenterology;  Laterality: N/A;  . ESOPHAGOGASTRODUODENOSCOPY (EGD) WITH PROPOFOL N/A 02/16/2020   Procedure: ESOPHAGOGASTRODUODENOSCOPY (EGD) WITH PROPOFOL;  Surgeon: Daneil Dolin, MD;  [facility];  Service: Endoscopy;  Laterality: N/A;  . FINGER FRACTURE SURGERY Left   . MULTIPLE EXTRACTIONS WITH ALVEOLOPLASTY N/A 06/15/2018   Procedure: Extraction of tooth #'s 2,12,13,18,20,and 21 with alveoloplasty and gross debridement of remaining teeth;  Surgeon: Lenn Cal, DDS;  Location: [name];  Service: Oral Surgery;  Laterality: N/A;  . PEG PLACEMENT N/A 08/04/2018   Procedure: PERCUTANEOUS ENDOSCOPIC GASTROSTOMY (PEG) PLACEMENT;  Surgeon: Aviva Signs, MD;  [facility];  Service: Gastroenterology;  Laterality: N/A;  . POLYPECTOMY  02/16/2020   Procedure: POLYPECTOMY;  Surgeon: Daneil Dolin, MD;  [facility];  Service: Endoscopy;;  . PORTACATH PLACEMENT Left 07/02/2018   Procedure: INSERTION PORT-A-CATH;  Surgeon: Aviva Signs, MD;  Location: [facility];  Service: General;  Laterality: Left;    Current Outpatient Medications  Medication Sig Dispense Refill  . Ensure Plus (ENSURE PLUS) LIQD Give via PEG. 2 bottles in morning. 2 in afternoon. 1 before bed. (Patient taking differently: Place 237 mLs into feeding tube 2 (two) times daily between meals. )  0  . ibuprofen (ADVIL,MOTRIN) 200 MG tablet Take 400 mg by mouth every 6 (six) hours as needed for moderate pain.     Marland Kitchen levothyroxine (SYNTHROID) 50 MCG tablet Take 1 tablet (50 mcg total) by mouth daily before breakfast. 30 tablet 3  . OVER THE COUNTER MEDICATION Naked juice  protein    . OVER THE COUNTER MEDICATION Take 1 Bottle by mouth daily. Bolthouse Farm Protein    . OVER THE COUNTER MEDICATION Take 1 Bottle by mouth daily. Bolthouse protein    . pregabalin (LYRICA) 150 MG capsule TAKE 1 CAPSULE (150 MG TOTAL) BY MOUTH 2 (TWO) TIMES DAILY. FEEDING TUBE 60 capsule 0   No current facility-administered medications for this visit.    Allergies as of 08/29/2020  . (No Known Allergies)    Family History  Problem Relation Age of Onset  . Diabetes Father        died at age 67   . Colon cancer Neg Hx     Social History   Socioeconomic History  . Marital status: Divorced    Spouse name: Not on file  . Number of children: Not on file  . Years of education: Not on file  . Highest education level: Not on file  Occupational History  . Occupation: veteran, retired  Tobacco Use  . Smoking status: Former Smoker    Years: 15.00    Types: Cigarettes    Quit date: 2000    Years since quitting: 21.7  .  Smokeless tobacco: Never Used  Vaping Use  . Vaping Use: Never used  Substance and Sexual Activity  . Alcohol use: Not Currently    Alcohol/week: 12.0 standard drinks    Types: 12 Cans of beer per week  . Drug use: Never  . Sexual activity: Not Currently  Other Topics Concern  . Not on file  Social History Narrative   Mr. 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  He lives independently with his dog here in [place].  He formally lived in [place].   Social Determinants of Health   Financial Resource Strain:   . Difficulty of Paying Living Expenses: Not on file  Food Insecurity:   . Worried About Charity fundraiser in the Last Year: Not on file  . Ran Out of Food in the Last Year: Not on file  Transportation Needs:   . Lack of Transportation (Medical): Not on file  . Lack of Transportation (Non-Medical): Not on file  Physical Activity:   . Days of Exercise per Week: Not on file  . Minutes of Exercise per Session: Not on file    Stress:   . Feeling of Stress : Not on file  Social Connections:   . Frequency of Communication with Friends and Family: Not on file  . Frequency of Social Gatherings with Friends and Family: Not on file  . Attends Religious Services: Not on file  . Active Member of Clubs or Organizations: Not on file  . Attends Archivist Meetings: Not on file  . Marital Status: Not on file    Subjective: Review of Systems  Constitutional: Negative for chills, fever, malaise/fatigue and weight loss.  HENT: Positive for congestion. Negative for sore throat.        Significant mucus and saliva production  Respiratory: Negative for cough and shortness of breath.   Cardiovascular: Negative for chest pain and palpitations.  Gastrointestinal: Negative for abdominal pain, blood in stool, diarrhea, melena, nausea and vomiting.       Dysphagia  Musculoskeletal: Negative for joint pain and myalgias.  Skin: Negative for rash.  Neurological: Negative for dizziness and weakness.  Endo/Heme/Allergies: Does not bruise/bleed easily.  Psychiatric/Behavioral: Negative for depression. The patient is not nervous/anxious.   All other systems reviewed and are negative.    Objective: BP (!) 108/57   Pulse (!) 54   Temp (!) 97.3 F (36.3 C)   Ht 5\' 8"  (1.727 m)   Wt 139 lb (63 kg)   BMI 21.13 kg/m  Physical Exam Vitals and nursing note reviewed.  Constitutional:      General: He is not in acute distress.    Appearance: Normal appearance. He is normal weight. He is not ill-appearing, toxic-appearing or diaphoretic.  HENT:     Head: Normocephalic and atraumatic.     Nose: No congestion or rhinorrhea.  Eyes:     General: No scleral icterus. Cardiovascular:     Rate and Rhythm: Normal rate and regular rhythm.     Heart sounds: Normal heart sounds.  Pulmonary:     Effort: Pulmonary effort is normal.     Breath sounds: Normal breath sounds.  Abdominal:     General: Bowel sounds are normal. There  is no distension.     Palpations: Abdomen is soft. There is no hepatomegaly, splenomegaly or mass.     Tenderness: There is no abdominal tenderness. There is no guarding or rebound.     Hernia: No hernia is  present.    Musculoskeletal:     Cervical back: Neck supple.  Skin:    General: Skin is warm and dry.     Coloration: Skin is not jaundiced.     Findings: No bruising or rash.  Neurological:     General: No focal deficit present.     Mental Status: He is alert and oriented to person, place, and time. Mental status is at baseline.  Psychiatric:        Mood and Affect: Mood normal.        Behavior: Behavior normal.        Thought Content: Thought content normal.      Assessment:  Very pleasant 69 year old male with an unfortunate history of tonsillar cancer.  He states he waited several years before getting it evaluated.  He subsequently had chemoradiation and has had significant radiation damage of his throat and esophagus with subsequent tube feed dependence and near complete dysphagia.  Attempted EGD at [facility] was unsuccessful.  Attempted EGD at [facility] was also unsuccessful.  IR plans for transgastric (through G-tube) EGD from a "bottom up approach".  Dysphagia with weight loss due to tonsillar cancer: Still with complete dysphagia, unable to tolerate oral feeds.  Uses G-tube which has since been changed out and is functional.  Using protein supplements and liquid smoothie type products such as Naked and Bolthouse.  Previous significant weight loss with his for cancer and subsequent complications, weight has since stabilized.  Recommend he keep his procedure appointment next month at [facility].  Continue using tube feeds for now   Plan: 1. Keep appointment at [facility] next month 2. Continue tube feeds 3. Call for any worsening problems 4. Follow-up in 6 months    Thank you for allowing [place] to participate in the care of [name] Simmonds, DNP,  AGNP-C Adult & Gerontological Nurse Practitioner [facility]   08/29/2020 11:49 AM   Disclaimer: This note was dictated with voice recognition software. Similar sounding words can inadvertently be transcribed and may not be corrected upon review.

## 2020-09-28 ENCOUNTER — Other Ambulatory Visit (HOSPITAL_COMMUNITY): Payer: Self-pay | Admitting: *Deleted

## 2020-09-28 DIAGNOSIS — C099 Malignant neoplasm of tonsil, unspecified: Secondary | ICD-10-CM

## 2020-09-28 MED ORDER — PREGABALIN 150 MG PO CAPS: 150.0000 mg | ORAL_CAPSULE | Freq: Two times a day (BID) | ORAL | 0 refills | Status: DC

## 2020-09-29 MED ORDER — PREGABALIN 150 MG PO CAPS: 150.0000 mg | ORAL_CAPSULE | Freq: Two times a day (BID) | ORAL | 0 refills | Status: AC

## 2020-10-04 ENCOUNTER — Other Ambulatory Visit (HOSPITAL_COMMUNITY): Payer: Self-pay | Admitting: *Deleted

## 2020-10-12 ENCOUNTER — Ambulatory Visit (HOSPITAL_COMMUNITY): Payer: Medicare Other

## 2020-10-12 NOTE — Progress Notes (Signed)
Nutrition Follow-up:  Patient with large left tonsillar mass, SCC.  Patient has completed treatment and continues with relying on 100% of nutrition coming from PEG tube.    Spoke with patient via phone for follow-up.  Patient reports nutrition is still the same. Provides 2 ensure plus shakes in the AM and 2 in the PM.  Continues to use a variety of other drinks in tube (Bolthouse, Naked, boost high protein, broth, tomato juice).  Gives prosource 1 oz daily.   Reports that he will be following up at [facility] to see if anything can be done to help him be able to eat solid foods again.     Medications: reviewed  Labs: no new  Anthropometrics:   Weight 139 lb on 9/8  7/26 at [facility] 136 lb 3/24 142 lb   Estimated Energy Needs  Kcals: 1750-2100 Protein: 88-105 g Fluid: > 1.7 L  NUTRITION DIAGNOSIS: Inability to eat continues but relying on feeding tube for nutrition   INTERVENTION:  Encouraged ensure plus, ideally 5 per day with 1 oz prosource and then add any additional other drinks via tube for more calories.   RD has discussed options for feeding with patient at past visits. Not interested in changing regimen at this time.   Patient to call RD if something changes in nutritional status.   Prescribed tube feeding will provide 1850 calories, 75 g protein Patient confirmed that he has RD contact information and will notify RD if anything changes.     NEXT VISIT: No follow-up, patient aware to notify RD if something changes in nutritional status  Pius Byrom B. Zenia Resides, [place], [place] Registered Dietitian [phone] (mobile)

## 2020-10-17 ENCOUNTER — Other Ambulatory Visit (HOSPITAL_COMMUNITY): Payer: Self-pay

## 2020-10-18 NOTE — Telephone Encounter (Signed)
Received refill request by fax but he is not a patient at [facility].

## 2020-10-19 ENCOUNTER — Other Ambulatory Visit (HOSPITAL_COMMUNITY): Payer: Self-pay | Admitting: *Deleted

## 2020-10-19 DIAGNOSIS — C099 Malignant neoplasm of tonsil, unspecified: Secondary | ICD-10-CM

## 2020-10-19 MED ORDER — LEVOTHYROXINE SODIUM 50 MCG PO TABS: 50.0000 ug | ORAL_TABLET | Freq: Every day | ORAL | 3 refills | Status: DC

## 2021-01-11 ENCOUNTER — Other Ambulatory Visit (HOSPITAL_COMMUNITY): Payer: Self-pay | Admitting: Hematology

## 2021-01-11 DIAGNOSIS — C099 Malignant neoplasm of tonsil, unspecified: Secondary | ICD-10-CM

## 2021-01-21 ENCOUNTER — Other Ambulatory Visit (HOSPITAL_COMMUNITY): Payer: Self-pay

## 2021-01-21 DIAGNOSIS — D708 Other neutropenia: Secondary | ICD-10-CM

## 2021-01-21 DIAGNOSIS — C099 Malignant neoplasm of tonsil, unspecified: Secondary | ICD-10-CM

## 2021-01-22 ENCOUNTER — Other Ambulatory Visit: Payer: Self-pay

## 2021-01-22 ENCOUNTER — Inpatient Hospital Stay (HOSPITAL_COMMUNITY): Payer: Medicare Other | Attending: Hematology

## 2021-01-22 DIAGNOSIS — Z87891 Personal history of nicotine dependence: Secondary | ICD-10-CM | POA: Insufficient documentation

## 2021-01-22 DIAGNOSIS — C099 Malignant neoplasm of tonsil, unspecified: Secondary | ICD-10-CM | POA: Diagnosis not present

## 2021-01-22 DIAGNOSIS — E039 Hypothyroidism, unspecified: Secondary | ICD-10-CM | POA: Diagnosis not present

## 2021-01-22 DIAGNOSIS — Z85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, oral cavity, and pharynx: Secondary | ICD-10-CM | POA: Diagnosis not present

## 2021-01-22 DIAGNOSIS — R0602 Shortness of breath: Secondary | ICD-10-CM | POA: Diagnosis not present

## 2021-01-22 DIAGNOSIS — Z79899 Other long term (current) drug therapy: Secondary | ICD-10-CM | POA: Insufficient documentation

## 2021-01-22 DIAGNOSIS — E785 Hyperlipidemia, unspecified: Secondary | ICD-10-CM | POA: Insufficient documentation

## 2021-01-22 DIAGNOSIS — D61818 Other pancytopenia: Secondary | ICD-10-CM | POA: Diagnosis not present

## 2021-01-22 DIAGNOSIS — G629 Polyneuropathy, unspecified: Secondary | ICD-10-CM | POA: Diagnosis not present

## 2021-01-22 DIAGNOSIS — D708 Other neutropenia: Secondary | ICD-10-CM

## 2021-01-22 DIAGNOSIS — Z923 Personal history of irradiation: Secondary | ICD-10-CM | POA: Diagnosis not present

## 2021-01-22 DIAGNOSIS — R1310 Dysphagia, unspecified: Secondary | ICD-10-CM | POA: Diagnosis not present

## 2021-01-22 DIAGNOSIS — J387 Other diseases of larynx: Secondary | ICD-10-CM | POA: Diagnosis not present

## 2021-01-22 DIAGNOSIS — D509 Iron deficiency anemia, unspecified: Secondary | ICD-10-CM | POA: Insufficient documentation

## 2021-01-22 LAB — CBC WITH DIFFERENTIAL/PLATELET
Abs Immature Granulocytes: 0.01 10*3/uL (ref 0.00–0.07)
Basophils Absolute: 0 10*3/uL (ref 0.0–0.1)
Basophils Relative: 1 %
Eosinophils Absolute: 0 10*3/uL (ref 0.0–0.5)
Eosinophils Relative: 1 %
HCT: 36.1 % — ABNORMAL LOW (ref 39.0–52.0)
Hemoglobin: 11.7 g/dL — ABNORMAL LOW (ref 13.0–17.0)
Immature Granulocytes: 1 %
Lymphocytes Relative: 22 %
Lymphs Abs: 0.4 10*3/uL — ABNORMAL LOW (ref 0.7–4.0)
MCH: 32.7 pg (ref 26.0–34.0)
MCHC: 32.4 g/dL (ref 30.0–36.0)
MCV: 100.8 fL — ABNORMAL HIGH (ref 80.0–100.0)
Monocytes Absolute: 0.3 10*3/uL (ref 0.1–1.0)
Monocytes Relative: 19 %
Neutro Abs: 0.9 10*3/uL — ABNORMAL LOW (ref 1.7–7.7)
Neutrophils Relative %: 56 %
Platelets: 151 10*3/uL (ref 150–400)
RBC: 3.58 MIL/uL — ABNORMAL LOW (ref 4.22–5.81)
RDW: 13.7 % (ref 11.5–15.5)
WBC: 1.6 10*3/uL — ABNORMAL LOW (ref 4.0–10.5)
nRBC: 0 % (ref 0.0–0.2)

## 2021-01-22 LAB — COMPREHENSIVE METABOLIC PANEL
ALT: 16 U/L (ref 0–44)
AST: 19 U/L (ref 15–41)
Albumin: 3.7 g/dL (ref 3.5–5.0)
Alkaline Phosphatase: 55 U/L (ref 38–126)
Anion gap: 7 (ref 5–15)
BUN: 21 mg/dL (ref 8–23)
CO2: 29 mmol/L (ref 22–32)
Calcium: 8.9 mg/dL (ref 8.9–10.3)
Chloride: 98 mmol/L (ref 98–111)
Creatinine, Ser: 0.83 mg/dL (ref 0.61–1.24)
GFR, Estimated: >60 mL/min (ref >60)
Glucose, Bld: 99 mg/dL (ref 70–99)
Potassium: 3.7 mmol/L (ref 3.5–5.1)
Sodium: 134 mmol/L — ABNORMAL LOW (ref 135–145)
Total Bilirubin: 0.6 mg/dL (ref 0.3–1.2)
Total Protein: 7 g/dL (ref 6.5–8.1)

## 2021-01-22 LAB — VITAMIN D 25 HYDROXY (VIT D DEFICIENCY, FRACTURES): Vit D, 25-Hydroxy: 36.27 ng/mL (ref 30–100)

## 2021-01-22 LAB — FERRITIN: Ferritin: 264 ng/mL (ref 24–336)

## 2021-01-22 LAB — TSH: TSH: 8.784 u[IU]/mL — ABNORMAL HIGH (ref 0.350–4.500)

## 2021-01-22 LAB — VITAMIN B12: Vitamin B-12: 1040 pg/mL — ABNORMAL HIGH (ref 180–914)

## 2021-01-22 LAB — IRON AND TIBC
Iron: 75 ug/dL (ref 45–182)
Saturation Ratios: 25 % (ref 17.9–39.5)
TIBC: 299 ug/dL (ref 250–450)
UIBC: 224 ug/dL

## 2021-01-22 LAB — LACTATE DEHYDROGENASE: LDH: 119 U/L (ref 98–192)

## 2021-01-29 ENCOUNTER — Inpatient Hospital Stay (HOSPITAL_BASED_OUTPATIENT_CLINIC_OR_DEPARTMENT_OTHER): Payer: Medicare Other | Admitting: Hematology

## 2021-01-29 ENCOUNTER — Other Ambulatory Visit: Payer: Self-pay

## 2021-01-29 ENCOUNTER — Encounter (HOSPITAL_COMMUNITY): Payer: Self-pay | Admitting: Hematology

## 2021-01-29 VITALS — BP 123/65 | HR 92 | Temp 98.6°F | Resp 18 | Wt 143.2 lb

## 2021-01-29 DIAGNOSIS — Z87891 Personal history of nicotine dependence: Secondary | ICD-10-CM | POA: Diagnosis not present

## 2021-01-29 DIAGNOSIS — D61818 Other pancytopenia: Secondary | ICD-10-CM | POA: Diagnosis not present

## 2021-01-29 DIAGNOSIS — R0602 Shortness of breath: Secondary | ICD-10-CM | POA: Diagnosis not present

## 2021-01-29 DIAGNOSIS — C099 Malignant neoplasm of tonsil, unspecified: Secondary | ICD-10-CM | POA: Diagnosis not present

## 2021-01-29 DIAGNOSIS — E785 Hyperlipidemia, unspecified: Secondary | ICD-10-CM | POA: Diagnosis not present

## 2021-01-29 DIAGNOSIS — E039 Hypothyroidism, unspecified: Secondary | ICD-10-CM | POA: Diagnosis not present

## 2021-01-29 DIAGNOSIS — Z79899 Other long term (current) drug therapy: Secondary | ICD-10-CM | POA: Diagnosis not present

## 2021-01-29 DIAGNOSIS — Z923 Personal history of irradiation: Secondary | ICD-10-CM | POA: Diagnosis not present

## 2021-01-29 DIAGNOSIS — D509 Iron deficiency anemia, unspecified: Secondary | ICD-10-CM | POA: Diagnosis not present

## 2021-01-29 DIAGNOSIS — G629 Polyneuropathy, unspecified: Secondary | ICD-10-CM | POA: Diagnosis not present

## 2021-01-29 DIAGNOSIS — Z85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, oral cavity, and pharynx: Secondary | ICD-10-CM | POA: Diagnosis not present

## 2021-01-29 NOTE — Progress Notes (Signed)
[name], [name] [id number]   CLINIC:  Medical Oncology/Hematology  PCP:  [name], DO [address] [place] [address] [phone]   REASON FOR VISIT:  Follow-up for left tonsillar cancer  PRIOR THERAPY: Concurrent chemoradiation with carboplatin and 5-FU x 3 cycles from 07/13/2018 to 09/13/2018  NGS Results: Not done  CURRENT THERAPY: Surveillance  BRIEF ONCOLOGIC HISTORY:  Oncology History  Tonsillar cancer ([place])  06/02/2018 Initial Diagnosis   Tonsillar cancer ([place])   07/13/2018 - 09/17/2018 Chemotherapy   The patient had palonosetron (ALOXI) injection 0.25 mg, 0.25 mg, Intravenous,  Once, 3 of 3 cycles Administration: 0.25 mg (07/13/2018), 0.25 mg (07/15/2018), 0.25 mg (08/10/2018), 0.25 mg (09/13/2018) fluorouracil (ADRUCIL) 4,750 mg in sodium chloride 0.9 % 55 mL chemo infusion, 600 mg/m2/day = 4,750 mg (100 % of original dose 600 mg/m2/day), Intravenous, 4D (96 hours ), 3 of 3 cycles Dose modification: 600 mg/m2/day (original dose 600 mg/m2/day, Cycle 1, Reason: Other (see comments), Comment: french protocol head and neck) Administration: 4,750 mg (07/13/2018), 4,750 mg (08/10/2018), 4,750 mg (09/13/2018)  for chemotherapy treatment.      CANCER STAGING: Cancer Staging Tonsillar cancer ([place]) Staging form: Pharynx - HPV-Mediated Oropharynx, AJCC 8th Edition - Clinical stage from 06/02/2018: Stage II (cT3, cN2, cM0) - Unsigned   INTERVAL HISTORY:  [name], a 70 y.o. male, returns for routine follow-up of his left tonsillar cancer. [name] was last contacted via telephone by [name] Finders, NP, on 07/26/2020.   Today he reports feeling poorly. He has neck stiffness and pain and is not able to rotate his head as much lateral, flexion and extension. He is not able to swallow and has to spit up his saliva. He also is having numbness and tingling in the left side of his face and neck. He denies having any recent  infections in the past 6 months. He continues having SOB with exertion. He is getting 2 Ensures Plus through his PEG tube in the morning and in the evening, along with 2 other protein shakes.  He is able to drive, but needs an invalid placard for his car.   REVIEW OF SYSTEMS:  Review of Systems  Constitutional: Positive for appetite change (PEG tube) and fatigue (depleted).  HENT:   Positive for hearing loss (d/t radiation) and trouble swallowing (spits up frothy).   Respiratory: Positive for shortness of breath (w/ exertion).   Genitourinary: Positive for frequency.   Musculoskeletal: Positive for neck pain (4/10 L neck pain post radiation & neck stiffness).  Neurological: Positive for dizziness, headaches and numbness (+ tingling on L side of face).  All other systems reviewed and are negative.   PAST MEDICAL/SURGICAL HISTORY:  Past Medical History:  Diagnosis Date  . HOH (hard of hearing)   . Hyperlipidemia   . Mass of oral cavity 05/12/2018  . Positive FIT (fecal immunochemical test) 05/12/2018  . PTSD (post-traumatic stress disorder)    has not been offically diagnosised  . Tonsillar cancer ([place]) 06/02/2018   Past Surgical History:  Procedure Laterality Date  . APPENDECTOMY  1969  . COLONOSCOPY W/ POLYPECTOMY     remote past  . COLONOSCOPY WITH PROPOFOL N/A 02/16/2020   Procedure: COLONOSCOPY WITH PROPOFOL;  Surgeon: [name] Dolin, MD;  [facility];  Service: Endoscopy;  Laterality: N/A;  1:45pm  . ESOPHAGOGASTRODUODENOSCOPY  08/04/2018   Dr. Arnoldo Juarez: erythema of larynx, normal stomach, normal duodenum. PEG Placed 20 F,  bumper at 2.5 cm  . ESOPHAGOGASTRODUODENOSCOPY (EGD) WITH PROPOFOL N/A 08/04/2018   Procedure: ESOPHAGOGASTRODUODENOSCOPY (EGD) WITH PROPOFOL;  Surgeon: [name] Signs, MD;  [facility];  Service: Gastroenterology;  Laterality: N/A;  . ESOPHAGOGASTRODUODENOSCOPY (EGD) WITH PROPOFOL N/A 02/16/2020   Procedure: ESOPHAGOGASTRODUODENOSCOPY  (EGD) WITH PROPOFOL;  Surgeon: [name] Dolin, MD;  [facility];  Service: Endoscopy;  Laterality: N/A;  . FINGER FRACTURE SURGERY Left   . MULTIPLE EXTRACTIONS WITH ALVEOLOPLASTY N/A 06/15/2018   Procedure: Extraction of tooth #'s 2,12,13,18,20,and 21 with alveoloplasty and gross debridement of remaining teeth;  Surgeon: [name], DDS;  Location: [facility];  Service: Oral Surgery;  Laterality: N/A;  . PEG PLACEMENT N/A 08/04/2018   Procedure: PERCUTANEOUS ENDOSCOPIC GASTROSTOMY (PEG) PLACEMENT;  Surgeon: [name] Signs, MD;  [facility];  Service: Gastroenterology;  Laterality: N/A;  . POLYPECTOMY  02/16/2020   Procedure: POLYPECTOMY;  Surgeon: [name] Dolin, MD;  [facility];  Service: Endoscopy;;  . PORTACATH PLACEMENT Left 07/02/2018   Procedure: INSERTION PORT-A-CATH;  Surgeon: [name] Signs, MD;  Location: [facility];  Service: General;  Laterality: Left;    SOCIAL HISTORY:  Social History   Socioeconomic History  . Marital status: Divorced    Spouse name: Not on file  . Number of children: Not on file  . Years of education: Not on file  . Highest education level: Not on file  Occupational History  . Occupation: veteran, retired  Tobacco Use  . Smoking status: Former Smoker    Years: 15.00    Types: Cigarettes    Quit date: 2000    Years since quitting: 22.1  . Smokeless tobacco: Never Used  Vaping Use  . Vaping Use: Never used  Substance and Sexual Activity  . Alcohol use: Not Currently    Alcohol/week: 12.0 standard drinks    Types: 12 Cans of beer per week  . Drug use: Never  . Sexual activity: Not Currently  Other Topics Concern  . Not on file  Social History Narrative   Mr. 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  He lives independently with his dog here in [place].  He formally lived in [place].   Social Determinants of Health   Financial Resource Strain: Not on file  Food Insecurity: Not on file   Transportation Needs: Not on file  Physical Activity: Not on file  Stress: Not on file  Social Connections: Not on file  Intimate Partner Violence: Not on file    FAMILY HISTORY:  Family History  Problem Relation Age of Onset  . Diabetes Father        died at age 7   . Colon cancer Neg Hx     CURRENT MEDICATIONS:  Current Outpatient Medications  Medication Sig Dispense Refill  . Ensure Plus (ENSURE PLUS) LIQD Give via PEG. 2 bottles in morning. 2 in afternoon. 1 before bed. (Patient taking differently: Place 237 mLs into feeding tube 2 (two) times daily between meals. Pt uses 2 cans in the morning and 2 in the evening via peg tube)  0  . gabapentin (NEURONTIN) 250 MG/5ML solution Take 250 mg by mouth 2 (two) times daily.    Marland Kitchen ibuprofen (ADVIL,MOTRIN) 200 MG tablet Take 400 mg by mouth every 6 (six) hours as needed for moderate pain.     Marland Kitchen levothyroxine (SYNTHROID) 50 MCG tablet TAKE 1 TABLET BY MOUTH DAILY BEFORE BREAKFAST 90 tablet 1  . OVER THE  COUNTER MEDICATION Naked juice protein    . OVER THE COUNTER MEDICATION Take 1 Bottle by mouth daily. [place] [employer] Protein    . OVER THE COUNTER MEDICATION Take 1 Bottle by mouth daily. [place] protein    . pregabalin (LYRICA) 150 MG capsule Take 1 capsule (150 mg total) by mouth 2 (two) times daily. Feeding tube 60 capsule 0   No current facility-administered medications for this visit.    ALLERGIES:  No Known Allergies  PHYSICAL EXAM:  Performance status (ECOG): 1 - Symptomatic but completely ambulatory  Vitals:   01/29/21 1513  BP: 123/65  Pulse: 92  Resp: 18  Temp: 98.6 F (37 C)  SpO2: 99%   Wt Readings from Last 3 Encounters:  01/29/21 143 lb 3.2 oz (65 kg)  08/29/20 139 lb (63 kg)  03/14/20 143 lb (64.9 kg)   Physical Exam Vitals reviewed.  Constitutional:      Appearance: Normal appearance.  HENT:     Mouth/Throat:     Lips: No lesions.     Mouth: No oral lesions.     Tongue: No lesions.     Comments:  Decreased mouth aperture Cardiovascular:     Rate and Rhythm: Normal rate and regular rhythm.     Pulses: Normal pulses.     Heart sounds: Normal heart sounds.  Pulmonary:     Effort: Pulmonary effort is normal.     Breath sounds: Normal breath sounds.  Abdominal:     Comments: PEG tube  Musculoskeletal:     Cervical back: Decreased range of motion (on lateral rotation, flexion & extension).  Lymphadenopathy:     Cervical: No cervical adenopathy.     Right cervical: No superficial cervical adenopathy.    Left cervical: No superficial cervical adenopathy.  Neurological:     General: No focal deficit present.     Mental Status: He is alert and oriented to person, place, and time.  Psychiatric:        Mood and Affect: Mood normal.        Behavior: Behavior normal.      LABORATORY DATA:  I have reviewed the labs as listed.  CBC Latest Ref Rng & Units 01/22/2021 07/23/2020 03/07/2020  WBC 4.0 - 10.5 K/uL 1.6(L) 1.9(L) 1.5(L)  Hemoglobin 13.0 - 17.0 g/dL 11.7(L) 12.2(L) 12.0(L)  Hematocrit 39.0 - 52.0 % 36.1(L) 37.9(L) 36.9(L)  Platelets 150 - 400 K/uL 151 165 139(L)   CMP Latest Ref Rng & Units 01/22/2021 03/07/2020 02/14/2020  Glucose 70 - 99 mg/dL 99 102(H) 102(H)  BUN 8 - 23 mg/dL _0 Creatinine 0.61 - 1.24 mg/dL 0.83 0.82 0.87  Sodium 135 - 145 mmol/L 134(L) 137 136  Potassium 3.5 - 5.1 mmol/L 3.7 3.9 3.9  Chloride 98 - 111 mmol/L 98 98 99  CO2 22 - 32 mmol/L _1 Calcium 8.9 - 10.3 mg/dL 8.9 9.2 9.1  Total Protein 6.5 - 8.1 g/dL 7.0 6.7 -  Total Bilirubin 0.3 - 1.2 mg/dL 0.6 0.9 -  Alkaline Phos 38 - 126 U/L 55 52 -  AST 15 - 41 U/L 19 32 -  ALT 0 - 44 U/L 16 17 -   Lab Results  Component Value Date   LDH 119 01/22/2021   LDH 119 07/23/2020   LDH 114 10/15/2018   Lab Results  Component Value Date   VD25OH 36.27 01/22/2021   Lab Results  Component Value Date   TIBC 299 01/22/2021   FERRITIN  264 01/22/2021   IRONPCTSAT 25 01/22/2021    DIAGNOSTIC  IMAGING:  I have independently reviewed the scans and discussed with the patient. No results found.   ASSESSMENT:  1.  Left tonsil squamous cell carcinoma, stage IVa (T3 N2 M0): -3 cycles of carboplatin and 5-FU with radiation from 07/13/2018 through 09/13/2018. -Posttreatment PET scan on 02/07/2019 showed resolution of the left tonsillar mass and neck adenopathy with no active malignancy. -We reviewed results of CT soft tissue neck from 03/07/2020 which did not show any evidence of residual or recurrent disease.  Posttreatment changes in the oropharynx and hypopharynx. -EGD on 02/16/2020 was aborted secondary to radiation changes of the hypopharynx.  Utilizing the slim scope, edematous appearing arytenoids, cricopharyngeal opening identified, hypopharyngeal opening not apparent.  Unable to find the esophageal inlet.  Procedure was abandoned as unable to intubate esophagus.   2.  Hypothyroidism: -He is on Synthroid 50 mcg daily  3.  Nutrition: -He has PEG tube.   PLAN:  1.  Left tonsil squamous cell carcinoma, stage IVa (T3 N2 M0): -Radiation fibrosis of the neck, arm mostly on the left side. -Limited motion of the head due to fibrosis present.  Restricted opening of the mouth. -Physical examination did not reveal any palpable adenopathy. -Recommend follow-up in 6 months with repeat CT scan of the neck and blood work.  2.  Hypothyroidism: -His TSH is 8.7 today.  Continue Synthroid 50 mcg daily.  3.  Nutrition: -He is taking in 2 ensures in the morning and 2 ensures in the evening via PEG tube.  He is also taking into other protein shakes. -His weight is stable around 143.  4.  Pancytopenia: -He developed leukopenia after completion of chemoradiation. -Today white count is 1.6 with ANC of 900.  Platelet count is normal.  X50 and folic acid was within normal limits. -If there is any worsening of the counts, will consider bone marrow biopsy.  5.  Neuropathic pains in the  shoulder: -Continue gabapentin liquid.    Orders placed this encounter:  No orders of the defined types were placed in this encounter.    Derek Jack, MD [place] [phone]   I, Milinda Antis, am acting as a scribe for Dr. Sanda Linger.  I, Derek Jack MD, have reviewed the above documentation for accuracy and completeness, and I agree with the above.

## 2021-01-29 NOTE — Patient Instructions (Signed)
[place] at [facility] Discharge Instructions  You were seen today by Dr. Delton Coombes. He went over your recent results and scan. You will be scheduled for a CT scan of your neck before your next visit. Dr. Delton Coombes will see you back in 6 months for labs and follow up.   Thank you for choosing [facility] at [facility] to provide your oncology and hematology care.  To afford each patient quality time with our provider, please arrive at least 15 minutes before your scheduled appointment time.   If you have a lab appointment with the [facility] please come in thru the Main Entrance and check in at the main information desk  You need to re-schedule your appointment should you arrive 10 or more minutes late.  We strive to give you quality time with our providers, and arriving late affects you and other patients whose appointments are after yours.  Also, if you no show three or more times for appointments you may be dismissed from the clinic at the providers discretion.     Again, thank you for choosing [facility].  Our hope is that these requests will decrease the amount of time that you wait before being seen by our physicians.       _____________________________________________________________  Should you have questions after your visit to [facility], please contact our office at [phone] between the hours of 8:00 a.m. and 4:30 p.m.  Voicemails left after 4:00 p.m. will not be returned until the following business day.  For prescription refill requests, have your pharmacy contact our office and allow 72 hours.    Cancer Center Support Programs:   > Cancer Support Group  2nd Tuesday of the month 1pm-2pm, Journey Room

## 2021-01-30 ENCOUNTER — Other Ambulatory Visit (HOSPITAL_COMMUNITY): Payer: Self-pay

## 2021-01-30 DIAGNOSIS — C099 Malignant neoplasm of tonsil, unspecified: Secondary | ICD-10-CM

## 2021-02-19 DIAGNOSIS — M2569 Stiffness of other specified joint, not elsewhere classified: Secondary | ICD-10-CM | POA: Diagnosis not present

## 2021-02-19 DIAGNOSIS — I890 Lymphedema, not elsewhere classified: Secondary | ICD-10-CM | POA: Diagnosis not present

## 2021-02-19 DIAGNOSIS — C099 Malignant neoplasm of tonsil, unspecified: Secondary | ICD-10-CM | POA: Diagnosis not present

## 2021-02-21 ENCOUNTER — Encounter: Payer: Self-pay | Admitting: Internal Medicine

## 2021-02-21 ENCOUNTER — Ambulatory Visit: Payer: Medicare Other | Admitting: Nurse Practitioner

## 2021-02-26 DIAGNOSIS — I890 Lymphedema, not elsewhere classified: Secondary | ICD-10-CM | POA: Diagnosis not present

## 2021-02-26 DIAGNOSIS — M2569 Stiffness of other specified joint, not elsewhere classified: Secondary | ICD-10-CM | POA: Diagnosis not present

## 2021-02-26 DIAGNOSIS — C099 Malignant neoplasm of tonsil, unspecified: Secondary | ICD-10-CM | POA: Diagnosis not present

## 2021-02-27 DIAGNOSIS — C099 Malignant neoplasm of tonsil, unspecified: Secondary | ICD-10-CM | POA: Diagnosis not present

## 2021-02-27 DIAGNOSIS — M2569 Stiffness of other specified joint, not elsewhere classified: Secondary | ICD-10-CM | POA: Diagnosis not present

## 2021-02-27 DIAGNOSIS — I890 Lymphedema, not elsewhere classified: Secondary | ICD-10-CM | POA: Diagnosis not present

## 2021-03-01 DIAGNOSIS — J387 Other diseases of larynx: Secondary | ICD-10-CM | POA: Diagnosis not present

## 2021-03-01 DIAGNOSIS — C099 Malignant neoplasm of tonsil, unspecified: Secondary | ICD-10-CM | POA: Diagnosis not present

## 2021-03-01 DIAGNOSIS — R1310 Dysphagia, unspecified: Secondary | ICD-10-CM | POA: Diagnosis not present

## 2021-03-12 DIAGNOSIS — C099 Malignant neoplasm of tonsil, unspecified: Secondary | ICD-10-CM | POA: Diagnosis not present

## 2021-03-12 DIAGNOSIS — I890 Lymphedema, not elsewhere classified: Secondary | ICD-10-CM | POA: Diagnosis not present

## 2021-03-12 DIAGNOSIS — M2569 Stiffness of other specified joint, not elsewhere classified: Secondary | ICD-10-CM | POA: Diagnosis not present

## 2021-03-14 ENCOUNTER — Inpatient Hospital Stay (HOSPITAL_COMMUNITY): Payer: Medicare Other | Attending: Hematology | Admitting: Hematology

## 2021-03-14 ENCOUNTER — Other Ambulatory Visit: Payer: Self-pay

## 2021-03-14 VITALS — BP 107/65 | HR 75 | Temp 98.7°F | Resp 17 | Wt 138.5 lb

## 2021-03-14 DIAGNOSIS — D61818 Other pancytopenia: Secondary | ICD-10-CM | POA: Insufficient documentation

## 2021-03-14 DIAGNOSIS — T66XXXA Radiation sickness, unspecified, initial encounter: Secondary | ICD-10-CM | POA: Insufficient documentation

## 2021-03-14 DIAGNOSIS — M25519 Pain in unspecified shoulder: Secondary | ICD-10-CM | POA: Insufficient documentation

## 2021-03-14 DIAGNOSIS — C099 Malignant neoplasm of tonsil, unspecified: Secondary | ICD-10-CM

## 2021-03-14 DIAGNOSIS — Z87891 Personal history of nicotine dependence: Secondary | ICD-10-CM | POA: Insufficient documentation

## 2021-03-14 DIAGNOSIS — R531 Weakness: Secondary | ICD-10-CM | POA: Diagnosis not present

## 2021-03-14 DIAGNOSIS — Z79899 Other long term (current) drug therapy: Secondary | ICD-10-CM | POA: Insufficient documentation

## 2021-03-14 DIAGNOSIS — Z85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, oral cavity, and pharynx: Secondary | ICD-10-CM | POA: Diagnosis not present

## 2021-03-14 DIAGNOSIS — M542 Cervicalgia: Secondary | ICD-10-CM | POA: Diagnosis not present

## 2021-03-14 DIAGNOSIS — R200 Anesthesia of skin: Secondary | ICD-10-CM | POA: Diagnosis not present

## 2021-03-14 DIAGNOSIS — Z9221 Personal history of antineoplastic chemotherapy: Secondary | ICD-10-CM | POA: Diagnosis not present

## 2021-03-14 DIAGNOSIS — E785 Hyperlipidemia, unspecified: Secondary | ICD-10-CM | POA: Diagnosis not present

## 2021-03-14 DIAGNOSIS — E039 Hypothyroidism, unspecified: Secondary | ICD-10-CM | POA: Insufficient documentation

## 2021-03-14 DIAGNOSIS — Z923 Personal history of irradiation: Secondary | ICD-10-CM | POA: Diagnosis not present

## 2021-03-14 DIAGNOSIS — R5383 Other fatigue: Secondary | ICD-10-CM | POA: Insufficient documentation

## 2021-03-14 DIAGNOSIS — R252 Cramp and spasm: Secondary | ICD-10-CM | POA: Diagnosis not present

## 2021-03-14 NOTE — Progress Notes (Signed)
[facility], [place] [address]   CLINIC:  Medical Oncology/Hematology  PCP:  Gwenlyn Perking, [place] / [place] [address] [phone]   REASON FOR VISIT:  Follow-up for left tonsillar cancer  PRIOR THERAPY: Concurrent chemoradiation with carboplatin and 5-FU x 3 cycles from 07/13/2018 to 09/13/2018  NGS Results: Not done  CURRENT THERAPY: Surveillance  BRIEF ONCOLOGIC HISTORY:  Oncology History  Tonsillar cancer ([place])  06/02/2018 Initial Diagnosis   Tonsillar cancer ([place])   07/13/2018 - 09/17/2018 Chemotherapy   The patient had palonosetron (ALOXI) injection 0.25 mg, 0.25 mg, Intravenous,  Once, 3 of 3 cycles Administration: 0.25 mg (07/13/2018), 0.25 mg (07/15/2018), 0.25 mg (08/10/2018), 0.25 mg (09/13/2018) fluorouracil (ADRUCIL) 4,750 mg in sodium chloride 0.9 % 55 mL chemo infusion, 600 mg/m2/day = 4,750 mg (100 % of original dose 600 mg/m2/day), Intravenous, 4D (96 hours ), 3 of 3 cycles Dose modification: 600 mg/m2/day (original dose 600 mg/m2/day, Cycle 1, Reason: Other (see comments), Comment: french protocol head and neck) Administration: 4,750 mg (07/13/2018), 4,750 mg (08/10/2018), 4,750 mg (09/13/2018)  for chemotherapy treatment.      CANCER STAGING: Cancer Staging Tonsillar cancer ([place]) Staging form: Pharynx - HPV-Mediated Oropharynx, AJCC 8th Edition - Clinical stage from 06/02/2018: Stage II (cT3, cN2, cM0) - Unsigned   INTERVAL HISTORY:  Mr. [name], a 70 y.o. male, returns for routine follow-up of his left tonsillar cancer. [name] was last seen on 01/29/2021.   Today he reports feeling fair. He reports that he was coughing up blood after a coughing spell on the evening of 03/18, which is the second time that this has happened in 2022. His cough is stable and he notes he has trouble reclining on his back due to the secretions. He also reports having tingling in the left side of neck which is now extending to  the right side; he is taking gabapentin and Lyrica. He is doing exercises with the PT from [facility] to improve his neck mobility and swallowing effort. He also complains of his left shoulder hurting after holding up his cans for tube feeds; he denies having shoulder pains previously.  He is going to see his PCP on 04/01.   REVIEW OF SYSTEMS:  Review of Systems  Constitutional: Positive for appetite change and fatigue.  Respiratory: Positive for cough (productive) and hemoptysis (x1 episode after coughing spell).   Musculoskeletal: Positive for arthralgias (L shoulder pain after prolonged flexion).  Neurological: Positive for numbness (tingling in L neck).  Psychiatric/Behavioral: Positive for sleep disturbance.  All other systems reviewed and are negative.   PAST MEDICAL/SURGICAL HISTORY:  Past Medical History:  Diagnosis Date   HOH (hard of hearing)    Hyperlipidemia    Mass of oral cavity 05/12/2018   Positive FIT (fecal immunochemical test) 05/12/2018   PTSD (post-traumatic stress disorder)    has not been offically diagnosised   Tonsillar cancer ([place]) 06/02/2018   Past Surgical History:  Procedure Laterality Date   APPENDECTOMY  1969   COLONOSCOPY W/ POLYPECTOMY     remote past   COLONOSCOPY WITH PROPOFOL N/A 02/16/2020   Procedure: COLONOSCOPY WITH PROPOFOL;  Surgeon: Daneil Dolin, MD;  [facility];  Service: Endoscopy;  Laterality: N/A;  1:45pm   ESOPHAGOGASTRODUODENOSCOPY  08/04/2018   Dr. Arnoldo Morale: erythema of larynx, normal stomach, normal duodenum. PEG Placed 20 F, bumper at 2.5 cm   ESOPHAGOGASTRODUODENOSCOPY (EGD) WITH PROPOFOL N/A 08/04/2018  Procedure: ESOPHAGOGASTRODUODENOSCOPY (EGD) WITH PROPOFOL;  Surgeon: Aviva Signs, MD;  [facility];  Service: Gastroenterology;  Laterality: N/A;   ESOPHAGOGASTRODUODENOSCOPY (EGD) WITH PROPOFOL N/A 02/16/2020   Procedure: ESOPHAGOGASTRODUODENOSCOPY (EGD) WITH PROPOFOL;  Surgeon: Daneil Dolin, MD;  [facility];  Service: Endoscopy;  Laterality: N/A;   FINGER FRACTURE SURGERY Left    MULTIPLE EXTRACTIONS WITH ALVEOLOPLASTY N/A 06/15/2018   Procedure: Extraction of tooth #'s 2,12,13,18,20,and 21 with alveoloplasty and gross debridement of remaining teeth;  Surgeon: Lenn Cal, DDS;  Location: [facility];  Service: Oral Surgery;  Laterality: N/A;   PEG PLACEMENT N/A 08/04/2018   Procedure: PERCUTANEOUS ENDOSCOPIC GASTROSTOMY (PEG) PLACEMENT;  Surgeon: Aviva Signs, MD;  [facility];  Service: Gastroenterology;  Laterality: N/A;   POLYPECTOMY  02/16/2020   Procedure: POLYPECTOMY;  Surgeon: Daneil Dolin, MD;  [facility];  Service: Endoscopy;;   PORTACATH PLACEMENT Left 07/02/2018   Procedure: INSERTION PORT-A-CATH;  Surgeon: Aviva Signs, MD;  Location: [facility];  Service: General;  Laterality: Left;    SOCIAL HISTORY:  Social History   Socioeconomic History   Marital status: Divorced    Spouse name: Not on file   Number of children: Not on file   Years of education: Not on file   Highest education level: Not on file  Occupational History   Occupation: veteran, retired  Tobacco Use   Smoking status: Former Smoker    Years: 15.00    Types: Cigarettes    Quit date: 2000    Years since quitting: 22.2   Smokeless tobacco: Never Used  Scientific laboratory technician Use: Never used  Substance and Sexual Activity   Alcohol use: Not Currently    Alcohol/week: 12.0 standard drinks    Types: 12 Cans of beer per week   Drug use: Never   Sexual activity: Not Currently  Other Topics Concern   Not on file  Social History Narrative   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  He lives independently with his dog here in [place].  He formally lived in [place].   Social Determinants of Health   Financial Resource Strain: Not on file  Food Insecurity: Not on file  Transportation Needs: Not on file  Physical  Activity: Not on file  Stress: Not on file  Social Connections: Not on file  Intimate Partner Violence: Not on file    FAMILY HISTORY:  Family History  Problem Relation Age of Onset   Diabetes Father        died at age 72    Colon cancer Neg Hx     CURRENT MEDICATIONS:  Current Outpatient Medications  Medication Sig Dispense Refill   Ensure Plus (ENSURE PLUS) LIQD Give via PEG. 2 bottles in morning. 2 in afternoon. 1 before bed. (Patient taking differently: Place 237 mLs into feeding tube 2 (two) times daily between meals. Pt uses 2 cans in the morning and 2 in the evening via peg tube)  0   gabapentin (NEURONTIN) 250 MG/5ML solution Take 250 mg by mouth 2 (two) times daily.     ibuprofen (ADVIL,MOTRIN) 200 MG tablet Take 400 mg by mouth every 6 (six) hours as needed for moderate pain.      levothyroxine (SYNTHROID) 50 MCG tablet TAKE 1 TABLET BY MOUTH DAILY BEFORE BREAKFAST 90 tablet 1   OVER THE COUNTER MEDICATION Naked juice protein     OVER THE COUNTER MEDICATION Take  1 Bottle by mouth daily. [place] Protein     OVER THE COUNTER MEDICATION Take 1 Bottle by mouth daily. [place] protein     pregabalin (LYRICA) 150 MG capsule Take 1 capsule (150 mg total) by mouth 2 (two) times daily. Feeding tube 60 capsule 0   No current facility-administered medications for this visit.    ALLERGIES:  No Known Allergies  PHYSICAL EXAM:  Performance status (ECOG): 1 - Symptomatic but completely ambulatory  Vitals:   03/14/21 1310  BP: 107/65  Pulse: 75  Resp: 17  Temp: 98.7 F (37.1 C)  SpO2: 95%   Wt Readings from Last 3 Encounters:  03/14/21 138 lb 8 oz (62.8 kg)  01/29/21 143 lb 3.2 oz (65 kg)  08/29/20 139 lb (63 kg)   Physical Exam Vitals reviewed.  Constitutional:      Appearance: Normal appearance.  Cardiovascular:     Rate and Rhythm: Normal rate and regular rhythm.     Pulses: Normal pulses.     Heart sounds: Normal heart sounds.  Pulmonary:      Effort: Pulmonary effort is normal.     Breath sounds: Normal breath sounds.  Abdominal:     Comments: PEG tube  Neurological:     General: No focal deficit present.     Mental Status: He is alert and oriented to person, place, and time.  Psychiatric:        Mood and Affect: Mood normal.        Behavior: Behavior normal.      LABORATORY DATA:  I have reviewed the labs as listed.  CBC Latest Ref Rng & Units 01/22/2021 07/23/2020 03/07/2020  WBC 4.0 - 10.5 K/uL 1.6(L) 1.9(L) 1.5(L)  Hemoglobin 13.0 - 17.0 g/dL 11.7(L) 12.2(L) 12.0(L)  Hematocrit 39.0 - 52.0 % 36.1(L) 37.9(L) 36.9(L)  Platelets 150 - 400 K/uL 151 165 139(L)   CMP Latest Ref Rng & Units 01/22/2021 03/07/2020 02/14/2020  Glucose 70 - 99 mg/dL 99 102(H) 102(H)  BUN 8 - 23 mg/dL 21 18 14   Creatinine 0.61 - 1.24 mg/dL 0.83 0.82 0.87  Sodium 135 - 145 mmol/L 134(L) 137 136  Potassium 3.5 - 5.1 mmol/L 3.7 3.9 3.9  Chloride 98 - 111 mmol/L 98 98 99  CO2 22 - 32 mmol/L 29 30 29   Calcium 8.9 - 10.3 mg/dL 8.9 9.2 9.1  Total Protein 6.5 - 8.1 g/dL 7.0 6.7 -  Total Bilirubin 0.3 - 1.2 mg/dL 0.6 0.9 -  Alkaline Phos 38 - 126 U/L 55 52 -  AST 15 - 41 U/L 19 32 -  ALT 0 - 44 U/L 16 17 -   Lab Results  Component Value Date   LDH 119 01/22/2021   LDH 119 07/23/2020   LDH 114 10/15/2018   Lab Results  Component Value Date   VD25OH 36.27 01/22/2021   Lab Results  Component Value Date   TIBC 299 01/22/2021   FERRITIN 264 01/22/2021   IRONPCTSAT 25 01/22/2021    DIAGNOSTIC IMAGING:  I have independently reviewed the scans and discussed with the patient. No results found.   ASSESSMENT:  1. Left tonsil squamous cell carcinoma, stage IVa (T3 N2 M0): -3 cycles of carboplatin and 5-FU with radiation from 07/13/2018 through 09/13/2018. -Posttreatment PET scan on 02/07/2019 showed resolution of the left tonsillar mass and neck adenopathy with no active malignancy. -We reviewed results of CT soft tissue neck from 03/07/2020 which did  not show any evidence of residual or recurrent disease.  Posttreatment changes in the oropharynx and hypopharynx. -EGD on 02/16/2020 was aborted secondary to radiation changes of the hypopharynx. Utilizing the slim scope, edematous appearing arytenoids, cricopharyngeal opening identified, hypopharyngeal opening not apparent. Unable to find the esophageal inlet. Procedure was abandoned as unable to intubate esophagus.  2. Hypothyroidism: -He is on Synthroid 50 mcg daily  3. Nutrition: -He has PEG tube.   PLAN:  1. Left tonsil squamous cell carcinoma, stage IVa (T3 N2 M0): -Unfortunately he has developed radiation fibrosis and trismus.  He also reported left shoulder numbness while tube feeding. -He had a spell of cough on 03/08/2021 with resulting hemoptysis.  I have talked to Dr. Adella Nissen who has sent the patient back to our clinic. -I have recommended doing a CT of the neck and chest.  I will see him back after the CT scans.  2. Hypothyroidism: -Continue Synthroid 50 mcg daily.  Last TSH was 8.7.  3. Nutrition: -Continue tube feeds via PEG tube. -He is unable to eat anything by mouth.  4. Pancytopenia: -He developed leukopenia since after completion of chemoradiation.  Last white count was 1.6.  He does not have any recurrent infections.  Nutritional deficiency work-up was normal.  5. Neuropathic pains in the neck and shoulder: -Reports shooting pains in the neck and shoulder on and off.  Continue liquid gabapentin.   Orders placed this encounter:  Orders Placed This Encounter  Procedures   CT SOFT TISSUE NECK W CONTRAST   CT Chest [place], [place] [phone]   I, Milinda Antis, am acting as a scribe for Dr. Sanda Linger.  I, Derek Jack MD, have reviewed the above documentation for accuracy and completeness, and I agree with the above.

## 2021-03-14 NOTE — Patient Instructions (Signed)
[place] [place] at [facility] Discharge Instructions  You were seen today by Dr. Delton Coombes. He went over your recent results. You will be scheduled to have a CT scan of your chest and neck before your next visit. Dr. Delton Coombes will see you back after the scans for follow up.   Thank you for choosing [place] at [facility] to provide your oncology and hematology care.  To afford each patient quality time with our provider, please arrive at least 15 minutes before your scheduled appointment time.   If you have a lab appointment with the [facility] please come in thru the Main Entrance and check in at the main information desk  You need to re-schedule your appointment should you arrive 10 or more minutes late.  We strive to give you quality time with our providers, and arriving late affects you and other patients whose appointments are after yours.  Also, if you no show three or more times for appointments you may be dismissed from the clinic at the providers discretion.     Again, thank you for choosing [name].  Our hope is that these requests will decrease the amount of time that you wait before being seen by our physicians.       _____________________________________________________________  Should you have questions after your visit to [facility], please contact our office at [phone] between the hours of 8:00 a.m. and 4:30 p.m.  Voicemails left after 4:00 p.m. will not be returned until the following business day.  For prescription refill requests, have your pharmacy contact our office and allow 72 hours.    Cancer Center Support Programs:   > Cancer Support Group  2nd Tuesday of the month 1pm-2pm, Journey Room

## 2021-03-19 ENCOUNTER — Ambulatory Visit (HOSPITAL_COMMUNITY): Admission: RE | Admit: 2021-03-19 | Payer: Medicare Other | Source: Ambulatory Visit

## 2021-03-21 ENCOUNTER — Ambulatory Visit: Payer: Medicare Other | Admitting: Family Medicine

## 2021-03-25 ENCOUNTER — Inpatient Hospital Stay (HOSPITAL_COMMUNITY): Payer: Medicare Other | Attending: Hematology | Admitting: Hematology

## 2021-03-25 ENCOUNTER — Encounter: Payer: Self-pay | Admitting: Family Medicine

## 2021-04-02 ENCOUNTER — Other Ambulatory Visit: Payer: Self-pay

## 2021-04-02 ENCOUNTER — Encounter: Payer: Self-pay | Admitting: Family Medicine

## 2021-04-02 ENCOUNTER — Ambulatory Visit (INDEPENDENT_AMBULATORY_CARE_PROVIDER_SITE_OTHER): Payer: Medicare Other | Admitting: Family Medicine

## 2021-04-02 VITALS — BP 84/46 | HR 83 | Ht 68.0 in | Wt 138.0 lb

## 2021-04-02 DIAGNOSIS — C099 Malignant neoplasm of tonsil, unspecified: Secondary | ICD-10-CM | POA: Diagnosis not present

## 2021-04-02 DIAGNOSIS — E039 Hypothyroidism, unspecified: Secondary | ICD-10-CM | POA: Diagnosis not present

## 2021-04-02 DIAGNOSIS — R1310 Dysphagia, unspecified: Secondary | ICD-10-CM

## 2021-04-02 DIAGNOSIS — Z23 Encounter for immunization: Secondary | ICD-10-CM

## 2021-04-02 DIAGNOSIS — Z85819 Personal history of malignant neoplasm of unspecified site of lip, oral cavity, and pharynx: Secondary | ICD-10-CM | POA: Diagnosis not present

## 2021-04-02 NOTE — Progress Notes (Signed)
New Patient Office Visit  Subjective:  Patient ID: [name], male    DOB: Nov 21, 1951  Age: 70 y.o. MRN: [id number]  CC:  Chief Complaint  Patient presents with  . Medical Management of Chronic Issues    New pt. Hx of throat cancer  . Dysphagia    HPI [name] presents to establish care. He has a history of dysphagia following chemo and radiation treatment for tonsillar cancer. He is dependent on a G-tube for feeding and medication. He reports that he is unable to swallow. He last saw his oncologist, Dr. Ellin Saba,  on 03/14/21 following an episode of hemoptysis after a coughing spell on 03/08/21. Dr. Ellin Saba ordered a CT of his neck and chest and requested he follow up with him after these tests. [name] had to cancel the CT and has not called to reschedule the appointment. [name] has seen GI and has had a few attempts at an EGD with the last attempt 1year ago that had to be aborted due to severe anatomy changes and inability to intubate. He also sees Dr. Barnetta Chapel with radiation oncology at [facility] with last visit in December with next follow up in June. Dr. Barnetta Chapel has referred [name] to Dr. Sherryll Burger to discuss a rendezvous procedure. [name] had an appointment with Dr. Sherryll Burger to discuss this on 10/16/20. At the time, [name] declined the procedure as it is not guaranteed to improve his quality of life. Dr. Sherryll Burger requested that [name] follow up in 3-4 months, but [name] has not done so. He has completing PT to help with decreased ROM and strength in his neck and upper extremities and to assist with swallowing. This has been put on home until his CT is completed. [name] also has neuropathy in the left side of his neck and face. He takes gabapentin and lyrica for this. He has a history of hypothyroidism. His last TSH on 01/22/21 was 8.7. He takes synthroid 50 mcg daily. His dosage was not changed with his last lab results. He ultimately would like to eat by mouth again. He is frustrated because he feels like this may not be  possible. He also reports dizziness with head movements and sometimes with standing. Denies chest pain, shortness of breath, palpitations, or edema. He has not had any more episodes of hemoptysis.   Past Medical History:  Diagnosis Date  . HOH (hard of hearing)   . Hyperlipidemia   . Mass of oral cavity 05/12/2018  . Positive FIT (fecal immunochemical test) 05/12/2018  . PTSD (post-traumatic stress disorder)    has not been offically diagnosised  . Tonsillar cancer ([facility]) 06/02/2018    Past Surgical History:  Procedure Laterality Date  . APPENDECTOMY  1969  . COLONOSCOPY W/ POLYPECTOMY     remote past  . COLONOSCOPY WITH PROPOFOL N/A 02/16/2020   Procedure: COLONOSCOPY WITH PROPOFOL;  Surgeon: Corbin Ade, MD;  [facility];  Service: Endoscopy;  Laterality: N/A;  1:45pm  . ESOPHAGOGASTRODUODENOSCOPY  08/04/2018   Dr. Lovell Sheehan: erythema of larynx, normal stomach, normal duodenum. PEG Placed 20 F, bumper at 2.5 cm  . ESOPHAGOGASTRODUODENOSCOPY (EGD) WITH PROPOFOL N/A 08/04/2018   Procedure: ESOPHAGOGASTRODUODENOSCOPY (EGD) WITH PROPOFOL;  Surgeon: Franky Macho, MD;  [facility];  Service: Gastroenterology;  Laterality: N/A;  . ESOPHAGOGASTRODUODENOSCOPY (EGD) WITH PROPOFOL N/A 02/16/2020   Procedure: ESOPHAGOGASTRODUODENOSCOPY (EGD) WITH PROPOFOL;  Surgeon: Corbin Ade, MD;  [facility];  Service: Endoscopy;  Laterality: N/A;  . FINGER FRACTURE  SURGERY Left   . MULTIPLE EXTRACTIONS WITH ALVEOLOPLASTY N/A 06/15/2018   Procedure: Extraction of tooth #'s 2,12,13,18,20,and 21 with alveoloplasty and gross debridement of remaining teeth;  Surgeon: Charlynne Pander, DDS;  Location: [facility] OR;  Service: Oral Surgery;  Laterality: N/A;  . PEG PLACEMENT N/A 08/04/2018   Procedure: PERCUTANEOUS ENDOSCOPIC GASTROSTOMY (PEG) PLACEMENT;  Surgeon: Franky Macho, MD;  [facility];  Service: Gastroenterology;  Laterality: N/A;  . POLYPECTOMY  02/16/2020    Procedure: POLYPECTOMY;  Surgeon: Corbin Ade, MD;  [facility];  Service: Endoscopy;;  . PORTACATH PLACEMENT Left 07/02/2018   Procedure: INSERTION PORT-A-CATH;  Surgeon: Franky Macho, MD;  [facility];  Service: General;  Laterality: Left;    Family History  Problem Relation Age of Onset  . Diabetes Father        died at age 7   . Colon cancer Neg Hx     Social History   Socioeconomic History  . Marital status: Divorced    Spouse name: Not on file  . Number of children: Not on file  . Years of education: Not on file  . Highest education level: Not on file  Occupational History  . Occupation: veteran, retired  Tobacco Use  . Smoking status: Former Smoker    Years: 15.00    Types: Cigarettes    Quit date: 2000    Years since quitting: 22.2  . Smokeless tobacco: Never Used  Vaping Use  . Vaping Use: Never used  Substance and Sexual Activity  . Alcohol use: Not Currently    Alcohol/week: 12.0 standard drinks    Types: 12 Cans of beer per week  . Drug use: Never  . Sexual activity: Not Currently  Other Topics Concern  . 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  He lives independently with his dog here in [place].  He formally lived in [place].   Social Determinants of Health   Financial Resource Strain: Not on file  Food Insecurity: Not on file  Transportation Needs: Not on file  Physical Activity: Not on file  Stress: Not on file  Social Connections: Not on file  Intimate Partner Violence: Not on file    ROS Review of Systems As per HPI.   Objective:   Today's Vitals: BP (!) 84/46   Pulse 83   Ht 5\' 8"  (1.727 m)   Wt 138 lb (62.6 kg)   SpO2 100%   BMI 20.98 kg/m   Physical Exam Vitals and nursing note reviewed.  Constitutional:      General: He is not in acute distress.    Appearance: He is not ill-appearing, toxic-appearing or diaphoretic.  Cardiovascular:      Rate and Rhythm: Normal rate and regular rhythm.     Heart sounds: Normal heart sounds. No murmur heard.   Pulmonary:     Effort: Pulmonary effort is normal. No respiratory distress.     Breath sounds: Normal breath sounds.  Abdominal:     Comments: PEG tube  Skin:    General: Skin is warm and dry.  Neurological:     General: No focal deficit present.     Mental Status: He is alert and oriented to person, place, and time.  Psychiatric:        Mood and Affect: Mood normal.        Behavior: Behavior normal.  Thought Content: Thought content normal.        Judgment: Judgment normal.     Assessment & Plan:   Ulisses was seen today for medical management of chronic issues and dysphagia.  Diagnoses and all orders for this visit:  Tonsil cancer [facility]) Managed by oncology- Dr. Ellin Saba. Also sees Dr. Barnetta Chapel- radiation oncologist. Patient will call Dr.Katragadda's office regarding CT appointment.   Dysphagia, unspecified type  [place] has been referred to Dr. Martina Sinner with [facility] (otalaryngology) to discusses rendezvous procedure. Decided to hold off on procedure at visit on 10/16/20. Patient has not followed up as requested. He is currently G-tube dependent and is working with PT to attempt to regain mobility. Will remind patient to schedule a follow up appointment with Dr. Sherryll Burger.  -     CBC with Differential/Platelet -     CMP14+EGFR  Hypothyroidism, unspecified type Last TSH level was 8.7. On synthroid 50 mcg. TSH level pending, will adjust dosage as needed.  -     TSH  Need for Tdap vaccination Vaccine today in office.  -     Tdap vaccine greater than or equal to 7yo IM  Need for vaccination against Streptococcus pneumoniae Vaccine today in office.  -     Pneumococcal conjugate vaccine 13-valent    Follow-up: Return in about 6 weeks (around 05/14/2021) for chronic follow up.   The patient indicates understanding of these issues and agrees with the plan.   Gabriel Earing, FNP

## 2021-04-02 NOTE — Patient Instructions (Addendum)
Call [phone] to discuss reschedule appointment for MRI.   Hypothyroidism  Hypothyroidism is when the thyroid gland does not make enough of certain hormones (it is underactive). The thyroid gland is a small gland located in the lower front part of the neck, just in front of the windpipe (trachea). This gland makes hormones that help control how the body uses food for energy (metabolism) as well as how the heart and brain function. These hormones also play a role in keeping your bones strong. When the thyroid is underactive, it produces too little of the hormones thyroxine (T4) and triiodothyronine (T3). What are the causes? This condition may be caused by:  Hashimoto's disease. This is a disease in which the body's disease-fighting system (immune system) attacks the thyroid gland. This is the most common cause.  Viral infections.  Pregnancy.  Certain medicines.  Birth defects.  Past radiation treatments to the head or neck for cancer.  Past treatment with radioactive iodine.  Past exposure to radiation in the environment.  Past surgical removal of part or all of the thyroid.  Problems with a gland in the center of the brain (pituitary gland).  Lack of enough iodine in the diet. What increases the risk? You are more likely to develop this condition if:  You are male.  You have a family history of thyroid conditions.  You use a medicine called lithium.  You take medicines that affect the immune system (immunosuppressants). What are the signs or symptoms? Symptoms of this condition include:  Feeling as though you have no energy (lethargy).  Not being able to tolerate cold.  Weight gain that is not explained by a change in diet or exercise habits.  Lack of appetite.  Dry skin.  Coarse hair.  Menstrual irregularity.  Slowing of thought processes.  Constipation.  Sadness or depression. How is this diagnosed? This condition may be diagnosed based  on:  Your symptoms, your medical history, and a physical exam.  Blood tests. You may also have imaging tests, such as an ultrasound or MRI. How is this treated? This condition is treated with medicine that replaces the thyroid hormones that your body does not make. After you begin treatment, it may take several weeks for symptoms to go away. Follow these instructions at home:  Take over-the-counter and prescription medicines only as told by your health care provider.  If you start taking any new medicines, tell your health care provider.  Keep all follow-up visits as told by your health care provider. This is important. ? As your condition improves, your dosage of thyroid hormone medicine may change. ? You will need to have blood tests regularly so that your health care provider can monitor your condition. Contact a health care provider if:  Your symptoms do not get better with treatment.  You are taking thyroid hormone replacement medicine and you: ? Sweat a lot. ? Have tremors. ? Feel anxious. ? Lose weight rapidly. ? Cannot tolerate heat. ? Have emotional swings. ? Have diarrhea. ? Feel weak. Get help right away if you have:  Chest pain.  An irregular heartbeat.  A rapid heartbeat.  Difficulty breathing. Summary  Hypothyroidism is when the thyroid gland does not make enough of certain hormones (it is underactive).  When the thyroid is underactive, it produces too little of the hormones thyroxine (T4) and triiodothyronine (T3).  The most common cause is Hashimoto's disease, a disease in which the body's disease-fighting system (immune system) attacks the thyroid gland. The  condition can also be caused by viral infections, medicine, pregnancy, or past radiation treatment to the head or neck.  Symptoms may include weight gain, dry skin, constipation, feeling as though you do not have energy, and not being able to tolerate cold.  This condition is treated with medicine  to replace the thyroid hormones that your body does not make. This information is not intended to replace advice given to you by your health care provider. Make sure you discuss any questions you have with your health care provider. Document Revised: 09/07/2020 Document Reviewed: 08/23/2020 Elsevier Patient Education  2021 [employer].

## 2021-04-03 ENCOUNTER — Other Ambulatory Visit: Payer: Self-pay | Admitting: Family Medicine

## 2021-04-03 DIAGNOSIS — C099 Malignant neoplasm of tonsil, unspecified: Secondary | ICD-10-CM

## 2021-04-03 DIAGNOSIS — J387 Other diseases of larynx: Secondary | ICD-10-CM | POA: Diagnosis not present

## 2021-04-03 DIAGNOSIS — R1310 Dysphagia, unspecified: Secondary | ICD-10-CM | POA: Diagnosis not present

## 2021-04-03 LAB — CBC WITH DIFFERENTIAL/PLATELET
Basophils Absolute: 0 10*3/uL (ref 0.0–0.2)
Basos: 0 %
EOS (ABSOLUTE): 0.1 10*3/uL (ref 0.0–0.4)
Eos: 2 %
Hematocrit: 36.7 % — ABNORMAL LOW (ref 37.5–51.0)
Hemoglobin: 12 g/dL — ABNORMAL LOW (ref 13.0–17.7)
Immature Grans (Abs): 0 10*3/uL (ref 0.0–0.1)
Immature Granulocytes: 0 %
Lymphocytes Absolute: 0.5 10*3/uL — ABNORMAL LOW (ref 0.7–3.1)
Lymphs: 19 %
MCH: 31.8 pg (ref 26.6–33.0)
MCHC: 32.7 g/dL (ref 31.5–35.7)
MCV: 97 fL (ref 79–97)
Monocytes Absolute: 0.3 10*3/uL (ref 0.1–0.9)
Monocytes: 13 %
Neutrophils Absolute: 1.7 10*3/uL (ref 1.4–7.0)
Neutrophils: 66 %
Platelets: 173 10*3/uL (ref 150–450)
RBC: 3.77 x10E6/uL — ABNORMAL LOW (ref 4.14–5.80)
RDW: 12.4 % (ref 11.6–15.4)
WBC: 2.7 10*3/uL — ABNORMAL LOW (ref 3.4–10.8)

## 2021-04-03 LAB — CMP14+EGFR
ALT: 9 IU/L (ref 0–44)
AST: 14 IU/L (ref 0–40)
Albumin/Globulin Ratio: 1.3 (ref 1.2–2.2)
Albumin: 3.9 g/dL (ref 3.7–4.7)
Alkaline Phosphatase: 74 IU/L (ref 44–121)
BUN/Creatinine Ratio: 18 (ref 10–24)
BUN: 16 mg/dL (ref 8–27)
Bilirubin Total: 0.6 mg/dL (ref 0.0–1.2)
CO2: 29 mmol/L (ref 20–29)
Calcium: 9.4 mg/dL (ref 8.6–10.2)
Chloride: 97 mmol/L (ref 96–106)
Creatinine, Ser: 0.87 mg/dL (ref 0.76–1.27)
Globulin, Total: 3.1 g/dL (ref 1.5–4.5)
Glucose: 103 mg/dL — ABNORMAL HIGH (ref 65–99)
Potassium: 4.2 mmol/L (ref 3.5–5.2)
Sodium: 140 mmol/L (ref 134–144)
Total Protein: 7 g/dL (ref 6.0–8.5)
eGFR: 92 mL/min/{1.73_m2} (ref >59)

## 2021-04-03 LAB — TSH: TSH: 8.18 u[IU]/mL — ABNORMAL HIGH (ref 0.450–4.500)

## 2021-04-03 MED ORDER — LEVOTHYROXINE SODIUM 75 MCG PO TABS
75.0000 ug | ORAL_TABLET | Freq: Every day | ORAL | 0 refills | Status: DC
Start: 2021-04-03 — End: 2021-05-16

## 2021-04-09 ENCOUNTER — Encounter: Payer: Self-pay | Admitting: *Deleted

## 2021-04-10 ENCOUNTER — Ambulatory Visit: Payer: Medicare Other

## 2021-04-10 ENCOUNTER — Ambulatory Visit (INDEPENDENT_AMBULATORY_CARE_PROVIDER_SITE_OTHER): Payer: Medicare Other

## 2021-04-10 VITALS — Ht 68.0 in | Wt 131.0 lb

## 2021-04-10 DIAGNOSIS — Z0000 Encounter for general adult medical examination without abnormal findings: Secondary | ICD-10-CM | POA: Diagnosis not present

## 2021-04-10 NOTE — Patient Instructions (Signed)
Mr. [name] , Thank you for taking time to come for your Medicare Wellness Visit. I appreciate your ongoing commitment to your health goals. Please review the following plan we discussed and let me know if I can assist you in the future.   Screening recommendations/referrals: Colonoscopy: Done 02/16/2020 - Repeat in 10 years Recommended yearly ophthalmology/optometry visit for glaucoma screening and checkup Recommended yearly dental visit for hygiene and checkup  Vaccinations: Influenza vaccine: Due in Fall 2022 Pneumococcal vaccine: Prevnar-13 done 04/02/21, Pneumovax-23 due in one year Tdap vaccine: Done 04/02/21 - Repeat in 10 years Shingles vaccine: Shingrix discussed. Please contact your pharmacy for coverage information.    Covid-19: Done 03/22/20, 04/17/20, & 09/18/2020 - Due for next booster  Advanced directives: Advance directive discussed with you today. I have provided a copy for you to complete at home and have notarized. Once this is complete please bring a copy in to our office so we can scan it into your chart.  Conditions/risks identified: Nutrition/weight loss - Continue what you are doing and keep a check on your weight - keep follow up appointment with Dr Delton Coombes.  Next appointment: Follow up in one year for your annual wellness visit.   Preventive Care 16 Years and Older, Male  Preventive care refers to lifestyle choices and visits with your health care provider that can promote health and wellness. What does preventive care include?  A yearly physical exam. This is also called an annual well check.  Dental exams once or twice a year.  Routine eye exams. Ask your health care provider how often you should have your eyes checked.  Personal lifestyle choices, including:  Daily care of your teeth and gums.  Regular physical activity.  Eating a healthy diet.  Avoiding tobacco and drug use.  Limiting alcohol use.  Practicing safe sex.  Taking low doses of  aspirin every day.  Taking vitamin and mineral supplements as recommended by your health care provider. What happens during an annual well check? The services and screenings done by your health care provider during your annual well check will depend on your age, overall health, lifestyle risk factors, and family history of disease. Counseling  Your health care provider may ask you questions about your:  Alcohol use.  Tobacco use.  Drug use.  Emotional well-being.  Home and relationship well-being.  Sexual activity.  Eating habits.  History of falls.  Memory and ability to understand (cognition).  Work and work Statistician. Screening  You may have the following tests or measurements:  Height, weight, and BMI.  Blood pressure.  Lipid and cholesterol levels. These may be checked every 5 years, or more frequently if you are over 11 years old.  Skin check.  Lung cancer screening. You may have this screening every year starting at age 59 if you have a 30-pack-year history of smoking and currently smoke or have quit within the past 15 years.  Fecal occult blood test (FOBT) of the stool. You may have this test every year starting at age 22.  Flexible sigmoidoscopy or colonoscopy. You may have a sigmoidoscopy every 5 years or a colonoscopy every 10 years starting at age 81.  Prostate cancer screening. Recommendations will vary depending on your family history and other risks.  Hepatitis C blood test.  Hepatitis B blood test.  Sexually transmitted disease (STD) testing.  Diabetes screening. This is done by checking your blood sugar (glucose) after you have not eaten for a while (fasting). You may have this  done every 1-3 years.  Abdominal aortic aneurysm (AAA) screening. You may need this if you are a current or former smoker.  Osteoporosis. You may be screened starting at age 89 if you are at high risk. Talk with your health care provider about your test results,  treatment options, and if necessary, the need for more tests. Vaccines  Your health care provider may recommend certain vaccines, such as:  Influenza vaccine. This is recommended every year.  Tetanus, diphtheria, and acellular pertussis (Tdap, Td) vaccine. You may need a Td booster every 10 years.  Zoster vaccine. You may need this after age 65.  Pneumococcal 13-valent conjugate (PCV13) vaccine. One dose is recommended after age 53.  Pneumococcal polysaccharide (PPSV23) vaccine. One dose is recommended after age 66. Talk to your health care provider about which screenings and vaccines you need and how often you need them. This information is not intended to replace advice given to you by your health care provider. Make sure you discuss any questions you have with your health care provider. Document Released: 01/04/2016 Document Revised: 08/27/2016 Document Reviewed: 10/09/2015 Elsevier Interactive Patient Education  2017 [place] Prevention in the Home Falls can cause injuries. They can happen to people of all ages. There are many things you can do to make your home safe and to help prevent falls. What can I do on the outside of my home?  Regularly fix the edges of walkways and driveways and fix any cracks.  Remove anything that might make you trip as you walk through a door, such as a raised step or threshold.  Trim any bushes or trees on the path to your home.  Use bright outdoor lighting.  Clear any walking paths of anything that might make someone trip, such as rocks or tools.  Regularly check to see if handrails are loose or broken. Make sure that both sides of any steps have handrails.  Any raised decks and porches should have guardrails on the edges.  Have any leaves, snow, or ice cleared regularly.  Use sand or salt on walking paths during winter.  Clean up any spills in your garage right away. This includes oil or grease spills. What can I do in the  bathroom?  Use night lights.  Install grab bars by the toilet and in the tub and shower. Do not use towel bars as grab bars.  Use non-skid mats or decals in the tub or shower.  If you need to sit down in the shower, use a plastic, non-slip stool.  Keep the floor dry. Clean up any water that spills on the floor as soon as it happens.  Remove soap buildup in the tub or shower regularly.  Attach bath mats securely with double-sided non-slip rug tape.  Do not have throw rugs and other things on the floor that can make you trip. What can I do in the bedroom?  Use night lights.  Make sure that you have a light by your bed that is easy to reach.  Do not use any sheets or blankets that are too big for your bed. They should not hang down onto the floor.  Have a firm chair that has side arms. You can use this for support while you get dressed.  Do not have throw rugs and other things on the floor that can make you trip. What can I do in the kitchen?  Clean up any spills right away.  Avoid walking on wet floors.  Keep items that you use a lot in easy-to-reach places.  If you need to reach something above you, use a strong step stool that has a grab bar.  Keep electrical cords out of the way.  Do not use floor polish or wax that makes floors slippery. If you must use wax, use non-skid floor wax.  Do not have throw rugs and other things on the floor that can make you trip. What can I do with my stairs?  Do not leave any items on the stairs.  Make sure that there are handrails on both sides of the stairs and use them. Fix handrails that are broken or loose. Make sure that handrails are as long as the stairways.  Check any carpeting to make sure that it is firmly attached to the stairs. Fix any carpet that is loose or worn.  Avoid having throw rugs at the top or bottom of the stairs. If you do have throw rugs, attach them to the floor with carpet tape.  Make sure that you have a  light switch at the top of the stairs and the bottom of the stairs. If you do not have them, ask someone to add them for you. What else can I do to help prevent falls?  Wear shoes that:  Do not have high heels.  Have rubber bottoms.  Are comfortable and fit you well.  Are closed at the toe. Do not wear sandals.  If you use a stepladder:  Make sure that it is fully opened. Do not climb a closed stepladder.  Make sure that both sides of the stepladder are locked into place.  Ask someone to hold it for you, if possible.  Clearly mark and make sure that you can see:  Any grab bars or handrails.  First and last steps.  Where the edge of each step is.  Use tools that help you move around (mobility aids) if they are needed. These include:  Canes.  Walkers.  Scooters.  Crutches.  Turn on the lights when you go into a dark area. Replace any light bulbs as soon as they burn out.  Set up your furniture so you have a clear path. Avoid moving your furniture around.  If any of your floors are uneven, fix them.  If there are any pets around you, be aware of where they are.  Review your medicines with your doctor. Some medicines can make you feel dizzy. This can increase your chance of falling. Ask your doctor what other things that you can do to help prevent falls. This information is not intended to replace advice given to you by your health care provider. Make sure you discuss any questions you have with your health care provider. Document Released: 10/04/2009 Document Revised: 05/15/2016 Document Reviewed: 01/12/2015 Elsevier Interactive Patient Education  2017 [employer].

## 2021-04-10 NOTE — Progress Notes (Signed)
Subjective:   [name] is a 70 y.o. male who presents for Medicare Annual/Subsequent preventive examination.  Virtual Visit via Telephone Note  I connected with  [name] on 04/10/21 at  9:45 AM EDT by telephone and verified that I am speaking with the correct person using two identifiers.  Location: Patient: Home Provider: [facility] Persons participating in the virtual visit: patient/Nurse Health Advisor   I discussed the limitations, risks, security and privacy concerns of performing an evaluation and management service by telephone and the availability of in person appointments. The patient expressed understanding and agreed to proceed.  Interactive audio and video telecommunications were attempted between this nurse and patient, however failed, due to patient having technical difficulties OR patient did not have access to video capability.  We continued and completed visit with audio only.  Some vital signs may be absent or patient reported.   Kendrew Paci E Ramie Palladino, LPN   Review of Systems     Cardiac Risk Factors include: advanced age (>34men, >7 women);dyslipidemia     Objective:    Today's Vitals   04/10/21 1035  Weight: 131 lb (59.4 kg)  Height: 5\' 8"  (1.727 m)  PainSc: 5    Body mass index is 19.92 kg/m.  Advanced Directives 04/10/2021 03/14/2021 01/29/2021 03/14/2020 02/14/2020 08/25/2019 06/02/2019  Does Patient Have a Medical Advance Directive? No No No No No No No  Does patient want to make changes to medical advance directive? - - - - No - Patient declined - -  Would patient like information on creating a medical advance directive? Yes (MAU/Ambulatory/Procedural Areas - Information given) No - Patient declined No - Patient declined No - Patient declined - No - Patient declined No - Patient declined    Current Medications (verified) Outpatient Encounter Medications as of 04/10/2021  Medication Sig  . Ensure Plus (ENSURE PLUS) LIQD Give via PEG. 2 bottles in morning.  2 in afternoon. 1 before bed. (Patient taking differently: Place 237 mLs into feeding tube 2 (two) times daily between meals. Pt uses 2 cans in the morning and 2 in the evening via peg tube)  . gabapentin (NEURONTIN) 250 MG/5ML solution Take 250 mg by mouth 2 (two) times daily.  Marland Kitchen ibuprofen (ADVIL,MOTRIN) 200 MG tablet Take 400 mg by mouth every 6 (six) hours as needed for moderate pain.   Marland Kitchen levothyroxine (SYNTHROID) 75 MCG tablet Take 1 tablet (75 mcg total) by mouth daily before breakfast.  . OVER THE COUNTER MEDICATION Naked juice protein  . OVER THE COUNTER MEDICATION Take 1 Bottle by mouth daily. [place] [employer] Protein  . OVER THE COUNTER MEDICATION Take 1 Bottle by mouth daily. Bolthouse protein  . pregabalin (LYRICA) 150 MG capsule Take 1 capsule (150 mg total) by mouth 2 (two) times daily. Feeding tube   No facility-administered encounter medications on file as of 04/10/2021.    Allergies (verified) Patient has no known allergies.   History: Past Medical History:  Diagnosis Date  . HOH (hard of hearing)   . Hyperlipidemia   . Mass of oral cavity 05/12/2018  . Positive FIT (fecal immunochemical test) 05/12/2018  . PTSD (post-traumatic stress disorder)    has not been offically diagnosised  . Tonsillar cancer ([place]) 06/02/2018   Past Surgical History:  Procedure Laterality Date  . APPENDECTOMY  1969  . COLONOSCOPY W/ POLYPECTOMY     remote past  . COLONOSCOPY WITH PROPOFOL N/A 02/16/2020   Procedure: COLONOSCOPY WITH PROPOFOL;  Surgeon: Daneil Dolin,  MD;  [facility];  Service: Endoscopy;  Laterality: N/A;  1:45pm  . ESOPHAGOGASTRODUODENOSCOPY  08/04/2018   Dr. Arnoldo Morale: erythema of larynx, normal stomach, normal duodenum. PEG Placed 20 F, bumper at 2.5 cm  . ESOPHAGOGASTRODUODENOSCOPY (EGD) WITH PROPOFOL N/A 08/04/2018   Procedure: ESOPHAGOGASTRODUODENOSCOPY (EGD) WITH PROPOFOL;  Surgeon: Aviva Signs, MD;  [facility];  Service: Gastroenterology;   Laterality: N/A;  . ESOPHAGOGASTRODUODENOSCOPY (EGD) WITH PROPOFOL N/A 02/16/2020   Procedure: ESOPHAGOGASTRODUODENOSCOPY (EGD) WITH PROPOFOL;  Surgeon: Daneil Dolin, MD;  [facility];  Service: Endoscopy;  Laterality: N/A;  . FINGER FRACTURE SURGERY Left   . MULTIPLE EXTRACTIONS WITH ALVEOLOPLASTY N/A 06/15/2018   Procedure: Extraction of tooth #'s 2,12,13,18,20,and 21 with alveoloplasty and gross debridement of remaining teeth;  Surgeon: Lenn Cal, DDS;  Location: [facility];  Service: Oral Surgery;  Laterality: N/A;  . PEG PLACEMENT N/A 08/04/2018   Procedure: PERCUTANEOUS ENDOSCOPIC GASTROSTOMY (PEG) PLACEMENT;  Surgeon: Aviva Signs, MD;  [facility];  Service: Gastroenterology;  Laterality: N/A;  . POLYPECTOMY  02/16/2020   Procedure: POLYPECTOMY;  Surgeon: Daneil Dolin, MD;  [facility];  Service: Endoscopy;;  . PORTACATH PLACEMENT Left 07/02/2018   Procedure: INSERTION PORT-A-CATH;  Surgeon: Aviva Signs, MD;  [facility];  Service: General;  Laterality: Left;   Family History  Problem Relation Age of Onset  . Diabetes Father        died at age 3   . Colon cancer Neg Hx    Social History   Socioeconomic History  . Marital status: Divorced    Spouse name: Not on file  . Number of children: Not on file  . Years of education: Not on file  . Highest education level: Not on file  Occupational History  . Occupation: veteran, retired  Tobacco Use  . Smoking status: Former Smoker    Years: 15.00    Types: Cigarettes    Quit date: 2000    Years since quitting: 22.3  . Smokeless tobacco: Never Used  Vaping Use  . Vaping Use: Never used  Substance and Sexual Activity  . Alcohol use: Not Currently    Alcohol/week: 12.0 standard drinks    Types: 12 Cans of beer per week  . Drug use: Never  . Sexual activity: Not Currently  Other Topics Concern  . Not on file  Social History Narrative   Mr. Bebout is a pleasant gentleman who is  a retired English as a second language teacher.  He lives independently with his dog here in [place].  He formally lived in [place].   Social Determinants of Health   Financial Resource Strain: Low Risk   . Difficulty of Paying Living Expenses: Not very hard  Food Insecurity: No Food Insecurity  . Worried About Charity fundraiser in the Last Year: Never true  . Ran Out of Food in the Last Year: Never true  Transportation Needs: No Transportation Needs  . Lack of Transportation (Medical): No  . Lack of Transportation (Non-Medical): No  Physical Activity: Insufficiently Active  . Days of Exercise per Week: 7 days  . Minutes of Exercise per Session: 10 min  Stress: No Stress Concern Present  . Feeling of Stress : Not at all  Social Connections: Socially Isolated  . Frequency of Communication with Friends and Family: Once a week  . Frequency of Social Gatherings with Friends and Family: Never  . Attends Religious Services: Never  . Active Member of Clubs  or Organizations: No  . Attends Archivist Meetings: Never  . Marital Status: Divorced    Tobacco Counseling Counseling given: Not Answered   Clinical Intake:  Pre-visit preparation completed: Yes  Pain : 0-10 Pain Score: 5  Pain Type: Chronic pain Pain Location: Neck Pain Descriptors / Indicators: Discomfort,Aching,Constant Pain Onset: More than a month ago Pain Frequency: Constant     BMI - recorded: 19.92 Nutritional Status: BMI of 19-24  Normal Diabetes: No     Diabetic? No  Interpreter Needed?: No  Information entered by :: Evynn Boutelle, LPN   Activities of Daily Living In your present state of health, do you have any difficulty performing the following activities: 04/10/2021  Hearing? Y  Comment Moderate hearing loss - can't afford hearing aids  Vision? N  Difficulty concentrating or making decisions? Y  Walking or climbing stairs? N  Dressing or bathing? N  Doing errands, shopping? N  Preparing Food and eating ? N   Using the Toilet? N  In the past six months, have you accidently leaked urine? N  Do you have problems with loss of bowel control? N  Managing your Medications? N  Managing your Finances? N  Housekeeping or managing your Housekeeping? N  Some recent data might be hidden    Patient Care Team: Gwenlyn Perking, FNP as PCP - General (Family Medicine) Gala Romney Cristopher Estimable, MD as Consulting Physician (Gastroenterology) Derek Jack, MD as Consulting Physician (Hematology and Oncology)  Indicate any recent Medical Services you may have received from other than [facility] providers in the past year (date may be approximate).     Assessment:   This is a routine wellness examination for [place].  Hearing/Vision screen  Hearing Screening   125Hz  250Hz  500Hz  1000Hz  2000Hz  3000Hz  4000Hz  6000Hz  8000Hz   Right ear:           Left ear:           Comments: C/O moderate hearing loss - says he cannot afford hearing aids - he is going to check to see if [facility] will cover  Vision Screening Comments: Wears glasses - Annual visits at [facility] on eye exams.  Dietary issues and exercise activities discussed: Current Exercise Habits: Home exercise routine, Type of exercise: walking, Time (Minutes): 10, Frequency (Times/Week): 7, Weekly Exercise (Minutes/Week): 70, Intensity: Mild, Exercise limited by: None identified  Goals    . Patient Stated     Would like to get better and be more active      Depression Screen PHQ 2/9 Scores 04/10/2021 04/10/2021 04/02/2021 05/12/2018  PHQ - 2 Score 2 0 0 0  PHQ- 9 Score 7 - - -    Fall Risk Fall Risk  04/10/2021 04/02/2021 05/12/2018  Falls in the past year? 0 0 No  Number falls in past yr: 0 - -  Injury with Fall? 0 - -  Risk for fall due to : No Fall Risks - -  Follow up Falls prevention discussed - -    FALL RISK PREVENTION PERTAINING TO THE HOME:  Any stairs in or around the home? Yes  If so, are there any without handrails? No  Home free of loose throw  rugs in walkways, pet beds, electrical cords, etc? Yes  Adequate lighting in your home to reduce risk of falls? Yes   ASSISTIVE DEVICES UTILIZED TO PREVENT FALLS:  Life alert? No  Use of a cane, walker or w/c? No  Grab bars in the bathroom? Yes  Shower chair or bench in shower? No  Elevated toilet seat or a handicapped toilet? No   TIMED UP AND GO:  Was the test performed? No . Telephonic visit  Cognitive Function:     6CIT Screen 04/10/2021  What Year? 0 points  What month? 0 points  What time? 0 points  Count back from 20 0 points  Months in reverse 2 points  Repeat phrase 4 points  Total Score 6    Immunizations Immunization History  Administered Date(s) Administered  . H1N1 12/02/2008  . Influenza Split 10/12/2015  . Influenza, High Dose Seasonal PF 10/14/2019  . PFIZER(Purple Top)SARS-COV-2 Vaccination 03/22/2020, 04/17/2020, 09/18/2020  . Pneumococcal Conjugate-13 04/02/2021  . Tdap 04/02/2021    TDAP status: Up to date  Flu Vaccine status: Due, Education has been provided regarding the importance of this vaccine. Advised may receive this vaccine at local pharmacy or Health Dept. Aware to provide a copy of the vaccination record if obtained from local pharmacy or Health Dept. Verbalized acceptance and understanding.  Pneumococcal vaccine status: Up to date  Covid-19 vaccine status: Completed vaccines  Qualifies for Shingles Vaccine? Yes   Zostavax completed Yes   Shingrix Completed?: No.    Education has been provided regarding the importance of this vaccine. Patient has been advised to call insurance company to determine out of pocket expense if they have not yet received this vaccine. Advised may also receive vaccine at local pharmacy or Health Dept. Verbalized acceptance and understanding.  Screening Tests Health Maintenance  Topic Date Due  . COVID-19 Vaccine (4 - Booster for Pfizer series) 04/18/2021 (Originally 03/18/2021)  . INFLUENZA VACCINE   07/22/2021  . PNA vac Low Risk Adult (2 of 2 - PPSV23) 04/02/2022  . COLONOSCOPY (Pts 45-72yrs Insurance coverage will need to be confirmed)  02/15/2030  . TETANUS/TDAP  04/03/2031  . Hepatitis C Screening  Completed  . HPV VACCINES  Aged Out    Health Maintenance  There are no preventive care reminders to display for this patient.  Colorectal cancer screening: Type of screening: Colonoscopy. Completed 02/16/2020. Repeat every 10 years  Lung Cancer Screening: (Low Dose CT Chest recommended if Age 54-80 years, 30 pack-year currently smoking OR have quit w/in 15years.) does not qualify.   Additional Screening:  Hepatitis C Screening: does qualify; Completed 07/23/2020  Vision Screening: Recommended annual ophthalmology exams for early detection of glaucoma and other disorders of the eye. Is the patient up to date with their annual eye exam?  Yes  Who is the provider or what is the name of the office in which the patient attends annual eye exams? [facility] If pt is not established with a provider, would they like to be referred to a provider to establish care? No .   Dental Screening: Recommended annual dental exams for proper oral hygiene  Community Resource Referral / Chronic Care Management: CRR required this visit?  No   CCM required this visit?  No      Plan:     I have personally reviewed and noted the following in the patient's chart:   . Medical and social history . Use of alcohol, tobacco or illicit drugs  . Current medications and supplements . Functional ability and status . Nutritional status . Physical activity . Advanced directives . List of other physicians . Hospitalizations, surgeries, and ER visits in previous 12 months . Vitals . Screenings to include cognitive, depression, and falls . Referrals and appointments  In addition, I have  reviewed and discussed with patient certain preventive protocols, quality metrics, and best practice recommendations. A  written personalized care plan for preventive services as well as general preventive health recommendations were provided to patient.     Sandrea Hammond, LPN   4/43/1540   Nurse Notes: None

## 2021-04-22 DIAGNOSIS — M2569 Stiffness of other specified joint, not elsewhere classified: Secondary | ICD-10-CM | POA: Diagnosis not present

## 2021-04-22 DIAGNOSIS — I890 Lymphedema, not elsewhere classified: Secondary | ICD-10-CM | POA: Diagnosis not present

## 2021-04-22 DIAGNOSIS — C099 Malignant neoplasm of tonsil, unspecified: Secondary | ICD-10-CM | POA: Diagnosis not present

## 2021-04-24 DIAGNOSIS — I890 Lymphedema, not elsewhere classified: Secondary | ICD-10-CM | POA: Diagnosis not present

## 2021-04-24 DIAGNOSIS — C099 Malignant neoplasm of tonsil, unspecified: Secondary | ICD-10-CM | POA: Diagnosis not present

## 2021-04-24 DIAGNOSIS — M2569 Stiffness of other specified joint, not elsewhere classified: Secondary | ICD-10-CM | POA: Diagnosis not present

## 2021-04-29 DIAGNOSIS — I890 Lymphedema, not elsewhere classified: Secondary | ICD-10-CM | POA: Diagnosis not present

## 2021-04-29 DIAGNOSIS — C099 Malignant neoplasm of tonsil, unspecified: Secondary | ICD-10-CM | POA: Diagnosis not present

## 2021-04-29 DIAGNOSIS — M2569 Stiffness of other specified joint, not elsewhere classified: Secondary | ICD-10-CM | POA: Diagnosis not present

## 2021-05-01 ENCOUNTER — Other Ambulatory Visit: Payer: Self-pay | Admitting: Family

## 2021-05-06 DIAGNOSIS — Z431 Encounter for attention to gastrostomy: Secondary | ICD-10-CM | POA: Diagnosis not present

## 2021-05-06 DIAGNOSIS — C801 Malignant (primary) neoplasm, unspecified: Secondary | ICD-10-CM | POA: Diagnosis not present

## 2021-05-06 DIAGNOSIS — R633 Feeding difficulties, unspecified: Secondary | ICD-10-CM | POA: Diagnosis not present

## 2021-05-06 DIAGNOSIS — Z434 Encounter for attention to other artificial openings of digestive tract: Secondary | ICD-10-CM | POA: Diagnosis not present

## 2021-05-07 DIAGNOSIS — I890 Lymphedema, not elsewhere classified: Secondary | ICD-10-CM | POA: Diagnosis not present

## 2021-05-07 DIAGNOSIS — M2569 Stiffness of other specified joint, not elsewhere classified: Secondary | ICD-10-CM | POA: Diagnosis not present

## 2021-05-07 DIAGNOSIS — C099 Malignant neoplasm of tonsil, unspecified: Secondary | ICD-10-CM | POA: Diagnosis not present

## 2021-05-15 ENCOUNTER — Ambulatory Visit (INDEPENDENT_AMBULATORY_CARE_PROVIDER_SITE_OTHER): Payer: Medicare Other | Admitting: Family Medicine

## 2021-05-15 ENCOUNTER — Other Ambulatory Visit: Payer: Self-pay

## 2021-05-15 ENCOUNTER — Encounter: Payer: Self-pay | Admitting: Family Medicine

## 2021-05-15 VITALS — BP 96/46 | HR 86 | Temp 98.4°F | Ht 68.0 in | Wt 138.0 lb

## 2021-05-15 DIAGNOSIS — R1310 Dysphagia, unspecified: Secondary | ICD-10-CM | POA: Diagnosis not present

## 2021-05-15 DIAGNOSIS — E039 Hypothyroidism, unspecified: Secondary | ICD-10-CM

## 2021-05-15 DIAGNOSIS — C099 Malignant neoplasm of tonsil, unspecified: Secondary | ICD-10-CM | POA: Diagnosis not present

## 2021-05-15 NOTE — Progress Notes (Signed)
Established Patient Office Visit  Subjective:  Patient ID: [name], male    DOB: May 16, 1951  Age: 70 y.o. MRN: [id number]  CC:  Chief Complaint  Patient presents with  . Hypothyroidism    HPI MICHAEL WALRATH presents for follow up of hypothyroidism. He was increased to 75 mcg after his last visit. He is here to have his TSH repeated today. He does report fatigue at baseline.   He has been continuing PT for dysphagia. He will see the radiation oncologist next week.   His BP is a little low today. It was low at his last visit too. He denies dizziness, lightheadedness, shortness of breath, chest pain, or edema. He has been staying well hydrated.   Past Medical History:  Diagnosis Date  . HOH (hard of hearing)   . Hyperlipidemia   . Mass of oral cavity 05/12/2018  . Positive FIT (fecal immunochemical test) 05/12/2018  . PTSD (post-traumatic stress disorder)    has not been offically diagnosised  . Tonsillar cancer ([place]) 06/02/2018    Past Surgical History:  Procedure Laterality Date  . APPENDECTOMY  1969  . COLONOSCOPY W/ POLYPECTOMY     remote past  . COLONOSCOPY WITH PROPOFOL N/A 02/16/2020   Procedure: COLONOSCOPY WITH PROPOFOL;  Surgeon: Daneil Dolin, MD;  [facility];  Service: Endoscopy;  Laterality: N/A;  1:45pm  . ESOPHAGOGASTRODUODENOSCOPY  08/04/2018   Dr. Arnoldo Morale: erythema of larynx, normal stomach, normal duodenum. PEG Placed 20 F, bumper at 2.5 cm  . ESOPHAGOGASTRODUODENOSCOPY (EGD) WITH PROPOFOL N/A 08/04/2018   Procedure: ESOPHAGOGASTRODUODENOSCOPY (EGD) WITH PROPOFOL;  Surgeon: Aviva Signs, MD;  [facility];  Service: Gastroenterology;  Laterality: N/A;  . ESOPHAGOGASTRODUODENOSCOPY (EGD) WITH PROPOFOL N/A 02/16/2020   Procedure: ESOPHAGOGASTRODUODENOSCOPY (EGD) WITH PROPOFOL;  Surgeon: Daneil Dolin, MD;  [facility];  Service: Endoscopy;  Laterality: N/A;  . FINGER FRACTURE SURGERY Left   . MULTIPLE EXTRACTIONS WITH  ALVEOLOPLASTY N/A 06/15/2018   Procedure: Extraction of tooth #'s 2,12,13,18,20,and 21 with alveoloplasty and gross debridement of remaining teeth;  Surgeon: Lenn Cal, DDS;  Location: [facility];  Service: Oral Surgery;  Laterality: N/A;  . PEG PLACEMENT N/A 08/04/2018   Procedure: PERCUTANEOUS ENDOSCOPIC GASTROSTOMY (PEG) PLACEMENT;  Surgeon: Aviva Signs, MD;  [facility];  Service: Gastroenterology;  Laterality: N/A;  . POLYPECTOMY  02/16/2020   Procedure: POLYPECTOMY;  Surgeon: Daneil Dolin, MD;  [facility];  Service: Endoscopy;;  . PORTACATH PLACEMENT Left 07/02/2018   Procedure: INSERTION PORT-A-CATH;  Surgeon: Aviva Signs, MD;  [facility];  Service: General;  Laterality: Left;    Family History  Problem Relation Age of Onset  . Diabetes Father        died at age 40   . Colon cancer Neg Hx     Social History   Socioeconomic History  . Marital status: Divorced    Spouse name: Not on file  . Number of children: Not on file  . Years of education: Not on file  . Highest education level: Not on file  Occupational History  . Occupation: veteran, retired  Tobacco Use  . Smoking status: Former Smoker    Years: 15.00    Types: Cigarettes    Quit date: 2000    Years since quitting: 22.4  . Smokeless tobacco: Never Used  Vaping Use  . Vaping Use: Never used  Substance and Sexual Activity  . Alcohol use: Not Currently  Established Patient Office Visit  Subjective:  Patient ID: [name], male    DOB: May 16, 1951  Age: 70 y.o. MRN: [id number]  CC:  Chief Complaint  Patient presents with  . Hypothyroidism    HPI MICHAEL WALRATH presents for follow up of hypothyroidism. He was increased to 75 mcg after his last visit. He is here to have his TSH repeated today. He does report fatigue at baseline.   He has been continuing PT for dysphagia. He will see the radiation oncologist next week.   His BP is a little low today. It was low at his last visit too. He denies dizziness, lightheadedness, shortness of breath, chest pain, or edema. He has been staying well hydrated.   Past Medical History:  Diagnosis Date  . HOH (hard of hearing)   . Hyperlipidemia   . Mass of oral cavity 05/12/2018  . Positive FIT (fecal immunochemical test) 05/12/2018  . PTSD (post-traumatic stress disorder)    has not been offically diagnosised  . Tonsillar cancer ([place]) 06/02/2018    Past Surgical History:  Procedure Laterality Date  . APPENDECTOMY  1969  . COLONOSCOPY W/ POLYPECTOMY     remote past  . COLONOSCOPY WITH PROPOFOL N/A 02/16/2020   Procedure: COLONOSCOPY WITH PROPOFOL;  Surgeon: Daneil Dolin, MD;  [facility];  Service: Endoscopy;  Laterality: N/A;  1:45pm  . ESOPHAGOGASTRODUODENOSCOPY  08/04/2018   Dr. Arnoldo Morale: erythema of larynx, normal stomach, normal duodenum. PEG Placed 20 F, bumper at 2.5 cm  . ESOPHAGOGASTRODUODENOSCOPY (EGD) WITH PROPOFOL N/A 08/04/2018   Procedure: ESOPHAGOGASTRODUODENOSCOPY (EGD) WITH PROPOFOL;  Surgeon: Aviva Signs, MD;  [facility];  Service: Gastroenterology;  Laterality: N/A;  . ESOPHAGOGASTRODUODENOSCOPY (EGD) WITH PROPOFOL N/A 02/16/2020   Procedure: ESOPHAGOGASTRODUODENOSCOPY (EGD) WITH PROPOFOL;  Surgeon: Daneil Dolin, MD;  [facility];  Service: Endoscopy;  Laterality: N/A;  . FINGER FRACTURE SURGERY Left   . MULTIPLE EXTRACTIONS WITH  ALVEOLOPLASTY N/A 06/15/2018   Procedure: Extraction of tooth #'s 2,12,13,18,20,and 21 with alveoloplasty and gross debridement of remaining teeth;  Surgeon: Lenn Cal, DDS;  Location: [facility];  Service: Oral Surgery;  Laterality: N/A;  . PEG PLACEMENT N/A 08/04/2018   Procedure: PERCUTANEOUS ENDOSCOPIC GASTROSTOMY (PEG) PLACEMENT;  Surgeon: Aviva Signs, MD;  [facility];  Service: Gastroenterology;  Laterality: N/A;  . POLYPECTOMY  02/16/2020   Procedure: POLYPECTOMY;  Surgeon: Daneil Dolin, MD;  [facility];  Service: Endoscopy;;  . PORTACATH PLACEMENT Left 07/02/2018   Procedure: INSERTION PORT-A-CATH;  Surgeon: Aviva Signs, MD;  [facility];  Service: General;  Laterality: Left;    Family History  Problem Relation Age of Onset  . Diabetes Father        died at age 40   . Colon cancer Neg Hx     Social History   Socioeconomic History  . Marital status: Divorced    Spouse name: Not on file  . Number of children: Not on file  . Years of education: Not on file  . Highest education level: Not on file  Occupational History  . Occupation: veteran, retired  Tobacco Use  . Smoking status: Former Smoker    Years: 15.00    Types: Cigarettes    Quit date: 2000    Years since quitting: 22.4  . Smokeless tobacco: Never Used  Vaping Use  . Vaping Use: Never used  Substance and Sexual Activity  . Alcohol use: Not Currently  Left lower leg: No edema.  Skin:    General: Skin is warm and dry.  Neurological:     Mental Status: He is alert and oriented to person, place, and time. Mental status is at baseline.  Psychiatric:        Mood and Affect: Mood normal.        Behavior: Behavior normal.     BP (!) 96/46   Pulse 86   Temp 98.4 F (36.9 C) (Temporal)   Ht 5\' 8"  (1.727 m)   Wt 138 lb (62.6 kg)   BMI 20.98 kg/m  Wt Readings from Last 3 Encounters:  05/15/21 138 lb (62.6 kg)  04/10/21 131 lb (59.4 kg)  04/02/21 138 lb (62.6 kg)     Health Maintenance Due  Topic Date Due  . COVID-19 Vaccine (4 - Booster for Pfizer series) 12/18/2020    There are no preventive care reminders to display for this patient.  Lab Results  Component Value Date   TSH 8.180 (H) 04/02/2021   Lab Results  Component Value Date   WBC 2.7 (L) 04/02/2021   HGB 12.0 (L) 04/02/2021   HCT 36.7 (L) 04/02/2021   MCV 97 04/02/2021   PLT 173 04/02/2021   Lab Results  Component Value Date   NA 140 04/02/2021   K 4.2 04/02/2021   CO2 29 04/02/2021   GLUCOSE 103 (H) 04/02/2021   BUN 16 04/02/2021   CREATININE 0.87 04/02/2021   BILITOT 0.6 04/02/2021   ALKPHOS 74 04/02/2021   AST 14 04/02/2021   ALT 9 04/02/2021   PROT 7.0 04/02/2021   ALBUMIN 3.9  04/02/2021   CALCIUM 9.4 04/02/2021   ANIONGAP 7 01/22/2021   EGFR 92 04/02/2021   Lab Results  Component Value Date   CHOL 182 05/12/2018   Lab Results  Component Value Date   HDL 35 (L) 05/12/2018   Lab Results  Component Value Date   LDLCALC 127 (H) 05/12/2018   Lab Results  Component Value Date   TRIG 102 05/12/2018   Lab Results  Component Value Date   CHOLHDL 5.2 (H) 05/12/2018   No results found for: HGBA1C    Assessment & Plan:   Ty[place] was seen today for hypothyroidism.  Diagnoses and all orders for this visit:  Hypothyroidism, unspecified type Last TSH level was 8. Repeat TSH level today. On synthroid 75 mcg, will adjust as needed pending TSH level.  -     TSH  Tonsillar cancer ([facility]) Managed by oncology. Will see radiation oncology next week.   Dysphagia, unspecified type Completing PT, making small improvements per PT notes.    Follow-up: Return in about 3 months (around 08/15/2021) for chronic follow up.   The patient indicates understanding of these issues and agrees with the plan.    Gabriel Earing, FNP

## 2021-05-15 NOTE — Patient Instructions (Signed)
Hypothyroidism  Hypothyroidism is when the thyroid gland does not make enough of certain hormones (it is underactive). The thyroid gland is a small gland located in the lower front part of the neck, just in front of the windpipe (trachea). This gland makes hormones that help control how the body uses food for energy (metabolism) as well as how the heart and brain function. These hormones also play a role in keeping your bones strong. When the thyroid is underactive, it produces too little of the hormones thyroxine (T4) and triiodothyronine (T3). What are the causes? This condition may be caused by:  Hashimoto's disease. This is a disease in which the body's disease-fighting system (immune system) attacks the thyroid gland. This is the most common cause.  Viral infections.  Pregnancy.  Certain medicines.  Birth defects.  Past radiation treatments to the head or neck for cancer.  Past treatment with radioactive iodine.  Past exposure to radiation in the environment.  Past surgical removal of part or all of the thyroid.  Problems with a gland in the center of the brain (pituitary gland).  Lack of enough iodine in the diet. What increases the risk? You are more likely to develop this condition if:  You are male.  You have a family history of thyroid conditions.  You use a medicine called lithium.  You take medicines that affect the immune system (immunosuppressants). What are the signs or symptoms? Symptoms of this condition include:  Feeling as though you have no energy (lethargy).  Not being able to tolerate cold.  Weight gain that is not explained by a change in diet or exercise habits.  Lack of appetite.  Dry skin.  Coarse hair.  Menstrual irregularity.  Slowing of thought processes.  Constipation.  Sadness or depression. How is this diagnosed? This condition may be diagnosed based on:  Your symptoms, your medical history, and a physical exam.  Blood  tests. You may also have imaging tests, such as an ultrasound or MRI. How is this treated? This condition is treated with medicine that replaces the thyroid hormones that your body does not make. After you begin treatment, it may take several weeks for symptoms to go away. Follow these instructions at home:  Take over-the-counter and prescription medicines only as told by your health care provider.  If you start taking any new medicines, tell your health care provider.  Keep all follow-up visits as told by your health care provider. This is important. ? As your condition improves, your dosage of thyroid hormone medicine may change. ? You will need to have blood tests regularly so that your health care provider can monitor your condition. Contact a health care provider if:  Your symptoms do not get better with treatment.  You are taking thyroid hormone replacement medicine and you: ? Sweat a lot. ? Have tremors. ? Feel anxious. ? Lose weight rapidly. ? Cannot tolerate heat. ? Have emotional swings. ? Have diarrhea. ? Feel weak. Get help right away if you have:  Chest pain.  An irregular heartbeat.  A rapid heartbeat.  Difficulty breathing. Summary  Hypothyroidism is when the thyroid gland does not make enough of certain hormones (it is underactive).  When the thyroid is underactive, it produces too little of the hormones thyroxine (T4) and triiodothyronine (T3).  The most common cause is Hashimoto's disease, a disease in which the body's disease-fighting system (immune system) attacks the thyroid gland. The condition can also be caused by viral infections, medicine, pregnancy, or   past radiation treatment to the head or neck.  Symptoms may include weight gain, dry skin, constipation, feeling as though you do not have energy, and not being able to tolerate cold.  This condition is treated with medicine to replace the thyroid hormones that your body does not make. This  information is not intended to replace advice given to you by your health care provider. Make sure you discuss any questions you have with your health care provider. Document Revised: 09/07/2020 Document Reviewed: 08/23/2020 Elsevier Patient Education  2021 Elsevier Inc.  

## 2021-05-16 ENCOUNTER — Other Ambulatory Visit: Payer: Self-pay | Admitting: Family Medicine

## 2021-05-16 ENCOUNTER — Other Ambulatory Visit: Payer: Self-pay

## 2021-05-16 DIAGNOSIS — C099 Malignant neoplasm of tonsil, unspecified: Secondary | ICD-10-CM

## 2021-05-16 DIAGNOSIS — J387 Other diseases of larynx: Secondary | ICD-10-CM | POA: Diagnosis not present

## 2021-05-16 DIAGNOSIS — E039 Hypothyroidism, unspecified: Secondary | ICD-10-CM

## 2021-05-16 DIAGNOSIS — R1310 Dysphagia, unspecified: Secondary | ICD-10-CM | POA: Diagnosis not present

## 2021-05-16 DIAGNOSIS — R7989 Other specified abnormal findings of blood chemistry: Secondary | ICD-10-CM

## 2021-05-16 LAB — TSH: TSH: 8.56 u[IU]/mL — ABNORMAL HIGH (ref 0.450–4.500)

## 2021-05-16 MED ORDER — LEVOTHYROXINE SODIUM 100 MCG PO TABS
100.0000 ug | ORAL_TABLET | Freq: Every day | ORAL | 3 refills | Status: DC
Start: 2021-05-16 — End: 2021-08-16

## 2021-05-22 DIAGNOSIS — M436 Torticollis: Secondary | ICD-10-CM | POA: Diagnosis not present

## 2021-05-22 DIAGNOSIS — Z931 Gastrostomy status: Secondary | ICD-10-CM | POA: Diagnosis not present

## 2021-05-22 DIAGNOSIS — Z923 Personal history of irradiation: Secondary | ICD-10-CM | POA: Diagnosis not present

## 2021-05-22 DIAGNOSIS — Z9221 Personal history of antineoplastic chemotherapy: Secondary | ICD-10-CM | POA: Diagnosis not present

## 2021-05-22 DIAGNOSIS — L905 Scar conditions and fibrosis of skin: Secondary | ICD-10-CM | POA: Diagnosis not present

## 2021-05-22 DIAGNOSIS — R252 Cramp and spasm: Secondary | ICD-10-CM | POA: Diagnosis not present

## 2021-05-22 DIAGNOSIS — C099 Malignant neoplasm of tonsil, unspecified: Secondary | ICD-10-CM | POA: Diagnosis not present

## 2021-05-22 DIAGNOSIS — K222 Esophageal obstruction: Secondary | ICD-10-CM | POA: Diagnosis not present

## 2021-05-22 DIAGNOSIS — G8928 Other chronic postprocedural pain: Secondary | ICD-10-CM | POA: Diagnosis not present

## 2021-05-22 DIAGNOSIS — K117 Disturbances of salivary secretion: Secondary | ICD-10-CM | POA: Diagnosis not present

## 2021-06-04 ENCOUNTER — Other Ambulatory Visit: Payer: Self-pay | Admitting: Family Medicine

## 2021-06-07 ENCOUNTER — Other Ambulatory Visit: Payer: Self-pay | Admitting: Family Medicine

## 2021-06-07 DIAGNOSIS — H7201 Central perforation of tympanic membrane, right ear: Secondary | ICD-10-CM | POA: Diagnosis not present

## 2021-06-07 DIAGNOSIS — C099 Malignant neoplasm of tonsil, unspecified: Secondary | ICD-10-CM

## 2021-06-07 DIAGNOSIS — H6521 Chronic serous otitis media, right ear: Secondary | ICD-10-CM | POA: Diagnosis not present

## 2021-06-07 DIAGNOSIS — H6121 Impacted cerumen, right ear: Secondary | ICD-10-CM | POA: Diagnosis not present

## 2021-06-07 DIAGNOSIS — Z85819 Personal history of malignant neoplasm of unspecified site of lip, oral cavity, and pharynx: Secondary | ICD-10-CM | POA: Diagnosis not present

## 2021-06-19 DIAGNOSIS — C099 Malignant neoplasm of tonsil, unspecified: Secondary | ICD-10-CM | POA: Diagnosis not present

## 2021-06-19 DIAGNOSIS — M2569 Stiffness of other specified joint, not elsewhere classified: Secondary | ICD-10-CM | POA: Diagnosis not present

## 2021-06-19 DIAGNOSIS — I890 Lymphedema, not elsewhere classified: Secondary | ICD-10-CM | POA: Diagnosis not present

## 2021-06-20 DIAGNOSIS — C099 Malignant neoplasm of tonsil, unspecified: Secondary | ICD-10-CM | POA: Diagnosis not present

## 2021-06-20 DIAGNOSIS — J387 Other diseases of larynx: Secondary | ICD-10-CM | POA: Diagnosis not present

## 2021-06-20 DIAGNOSIS — R1310 Dysphagia, unspecified: Secondary | ICD-10-CM | POA: Diagnosis not present

## 2021-06-26 DIAGNOSIS — I890 Lymphedema, not elsewhere classified: Secondary | ICD-10-CM | POA: Diagnosis not present

## 2021-06-26 DIAGNOSIS — C099 Malignant neoplasm of tonsil, unspecified: Secondary | ICD-10-CM | POA: Diagnosis not present

## 2021-06-26 DIAGNOSIS — M2569 Stiffness of other specified joint, not elsewhere classified: Secondary | ICD-10-CM | POA: Diagnosis not present

## 2021-07-03 DIAGNOSIS — I890 Lymphedema, not elsewhere classified: Secondary | ICD-10-CM | POA: Diagnosis not present

## 2021-07-03 DIAGNOSIS — M2569 Stiffness of other specified joint, not elsewhere classified: Secondary | ICD-10-CM | POA: Diagnosis not present

## 2021-07-03 DIAGNOSIS — C099 Malignant neoplasm of tonsil, unspecified: Secondary | ICD-10-CM | POA: Diagnosis not present

## 2021-07-06 ENCOUNTER — Other Ambulatory Visit: Payer: Self-pay | Admitting: Family Medicine

## 2021-07-23 DIAGNOSIS — J387 Other diseases of larynx: Secondary | ICD-10-CM | POA: Diagnosis not present

## 2021-07-23 DIAGNOSIS — C099 Malignant neoplasm of tonsil, unspecified: Secondary | ICD-10-CM | POA: Diagnosis not present

## 2021-07-23 DIAGNOSIS — R1310 Dysphagia, unspecified: Secondary | ICD-10-CM | POA: Diagnosis not present

## 2021-07-29 ENCOUNTER — Other Ambulatory Visit: Payer: Self-pay

## 2021-07-29 ENCOUNTER — Ambulatory Visit (HOSPITAL_COMMUNITY)
Admission: RE | Admit: 2021-07-29 | Discharge: 2021-07-29 | Disposition: A | Discharge status: Home / Self Care | Payer: Medicare Other | Source: Ambulatory Visit | Attending: Hematology | Admitting: Hematology

## 2021-07-29 ENCOUNTER — Inpatient Hospital Stay (HOSPITAL_COMMUNITY): Payer: Medicare Other | Attending: Hematology

## 2021-07-29 DIAGNOSIS — Z79899 Other long term (current) drug therapy: Secondary | ICD-10-CM | POA: Diagnosis not present

## 2021-07-29 DIAGNOSIS — R911 Solitary pulmonary nodule: Secondary | ICD-10-CM | POA: Diagnosis not present

## 2021-07-29 DIAGNOSIS — D61818 Other pancytopenia: Secondary | ICD-10-CM | POA: Insufficient documentation

## 2021-07-29 DIAGNOSIS — R590 Localized enlarged lymph nodes: Secondary | ICD-10-CM | POA: Diagnosis not present

## 2021-07-29 DIAGNOSIS — R918 Other nonspecific abnormal finding of lung field: Secondary | ICD-10-CM | POA: Insufficient documentation

## 2021-07-29 DIAGNOSIS — C099 Malignant neoplasm of tonsil, unspecified: Secondary | ICD-10-CM | POA: Insufficient documentation

## 2021-07-29 DIAGNOSIS — E785 Hyperlipidemia, unspecified: Secondary | ICD-10-CM | POA: Diagnosis not present

## 2021-07-29 DIAGNOSIS — Z79890 Hormone replacement therapy (postmenopausal): Secondary | ICD-10-CM | POA: Insufficient documentation

## 2021-07-29 DIAGNOSIS — E039 Hypothyroidism, unspecified: Secondary | ICD-10-CM | POA: Insufficient documentation

## 2021-07-29 DIAGNOSIS — I6529 Occlusion and stenosis of unspecified carotid artery: Secondary | ICD-10-CM | POA: Diagnosis not present

## 2021-07-29 DIAGNOSIS — Z87891 Personal history of nicotine dependence: Secondary | ICD-10-CM | POA: Insufficient documentation

## 2021-07-29 DIAGNOSIS — M542 Cervicalgia: Secondary | ICD-10-CM | POA: Insufficient documentation

## 2021-07-29 DIAGNOSIS — Z85819 Personal history of malignant neoplasm of unspecified site of lip, oral cavity, and pharynx: Secondary | ICD-10-CM | POA: Insufficient documentation

## 2021-07-29 DIAGNOSIS — K110 Atrophy of salivary gland: Secondary | ICD-10-CM | POA: Diagnosis not present

## 2021-07-29 LAB — CBC WITH DIFFERENTIAL/PLATELET
Abs Immature Granulocytes: 0 10*3/uL (ref 0.00–0.07)
Basophils Absolute: 0 10*3/uL (ref 0.0–0.1)
Basophils Relative: 1 %
Eosinophils Absolute: 0 10*3/uL (ref 0.0–0.5)
Eosinophils Relative: 2 %
HCT: 35.2 % — ABNORMAL LOW (ref 39.0–52.0)
Hemoglobin: 11.1 g/dL — ABNORMAL LOW (ref 13.0–17.0)
Immature Granulocytes: 0 %
Lymphocytes Relative: 25 %
Lymphs Abs: 0.5 10*3/uL — ABNORMAL LOW (ref 0.7–4.0)
MCH: 32.6 pg (ref 26.0–34.0)
MCHC: 31.5 g/dL (ref 30.0–36.0)
MCV: 103.5 fL — ABNORMAL HIGH (ref 80.0–100.0)
Monocytes Absolute: 0.3 10*3/uL (ref 0.1–1.0)
Monocytes Relative: 15 %
Neutro Abs: 1 10*3/uL — ABNORMAL LOW (ref 1.7–7.7)
Neutrophils Relative %: 57 %
Platelets: 145 10*3/uL — ABNORMAL LOW (ref 150–400)
RBC: 3.4 MIL/uL — ABNORMAL LOW (ref 4.22–5.81)
RDW: 13.6 % (ref 11.5–15.5)
WBC: 1.8 10*3/uL — ABNORMAL LOW (ref 4.0–10.5)
nRBC: 0 % (ref 0.0–0.2)

## 2021-07-29 LAB — COMPREHENSIVE METABOLIC PANEL
ALT: 14 U/L (ref 0–44)
AST: 19 U/L (ref 15–41)
Albumin: 3.7 g/dL (ref 3.5–5.0)
Alkaline Phosphatase: 59 U/L (ref 38–126)
Anion gap: 6 (ref 5–15)
BUN: 20 mg/dL (ref 8–23)
CO2: 33 mmol/L — ABNORMAL HIGH (ref 22–32)
Calcium: 9.3 mg/dL (ref 8.9–10.3)
Chloride: 98 mmol/L (ref 98–111)
Creatinine, Ser: 0.83 mg/dL (ref 0.61–1.24)
GFR, Estimated: >60 mL/min (ref >60)
Glucose, Bld: 115 mg/dL — ABNORMAL HIGH (ref 70–99)
Potassium: 4.5 mmol/L (ref 3.5–5.1)
Sodium: 137 mmol/L (ref 135–145)
Total Bilirubin: 0.7 mg/dL (ref 0.3–1.2)
Total Protein: 6.7 g/dL (ref 6.5–8.1)

## 2021-07-29 LAB — TSH: TSH: 5.384 u[IU]/mL — ABNORMAL HIGH (ref 0.350–4.500)

## 2021-07-29 MED ORDER — IOHEXOL 300 MG/ML  SOLN
75.0000 mL | Freq: Once | INTRAMUSCULAR | Status: AC | PRN
  Administered 2021-07-29: 75 mL via INTRAVENOUS

## 2021-08-04 NOTE — Progress Notes (Signed)
[place], [place] [address]   CLINIC:  Medical Oncology/Hematology  PCP:  [name], [place] / [place] [address] [phone]   REASON FOR VISIT:  Follow-up for left tonsillar cancer  PRIOR THERAPY: Concurrent chemoradiation with carboplatin and 5-FU x 3 cycles from 07/13/2018 to 09/13/2018  NGS Results: not done  CURRENT THERAPY: surveillance  BRIEF ONCOLOGIC HISTORY:  Oncology History  Tonsil cancer ([place])  06/02/2018 Initial Diagnosis   Tonsillar cancer ([place])   07/13/2018 - 09/17/2018 Chemotherapy   The patient had palonosetron (ALOXI) injection 0.25 mg, 0.25 mg, Intravenous,  Once, 3 of 3 cycles Administration: 0.25 mg (07/13/2018), 0.25 mg (07/15/2018), 0.25 mg (08/10/2018), 0.25 mg (09/13/2018) fluorouracil (ADRUCIL) 4,750 mg in sodium chloride 0.9 % 55 mL chemo infusion, 600 mg/m2/day = 4,750 mg (100 % of original dose 600 mg/m2/day), Intravenous, 4D (96 hours ), 3 of 3 cycles Dose modification: 600 mg/m2/day (original dose 600 mg/m2/day, Cycle 1, Reason: Other (see comments), Comment: french protocol head and neck) Administration: 4,750 mg (07/13/2018), 4,750 mg (08/10/2018), 4,750 mg (09/13/2018)   for chemotherapy treatment.       CANCER STAGING: Cancer Staging Tonsil cancer ([place]) Staging form: Pharynx - HPV-Mediated Oropharynx, AJCC 8th Edition - Clinical stage from 06/02/2018: Stage II (cT3, cN2, cM0) - Unsigned   INTERVAL HISTORY:  Mr. [name], a 70 y.o. male, returns for routine follow-up of his left tonsillar cancer. [name] was last seen on 03/14/21.   Today he reports feeling fair. He is getting 5 cans of tube feed daily. He reports he occasionally has a cough productive of blood as well as occasional nosebleeds. He reports severely limited ROM in his neck accompanied by tingling on the left side of his neck and face. He is taking Gabapentin through his feeding tube.    REVIEW OF SYSTEMS:  Review  of Systems  Constitutional:  Positive for appetite change (0%) and fatigue (depleted).  HENT:   Positive for nosebleeds and trouble swallowing.   Respiratory:  Positive for cough (productive of blood).   All other systems reviewed and are negative.  PAST MEDICAL/SURGICAL HISTORY:  Past Medical History:  Diagnosis Date   HOH (hard of hearing)    Hyperlipidemia    Mass of oral cavity 05/12/2018   Positive FIT (fecal immunochemical test) 05/12/2018   PTSD (post-traumatic stress disorder)    has not been offically diagnosised   Tonsillar cancer ([facility]) 06/02/2018   Past Surgical History:  Procedure Laterality Date   APPENDECTOMY  1969   COLONOSCOPY W/ POLYPECTOMY     remote past   COLONOSCOPY WITH PROPOFOL N/A 02/16/2020   Procedure: COLONOSCOPY WITH PROPOFOL;  Surgeon: Daneil Dolin, MD;  [facility];  Service: Endoscopy;  Laterality: N/A;  1:45pm   ESOPHAGOGASTRODUODENOSCOPY  08/04/2018   Dr. Arnoldo Morale: erythema of larynx, normal stomach, normal duodenum. PEG Placed 20 F, bumper at 2.5 cm   ESOPHAGOGASTRODUODENOSCOPY (EGD) WITH PROPOFOL N/A 08/04/2018   Procedure: ESOPHAGOGASTRODUODENOSCOPY (EGD) WITH PROPOFOL;  Surgeon: Aviva Signs, MD;  [facility];  Service: Gastroenterology;  Laterality: N/A;   ESOPHAGOGASTRODUODENOSCOPY (EGD) WITH PROPOFOL N/A 02/16/2020   Procedure: ESOPHAGOGASTRODUODENOSCOPY (EGD) WITH PROPOFOL;  Surgeon: Daneil Dolin, MD;  [facility];  Service: Endoscopy;  Laterality: N/A;   FINGER FRACTURE SURGERY Left    MULTIPLE EXTRACTIONS WITH ALVEOLOPLASTY N/A 06/15/2018   Procedure: Extraction of tooth #'s 2,12,13,18,20,and 21 with alveoloplasty and gross debridement of  remaining teeth;  Surgeon: Lenn Cal, DDS;  Location: [facility];  Service: Oral Surgery;  Laterality: N/A;   PEG PLACEMENT N/A 08/04/2018   Procedure: PERCUTANEOUS ENDOSCOPIC GASTROSTOMY (PEG) PLACEMENT;  Surgeon: Aviva Signs, MD;  [facility];  Service:  Gastroenterology;  Laterality: N/A;   POLYPECTOMY  02/16/2020   Procedure: POLYPECTOMY;  Surgeon: Daneil Dolin, MD;  [facility];  Service: Endoscopy;;   PORTACATH PLACEMENT Left 07/02/2018   Procedure: INSERTION PORT-A-CATH;  Surgeon: Aviva Signs, MD;  Location[facility];  Service: General;  Laterality: Left;    SOCIAL HISTORY:  Social History   Socioeconomic History   Marital status: Divorced    Spouse name: Not on file   Number of children: Not on file   Years of education: Not on file   Highest education level: Not on file  Occupational History   Occupation: veteran, retired  Tobacco Use   Smoking status: Former    Years: 15.00    Types: Cigarettes    Quit date: 2000    Years since quitting: 22.6   Smokeless tobacco: Never  Vaping Use   Vaping Use: Never used  Substance and Sexual Activity   Alcohol use: Not Currently    Alcohol/week: 12.0 standard drinks    Types: 12 Cans of beer per week   Drug use: Never   Sexual activity: Not Currently  Other Topics Concern   Not on file  Social History Narrative   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  He lives independently with his dog here in [place].  He formally lived in [place].   Social Determinants of Health   Financial Resource Strain: Low Risk    Difficulty of Paying Living Expenses: Not very hard  Food Insecurity: No Food Insecurity   Worried About Charity fundraiser in the Last Year: Never true   Ran Out of Food in the Last Year: Never true  Transportation Needs: No Transportation Needs   Lack of Transportation (Medical): No   Lack of Transportation (Non-Medical): No  Physical Activity: Insufficiently Active   Days of Exercise per Week: 7 days   Minutes of Exercise per Session: 10 min  Stress: No Stress Concern Present   Feeling of Stress : Not at all  Social Connections: Socially Isolated   Frequency of Communication with Friends and Family: Once a week   Frequency of  Social Gatherings with Friends and Family: Never   Attends Religious Services: Never   Marine scientist or Organizations: No   Attends Music therapist: Never   Marital Status: Divorced  Human resources officer Violence: Not At Risk   Fear of Current or Ex-Partner: No   Emotionally Abused: No   Physically Abused: No   Sexually Abused: No    FAMILY HISTORY:  Family History  Problem Relation Age of Onset   Diabetes Father        died at age 1    Colon cancer Neg Hx     CURRENT MEDICATIONS:  Current Outpatient Medications  Medication Sig Dispense Refill   Ensure Plus (ENSURE PLUS) LIQD Give via PEG. 2 bottles in morning. 2 in afternoon. 1 before bed. (Patient taking differently: Place 237 mLs into feeding tube 2 (two) times daily between meals. Pt uses 2 cans in the morning and 2 in the evening via peg tube)  0   gabapentin (NEURONTIN) 250 MG/5ML solution TAKE 5 ML BY MOUTH TWICE  DAILY THROUGH FEEDING TUBE 300 mL 0   ibuprofen (ADVIL,MOTRIN) 200 MG tablet Take 400 mg by mouth every 6 (six) hours as needed for moderate pain.      levothyroxine (SYNTHROID) 100 MCG tablet Take 1 tablet (100 mcg total) by mouth daily before breakfast. 30 tablet 3   levothyroxine (SYNTHROID) 75 MCG tablet TAKE 1 TABLET BY MOUTH EVERY DAY BEFORE BREAKFAST 90 tablet 1   OVER THE COUNTER MEDICATION Naked juice protein     OVER THE COUNTER MEDICATION Take 1 Bottle by mouth daily. [employer] Protein     OVER THE COUNTER MEDICATION Take 1 Bottle by mouth daily. [place] protein     pregabalin (LYRICA) 150 MG capsule Take 1 capsule (150 mg total) by mouth 2 (two) times daily. Feeding tube 60 capsule 0   No current facility-administered medications for this visit.    ALLERGIES:  No Known Allergies  PHYSICAL EXAM:  Performance status (ECOG): 1 - Symptomatic but completely ambulatory  Vitals:   08/05/21 1444  BP: (!) 112/54  Pulse: 78  Resp: 18  Temp: 98.2 F (36.8 C)   Wt Readings  from Last 3 Encounters:  08/05/21 135 lb (61.2 kg)  05/15/21 138 lb (62.6 kg)  04/10/21 131 lb (59.4 kg)   Physical Exam Vitals reviewed.  Constitutional:      Appearance: Normal appearance.  HENT:     Mouth/Throat:     Tongue: No lesions.  Cardiovascular:     Rate and Rhythm: Normal rate and regular rhythm.     Pulses: Normal pulses.     Heart sounds: Normal heart sounds.  Pulmonary:     Effort: Pulmonary effort is normal.     Breath sounds: Normal breath sounds.  Neurological:     General: No focal deficit present.     Mental Status: He is alert and oriented to person, place, and time.  Psychiatric:        Mood and Affect: Mood normal.        Behavior: Behavior normal.     LABORATORY DATA:  I have reviewed the labs as listed.  CBC Latest Ref Rng & Units 07/29/2021 04/02/2021 01/22/2021  WBC 4.0 - 10.5 K/uL 1.8(L) 2.7(L) 1.6(L)  Hemoglobin 13.0 - 17.0 g/dL 11.1(L) 12.0(L) 11.7(L)  Hematocrit 39.0 - 52.0 % 35.2(L) 36.7(L) 36.1(L)  Platelets 150 - 400 K/uL 145(L) 173 151   CMP Latest Ref Rng & Units 07/29/2021 04/02/2021 01/22/2021  Glucose 70 - 99 mg/dL 115(H) 103(H) 99  BUN 8 - 23 mg/dL '20 16 21  '$ Creatinine 0.61 - 1.24 mg/dL 0.83 0.87 0.83  Sodium 135 - 145 mmol/L 137 140 134(L)  Potassium 3.5 - 5.1 mmol/L 4.5 4.2 3.7  Chloride 98 - 111 mmol/L 98 97 98  CO2 22 - 32 mmol/L 33(H) 29 29  Calcium 8.9 - 10.3 mg/dL 9.3 9.4 8.9  Total Protein 6.5 - 8.1 g/dL 6.7 7.0 7.0  Total Bilirubin 0.3 - 1.2 mg/dL 0.7 0.6 0.6  Alkaline Phos 38 - 126 U/L 59 74 55  AST 15 - 41 U/L '19 14 19  '$ ALT 0 - 44 U/L '14 9 16    '$ DIAGNOSTIC IMAGING:  I have independently reviewed the scans and discussed with the patient. CT SOFT TISSUE NECK W CONTRAST  Result Date: 07/30/2021 CLINICAL DATA:  Tonsillar cancer in 2019, assess treatment response EXAM: CT NECK WITH CONTRAST TECHNIQUE: Multidetector CT imaging of the neck was performed using the standard protocol following the bolus administration of  intravenous  contrast. CONTRAST:  40m OMNIPAQUE IOHEXOL 300 MG/ML  SOLN COMPARISON:  03/07/2020 FINDINGS: Pharynx and larynx: Exuberant post treatment submucosal low-density and architectural distortion. The degree of tissue and shape of the oropharynx is unchanged from prior. Salivary glands: Stable post treatment changes with atrophy Thyroid: Unremarkable symmetric small gland Lymph nodes: None enlarged or abnormal density. Scarring throughout the bilateral neck with loss of soft tissue planes. Non masslike architectural distortion the bilateral supraclavicular fossa which encompasses the subclavians, unchanged. Unchanged reticulation spanning the prevertebral space and left-sided esophagus at the thoracic inlet with non masslike scar-like appearance. Vascular: Extensive carotid bifurcation atheromatous disease. Chronic bilateral IJ narrowing with collaterals. Limited intracranial: Negative Visualized orbits: Negative Mastoids and visualized paranasal sinuses: Partial right mastoid opacification, usually treatment related in this setting. Skeleton: No acute or destructive change Upper chest: Reported separately IMPRESSION: 1. Stable neck CT.  NIRADS 1. 2. Exuberant post treatment scarring. Electronically Signed   By: JMonte FantasiaM.D.   On: 07/30/2021 07:18   CT Chest W Contrast  Result Date: 07/30/2021 CLINICAL DATA:  History of head neck cancer EXAM: CT CHEST WITH CONTRAST TECHNIQUE: Multidetector CT imaging of the chest was performed during intravenous contrast administration. CONTRAST:  754mOMNIPAQUE IOHEXOL 300 MG/ML  SOLN COMPARISON:  PET-CT dated May 31, 2018 FINDINGS: Cardiovascular: Normal heart size. No pericardial effusion. Three-vessel coronary artery calcifications. Atherosclerotic disease of the thoracic aorta. Occlusion of the right subclavian artery as it passes between the clavicle and first rib. Numerous left-sided venous collaterals, likely due to occlusion of the right subclavian vein.  Mediastinum/Nodes: No pathologically enlarged lymph nodes in the chest. Esophagus and thyroid are unremarkable. Lungs/Pleura: No consolidation, pleural effusion or pneumothorax. Ground-glass nodule of the left lower lobe measuring 6 mm. Upper Abdomen: No acute abnormality. Musculoskeletal: No aggressive appearing osseous lesions. IMPRESSION: No evidence of metastatic disease in the chest. Ground-glass nodule of the left lower lobe measuring 6 mm. Recommend attention on follow-up per clinical protocol. If no routine follow-up is planned, additional follow-up CT could be performed in 6-12 months. Electronically Signed   By: LeYetta GlassmanD   On: 07/30/2021 15:30     ASSESSMENT:  1.  Left tonsil squamous cell carcinoma, stage IVa (T3 N2 M0): -3 cycles of carboplatin and 5-FU with radiation from 07/13/2018 through 09/13/2018. -Posttreatment PET scan on 02/07/2019 showed resolution of the left tonsillar mass and neck adenopathy with no active malignancy. -We reviewed results of CT soft tissue neck from 03/07/2020 which did not show any evidence of residual or recurrent disease.  Posttreatment changes in the oropharynx and hypopharynx. -EGD on 02/16/2020 was aborted secondary to radiation changes of the hypopharynx.  Utilizing the slim scope, edematous appearing arytenoids, cricopharyngeal opening identified, hypopharyngeal opening not apparent.  Unable to find the esophageal inlet.  Procedure was abandoned as unable to intubate esophagus.   2.  Hypothyroidism: -He is on Synthroid 50 mcg daily   3.  Nutrition: -He has PEG tube.  PLAN:  1.  Left tonsil squamous cell carcinoma, stage IVa (T3 N2 M0): - Limited examination of the oral cavity did not show any masses. - Reviewed CT neck soft tissue on 07/29/2020 which did not show any evidence of recurrence. - CT of the chest did not show any evidence of metastatic disease.  Groundglass nodule in the left lower lobe measures 6 mm. - Reviewed labs which showed  normal LFTs. - We will switch his CT neck and CT chest to 1 year.  We  will see him back in 6 months with repeat labs and exam.   2.  Hypothyroidism: - Continue Synthroid 50 mcg daily.  TSH is 5.384.   3.  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 - He is taking in tube feeds 2 in the morning, 1 at noon, 2 in the evening.  He is maintaining his weight.   4.  Pancytopenia: - He developed a leukopenia after completion of chemoradiation therapy.  Nutritional deficiency work-up was negative. - He does not report any recurrent infections.  White count is 1.8 with ANC of 1000. - We will repeat [id number], folic acid at next visit.   5.  Neuropathic pains in the neck and shoulder: - Continue liquid gabapentin which is helping.   Orders placed this encounter:  No orders of the defined types were placed in this encounter.    Derek Jack, MD [place] [phone]   I, Thana Ates, am acting as a scribe for Dr. Derek Jack.  I, Derek Jack MD, have reviewed the above documentation for accuracy and completeness, and I agree with the above.

## 2021-08-05 ENCOUNTER — Other Ambulatory Visit: Payer: Self-pay

## 2021-08-05 ENCOUNTER — Inpatient Hospital Stay (HOSPITAL_BASED_OUTPATIENT_CLINIC_OR_DEPARTMENT_OTHER): Payer: Medicare Other | Admitting: Hematology

## 2021-08-05 ENCOUNTER — Encounter (HOSPITAL_COMMUNITY): Payer: Self-pay | Admitting: Hematology

## 2021-08-05 VITALS — BP 112/54 | HR 78 | Temp 98.2°F | Resp 18 | Wt 135.0 lb

## 2021-08-05 DIAGNOSIS — Z85819 Personal history of malignant neoplasm of unspecified site of lip, oral cavity, and pharynx: Secondary | ICD-10-CM | POA: Diagnosis not present

## 2021-08-05 DIAGNOSIS — E785 Hyperlipidemia, unspecified: Secondary | ICD-10-CM | POA: Diagnosis not present

## 2021-08-05 DIAGNOSIS — D61818 Other pancytopenia: Secondary | ICD-10-CM | POA: Diagnosis not present

## 2021-08-05 DIAGNOSIS — C099 Malignant neoplasm of tonsil, unspecified: Secondary | ICD-10-CM

## 2021-08-05 DIAGNOSIS — Z79899 Other long term (current) drug therapy: Secondary | ICD-10-CM | POA: Diagnosis not present

## 2021-08-05 DIAGNOSIS — Z87891 Personal history of nicotine dependence: Secondary | ICD-10-CM | POA: Diagnosis not present

## 2021-08-05 DIAGNOSIS — R918 Other nonspecific abnormal finding of lung field: Secondary | ICD-10-CM | POA: Diagnosis not present

## 2021-08-05 DIAGNOSIS — M542 Cervicalgia: Secondary | ICD-10-CM | POA: Diagnosis not present

## 2021-08-05 DIAGNOSIS — E039 Hypothyroidism, unspecified: Secondary | ICD-10-CM | POA: Diagnosis not present

## 2021-08-05 NOTE — Patient Instructions (Addendum)
[facility] at [facility] Discharge Instructions  You were seen today by Dr. Katragadda. He went over your recent results. Dr. Katragadda will see you back in 6 months for labs and follow up.   Thank you for choosing [facility] at [facility] to provide your oncology and hematology care.  To afford each patient quality time with our provider, please arrive at least 15 minutes before your scheduled appointment time.   If you have a lab appointment with the Cancer Center please come in thru the Main Entrance and check in at the main information desk  You need to re-schedule your appointment should you arrive 10 or more minutes late.  We strive to give you quality time with our providers, and arriving late affects you and other patients whose appointments are after yours.  Also, if you no show three or more times for appointments you may be dismissed from the clinic at the providers discretion.     Again, thank you for choosing [facility].  Our hope is that these requests will decrease the amount of time that you wait before being seen by our physicians.       _____________________________________________________________  Should you have questions after your visit to [facility], please contact our office at [phone] between the hours of 8:00 a.m. and 4:30 p.m.  Voicemails left after 4:00 p.m. will not be returned until the following business day.  For prescription refill requests, have your pharmacy contact our office and allow 72 hours.    Cancer Center Support Programs:   > Cancer Support Group  2nd Tuesday of the month 1pm-2pm, Journey Room    

## 2021-08-08 ENCOUNTER — Other Ambulatory Visit: Payer: Self-pay | Admitting: Family Medicine

## 2021-08-16 ENCOUNTER — Encounter: Payer: Self-pay | Admitting: Family Medicine

## 2021-08-16 ENCOUNTER — Other Ambulatory Visit: Payer: Self-pay

## 2021-08-16 ENCOUNTER — Other Ambulatory Visit: Payer: Self-pay | Admitting: Family Medicine

## 2021-08-16 ENCOUNTER — Ambulatory Visit (INDEPENDENT_AMBULATORY_CARE_PROVIDER_SITE_OTHER): Payer: Medicare Other | Admitting: Family Medicine

## 2021-08-16 VITALS — BP 86/46 | HR 83 | Temp 97.9°F | Ht 68.0 in | Wt 130.4 lb

## 2021-08-16 DIAGNOSIS — E039 Hypothyroidism, unspecified: Secondary | ICD-10-CM

## 2021-08-16 DIAGNOSIS — I959 Hypotension, unspecified: Secondary | ICD-10-CM | POA: Diagnosis not present

## 2021-08-16 DIAGNOSIS — C099 Malignant neoplasm of tonsil, unspecified: Secondary | ICD-10-CM | POA: Diagnosis not present

## 2021-08-16 DIAGNOSIS — Z681 Body mass index (BMI) 19 or less, adult: Secondary | ICD-10-CM | POA: Diagnosis not present

## 2021-08-16 DIAGNOSIS — Z931 Gastrostomy status: Secondary | ICD-10-CM | POA: Diagnosis not present

## 2021-08-16 MED ORDER — LEVOTHYROXINE SODIUM 112 MCG PO TABS: 112.0000 ug | ORAL_TABLET | Freq: Every day | ORAL | 1 refills | Status: DC

## 2021-08-16 NOTE — Progress Notes (Signed)
Established Patient Office Visit  Subjective:  Patient ID: [name], male    DOB: [birth year]  Age: 70 y.o. MRN: [id number]  CC:  Chief Complaint  Patient presents with   Medical Management of Chronic Issues    HPI [name] presents for chronic follow up.   He saw his oncologist a few weeks ago. Labs were drawn, including his TSH. His TSH was slightly elevated. He has been taking synthroid 100 mcg daily. He continues to feel fatigue.   He is currently doing 4 bottles of Ensure a day. He uses the high protein and high calories options. He also does 2-3 bottles of water a day through his GI tube. Reports weight has been stable at home. He weighs himself multiple times a day.   His blood pressure is low today, but stable for him. He denies increased dizziness, palpitations, chest pain, shortness of breath or edema. He has been trying to increase he activity and increase his hydration.   Past Medical History:  Diagnosis Date   HOH (hard of hearing)    Hyperlipidemia    Mass of oral cavity 05/12/2018   Positive FIT (fecal immunochemical test) 05/12/2018   PTSD (post-traumatic stress disorder)    has not been offically diagnosised   Tonsillar cancer (HCC) 06/02/2018    Past Surgical History:  Procedure Laterality Date   APPENDECTOMY  1969   COLONOSCOPY W/ POLYPECTOMY     remote past   COLONOSCOPY WITH PROPOFOL N/A 02/16/2020   Procedure: COLONOSCOPY WITH PROPOFOL;  Surgeon: Corbin Ade, MD;  [facility]: [facility];  Service: Endoscopy;  Laterality: N/A;  1:45pm   ESOPHAGOGASTRODUODENOSCOPY  08/04/2018   Dr. Lovell Sheehan: erythema of larynx, normal stomach, normal duodenum. PEG Placed 20 F, bumper at 2.5 cm   ESOPHAGOGASTRODUODENOSCOPY (EGD) WITH PROPOFOL N/A 08/04/2018   Procedure: ESOPHAGOGASTRODUODENOSCOPY (EGD) WITH PROPOFOL;  Surgeon: Franky Macho, MD;  [facility]: [facility];  Service: Gastroenterology;  Laterality: N/A;   ESOPHAGOGASTRODUODENOSCOPY (EGD) WITH  PROPOFOL N/A 02/16/2020   Procedure: ESOPHAGOGASTRODUODENOSCOPY (EGD) WITH PROPOFOL;  Surgeon: Corbin Ade, MD;  [facility];  Service: Endoscopy;  Laterality: N/A;   FINGER FRACTURE SURGERY Left    MULTIPLE EXTRACTIONS WITH ALVEOLOPLASTY N/A 06/15/2018   Procedure: Extraction of tooth #'s 2,12,13,18,20,and 21 with alveoloplasty and gross debridement of remaining teeth;  Surgeon: Charlynne Pander, DDS;  Location: [facility];  Service: Oral Surgery;  Laterality: N/A;   PEG PLACEMENT N/A 08/04/2018   Procedure: PERCUTANEOUS ENDOSCOPIC GASTROSTOMY (PEG) PLACEMENT;  Surgeon: Franky Macho, MD;  Location: [facility];  Service: Gastroenterology;  Laterality: N/A;   POLYPECTOMY  02/16/2020   Procedure: POLYPECTOMY;  Surgeon: Corbin Ade, MD;  [facility]: [facility];  Service: Endoscopy;;   PORTACATH PLACEMENT Left 07/02/2018   Procedure: INSERTION PORT-A-CATH;  Surgeon: Franky Macho, MD;  Location: [facility];  Service: General;  Laterality: Left;    Family History  Problem Relation Age of Onset   Diabetes Father        died at age 49    Colon cancer Neg Hx     Social History   Socioeconomic History   Marital status: Divorced    Spouse name: Not on file   Number of children: Not on file   Years of education: Not on file   Highest education level: Not on file  Occupational History   Occupation: veteran, retired  Tobacco Use   Smoking status: Former    Years: 15.00  Types: Cigarettes    Quit date: 2000    Years since quitting: 22.6   Smokeless tobacco: Never  Vaping Use   Vaping Use: Never used  Substance and Sexual Activity   Alcohol use: Not Currently    Alcohol/week: 12.0 standard drinks    Types: 12 Cans of beer per week   Drug use: Never   Sexual activity: Not Currently  Other Topics Concern   Not on file  Social History Narrative   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  He lives independently with his dog here in [place].  He formally  lived in [place].   Social Determinants of Health   Financial Resource Strain: Low Risk    Difficulty of Paying Living Expenses: Not very hard  Food Insecurity: No Food Insecurity   Worried About Programme researcher, broadcasting/film/video in the Last Year: Never true   Ran Out of Food in the Last Year: Never true  Transportation Needs: No Transportation Needs   Lack of Transportation (Medical): No   Lack of Transportation (Non-Medical): No  Physical Activity: Insufficiently Active   Days of Exercise per Week: 7 days   Minutes of Exercise per Session: 10 min  Stress: No Stress Concern Present   Feeling of Stress : Not at all  Social Connections: Socially Isolated   Frequency of Communication with Friends and Family: Once a week   Frequency of Social Gatherings with Friends and Family: Never   Attends Religious Services: Never   Database administrator or Organizations: No   Attends Engineer, structural: Never   Marital Status: Divorced  Catering manager Violence: Not At Risk   Fear of Current or Ex-Partner: No   Emotionally Abused: No   Physically Abused: No   Sexually Abused: No    Outpatient Medications Prior to Visit  Medication Sig Dispense Refill   Ensure Plus (ENSURE PLUS) LIQD Give via PEG. 2 bottles in morning. 2 in afternoon. 1 before bed. (Patient taking differently: Place 237 mLs into feeding tube 2 (two) times daily between meals. Pt uses 2 cans in the morning and 2 in the evening via peg tube)  0   gabapentin (NEURONTIN) 250 MG/5ML solution TAKE 5 ML BY MOUTH TWICE DAILY THROUGH FEEDING TUBE 300 mL 0   ibuprofen (ADVIL,MOTRIN) 200 MG tablet Take 400 mg by mouth every 6 (six) hours as needed for moderate pain.      levothyroxine (SYNTHROID) 100 MCG tablet Take 1 tablet (100 mcg total) by mouth daily before breakfast. 30 tablet 3   OVER THE COUNTER MEDICATION Naked juice protein     OVER THE COUNTER MEDICATION Take 1 Bottle by mouth daily. [place] [employer] Protein     OVER THE  COUNTER MEDICATION Take 1 Bottle by mouth daily. [place] protein     pregabalin (LYRICA) 150 MG capsule Take 1 capsule (150 mg total) by mouth 2 (two) times daily. Feeding tube 60 capsule 0   levothyroxine (SYNTHROID) 75 MCG tablet TAKE 1 TABLET BY MOUTH EVERY DAY BEFORE BREAKFAST 90 tablet 1   No facility-administered medications prior to visit.    No Known Allergies  ROS Review of Systems As per HPI.    Objective:    Physical Exam Vitals and nursing note reviewed.  Constitutional:      General: He is not in acute distress.    Appearance: He is not ill-appearing, toxic-appearing or diaphoretic.  HENT:     Head: Normocephalic and  atraumatic.  Eyes:     Extraocular Movements: Extraocular movements intact.  Cardiovascular:     Rate and Rhythm: Normal rate and regular rhythm.     Heart sounds: Normal heart sounds. No murmur heard. Pulmonary:     Effort: Pulmonary effort is normal. No respiratory distress.     Breath sounds: Normal breath sounds.  Abdominal:     General: There is no distension.     Palpations: Abdomen is soft.     Tenderness: There is no abdominal tenderness. There is no guarding or rebound.  Musculoskeletal:     Right lower leg: No edema.     Left lower leg: No edema.  Skin:    General: Skin is warm and dry.  Neurological:     General: No focal deficit present.     Mental Status: He is alert and oriented to person, place, and time.  Psychiatric:        Mood and Affect: Mood normal.        Behavior: Behavior normal.    BP (!) 86/46   Pulse 83   Temp 97.9 F (36.6 C) (Temporal)   Ht 5\' 8"  (1.727 m)   Wt 130 lb 6 oz (59.1 kg)   BMI 19.82 kg/m  Wt Readings from Last 3 Encounters:  08/16/21 130 lb 6 oz (59.1 kg)  08/05/21 135 lb (61.2 kg)  05/15/21 138 lb (62.6 kg)     Health Maintenance Due  Topic Date Due   Zoster Vaccines- Shingrix (1 of 2) Never done   COVID-19 Vaccine (4 - Booster for Pfizer series) 12/18/2020    There are no  preventive care reminders to display for this patient.  Lab Results  Component Value Date   TSH 5.384 (H) 07/29/2021   Lab Results  Component Value Date   WBC 1.8 (L) 07/29/2021   HGB 11.1 (L) 07/29/2021   HCT 35.2 (L) 07/29/2021   MCV 103.5 (H) 07/29/2021   PLT 145 (L) 07/29/2021   Lab Results  Component Value Date   NA 137 07/29/2021   K 4.5 07/29/2021   CO2 33 (H) 07/29/2021   GLUCOSE 115 (H) 07/29/2021   BUN 20 07/29/2021   CREATININE 0.83 07/29/2021   BILITOT 0.7 07/29/2021   ALKPHOS 59 07/29/2021   AST 19 07/29/2021   ALT 14 07/29/2021   PROT 6.7 07/29/2021   ALBUMIN 3.7 07/29/2021   CALCIUM 9.3 07/29/2021   ANIONGAP 6 07/29/2021   EGFR 92 04/02/2021   Lab Results  Component Value Date   CHOL 182 05/12/2018   Lab Results  Component Value Date   HDL 35 (L) 05/12/2018   Lab Results  Component Value Date   LDLCALC 127 (H) 05/12/2018   Lab Results  Component Value Date   TRIG 102 05/12/2018   Lab Results  Component Value Date   CHOLHDL 5.2 (H) 05/12/2018   No results found for: HGBA1C    Assessment & Plan:   Izaiyah was seen today for medical management of chronic issues.  Diagnoses and all orders for this visit:  Hypothyroidism, unspecified type TSH drawn on 07/29/21 was 5.384. Increase synthroid to 112 mcg daily.  -     levothyroxine (SYNTHROID) 112 MCG tablet; Take 1 tablet (112 mcg total) by mouth daily before breakfast.  Hypotension, unspecified hypotension type Stable for patient, denies increase symptoms. Discussed physical activity, standing slowly, and hydration.   Body mass index (BMI) of 19.0 to 19.9 in adult Reports stable weight at home.  Discussed increasing ensure to 5 bottles a day.  Gastrostomy tube dependent (HCC) Has PEG tube.   Tonsillar cancer [facility]) Managed by oncology.    Follow-up: Return in about 3 months (around 11/16/2021) for chronic follow up.   The patient indicates understanding of these issues and agrees with  the plan.  Gabriel Earing, FNP

## 2021-08-16 NOTE — Patient Instructions (Signed)

## 2021-08-22 ENCOUNTER — Encounter (HOSPITAL_BASED_OUTPATIENT_CLINIC_OR_DEPARTMENT_OTHER): Payer: Medicare Other | Attending: Internal Medicine | Admitting: Internal Medicine

## 2021-09-04 DIAGNOSIS — R1310 Dysphagia, unspecified: Secondary | ICD-10-CM | POA: Diagnosis not present

## 2021-09-04 DIAGNOSIS — C099 Malignant neoplasm of tonsil, unspecified: Secondary | ICD-10-CM | POA: Diagnosis not present

## 2021-09-04 DIAGNOSIS — J387 Other diseases of larynx: Secondary | ICD-10-CM | POA: Diagnosis not present

## 2021-09-11 ENCOUNTER — Other Ambulatory Visit: Payer: Self-pay | Admitting: Family Medicine

## 2021-10-15 ENCOUNTER — Encounter (HOSPITAL_BASED_OUTPATIENT_CLINIC_OR_DEPARTMENT_OTHER): Payer: Medicare Other | Attending: Internal Medicine | Admitting: Internal Medicine

## 2021-10-15 ENCOUNTER — Other Ambulatory Visit: Payer: Self-pay

## 2021-10-15 DIAGNOSIS — Z87891 Personal history of nicotine dependence: Secondary | ICD-10-CM | POA: Diagnosis not present

## 2021-10-15 DIAGNOSIS — I10 Essential (primary) hypertension: Secondary | ICD-10-CM | POA: Insufficient documentation

## 2021-10-15 DIAGNOSIS — Z85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, oral cavity, and pharynx: Secondary | ICD-10-CM | POA: Insufficient documentation

## 2021-10-15 DIAGNOSIS — K117 Disturbances of salivary secretion: Secondary | ICD-10-CM | POA: Insufficient documentation

## 2021-10-15 DIAGNOSIS — R1310 Dysphagia, unspecified: Secondary | ICD-10-CM | POA: Diagnosis not present

## 2021-10-15 DIAGNOSIS — H9190 Unspecified hearing loss, unspecified ear: Secondary | ICD-10-CM | POA: Diagnosis not present

## 2021-10-15 DIAGNOSIS — Z931 Gastrostomy status: Secondary | ICD-10-CM | POA: Diagnosis not present

## 2021-10-15 DIAGNOSIS — L598 Other specified disorders of the skin and subcutaneous tissue related to radiation: Secondary | ICD-10-CM | POA: Diagnosis not present

## 2021-10-15 DIAGNOSIS — R1319 Other dysphagia: Secondary | ICD-10-CM | POA: Insufficient documentation

## 2021-10-15 DIAGNOSIS — E039 Hypothyroidism, unspecified: Secondary | ICD-10-CM | POA: Insufficient documentation

## 2021-10-15 DIAGNOSIS — Z923 Personal history of irradiation: Secondary | ICD-10-CM | POA: Insufficient documentation

## 2021-10-15 DIAGNOSIS — R252 Cramp and spasm: Secondary | ICD-10-CM | POA: Diagnosis not present

## 2021-10-15 DIAGNOSIS — J387 Other diseases of larynx: Secondary | ICD-10-CM | POA: Diagnosis not present

## 2021-10-15 DIAGNOSIS — L905 Scar conditions and fibrosis of skin: Secondary | ICD-10-CM | POA: Insufficient documentation

## 2021-10-15 DIAGNOSIS — C099 Malignant neoplasm of tonsil, unspecified: Secondary | ICD-10-CM | POA: Diagnosis not present

## 2021-10-15 NOTE — Progress Notes (Signed)
[name], ACHA ([id number]) Visit Report for 10/15/2021 Abuse/Suicide Risk Screen Details Patient Name: Date of Service: [place], [place] 10/15/2021 1:15 PM Medical Record Number: [id number] Patient Account Number: [id number] Date of Birth/Sex: Treating RN: 1951/02/26 (70 y.o. [name] Primary Care Kalob Bergen: [place] Rexene Edison, TIFFA [place] Other Clinician: Referring Lamount Bankson: Treating Safwan Tomei/Extender: [name] [place] RGA N, TIFFA [place] Weeks in Treatment: 0 Abuse/Suicide Risk Screen Items Answer ABUSE RISK SCREEN: Has anyone close to you tried to hurt or harm you recentlyo No Do you feel uncomfortable with anyone in your familyo No Has anyone forced you do things that you didnt want to doo No Electronic Signature(s) Signed: 10/15/2021 4:59:36 PM By: Antonieta Iba Entered By: Antonieta Iba on 10/15/2021 13:55:39 -------------------------------------------------------------------------------- Activities of Daily Living Details Patient Name: Date of Service: [name], [name] 10/15/2021 1:15 PM Medical Record Number: [id number] Patient Account Number: [id number] Date of Birth/Sex: Treating RN: Jun 22, 1951 (70 y.o. [name] Primary Care Kenyada Dosch: [place] Rexene Edison, TIFFA [place] Other Clinician: Referring Ikeem Cleckler: Treating [name] Flett/Extender: [name] [place] RGA N, TIFFA [place] Weeks in Treatment: 0 Activities of Daily Living Items Answer Activities of Daily Living (Please select one for each item) Drive Automobile Completely Able T Medications ake Completely Able Use T elephone Completely Able Care for Appearance Completely Able Use T oilet Completely Able Bath / Shower Completely Able Dress Self Completely Able Feed Self Completely Able Walk Completely Able Get In / Out Bed Completely Able Housework Completely Able Prepare Meals Completely Able Handle Money Completely Able Shop for Self Completely Able Electronic Signature(s) Signed: 10/15/2021 4:59:36 PM By: Antonieta Iba Entered By: Antonieta Iba on 10/15/2021 13:56:05 -------------------------------------------------------------------------------- Education Screening Details Patient Name: Date of Service: [name], [name] 10/15/2021 1:15 PM Medical Record Number: [id number] Patient Account Number: [id number] Date of Birth/Sex: Treating RN: [birth year] (70 y.o. [name] Primary Care Achsah Mcquade: [place] Rexene Edison, TIFFA [place] Other Clinician: Referring Shiheem Corporan: Treating Audriella Blakeley/Extender: [name] [place] RGA Dorris Carnes, TIFFA [place] Weeks in Treatment: 0 Primary Learner Assessed: Patient Learning Preferences/Education Level/Primary Language Learning Preference: Explanation, Demonstration, Printed Material Highest Education Level: High School Preferred Language: English Cognitive Barrier Language Barrier: No Translator Needed: No Memory Deficit: No Emotional Barrier: No Cultural/Religious Beliefs Affecting Medical Care: No Physical Barrier Impaired Vision: Yes Glasses Impaired Hearing: Yes Hard of Hearing Knowledge/Comprehension Knowledge Level: High Comprehension Level: High Ability to understand written instructions: High Ability to understand verbal instructions: High Motivation Anxiety Level: Calm Cooperation: Cooperative Education Importance: Acknowledges Need Interest in Health Problems: Asks Questions Perception: Coherent Willingness to Engage in Self-Management High Activities: Readiness to Engage in Self-Management High Activities: Electronic Signature(s) Signed: 10/15/2021 4:59:36 PM By: Antonieta Iba Entered By: Antonieta Iba on 10/15/2021 13:56:47 -------------------------------------------------------------------------------- Fall Risk Assessment Details Patient Name: Date of Service: [name] 10/15/2021 1:15 PM Medical Record Number: [id number] Patient Account Number: [id number] Date of Birth/Sex: Treating RN: 06-30-1951 (70 y.o. [name] Primary Care  Donnald Tabar: [place] Rexene Edison, TIFFA [place] Other Clinician: Referring Holmes Hays: Treating Allura Doepke/Extender: [name] [place] RGA Dorris Carnes, TIFFA [place] Weeks in Treatment: 0 Fall Risk Assessment Items Have you had 2 or more falls in the last 12 monthso 0 No Have you had any fall that resulted in injury in the last 12 monthso 0 No FALLS RISK SCREEN History of falling - immediate or within 3 months 0 No Secondary diagnosis (Do you have 2 or more medical diagnoseso) 15 Yes Ambulatory aid None/bed rest/wheelchair/nurse 0 Yes Crutches/cane/walker 0 No Furniture 0  No Intravenous therapy Access/Saline/Heparin Lock 0 No Gait/Transferring Normal/ bed rest/ wheelchair 0 Yes Weak (short steps with or without shuffle, stooped but able to lift head while walking, may seek 0 No support from furniture) Impaired (short steps with shuffle, may have difficulty arising from chair, head down, impaired 0 No balance) Mental Status Oriented to own ability 0 Yes Electronic Signature(s) Signed: 10/15/2021 4:59:36 PM By: Antonieta Iba Entered By: Antonieta Iba on 10/15/2021 13:57:17 -------------------------------------------------------------------------------- Foot Assessment Details Patient Name: Date of Service: [name], [name] 10/15/2021 1:15 PM Medical Record Number: [id number] Patient Account Number: [id number] Date of Birth/Sex: Treating RN: Jan 22, 1951 (70 y.o. [name] Primary Care Lizandra Zakrzewski: [place] Rexene Edison, TIFFA [place] Other Clinician: Referring Charlina Dwight: Treating Evangelia Whitaker/Extender: [name] [place] RGA N, TIFFA [place] Weeks in Treatment: 0 Foot Assessment Items Site Locations + = Sensation present, - = Sensation absent, C = Callus, U = Ulcer R = Redness, W = Warmth, M = Maceration, PU = Pre-ulcerative lesion F = Fissure, S = Swelling, D = Dryness Assessment Right: Left: Other Deformity: No No Prior Foot Ulcer: No No Prior Amputation: No No Charcot Joint: No No Ambulatory Status: Gait: Electronic  Signature(s) Signed: 10/15/2021 4:59:36 PM By: Antonieta Iba Entered By: Antonieta Iba on 10/15/2021 13:58:09 -------------------------------------------------------------------------------- Nutrition Risk Screening Details Patient Name: Date of Service: [name], CIANI 10/15/2021 1:15 PM Medical Record Number: [id number] Patient Account Number: [id number] Date of Birth/Sex: Treating RN: March 08, 1951 (70 y.o. [name] Primary Care Endi Lagman: [place] Rexene Edison, TIFFA [place] Other Clinician: Referring Sariah Henkin: Treating Arriona Prest/Extender: [name] [place] RGA N, TIFFA [place] Weeks in Treatment: 0 Height (in): 68 Weight (lbs): 127 Body Mass Index (BMI): 19.3 Nutrition Risk Screening Items Score Screening NUTRITION RISK SCREEN: I have an illness or condition that made me change the kind and/or amount of food I eat 2 Yes I eat fewer than two meals per day 0 No I eat few fruits and vegetables, or milk products 0 No I have three or more drinks of beer, liquor or wine almost every day 0 No I have tooth or mouth problems that make it hard for me to eat 0 No I don't always have enough money to buy the food I need 0 No I eat alone most of the time 0 No I take three or more different prescribed or over-the-counter drugs a day 1 Yes Without wanting to, I have lost or gained 10 pounds in the last six months 0 No I am not always physically able to shop, cook and/or feed myself 0 No Nutrition Protocols Good Risk Protocol Moderate Risk Protocol 0 Provide education on nutrition High Risk Proctocol Risk Level: Moderate Risk Score: 3 Notes Due to damage from throat cancer all nutrition is through G-Tube Electronic Signature(s) Signed: 10/15/2021 4:59:36 PM By: Antonieta Iba Entered By: Antonieta Iba on 10/15/2021 13:57:59

## 2021-11-08 ENCOUNTER — Other Ambulatory Visit: Payer: Self-pay

## 2021-11-08 ENCOUNTER — Ambulatory Visit (HOSPITAL_COMMUNITY)
Admission: RE | Admit: 2021-11-08 | Discharge: 2021-11-08 | Disposition: A | Discharge status: Home / Self Care | Payer: Medicare Other | Source: Ambulatory Visit | Attending: Family Medicine | Admitting: Family Medicine

## 2021-11-08 DIAGNOSIS — Z029 Encounter for administrative examinations, unspecified: Secondary | ICD-10-CM | POA: Insufficient documentation

## 2021-11-19 ENCOUNTER — Encounter (HOSPITAL_BASED_OUTPATIENT_CLINIC_OR_DEPARTMENT_OTHER): Payer: Medicare Other | Admitting: Internal Medicine

## 2021-11-19 ENCOUNTER — Other Ambulatory Visit: Payer: Self-pay

## 2021-11-19 DIAGNOSIS — C490 Malignant neoplasm of connective and soft tissue of head, face and neck: Secondary | ICD-10-CM | POA: Insufficient documentation

## 2021-11-19 DIAGNOSIS — L598 Other specified disorders of the skin and subcutaneous tissue related to radiation: Secondary | ICD-10-CM | POA: Diagnosis not present

## 2021-11-19 LAB — GLUCOSE, CAPILLARY: Glucose-Capillary: 115 mg/dL — ABNORMAL HIGH (ref 70–99)

## 2021-11-19 NOTE — Progress Notes (Addendum)
MAKII, ARCIDIACONO ([id number]) Visit Report for 11/19/2021 HBO Details Patient Name: Date of Service: [place], [name]. 11/19/2021 1:00 PM Medical Record Number: [id number] Patient Account Number: [id number] Date of Birth/Sex: Treating RN: 03/01/1951 (70 y.o. 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 HBO Treatment Details Treatment Number: 1 Patient Type: Outpatient Chamber Type: Monoplace Chamber Serial #: [id number] Treatment Protocol: 2.0 ATA with 90 minutes oxygen, and no air breaks Treatment Details Compression Rate Down: 1.0 psi / minute De-Compression Rate Up: 1.0 psi / minute Air breaks and breathing Decompress Decompress Compress Tx Pressure Begins Reached periods Begins Ends (leave unused spaces blank) Chamber Pressure (ATA 1 2 ------2 1 ) Clock Time (24 hr) 14:11 14:23 - - - - - - 15:53 16:08 Treatment Length: 117 (minutes) Treatment Segments: 4 Vital Signs Capillary Blood Glucose Reference Range: 80 - 120 mg / dl HBO Diabetic Blood Glucose Intervention Range: <131 mg/dl or >259 mg/dl Time Vitals Blood Respiratory Capillary Blood Glucose Pulse Action Type: Pulse: Temperature: Taken: Pressure: Rate: Glucose (mg/dl): Meter #: Oximetry (%) Taken: Pre 12:54 119/65 85 18 98.9 115 Post 16:16 130/75 86 18 98.3 Aurielle Slingerland Notes No concerns with treatment given. First treatment respiratory and cardiac exams both clear serum and obscuring the tympanic membrane on the left. On the right he has a pre-existing myringotomy tube which he said was placed 2 years ago some old blood around this no active  bleeding Physician HBO Attestation: I certify that I supervised this HBO treatment in accordance with Medicare guidelines. A trained emergency response team is readily available per Yes hospital policies and procedures. Continue HBOT as ordered. Yes Electronic Signature(s) Signed: 11/20/2021 4:53:46 PM By: [name] MD Previous Signature: 11/19/2021 5:03:19 PM Version By: Haywood Pao CHT EMT BS , , Entered By: [name] on 11/19/2021 17:04:09 -------------------------------------------------------------------------------- HBO Safety Checklist Details Patient Name: Date of Service: [place], [name] 11/19/2021 1:00 PM Medical Record Number: [id number] Patient Account Number: [id number] Date of Birth/Sex: Treating RN: 24-Mar-1951 (70 y.o. [name] Primary Care Usha Slager: [name] Other Clinician: Haywood Pao Referring Josecarlos Harriott: Treating Almas Rake/Extender: Standley Brooking, [place] Planas in Treatment: 5 HBO Safety Checklist Items Safety Checklist Consent Form Signed Patient voided / foley secured and emptied When did you last eato 1000 Last dose of injectable or oral agent n/a Ostomy pouch emptied and vented if applicable NA All implantable devices assessed, documented and approved NA Intravenous access site secured and place NA Valuables secured Linens and cotton and cotton/polyester blend (less than 51% polyester) Personal oil-based products / skin lotions / body lotions removed Wigs or hairpieces removed NA Smoking or tobacco materials removed NA Books / newspapers / magazines / loose paper removed Cologne, aftershave, perfume and deodorant removed Jewelry removed (may wrap wedding band) Make-up removed NA Hair care products removed Battery operated devices (external) removed Heating patches and chemical warmers removed NA Titanium eyewear removed Eyewear metal, will check with manufacturer Nail polish cured greater than  10 hours NA Casting material cured greater than 10 hours NA Hearing aids removed NA Loose dentures or partials removed NA Prosthetics have been removed NA Patient demonstrates correct use of air break device (if applicable) Patient concerns have been addressed Patient grounding bracelet on  and cord attached to chamber Specifics for Inpatients (complete in addition to above) Medication sheet sent with patient NA Intravenous medications needed or due during therapy sent with patient NA Drainage tubes (e.g. nasogastric tube or chest tube secured and vented) NA Endotracheal or Tracheotomy tube secured NA Cuff deflated of air and inflated with saline NA Airway suctioned NA Notes Paper version used prior to treatment. Electronic Signature(s) Signed: 11/19/2021 4:02:37 PM By: Haywood Pao CHT EMT BS , , Entered By: Haywood Pao on 11/19/2021 16:02:36

## 2021-11-19 NOTE — Progress Notes (Signed)
FALCON, MCCASKEY ([id number]) Visit Report for 11/19/2021 Arrival Information Details Patient Name: Date of Service: [place], [name] 11/19/2021 1:00 PM Medical Record Number: [id number] Patient Account Number: [id number] Date of Birth/Sex: Treating RN: Jul 20, 1951 (70 y.o. [name] Primary Care Wilmoth Rasnic: [name] Other Clinician: [name] Batman Referring Lathen Seal: Treating Kailand Seda/Extender: Tacey Ruiz in Treatment: 5 Visit Information History Since Last Visit All ordered tests and consults were completed: Yes Patient Arrived: Ambulatory Added or deleted any medications: No Arrival Time: 15:55 Any new allergies or adverse reactions: No Accompanied By: self Had a fall or experienced change in No Transfer Assistance: None activities of daily living that may affect Patient Identification Verified: Yes risk of falls: Secondary Verification Process Completed: Yes Signs or symptoms of abuse/neglect since last visito No Patient Requires Transmission-Based Precautions: No Hospitalized since last visit: No Patient Has Alerts: No Implantable device outside of the clinic excluding No cellular tissue based products placed in the center since last visit: Pain Present Now: No Electronic Signature(s) Signed: 11/19/2021 3:59:21 PM By: [name] CHT EMT BS , , Entered By: [name] on 11/19/2021 15:59:20 -------------------------------------------------------------------------------- [place] Details Patient Name: Date of Service: [name], [name] 11/19/2021 1:00 PM Medical Record Number: [id number] Patient Account Number: [id number] Date of Birth/Sex: Treating RN: 23-Mar-1951 (70 y.o. [name] Primary Care Datrell Dunton: [name] Other Clinician: [name] Batman Referring Dina Warbington: Treating Toini Failla/Extender: [name], [name] in Treatment: 5 Vital Signs Time Taken: 12:54 Temperature (F):  98.9 Height (in): 68 Pulse (bpm): 85 Weight (lbs): 127 Respiratory Rate (breaths/min): 18 Body Mass Index (BMI): 19.3 Blood Pressure (mmHg): 119/65 Capillary Blood Glucose (mg/dl): 115 Reference Range: 80 - 120 mg / dl Electronic Signature(s) Signed: 11/19/2021 4:00:20 PM By: [name] CHT EMT BS , , Entered By: [name] on 11/19/2021 16:00:20

## 2021-11-20 ENCOUNTER — Encounter (HOSPITAL_BASED_OUTPATIENT_CLINIC_OR_DEPARTMENT_OTHER): Payer: Medicare Other | Attending: Physician Assistant | Admitting: Physician Assistant

## 2021-11-20 DIAGNOSIS — L598 Other specified disorders of the skin and subcutaneous tissue related to radiation: Secondary | ICD-10-CM | POA: Diagnosis not present

## 2021-11-20 NOTE — Progress Notes (Signed)
[name], [name] ([id number]) Visit Report for 11/20/2021 SuperBill Details Patient Name: Date of Service: [place], [name] 11/20/2021 Medical Record Number: [id number] Patient Account Number: [id number] Date of Birth/Sex: Treating RN: [birth year] (70 y.o. 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, face and neck Facility Procedures The patient participates with Medicare or their insurance follows the Medicare Facility Guidelines CPT4 Code Description Modifier Quantity 12244975 G0277-(Facility Use Only) HBOT full body chamber, 53min , 4 ICD-10 Diagnosis Description L59.8 Other specified disorders of the skin and subcutaneous tissue related to radiation C49.0 Malignant neoplasm of connective and soft tissue of head, face and neck Physician Procedures Quantity CPT4 Code Description Modifier [id number] [id number] - WC PHYS HYPERBARIC OXYGEN THERAPY 1 ICD-10 Diagnosis Description L59.8 Other specified disorders of the skin and subcutaneous tissue related to radiation C49.0 Malignant neoplasm of connective and soft tissue of head, face and neck Electronic Signature(s) Signed: 11/20/2021 3:21:48 PM By: [name] CHT EMT BS , , Signed: 11/20/2021 6:11:30 PM By: Worthy Keeler PA-C Entered By: [name] on 11/20/2021 15:21:48

## 2021-11-20 NOTE — Progress Notes (Addendum)
[name], [name] ([id number]) Visit Report for 11/20/2021 Arrival Information Details Patient Name: Date of Service: [place], [name] 11/20/2021 1:00 PM Medical Record Number: [id number] Patient Account Number: [id number] Date of Birth/Sex: Treating RN: [birth year] (70 y.o. [name], Tammi Klippel Primary Care Oryan Winterton: [name] Other Clinician: [name] Referring Neera Teng: Treating Katherinne Mofield/Extender: Alinda Dooms, Tiffany Weeks in Treatment: 5 Visit Information History Since Last Visit All ordered tests and consults were completed: Yes Patient Arrived: Ambulatory Added or deleted any medications: No Arrival Time: 13:00 Any new allergies or adverse reactions: No Accompanied By: self Had a fall or experienced change in No Transfer Assistance: None activities of daily living that may affect Patient Identification Verified: Yes risk of falls: Secondary Verification Process Completed: Yes Signs or symptoms of abuse/neglect since last visito No Patient Requires Transmission-Based Precautions: No Hospitalized since last visit: No Patient Has Alerts: No Implantable device outside of the clinic excluding No cellular tissue based products placed in the center since last visit: Pain Present Now: No Electronic Signature(s) Signed: 11/20/2021 2:50:20 PM By: [name] CHT EMT BS , , Entered By: [name] on 11/20/2021 14:50:20 -------------------------------------------------------------------------------- Encounter Discharge Information Details Patient Name: Date of Service: [name] Lango, [name] 11/20/2021 1:00 PM Medical Record Number: [id number] Patient Account Number: [id number] Date of Birth/Sex: Treating RN: November 03, 1951 (70 y.o. [name] Primary Care Nikola Marone: [name] Other Clinician: [name] Referring Edith Lord: Treating Ricco Dershem/Extender: Alinda Dooms, Tiffany Weeks in Treatment: 5 Encounter Discharge  Information Items Discharge Condition: Stable Ambulatory Status: Ambulatory Discharge Destination: Home Transportation: Private Auto Accompanied By: self Schedule Follow-up Appointment: No Clinical Summary of Care: Electronic Signature(s) Signed: 11/20/2021 3:22:15 PM By: [name] CHT EMT BS , , Entered By: [name] on 11/20/2021 15:22:14 -------------------------------------------------------------------------------- [place] Details Patient Name: Date of Service: [name] Lango, [name] 11/20/2021 1:00 PM Medical Record Number: [id number] Patient Account Number: [id number] Date of Birth/Sex: Treating RN: 1951/03/29 (70 y.o. [name] Primary Care Lulamae Skorupski: [name] Other Clinician: [name] Referring Cleva Camero: Treating Abrielle Finck/Extender: Alinda Dooms, Tiffany Weeks in Treatment: 5 Vital Signs Time Taken: 13:06 Temperature (F): 98.2 Height (in): 68 Pulse (bpm): 82 Weight (lbs): 127 Respiratory Rate (breaths/min): 18 Body Mass Index (BMI): 19.3 Blood Pressure (mmHg): 104/57 Reference Range: 80 - 120 mg / dl Electronic Signature(s) Signed: 11/20/2021 2:53:42 PM By: [name] CHT EMT BS , , Entered By: [name] on 11/20/2021 14:53:41

## 2021-11-20 NOTE — Progress Notes (Addendum)
[name] ([id number]) Visit Report for 11/20/2021 HBO Details Patient Name: Date of Service: [name], [name] 11/20/2021 1:00 PM Medical Record Number: [id number] Patient Account Number: [id number] Date of Birth/Sex: Treating RN: 1951-09-16 (70 y.o. 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 HBO Treatment Details Treatment Number: 2 Patient Type: Outpatient Chamber Type: Monoplace Chamber Serial #: [id number] Treatment Protocol: 2.0 ATA with 90 minutes oxygen, and no air breaks Treatment Details Compression Rate Down: 1.0 psi / minute De-Compression Rate Up: 1.5 psi / minute Air breaks and breathing Decompress Decompress Compress Tx Pressure Begins Reached periods Begins Ends (leave unused spaces blank) Chamber Pressure (ATA 1 2 ------2 1 ) Clock Time (24 hr) 13:15 13:30 - - - - - - 15:00 15:10 Treatment Length: 115 (minutes) Treatment Segments: 4 Vital Signs Capillary Blood Glucose Reference Range: 80 - 120 mg / dl HBO Diabetic Blood Glucose Intervention Range: <131 mg/dl or >086 mg/dl Time Vitals Blood Respiratory Capillary Blood Glucose Pulse Action Type: Pulse: Temperature: Taken: Pressure: Rate: Glucose (mg/dl): Meter #: Oximetry (%) Taken: Pre 13:06 104/57 82 18 98.2 Post 15:14 116/60 68 18 98.1 Treatment Response Treatment Toleration: Well Treatment Completion Status: Treatment Completed without Adverse Event Electronic Signature(s) Signed: 11/20/2021 3:21:29 PM By: [name] CHT EMT BS , , Signed: 11/20/2021 6:11:30 PM By: [name] Kelp PA-C Entered By: [name] on  11/20/2021 15:21:28 -------------------------------------------------------------------------------- HBO Safety Checklist Details Patient Name: Date of Service: [name], [name] 11/20/2021 1:00 PM Medical Record Number: [id number] Patient Account Number: [id number] Date of Birth/Sex: Treating RN: 10-03-1951 (70 y.o. [name], [name] Primary Care [name]: [name] Other Clinician: Haywood Juarez Referring [name]: Treating [name]/Extender: [name], [name] in Treatment: 5 HBO Safety Checklist Items Safety Checklist Consent Form Signed Patient voided / foley secured and emptied When did you last eato 1130 Last dose of injectable or oral agent n/a Ostomy pouch emptied and vented if applicable NA All implantable devices assessed, documented and approved NA Intravenous access site secured and place NA Valuables secured Linens and cotton and cotton/polyester blend (less than 51% polyester) Personal oil-based products / skin lotions / body lotions removed Wigs or hairpieces removed NA Smoking or tobacco materials removed NA Books / newspapers / magazines / loose paper removed Cologne, aftershave, perfume and deodorant removed Jewelry removed (may wrap wedding band) Make-up removed NA Hair care products removed Battery operated devices (external) removed Heating patches and chemical warmers removed NA Titanium eyewear removed Checking eyewear Nail polish cured greater than 10 hours NA Casting material cured greater than 10 hours NA Hearing aids removed NA Loose dentures or partials removed NA Prosthetics have been removed Patient demonstrates correct use of air break device (if applicable) Patient concerns have been addressed Patient grounding bracelet on and cord attached to chamber Specifics for Inpatients (complete in addition to above) Medication sheet sent with patient NA Intravenous medications needed or due during therapy  sent with patient NA Drainage tubes (e.g. nasogastric tube or chest tube secured and vented) NA Endotracheal or Tracheotomy tube secured NA Cuff deflated of air and inflated with saline NA Airway suctioned NA Notes Paper version used prior to treatment. Electronic Signature(s) Signed: 11/20/2021 3:20:08 PM By: [name]  CHT EMT BS , , Entered By: [name] on 11/20/2021 15:20:07

## 2021-11-20 NOTE — Progress Notes (Signed)
VASILIOS, OTTAWAY ([id number]) Visit Report for 11/19/2021 SuperBill Details Patient Name: Date of Service: [place], [name] 11/19/2021 Medical Record Number: [id number] Patient Account Number: [id number] Date of Birth/Sex: Treating RN: [birth year] (70 y.o. [name] Primary Care Provider: [name] Other Clinician: [name] Referring Provider: Treating Provider/Extender: [name], [name] in Treatment: 5 Diagnosis Coding ICD-10 Codes Code Description L59.8 Other specified disorders of the skin and subcutaneous tissue related to radiation C49.0 Malignant neoplasm of connective and soft tissue of head, face and neck Facility Procedures The patient participates with Medicare or their insurance follows the Medicare Facility Guidelines CPT4 Code Description Modifier Quantity [id number] G0277-(Facility Use Only) HBOT full body chamber, 61min , 4 ICD-10 Diagnosis Description L59.8 Other specified disorders of the skin and subcutaneous tissue related to radiation C49.0 Malignant neoplasm of connective and soft tissue of head, face and neck Physician Procedures Quantity CPT4 Code Description Modifier [id number] - WC PHYS HYPERBARIC OXYGEN THERAPY 1 ICD-10 Diagnosis Description L59.8 Other specified disorders of the skin and subcutaneous tissue related to radiation C49.0 Malignant neoplasm of connective and soft tissue of head, face and neck Electronic Signature(s) Signed: 11/19/2021 5:14:48 PM By: [name] CHT EMT BS , , Signed: 11/20/2021 4:53:46 PM By: [name] MD Entered By: [name] on 11/19/2021 17:14:48

## 2021-11-21 ENCOUNTER — Other Ambulatory Visit: Payer: Self-pay

## 2021-11-21 ENCOUNTER — Encounter (HOSPITAL_BASED_OUTPATIENT_CLINIC_OR_DEPARTMENT_OTHER): Payer: Medicare Other | Attending: Internal Medicine | Admitting: Internal Medicine

## 2021-11-21 DIAGNOSIS — L598 Other specified disorders of the skin and subcutaneous tissue related to radiation: Secondary | ICD-10-CM | POA: Insufficient documentation

## 2021-11-21 DIAGNOSIS — R1319 Other dysphagia: Secondary | ICD-10-CM | POA: Insufficient documentation

## 2021-11-21 DIAGNOSIS — Z85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, oral cavity, and pharynx: Secondary | ICD-10-CM | POA: Diagnosis not present

## 2021-11-21 DIAGNOSIS — Z931 Gastrostomy status: Secondary | ICD-10-CM | POA: Diagnosis not present

## 2021-11-21 NOTE — Progress Notes (Addendum)
[name], [name] ([id number]) Visit Report for 11/21/2021 Arrival Information Details Patient Name: Date of Service: [place], [name] 11/21/2021 1:00 PM Medical Record Number: [id number] Patient Account Number: [id number] Date of Birth/Sex: Treating RN: 03/18/1951 (70 y.o. [name], Tammi Klippel Primary Care Aizza Santiago: [name] Other Clinician: [name] Referring Roswell Ndiaye: Treating Gelila Well/Extender: Tacey Ruiz in Treatment: 5 Visit Information History Since Last Visit All ordered tests and consults were completed: Yes Patient Arrived: Ambulatory Added or deleted any medications: No Arrival Time: 13:11 Any new allergies or adverse reactions: No Accompanied By: self Had a fall or experienced change in No Transfer Assistance: None activities of daily living that may affect Patient Identification Verified: Yes risk of falls: Secondary Verification Process Completed: Yes Signs or symptoms of abuse/neglect since last visito No Patient Requires Transmission-Based Precautions: No Hospitalized since last visit: No Patient Has Alerts: No Implantable device outside of the clinic excluding No cellular tissue based products placed in the center since last visit: Pain Present Now: No Electronic Signature(s) Signed: 11/21/2021 2:52:43 PM By: [name] CHT EMT BS , , Entered By: [name] on 11/21/2021 14:52:43 -------------------------------------------------------------------------------- Encounter Discharge Information Details Patient Name: Date of Service: [name], [name] 11/21/2021 1:00 PM Medical Record Number: [id number] Patient Account Number: [id number] Date of Birth/Sex: Treating RN: 12-07-1951 (70 y.o. [name] Primary Care Taiven Greenley: [name] Other Clinician: [name] Referring Treyce Spillers: Treating Safiatou Islam/Extender: Tacey Ruiz in Treatment: 5 Encounter Discharge Information  Items Discharge Condition: Stable Ambulatory Status: Ambulatory Discharge Destination: Home Transportation: Private Auto Accompanied By: self Schedule Follow-up Appointment: No Clinical Summary of Care: Electronic Signature(s) Signed: 11/21/2021 3:50:39 PM By: [name] CHT EMT BS , , Entered By: [name] on 11/21/2021 15:50:39 -------------------------------------------------------------------------------- [place] Details Patient Name: Date of Service: [name], [name] 11/21/2021 1:00 PM Medical Record Number: [id number] Patient Account Number: [id number] Date of Birth/Sex: Treating RN: 05/03/1951 (70 y.o. [name] Primary Care Jyles Sontag: [name] Other Clinician: [name] Referring Cynai Skeens: Treating Shenise Wolgamott/Extender: [name], [name] in Treatment: 5 Vital Signs Time Taken: 13:28 Temperature (F): 98.1 Height (in): 68 Pulse (bpm): 86 Weight (lbs): 127 Respiratory Rate (breaths/min): 18 Body Mass Index (BMI): 19.3 Blood Pressure (mmHg): 111/55 Reference Range: 80 - 120 mg / dl Electronic Signature(s) Signed: 11/21/2021 2:55:02 PM By: [name] CHT EMT BS , , Entered By: [name] on 11/21/2021 14:55:02

## 2021-11-21 NOTE — Progress Notes (Addendum)
KEWON, ROERING ([id number]) Visit Report for 11/21/2021 HBO Details Patient Name: Date of Service: [place], [name]. 11/21/2021 1:00 PM Medical Record Number: [id number] Patient Account Number: [id number] Date of Birth/Sex: Treating RN: Sep 25, 1951 (70 y.o. 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 HBO Treatment Details Treatment Number: 3 Patient Type: Outpatient Chamber Type: Monoplace Chamber Serial #: [id number] Treatment Protocol: 2.0 ATA with 90 minutes oxygen, and no air breaks Treatment Details Compression Rate Down: 1.5 psi / minute De-Compression Rate Up: 1.5 psi / minute Air breaks and breathing Decompress Decompress Compress Tx Pressure Begins Reached periods Begins Ends (leave unused spaces blank) Chamber Pressure (ATA 1 2 ------2 1 ) Clock Time (24 hr) 13:36 13:46 - - - - - - 15:16 15:26 Treatment Length: 110 (minutes) Treatment Segments: 4 Vital Signs Capillary Blood Glucose Reference Range: 80 - 120 mg / dl HBO Diabetic Blood Glucose Intervention Range: <131 mg/dl or >244 mg/dl Time Vitals Blood Respiratory Capillary Blood Glucose Pulse Action Type: Pulse: Temperature: Taken: Pressure: Rate: Glucose (mg/dl): Meter #: Oximetry (%) Taken: Pre 13:28 111/55 86 18 98.1 Post 15:28 109/66 69 18 98.2 Treatment Response Treatment Toleration: Well Treatment Completion Status: Treatment Completed without Adverse Event Corrine Tillis Notes No concerns with treatment given Physician HBO Attestation: I certify that I supervised this HBO treatment in accordance with Medicare guidelines. A trained emergency  response team is readily available per Yes hospital policies and procedures. Continue HBOT as ordered. Yes Electronic Signature(s) Signed: 11/21/2021 4:34:24 PM By: [name] MD Previous Signature: 11/21/2021 3:48:55 PM Version By: Haywood Pao CHT EMT BS , , Entered By: [name] on 11/21/2021 16:31:40 -------------------------------------------------------------------------------- HBO Safety Checklist Details Patient Name: Date of Service: [place], [name] 11/21/2021 1:00 PM Medical Record Number: [id number] Patient Account Number: [id number] Date of Birth/Sex: Treating RN: 28-Feb-1951 (70 y.o. [name], [name] Primary Care Vena Bassinger: [name] Other Clinician: Haywood Pao Referring Cambelle Suchecki: Treating Phillp Dolores/Extender: Standley Brooking, [place] Planas in Treatment: 5 HBO Safety Checklist Items Safety Checklist Consent Form Signed Patient voided / foley secured and emptied When did you last eato 0730 Last dose of injectable or oral agent n/a Ostomy pouch emptied and vented if applicable NA All implantable devices assessed, documented and approved Intravenous access site secured and place NA Valuables secured Linens and cotton and cotton/polyester blend (less than 51% polyester) Personal oil-based products / skin lotions / body lotions removed Wigs or hairpieces removed NA Smoking or tobacco materials removed NA Books / newspapers / magazines / loose paper removed Cologne, aftershave, perfume and deodorant removed Jewelry removed (may wrap wedding band) Make-up removed NA Hair care products removed Battery operated devices (external) removed Heating patches and chemical warmers removed Titanium eyewear removed Eyewear metal, will check with manufacturer Nail polish cured greater than 10 hours NA Casting material cured greater than 10 hours NA Hearing aids removed NA Loose dentures or partials removed NA Prosthetics have been  removed NA Patient demonstrates correct use of air break device (if applicable) Patient concerns have been addressed Patient grounding bracelet on and cord attached to chamber Specifics for Inpatients (complete in addition to above) Medication sheet sent with patient NA Intravenous medications needed or due during therapy sent with patient NA Drainage tubes (  e.g. nasogastric tube or chest tube secured and vented) NA Endotracheal or Tracheotomy tube secured NA Cuff deflated of air and inflated with saline NA Airway suctioned NA Notes Paper version used prior to treatment. Electronic Signature(s) Signed: 11/21/2021 2:56:38 PM By: Haywood Pao CHT EMT BS , , Entered By: Haywood Pao on 11/21/2021 14:56:38

## 2021-11-21 NOTE — Progress Notes (Signed)
[name], [name] ([id number]) Visit Report for 11/21/2021 SuperBill Details Patient Name: Date of Service: [place], [name] 11/21/2021 Medical Record Number: [id number] Patient Account Number: [id number] Date of Birth/Sex: Treating RN: 1951/02/22 (70 y.o. [name] Primary Care Provider: [name] Other Clinician: [name] Referring Provider: Treating Provider/Extender: [name], [name] in Treatment: 5 Diagnosis Coding ICD-10 Codes Code Description L59.8 Other specified disorders of the skin and subcutaneous tissue related to radiation C49.0 Malignant neoplasm of connective and soft tissue of head, face and neck Facility Procedures The patient participates with Medicare or their insurance follows the Medicare Facility Guidelines CPT4 Code Description Modifier Quantity [id number] G0277-(Facility Use Only) HBOT full body chamber, 63min , 4 ICD-10 Diagnosis Description L59.8 Other specified disorders of the skin and subcutaneous tissue related to radiation C49.0 Malignant neoplasm of connective and soft tissue of head, face and neck Physician Procedures Quantity CPT4 Code Description Modifier [id number] 00370 - WC PHYS HYPERBARIC OXYGEN THERAPY 1 ICD-10 Diagnosis Description L59.8 Other specified disorders of the skin and subcutaneous tissue related to radiation C49.0 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

## 2021-11-22 ENCOUNTER — Encounter (HOSPITAL_BASED_OUTPATIENT_CLINIC_OR_DEPARTMENT_OTHER): Payer: Medicare Other | Admitting: Internal Medicine

## 2021-11-22 DIAGNOSIS — C490 Malignant neoplasm of connective and soft tissue of head, face and neck: Secondary | ICD-10-CM

## 2021-11-22 DIAGNOSIS — L598 Other specified disorders of the skin and subcutaneous tissue related to radiation: Secondary | ICD-10-CM

## 2021-11-23 NOTE — Progress Notes (Addendum)
BAILEN, MCCONVILLE ([id number]) Visit Report for 11/22/2021 HBO Details Patient Name: Date of Service: [place], [name]. 11/22/2021 10:00 A M Medical Record Number: [id number] Patient Account Number: [id number] Date of Birth/Sex: Treating RN: 1951-01-03 (70 y.o. 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 HBO Treatment Details Treatment Number: 4 Patient Type: Outpatient Chamber Type: Monoplace Chamber Serial #: [id number] Treatment Protocol: 2.0 ATA with 90 minutes oxygen, and no air breaks Treatment Details Compression Rate Down: 1.5 psi / minute De-Compression Rate Up: 1.5 psi / minute Air breaks and breathing Decompress Decompress Compress Tx Pressure Begins Reached periods Begins Ends (leave unused spaces blank) Chamber Pressure (ATA 1 2 ------2 1 ) Clock Time (24 hr) 10:27 10:37 - - - - - - 12:07 12:18 Treatment Length: 111 (minutes) Treatment Segments: 4 Vital Signs Capillary Blood Glucose Reference Range: 80 - 120 mg / dl HBO Diabetic Blood Glucose Intervention Range: <131 mg/dl or >308 mg/dl Time Vitals Blood Respiratory Capillary Blood Glucose Pulse Action Type: Pulse: Temperature: Taken: Pressure: Rate: Glucose (mg/dl): Meter #: Oximetry (%) Taken: Pre 09:47 119/58 77 16 98 Post 12:21 116/64 67 16 98.4 Treatment Response Treatment Toleration: Well Treatment Completion Status: Treatment Completed without Adverse Event Physician HBO Attestation: I certify that I supervised this HBO treatment in accordance with Medicare guidelines. A trained emergency response team is readily available per  Yes hospital policies and procedures. Continue HBOT as ordered. Yes Electronic Signature(s) Signed: 11/26/2021 9:41:55 AM By: Geralyn [name] DO Previous Signature: 11/22/2021 2:03:38 PM Version By: Haywood Pao CHT EMT BS , , Entered By: Geralyn [name] on 11/26/2021 09:41:07 -------------------------------------------------------------------------------- HBO Safety Checklist Details Patient Name: Date of Service: [place], [name] 11/22/2021 10:00 A M Medical Record Number: [id number] Patient Account Number: [id number] Date of Birth/Sex: Treating RN: 10-Mar-1951 (70 y.o. [name], [name] Primary Care Jalynn Betzold: [name] Other Clinician: Haywood Pao Referring Skye Rodarte: Treating Maleki Hippe/Extender: Cristopher Peru Weeks in Treatment: 5 HBO Safety Checklist Items Safety Checklist Consent Form Signed Patient voided / foley secured and emptied When did you last eato 0730 Last dose of injectable or oral agent n/a Ostomy pouch emptied and vented if applicable NA All implantable devices assessed, documented and approved NA Intravenous access site secured and place NA Valuables secured Linens and cotton and cotton/polyester blend (less than 51% polyester) Personal oil-based products / skin lotions / body lotions removed Wigs or hairpieces removed NA Smoking or tobacco materials removed NA Books / newspapers / magazines / loose paper removed Cologne, aftershave, perfume and deodorant removed Jewelry removed (may wrap wedding band) Make-up removed NA Hair care products removed Battery operated devices (external) removed Heating patches and chemical warmers removed Titanium eyewear removed Checking eyewear Nail polish cured greater than 10 hours NA Casting material cured greater than 10 hours NA Hearing aids removed NA Loose dentures or partials removed NA Prosthetics have been removed NA Patient demonstrates correct use of air break  device (if applicable) Patient concerns have been addressed Patient grounding bracelet on and cord attached to chamber Specifics for Inpatients (complete in addition to above) Medication sheet sent with patient NA Intravenous medications needed or due during therapy sent with patient NA Drainage tubes (e.g. nasogastric tube or chest tube secured and  vented) NA Endotracheal or Tracheotomy tube secured NA Cuff deflated of air and inflated with saline NA Airway suctioned NA Notes Paper version used prior to treatment. Electronic Signature(s) Signed: 11/22/2021 2:02:13 PM By: Haywood Pao CHT EMT BS , , Previous Signature: 11/22/2021 11:59:11 AM Version By: Haywood Pao CHT EMT BS , , Entered By: Haywood Pao on 11/22/2021 14:02:13

## 2021-11-23 NOTE — Progress Notes (Addendum)
[name], [name] ([id number]) Visit Report for 11/22/2021 Arrival Information Details Patient Name: Date of Service: [place], [place]. 11/22/2021 10:00 A M Medical Record Number: [id number] Patient Account Number: [id number] Date of Birth/Sex: Treating RN: [birth year] (70 y.o. [name], Meta.Reding Primary Care Dilyn Osoria: [name] Other Clinician: [name] Referring Prerana Strayer: Treating Punam Broussard/Extender: [name] in Treatment: 5 Visit Information History Since Last Visit All ordered tests and consults were completed: Yes Patient Arrived: Ambulatory Added or deleted any medications: No Arrival Time: 09:46 Any new allergies or adverse reactions: No Accompanied By: self Had a fall or experienced change in No Transfer Assistance: None activities of daily living that may affect Patient Identification Verified: Yes risk of falls: Secondary Verification Process Completed: Yes Signs or symptoms of abuse/neglect since last visito No Patient Requires Transmission-Based Precautions: No Hospitalized since last visit: No Patient Has Alerts: No Implantable device outside of the clinic excluding No cellular tissue based products placed in the center since last visit: Pain Present Now: No Electronic Signature(s) Signed: 11/22/2021 11:46:58 AM By: [name] CHT EMT BS , , Previous Signature: 11/22/2021 11:39:28 AM Version By: [name] CHT EMT BS , , Entered By: [name] on 11/22/2021 11:46:58 -------------------------------------------------------------------------------- Encounter Discharge Information Details Patient Name: Date of Service: [name] [place], [name] 11/22/2021 10:00 A M Medical Record Number: [id number] Patient Account Number: [id number] Date of Birth/Sex: Treating RN: 1951-06-26 (70 y.o. [name] Primary Care Nazareth Kirk: [name] Other Clinician: [name] Referring Makynli Stills: Treating  Livia Tarr/Extender: [name] in Treatment: 5 Encounter Discharge Information Items Discharge Condition: Stable Ambulatory Status: Ambulatory Discharge Destination: Home Transportation: Private Auto Accompanied By: self Schedule Follow-up Appointment: No Clinical Summary of Care: Electronic Signature(s) Signed: 11/22/2021 2:04:54 PM By: [name] CHT EMT BS , , Entered By: [name] on 11/22/2021 14:04:54 -------------------------------------------------------------------------------- Rocky Details Patient Name: Date of Service: [name], [name] 11/22/2021 10:00 A M Medical Record Number: [id number] Patient Account Number: [id number] Date of Birth/Sex: Treating RN: 12-12-1951 (70 y.o. [name] Primary Care Keynan Heffern: [name] Other Clinician: [name] Referring Zerek Litsey: Treating Dillian Feig/Extender: [name] Weeks in Treatment: 5 Vital Signs Time Taken: 09:47 Temperature (F): 98 Height (in): 68 Pulse (bpm): 77 Weight (lbs): 127 Respiratory Rate (breaths/min): 16 Body Mass Index (BMI): 19.3 Blood Pressure (mmHg): 119/58 Reference Range: 80 - 120 mg / dl Electronic Signature(s) Signed: 11/22/2021 11:45:58 AM By: [name] CHT EMT BS , , Entered By: [name] on 11/22/2021 11:45:57

## 2021-11-25 ENCOUNTER — Encounter (HOSPITAL_BASED_OUTPATIENT_CLINIC_OR_DEPARTMENT_OTHER): Payer: Medicare Other | Admitting: Internal Medicine

## 2021-11-26 ENCOUNTER — Encounter (HOSPITAL_BASED_OUTPATIENT_CLINIC_OR_DEPARTMENT_OTHER): Payer: Medicare Other | Admitting: Internal Medicine

## 2021-11-26 ENCOUNTER — Other Ambulatory Visit: Payer: Self-pay

## 2021-11-26 DIAGNOSIS — L598 Other specified disorders of the skin and subcutaneous tissue related to radiation: Secondary | ICD-10-CM | POA: Diagnosis not present

## 2021-11-26 NOTE — Progress Notes (Addendum)
[name], [name] ([id number]) Visit Report for 11/26/2021 HBO Details Patient Name: Date of Service: [name], [name]. 11/26/2021 1:00 PM Medical Record Number: [id number] Patient Account Number: [id number] Date of Birth/Sex: Treating RN: 05-Nov-1951 (70 y.o. 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 HBO Treatment Details Treatment Number: 5 Patient Type: Outpatient Chamber Type: Monoplace Chamber Serial #: [id number] Treatment Protocol: 2.0 ATA with 90 minutes oxygen, and no air breaks Treatment Details Compression Rate Down: 1.0 psi / minute De-Compression Rate Up: 1.5 psi / minute Air breaks and breathing Decompress Decompress Compress Tx Pressure Begins Reached periods Begins Ends (leave unused spaces blank) Chamber Pressure (ATA 1 2 ------2 1 ) Clock Time (24 hr) 13:50 14:05 - - - - - - 15:35 15:45 Treatment Length: 115 (minutes) Treatment Segments: 4 Vital Signs Capillary Blood Glucose Reference Range: 80 - 120 mg / dl HBO Diabetic Blood Glucose Intervention Range: <131 mg/dl or >433 mg/dl Time Vitals Blood Respiratory Capillary Blood Glucose Pulse Action Type: Pulse: Temperature: Taken: Pressure: Rate: Glucose (mg/dl): Meter #: Oximetry (%) Taken: Pre 13:18 101/57 87 16 98.4 100 Post 15:48 112/59 68 16 98.7 Treatment Response Treatment Toleration: Well Treatment Completion Status: Treatment Completed without Adverse Event Alleen Kehm Notes Patient expressed concern he apparently had a choking spell on Saturday eventually able to expel some mucus. This lasted about 10 minutes he said that this is  happened before but not as bad as this episode. We therefore checked him out before he went in the chambers. His vitals were stable pulse ox 100%. Respiratory exam was clear there was no stridor cardiac exam was normal. He tolerated his treatment well Physician HBO Attestation: I certify that I supervised this HBO treatment in accordance with Medicare guidelines. A trained emergency response team is readily available per Yes hospital policies and procedures. Continue HBOT as ordered. Yes Electronic Signature(Juarez) Signed: 11/26/2021 4:21:48 PM By: [name] MD Previous Signature: 11/26/2021 4:04:01 PM Version By: Haywood Pao CHT EMT BS , , Entered By: [name] on 11/26/2021 16:15:23 -------------------------------------------------------------------------------- HBO Safety Checklist Details Patient Name: Date of Service: [name], [name]. 11/26/2021 1:00 PM Medical Record Number: [id number] Patient Account Number: [id number] Date of Birth/Sex: Treating RN: [birth year] (70 y.o. [name] Primary Care Robynn Marcel: [name] Other Clinician: Haywood Pao Referring Kamaron Deskins: Treating Daeron Carreno/Extender: [name] in Treatment: 6 HBO Safety Checklist Items Safety Checklist Consent Form Signed Patient voided / foley secured and emptied When did you last eato Breakfast Last dose of injectable or oral agent n/a Ostomy pouch emptied and vented if applicable NA All implantable devices assessed, documented and approved NA Intravenous access site secured and place NA Valuables secured Linens and cotton and cotton/polyester blend (less than 51% polyester) Personal oil-based products / skin lotions / body lotions removed Wigs or hairpieces removed NA Smoking or tobacco materials removed NA Books / newspapers / magazines / loose paper removed Cologne, aftershave, perfume and deodorant removed Jewelry removed (may wrap wedding  band) Make-up removed NA Hair care products removed Battery operated devices (external) removed Heating patches and chemical warmers removed Titanium eyewear removed Nail polish cured greater than 10 hours NA Casting material cured greater than 10 hours NA Hearing aids removed NA  Loose dentures or partials removed NA Prosthetics have been removed NA Patient demonstrates correct use of air break device (if applicable) Patient concerns have been addressed Patient grounding bracelet on and cord attached to chamber Specifics for Inpatients (complete in addition to above) Medication sheet sent with patient NA Intravenous medications needed or due during therapy sent with patient NA Drainage tubes (e.g. nasogastric tube or chest tube secured and vented) NA Endotracheal or Tracheotomy tube secured NA Cuff deflated of air and inflated with saline NA Airway suctioned NA Electronic Signature(Juarez) Signed: 11/26/2021 4:01:35 PM By: Haywood Pao CHT EMT BS , , Entered By: Haywood Pao on 11/26/2021 16:01:35

## 2021-11-26 NOTE — Progress Notes (Signed)
KAIROS, PANETTA ([id number]) Visit Report for 11/26/2021 SuperBill Details Patient Name: Date of Service: [place], [name] 11/26/2021 Medical Record Number: [id number] Patient Account Number: [id number] Date of Birth/Sex: Treating RN: Nov 18, 1951 (70 y.o. 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, face and neck Facility Procedures The patient participates with Medicare or their insurance follows the Medicare Facility Guidelines CPT4 Code Description Modifier Quantity [id number] G0277-(Facility Use Only) HBOT full body chamber, 64min , 4 ICD-10 Diagnosis Description L59.8 Other specified disorders of the skin and subcutaneous tissue related to radiation 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

## 2021-11-26 NOTE — Progress Notes (Signed)
[name], [name] ([id number]) Visit Report for 11/22/2021 SuperBill Details Patient Name: Date of Service: [place], [name] 11/22/2021 Medical Record Number: [id number] Patient Account Number: [id number] Date of Birth/Sex: Treating RN: 1951-04-19 (70 y.o. 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, face and neck Facility Procedures The patient participates with Medicare or their insurance follows the Medicare Facility Guidelines CPT4 Code Description Modifier Quantity [id number] G0277-(Facility Use Only) HBOT full body chamber, 4min , 4 ICD-10 Diagnosis Description L59.8 Other specified disorders of the skin and subcutaneous tissue related to radiation 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

## 2021-11-26 NOTE — Progress Notes (Signed)
[name], [name] ([id number]) Visit Report for 11/26/2021 Arrival Information Details Patient Name: Date of Service: [place], [name] 11/26/2021 1:00 PM Medical Record Number: [id number] Patient Account Number: [id number] Date of Birth/Sex: Treating RN: 03-Jan-1951 (70 y.o. [name] Primary Care Ambriella Kitt: [name] Other Clinician: [name] Referring Helem Reesor: Treating Cherokee Boccio/Extender: Tacey Ruiz in Treatment: 6 Visit Information History Since Last Visit All ordered tests and consults were completed: Yes Patient Arrived: Ambulatory Added or deleted any medications: No Arrival Time: 13:00 Any new allergies or adverse reactions: No Accompanied By: self Had a fall or experienced change in No Transfer Assistance: None activities of daily living that may affect Patient Identification Verified: Yes risk of falls: Secondary Verification Process Completed: Yes Signs or symptoms of abuse/neglect since last visito No Patient Requires Transmission-Based Precautions: No Hospitalized since last visit: No Patient Has Alerts: No Implantable device outside of the clinic excluding No cellular tissue based products placed in the center since last visit: Pain Present Now: No Electronic Signature(s) Signed: 11/26/2021 3:59:38 PM By: [name] CHT EMT BS , , Entered By: [name] on 11/26/2021 15:59:38 -------------------------------------------------------------------------------- Encounter Discharge Information Details Patient Name: Date of Service: [name] [place], [name] 11/26/2021 1:00 PM Medical Record Number: [id number] Patient Account Number: [id number] Date of Birth/Sex: Treating RN: Nov 22, 1951 (70 y.o. [name] Primary Care [name]: [name] Other Clinician: [name] Referring [name]: Treating [name]/Extender: Tacey Ruiz in Treatment: 6 Encounter Discharge Information  Items Discharge Condition: Stable Ambulatory Status: Ambulatory Discharge Destination: Home Transportation: Private Auto Accompanied By: self Schedule Follow-up Appointment: No Clinical Summary of Care: Electronic Signature(s) Signed: 11/26/2021 4:07:11 PM By: [name] CHT EMT BS , , Entered By: [name] on 11/26/2021 16:07:11 -------------------------------------------------------------------------------- Vitals Details Patient Name: Date of Service: [name] [place], [name] 11/26/2021 1:00 PM Medical Record Number: [id number] Patient Account Number: [id number] Date of Birth/Sex: Treating RN: 07-Nov-1951 (70 y.o. [name] Primary Care [name]: [name] Other Clinician: [name] Referring Traeson Juarez: Treating [name]/Extender: Tacey Ruiz in Treatment: 6 Vital Signs Time Taken: 13:18 Temperature (F): 98.4 Height (in): 68 Pulse (bpm): 87 Weight (lbs): 127 Respiratory Rate (breaths/min): 16 Body Mass Index (BMI): 19.3 Blood Pressure (mmHg): 101/57 Reference Range: 80 - 120 mg / dl Airway Pulse Oximetry (%): 100 Electronic Signature(s) Signed: 11/26/2021 4:00:15 PM By: [name] CHT EMT BS , , Entered By: [name] on 11/26/2021 16:00:15

## 2021-11-27 ENCOUNTER — Encounter (HOSPITAL_BASED_OUTPATIENT_CLINIC_OR_DEPARTMENT_OTHER): Payer: Medicare Other | Admitting: Physician Assistant

## 2021-11-27 DIAGNOSIS — L598 Other specified disorders of the skin and subcutaneous tissue related to radiation: Secondary | ICD-10-CM | POA: Diagnosis not present

## 2021-11-27 NOTE — Progress Notes (Addendum)
MINTER, BARGAS ([id number]) Visit Report for 11/27/2021 HBO Details Patient Name: Date of Service: [place],  11/27/2021 1:00 PM Medical Record Number: [id number] Patient Account Number: [id number] Date of Birth/Sex: Treating RN: May 09, 1951 (70 y.o. 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 HBO Treatment Details Treatment Number: 6 Patient Type: Outpatient Chamber Type: Monoplace Chamber Serial #: [id number] Treatment Protocol: 2.0 ATA with 90 minutes oxygen, and no air breaks Treatment Details Compression Rate Down: 1.5 psi / minute De-Compression Rate Up: 1.5 psi / minute Air breaks and breathing Decompress Decompress Compress Tx Pressure Begins Reached periods Begins Ends (leave unused spaces blank) Chamber Pressure (ATA 1 2 ------2 1 ) Clock Time (24 hr) 13:39 13:48 - - - - - - 15:18 15:28 Treatment Length: 109 (minutes) Treatment Segments: 4 Vital Signs Capillary Blood Glucose Reference Range: 80 - 120 mg / dl HBO Diabetic Blood Glucose Intervention Range: <131 mg/dl or >086 mg/dl Time Vitals Blood Respiratory Capillary Blood Glucose Pulse Action Type: Pulse: Temperature: Taken: Pressure: Rate: Glucose (mg/dl): Meter #: Oximetry (%) Taken: Pre 13:17 105/50 85 18 98.5 Post 15:32 129/70 68 18 98.2 Treatment Response Treatment Toleration: Well Treatment Completion Status: Treatment Completed without Adverse Event Electronic Signature(s) Signed: 11/27/2021 3:42:46 PM By: Haywood Pao CHT EMT BS , , Signed: 11/27/2021 4:51:36 PM By: Lenda Kelp PA-C Entered By: Haywood Pao on  11/27/2021 15:42:45 -------------------------------------------------------------------------------- HBO Safety Checklist Details Patient Name: Date of Service: [place], [name] 11/27/2021 1:00 PM Medical Record Number: [id number] Patient Account Number: [id number] Date of Birth/Sex: Treating RN: 06-Jan-1951 (70 y.o. [name], [name] Primary Care Gustaf Mccarter: [name] Other Clinician: Haywood Pao Referring Jacques Fife: Treating Aysiah Jurado/Extender: Roberts Gaudy, Tiffany Weeks in Treatment: 6 HBO Safety Checklist Items Safety Checklist Consent Form Signed Patient voided / foley secured and emptied When did you last eato Breakfast Last dose of injectable or oral agent n/a Ostomy pouch emptied and vented if applicable NA All implantable devices assessed, documented and approved NA Intravenous access site secured and place NA Valuables secured Linens and cotton and cotton/polyester blend (less than 51% polyester) Personal oil-based products / skin lotions / body lotions removed Wigs or hairpieces removed NA Smoking or tobacco materials removed NA Books / newspapers / magazines / loose paper removed Cologne, aftershave, perfume and deodorant removed Jewelry removed (may wrap wedding band) Make-up removed NA Hair care products removed Battery operated devices (external) removed Heating patches and chemical warmers removed Titanium eyewear removed NA Not wearing eyewear in chamber (nickel) Nail polish cured greater than 10 hours NA Casting material cured greater than 10 hours NA Hearing aids removed NA Loose dentures or partials removed NA Prosthetics have been removed NA Patient demonstrates correct use of air break device (if applicable) Patient concerns have been addressed Patient grounding bracelet on and cord attached to chamber Specifics for Inpatients (complete in addition to above) Medication sheet sent with patient NA Intravenous medications  needed or due during therapy sent with patient NA Drainage tubes (e.g. nasogastric tube or chest tube secured and vented) NA Endotracheal or Tracheotomy tube secured NA Cuff deflated of air and inflated with saline NA Airway suctioned NA Electronic Signature(s) Signed: 11/27/2021 2:00:28 PM By: Haywood Pao CHT EMT  BS , , Entered By: Haywood Pao on 11/27/2021 14:00:27

## 2021-11-27 NOTE — Progress Notes (Signed)
[name], VORIS ([id number]) Visit Report for 11/27/2021 SuperBill Details Patient Name: Date of Service: [place], [name] 11/27/2021 Medical Record Number: [id number] Patient Account Number: [id number] Date of Birth/Sex: Treating RN: 07/28/1951 (70 y.o. [name] Primary Care Provider: [name] Other Clinician: [name] Referring Provider: Treating Provider/Extender: Alinda Dooms, Tiffany Weeks in Treatment: 6 Diagnosis Coding ICD-10 Codes Code Description L59.8 Other specified disorders of the skin and subcutaneous tissue related to radiation C49.0 Malignant neoplasm of connective and soft tissue of head, face and neck Facility Procedures The patient participates with Medicare or their insurance follows the Medicare Facility Guidelines CPT4 Code Description Modifier Quantity [id number] G0277-(Facility Use Only) HBOT full body chamber, 88min , 4 ICD-10 Diagnosis Description L59.8 Other specified disorders of the skin and subcutaneous tissue related to radiation C49.0 Malignant neoplasm of connective and soft tissue of head, face and neck Physician Procedures Quantity CPT4 Code Description Modifier [id number] - WC PHYS HYPERBARIC OXYGEN THERAPY 1 ICD-10 Diagnosis Description L59.8 Other specified disorders of the skin and subcutaneous tissue related to radiation C49.0 Malignant neoplasm of connective and soft tissue of head, face and neck Electronic Signature(s) Signed: 11/27/2021 3:43:38 PM By: [name] CHT EMT BS , , Signed: 11/27/2021 4:51:36 PM By: Worthy Keeler PA-C Entered By: [name] on 11/27/2021 15:43:38

## 2021-11-27 NOTE — Progress Notes (Addendum)
LEONCE, BALE ([id number]) Visit Report for 11/27/2021 Arrival Information Details Patient Name: Date of Service: [place], [name]  11/27/2021 1:00 PM Medical Record Number: [id number] Patient Account Number: [id number] Date of Birth/Sex: Treating RN: February 07, 1951 (70 y.o. [name], Tammi Klippel Primary Care Rajvir Ernster: [name] Other Clinician: [name] Referring Estefanny Moler: Treating Mahkai Fangman/Extender: Alinda Dooms, Tiffany Weeks in Treatment: 6 Visit Information History Since Last Visit All ordered tests and consults were completed: Yes Patient Arrived: Ambulatory Added or deleted any medications: No Arrival Time: 13:00 Any new allergies or adverse reactions: No Accompanied By: self Had a fall or experienced change in No Transfer Assistance: None activities of daily living that may affect Patient Identification Verified: Yes risk of falls: Secondary Verification Process Completed: Yes Signs or symptoms of abuse/neglect since last visito No Patient Requires Transmission-Based Precautions: No Hospitalized since last visit: No Patient Has Alerts: No Implantable device outside of the clinic excluding No cellular tissue based products placed in the center since last visit: Pain Present Now: No Electronic Signature(s) Signed: 11/27/2021 1:57:54 PM By: [name] CHT EMT BS , , Entered By: [name] on 11/27/2021 13:57:53 -------------------------------------------------------------------------------- Encounter Discharge Information Details Patient Name: Date of Service: [name], [name] 11/27/2021 1:00 PM Medical Record Number: [id number] Patient Account Number: [id number] Date of Birth/Sex: Treating RN: 09-Aug-1951 (70 y.o. [name] Primary Care Caitriona Sundquist: [name] Other Clinician: [name] Referring Babyboy Loya: Treating Elveria Lauderbaugh/Extender: Alinda Dooms, Tiffany Weeks in Treatment: 6 Encounter Discharge Information  Items Discharge Condition: Stable Ambulatory Status: Ambulatory Discharge Destination: Home Transportation: Private Auto Accompanied By: self Schedule Follow-up Appointment: No Clinical Summary of Care: Electronic Signature(s) Signed: 11/27/2021 3:44:11 PM By: [name] CHT EMT BS , , Entered By: [name] on 11/27/2021 15:44:11 -------------------------------------------------------------------------------- [place] Details Patient Name: Date of Service: [name], [name] 11/27/2021 1:00 PM Medical Record Number: [id number] Patient Account Number: [id number] Date of Birth/Sex: Treating RN: 10-01-1951 (70 y.o. [name] Primary Care Halea Lieb: [name] Other Clinician: [name] Referring Akim Watkinson: Treating Shota Kohrs/Extender: Alinda Dooms, Tiffany Weeks in Treatment: 6 Vital Signs Time Taken: 13:17 Temperature (F): 98.5 Height (in): 68 Pulse (bpm): 85 Weight (lbs): 127 Respiratory Rate (breaths/min): 18 Body Mass Index (BMI): 19.3 Blood Pressure (mmHg): 105/50 Reference Range: 80 - 120 mg / dl Electronic Signature(s) Signed: 11/27/2021 1:58:45 PM By: [name] CHT EMT BS , , Entered By: [name] on 11/27/2021 13:58:45

## 2021-11-28 ENCOUNTER — Encounter (HOSPITAL_BASED_OUTPATIENT_CLINIC_OR_DEPARTMENT_OTHER): Payer: Medicare Other | Admitting: Internal Medicine

## 2021-11-28 ENCOUNTER — Other Ambulatory Visit: Payer: Self-pay

## 2021-11-28 ENCOUNTER — Encounter: Payer: Self-pay | Admitting: Family Medicine

## 2021-11-28 ENCOUNTER — Ambulatory Visit (INDEPENDENT_AMBULATORY_CARE_PROVIDER_SITE_OTHER): Payer: Medicare Other | Admitting: Family Medicine

## 2021-11-28 VITALS — BP 99/50 | HR 89 | Temp 98.2°F | Ht 68.0 in | Wt 131.1 lb

## 2021-11-28 DIAGNOSIS — R0602 Shortness of breath: Secondary | ICD-10-CM | POA: Diagnosis not present

## 2021-11-28 DIAGNOSIS — E039 Hypothyroidism, unspecified: Secondary | ICD-10-CM

## 2021-11-28 DIAGNOSIS — R062 Wheezing: Secondary | ICD-10-CM

## 2021-11-28 DIAGNOSIS — C099 Malignant neoplasm of tonsil, unspecified: Secondary | ICD-10-CM

## 2021-11-28 DIAGNOSIS — L598 Other specified disorders of the skin and subcutaneous tissue related to radiation: Secondary | ICD-10-CM | POA: Diagnosis not present

## 2021-11-28 DIAGNOSIS — K222 Esophageal obstruction: Secondary | ICD-10-CM

## 2021-11-28 DIAGNOSIS — R1310 Dysphagia, unspecified: Secondary | ICD-10-CM | POA: Diagnosis not present

## 2021-11-28 MED ORDER — ALBUTEROL SULFATE HFA 108 (90 BASE) MCG/ACT IN AERS
2.0000 | INHALATION_SPRAY | Freq: Four times a day (QID) | RESPIRATORY_TRACT | 2 refills | Status: AC | PRN
Start: 2021-11-28 — End: ?

## 2021-11-28 NOTE — Progress Notes (Addendum)
MITCH, SHILLINGTON ([id number]) Visit Report for 11/28/2021 HBO Details Patient Name: Date of Service: [place], [name]. 11/28/2021 1:00 PM Medical Record Number: [id number] Patient Account Number: [id number] Date of Birth/Sex: Treating RN: [birth year] (70 y.o. 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 HBO Treatment Details Treatment Number: 7 Patient Type: Outpatient Chamber Type: Monoplace Chamber Serial #: [id number] Treatment Protocol: 2.0 ATA with 90 minutes oxygen, and no air breaks Treatment Details Compression Rate Down: 1.5 psi / minute De-Compression Rate Up: 1.5 psi / minute Air breaks and breathing Decompress Decompress Compress Tx Pressure Begins Reached periods Begins Ends (leave unused spaces blank) Chamber Pressure (ATA 1 2 ------2 1 ) Clock Time (24 hr) 13:19 13:29 - - - - - - 14:59 15:10 Treatment Length: 111 (minutes) Treatment Segments: 4 Vital Signs Capillary Blood Glucose Reference Range: 80 - 120 mg / dl HBO Diabetic Blood Glucose Intervention Range: <131 mg/dl or >528 mg/dl Time Vitals Blood Respiratory Capillary Blood Glucose Pulse Action Type: Pulse: Temperature: Taken: Pressure: Rate: Glucose (mg/dl): Meter #: Oximetry (%) Taken: Pre 13:03 98/52 85 16 98.5 Post 15:13 115/69 67 18 98.3 Treatment Response Treatment Toleration: Well Treatment Completion Status: Treatment Completed without Adverse Event Quay Simkin Notes No concerns with treatment given Physician HBO Attestation: I certify that I supervised this HBO treatment in accordance with Medicare guidelines. A trained emergency  response team is readily available per Yes hospital policies and procedures. Continue HBOT as ordered. Yes Electronic Signature(s) Signed: 11/28/2021 4:05:05 PM By: [name] MD Previous Signature: 11/28/2021 3:45:14 PM Version By: Haywood Pao CHT EMT BS , , Entered By: [name] on 11/28/2021 16:03:43 -------------------------------------------------------------------------------- HBO Safety Checklist Details Patient Name: Date of Service: [place], [name] 11/28/2021 1:00 PM Medical Record Number: [id number] Patient Account Number: [id number] Date of Birth/Sex: Treating RN: November 21, 1951 (70 y.o. [name], [name] Primary Care Dequavius Kuhner: [name] Other Clinician: Haywood Pao Referring Dyke Weible: Treating Avyaan Summer/Extender: [name] in Treatment: 6 HBO Safety Checklist Items Safety Checklist Consent Form Signed Patient voided / foley secured and emptied When did you last eato Breakfast Last dose of injectable or oral agent n/a Ostomy pouch emptied and vented if applicable NA All implantable devices assessed, documented and approved NA Intravenous access site secured and place NA Valuables secured Linens and cotton and cotton/polyester blend (less than 51% polyester) Personal oil-based products / skin lotions / body lotions removed Wigs or hairpieces removed NA Smoking or tobacco materials removed NA Books / newspapers / magazines / loose paper removed Cologne, aftershave, perfume and deodorant removed Jewelry removed (may wrap wedding band) Make-up removed NA Hair care products removed Battery operated devices (external) removed Heating patches and chemical warmers removed Titanium eyewear removed Not wearing eyewear in chamber (nickel) Nail polish cured greater than 10 hours NA Casting material cured greater than 10 hours NA Hearing aids removed NA Loose dentures or partials removed NA Prosthetics have been  removed NA Patient demonstrates correct use of air break device (if applicable) Patient concerns have been addressed Patient grounding bracelet on and cord attached to chamber Specifics for Inpatients (complete in addition to above) Medication sheet sent with patient NA Intravenous medications needed or due during therapy sent with patient NA Drainage  tubes (e.g. nasogastric tube or chest tube secured and vented) NA Endotracheal or Tracheotomy tube secured NA Cuff deflated of air and inflated with saline NA Airway suctioned NA Electronic Signature(s) Signed: 11/28/2021 1:52:04 PM By: Haywood Pao CHT EMT BS , , Entered By: Haywood Pao on 11/28/2021 13:52:04

## 2021-11-28 NOTE — Progress Notes (Signed)
[name], [name] ([id number]) Visit Report for 11/28/2021 SuperBill Details Patient Name: Date of Service: [place], [place], [name] 11/28/2021 Medical Record Number: [id number] Patient Account Number: [id number] Date of Birth/Sex: Treating RN: [birth year] (70 y.o. [name] Primary Care Provider: [name] Other Clinician: [name] Referring Provider: Treating Provider/Extender: [name], [name] in Treatment: 6 Diagnosis Coding ICD-10 Codes Code Description L59.8 Other specified disorders of the skin and subcutaneous tissue related to radiation C49.0 Malignant neoplasm of connective and soft tissue of head, face and neck Facility Procedures The patient participates with Medicare or their insurance follows the Medicare Facility Guidelines CPT4 Code Description Modifier Quantity [id number] G0277-(Facility Use Only) HBOT full body chamber, 22min , 4 ICD-10 Diagnosis Description L59.8 Other specified disorders of the skin and subcutaneous tissue related to radiation C49.0 Malignant neoplasm of connective and soft tissue of head, face and neck Physician Procedures Quantity CPT4 Code Description Modifier [id number] - WC PHYS HYPERBARIC OXYGEN THERAPY 1 ICD-10 Diagnosis Description L59.8 Other specified disorders of the skin and subcutaneous tissue related to radiation C49.0 Malignant neoplasm of connective and soft tissue of head, face and neck Electronic Signature(s) Signed: 11/28/2021 3:45:35 PM By: [name] CHT EMT BS , , Signed: 11/28/2021 4:05:05 PM By: [name] MD Entered By: [name] on 11/28/2021 15:45:35

## 2021-11-28 NOTE — Patient Instructions (Signed)

## 2021-11-28 NOTE — Progress Notes (Signed)
Acute Office Visit  Subjective:    Patient ID: [name], male    DOB: September 03, 1951, 70 y.o.   MRN: [id number]  Chief Complaint  Patient presents with   trouble breathing    HPI Patient is in today for difficulty breathing. He has been going to the hyperbaric chamber for treatment to help with his scar tissue. A few days ago he an episode where he had trouble breathing for about 10 minutes. This was the fourth time this has happened. 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 He reports a wheeze when he tries to inhale. His symptoms improve when trying to calm down and breathe through his nose and with coughing up his secretions. The other times it has happened it lasted only for a few minutes. He denies shortness of breath or wheezing at other times. He denies a history of asthma. He denies chest pain.    BP Readings from Last 3 Encounters:  11/28/21 (!) 99/50  08/16/21 (!) 86/46  08/05/21 (!) 112/54     Past Medical History:  Diagnosis Date   HOH (hard of hearing)    Hyperlipidemia    Mass of oral cavity 05/12/2018   Positive FIT (fecal immunochemical test) 05/12/2018   PTSD (post-traumatic stress disorder)    has not been offically diagnosised   Tonsillar cancer (HCC) 06/02/2018    Past Surgical History:  Procedure Laterality Date   APPENDECTOMY  1969   COLONOSCOPY W/ POLYPECTOMY     remote past   COLONOSCOPY WITH PROPOFOL N/A 02/16/2020   Procedure: COLONOSCOPY WITH PROPOFOL;  Surgeon: Corbin Ade, MD;  [facility];  Service: Endoscopy;  Laterality: N/A;  1:45pm   ESOPHAGOGASTRODUODENOSCOPY  08/04/2018   Dr. Lovell Sheehan: erythema of larynx, normal stomach, normal duodenum. PEG Placed 20 F, bumper at 2.5 cm   ESOPHAGOGASTRODUODENOSCOPY (EGD) WITH PROPOFOL N/A 08/04/2018   Procedure: ESOPHAGOGASTRODUODENOSCOPY (EGD) WITH PROPOFOL;  Surgeon: Franky Macho, MD;   Location: [facility];  Service: Gastroenterology;  Laterality: N/A;   ESOPHAGOGASTRODUODENOSCOPY (EGD) WITH PROPOFOL N/A 02/16/2020   Procedure: ESOPHAGOGASTRODUODENOSCOPY (EGD) WITH PROPOFOL;  Surgeon: Corbin Ade, MD;  Location: [facility];  Service: Endoscopy;  Laterality: N/A;   FINGER FRACTURE SURGERY Left    MULTIPLE EXTRACTIONS WITH ALVEOLOPLASTY N/A 06/15/2018   Procedure: Extraction of tooth #'s 2,12,13,18,20,and 21 with alveoloplasty and gross debridement of remaining teeth;  Surgeon: Charlynne Pander, DDS;  Location: [facility];  Service: Oral Surgery;  Laterality: N/A;   PEG PLACEMENT N/A 08/04/2018   Procedure: PERCUTANEOUS ENDOSCOPIC GASTROSTOMY (PEG) PLACEMENT;  Surgeon: Franky Macho, MD;  Location: [facility];  Service: Gastroenterology;  Laterality: N/A;   POLYPECTOMY  02/16/2020   Procedure: POLYPECTOMY;  Surgeon: Corbin Ade, MD;  [facility]: [facility];  Service: Endoscopy;;   PORTACATH PLACEMENT Left 07/02/2018   Procedure: INSERTION PORT-A-CATH;  Surgeon: Franky Macho, MD;  Location: [facility];  Service: General;  Laterality: Left;    Family History  Problem Relation Age of Onset   Diabetes Father        died at age 88    Colon cancer Neg Hx     Social History   Socioeconomic History   Marital status: Divorced    Spouse name: Not on file   Number of children: Not on file   Years of education: Not on file   Highest education level: Not on file  Occupational  History   Occupation: veteran, retired  Tobacco Use   Smoking status: Former    Years: 15.00    Types: Cigarettes    Quit date: 2000    Years since quitting: 22.9   Smokeless tobacco: Never  Vaping Use   Vaping Use: Never used  Substance and Sexual Activity   Alcohol use: Not Currently    Alcohol/week: 12.0 standard drinks    Types: 12 Cans of beer per week   Drug use: Never   Sexual activity: Not Currently  Other Topics Concern   Not on file  Social History Narrative   Mr. 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  He lives independently with his dog here in [place].  He formally lived in [place].   Social Determinants of Health   Financial Resource Strain: Low Risk    Difficulty of Paying Living Expenses: Not very hard  Food Insecurity: No Food Insecurity   Worried About Programme researcher, broadcasting/film/video in the Last Year: Never true   Ran Out of Food in the Last Year: Never true  Transportation Needs: No Transportation Needs   Lack of Transportation (Medical): No   Lack of Transportation (Non-Medical): No  Physical Activity: Insufficiently Active   Days of Exercise per Week: 7 days   Minutes of Exercise per Session: 10 min  Stress: No Stress Concern Present   Feeling of Stress : Not at all  Social Connections: Socially Isolated   Frequency of Communication with Friends and Family: Once a week   Frequency of Social Gatherings with Friends and Family: Never   Attends Religious Services: Never   Database administrator or Organizations: No   Attends Engineer, structural: Never   Marital Status: Divorced  Catering manager Violence: Not At Risk   Fear of Current or Ex-Partner: No   Emotionally Abused: No   Physically Abused: No   Sexually Abused: No    Outpatient Medications Prior to Visit  Medication Sig Dispense Refill   Ensure Plus (ENSURE PLUS) LIQD Give via PEG. 2 bottles in morning. 2 in afternoon. 1 before bed. (Patient taking differently: Place 237 mLs into feeding tube 2 (two) times daily between meals. Pt uses 2 cans in the morning and 2 in the evening via peg tube)  0   gabapentin (NEURONTIN) 250 MG/5ML solution TAKE 5 ML BY MOUTH TWICE DAILY THROUGH FEEDING TUBE 300 mL 2   ibuprofen (ADVIL,MOTRIN) 200 MG tablet Take 400 mg by mouth every 6 (six) hours as needed for moderate pain.      levothyroxine (SYNTHROID) 112 MCG tablet Take 1 tablet (112 mcg total) by mouth daily before breakfast. 90 tablet 1   OVER THE COUNTER MEDICATION Naked juice  protein     OVER THE COUNTER MEDICATION Take 1 Bottle by mouth daily. [place] Protein     OVER THE COUNTER MEDICATION Take 1 Bottle by mouth daily. [place] protein     pregabalin (LYRICA) 150 MG capsule Take 1 capsule (150 mg total) by mouth 2 (two) times daily. Feeding tube 60 capsule 0   No facility-administered medications prior to visit.    No Known Allergies  Review of Systems As per HPI.     Objective:    Physical Exam Vitals and nursing note reviewed.  Constitutional:      General: He is not in acute distress.    Appearance: He is not ill-appearing, toxic-appearing or diaphoretic.  HENT:  Head: Normocephalic and atraumatic.  Eyes:     Extraocular Movements: Extraocular movements intact.  Cardiovascular:     Rate and Rhythm: Normal rate and regular rhythm.     Heart sounds: Normal heart sounds. No murmur heard. Pulmonary:     Effort: Pulmonary effort is normal. No respiratory distress.     Breath sounds: Normal breath sounds.  Abdominal:     General: There is no distension.     Palpations: Abdomen is soft.     Tenderness: There is no abdominal tenderness. There is no guarding or rebound.  Musculoskeletal:     Right lower leg: No edema.     Left lower leg: No edema.  Skin:    General: Skin is warm and dry.  Neurological:     General: No focal deficit present.     Mental Status: He is alert and oriented to person, place, and time.  Psychiatric:        Mood and Affect: Mood normal.        Behavior: Behavior normal.     BP (!) 99/50   Pulse 89   Temp 98.2 F (36.8 C) (Temporal)   Ht 5\' 8"  (1.727 m)   Wt 131 lb 2 oz (59.5 kg)   SpO2 99%   BMI 19.94 kg/m  Wt Readings from Last 3 Encounters:  11/28/21 131 lb 2 oz (59.5 kg)  08/16/21 130 lb 6 oz (59.1 kg)  08/05/21 135 lb (61.2 kg)    Health Maintenance Due  Topic Date Due   Zoster Vaccines- Shingrix (1 of 2) Never done   COVID-19 Vaccine (4 - Booster for Pfizer series) 11/13/2020     There are no preventive care reminders to display for this patient.   Lab Results  Component Value Date   TSH 5.384 (H) 07/29/2021   Lab Results  Component Value Date   WBC 1.8 (L) 07/29/2021   HGB 11.1 (L) 07/29/2021   HCT 35.2 (L) 07/29/2021   MCV 103.5 (H) 07/29/2021   PLT 145 (L) 07/29/2021   Lab Results  Component Value Date   NA 137 07/29/2021   K 4.5 07/29/2021   CO2 33 (H) 07/29/2021   GLUCOSE 115 (H) 07/29/2021   BUN 20 07/29/2021   CREATININE 0.83 07/29/2021   BILITOT 0.7 07/29/2021   ALKPHOS 59 07/29/2021   AST 19 07/29/2021   ALT 14 07/29/2021   PROT 6.7 07/29/2021   ALBUMIN 3.7 07/29/2021   CALCIUM 9.3 07/29/2021   ANIONGAP 6 07/29/2021   EGFR 92 04/02/2021   Lab Results  Component Value Date   CHOL 182 05/12/2018   Lab Results  Component Value Date   HDL 35 (L) 05/12/2018   Lab Results  Component Value Date   LDLCALC 127 (H) 05/12/2018   Lab Results  Component Value Date   TRIG 102 05/12/2018   Lab Results  Component Value Date   CHOLHDL 5.2 (H) 05/12/2018   No results found for: HGBA1C     Assessment & Plan:   Raji was seen today for trouble breathing.  Diagnoses and all orders for this visit:  Wheezing Shortness of breath Discussed likely due to difficulty clearing secretions relation to radiation fibrosis syndrome. Will try albuterol inhaler prn to rule out possibility of asthma. Follow up with radiation oncology if albuterol does not help.           -     albuterol (VENTOLIN HFA) 108 (90 Base) MCG/ACT   inhaler; Inhale 2  puffs into the lungs every 6 (six) hours as needed for wheezing or shortness of breath.  Dysphagia, unspecified type Esophageal stricture Tonsillar cancer (HCC) Cancer in remission. 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 This is managed by radiation therapy and ENT. He has failed PT. Currently going to hyperbaric chamber.   Hypothyroidism, unspecified type On synthroid. Labs pending. Discussed trying  to order liquid form pending results.  -     CBC with Differential/Platelet -     CMP14+EGFR -     TSH  Return in about 3 months (around 02/26/2022) for chronic follow up.  The patient indicates understanding of these issues and agrees with the plan.   Gabriel Earing, FNP

## 2021-11-28 NOTE — Progress Notes (Addendum)
[name], [name] ([id number]) Visit Report for 11/28/2021 Arrival Information Details Patient Name: Date of Service: [name], [name] 11/28/2021 1:00 PM Medical Record Number: [id number] Patient Account Number: [id number] Date of Birth/Sex: Treating RN: 1951/08/05 (70 y.o. [name], [name] Primary Care [name]: [name] Other Clinician: Donavan Juarez Referring [name]: Treating [name]/Extender: [name] in Treatment: 6 Visit Information History Since Last Visit All ordered tests and consults were completed: Yes Patient Arrived: Ambulatory Added or deleted any medications: No Arrival Time: 12:41 Any new allergies or adverse reactions: No Accompanied By: self Had a fall or experienced change in No Transfer Assistance: None activities of daily living that may affect Patient Identification Verified: Yes risk of falls: Secondary Verification Process Completed: Yes Signs or symptoms of abuse/neglect since last visito No Patient Requires Transmission-Based Precautions: No Hospitalized since last visit: No Patient Has Alerts: No Implantable device outside of the clinic excluding No cellular tissue based products placed in the center since last visit: Pain Present Now: No Electronic Signature(s) Signed: 11/28/2021 1:49:20 PM By: [name] CHT EMT BS , , Entered By: [name] on 11/28/2021 13:49:20 -------------------------------------------------------------------------------- Encounter Discharge Information Details Patient Name: Date of Service: [name] Lango, [name] 11/28/2021 1:00 PM Medical Record Number: [id number] Patient Account Number: [id number] Date of Birth/Sex: Treating RN: 1951/04/01 (70 y.o. [name] Primary Care [name]: [name] Other Clinician: Donavan Juarez Referring [name]: Treating [name]/Extender: [name] in Treatment: 6 Encounter Discharge Information  Items Discharge Condition: Stable Ambulatory Status: Ambulatory Discharge Destination: Home Transportation: Private Auto Accompanied By: self Schedule Follow-up Appointment: No Clinical Summary of Care: Electronic Signature(s) Signed: 11/28/2021 3:46:04 PM By: [name] CHT EMT BS , , Entered By: [name] on 11/28/2021 15:46:04 -------------------------------------------------------------------------------- [place] Details Patient Name: Date of Service: [name] Lango, [name] 11/28/2021 1:00 PM Medical Record Number: [id number] Patient Account Number: [id number] Date of Birth/Sex: Treating RN: [birth year] (70 y.o. [name] Primary Care [name]: [name] Other Clinician: Donavan Juarez Referring [name]: Treating [name]/Extender: [name] in Treatment: 6 Vital Signs Time Taken: 13:03 Temperature (F): 98.5 Height (in): 68 Pulse (bpm): 85 Weight (lbs): 127 Respiratory Rate (breaths/min): 16 Body Mass Index (BMI): 19.3 Blood Pressure (mmHg): 98/52 Reference Range: 80 - 120 mg / dl Electronic Signature(s) Signed: 11/28/2021 1:50:37 PM By: [name] CHT EMT BS , , Entered By: [name] on 11/28/2021 13:50:37

## 2021-11-29 ENCOUNTER — Other Ambulatory Visit: Payer: Self-pay | Admitting: Family Medicine

## 2021-11-29 ENCOUNTER — Encounter (HOSPITAL_BASED_OUTPATIENT_CLINIC_OR_DEPARTMENT_OTHER): Payer: Medicare Other | Admitting: Internal Medicine

## 2021-11-29 DIAGNOSIS — L598 Other specified disorders of the skin and subcutaneous tissue related to radiation: Secondary | ICD-10-CM | POA: Diagnosis not present

## 2021-11-29 DIAGNOSIS — C490 Malignant neoplasm of connective and soft tissue of head, face and neck: Secondary | ICD-10-CM | POA: Diagnosis not present

## 2021-11-29 LAB — CMP14+EGFR
ALT: 12 IU/L (ref 0–44)
AST: 16 IU/L (ref 0–40)
Albumin/Globulin Ratio: 1.8 (ref 1.2–2.2)
Albumin: 4.1 g/dL (ref 3.7–4.7)
Alkaline Phosphatase: 72 IU/L (ref 44–121)
BUN/Creatinine Ratio: 18 (ref 10–24)
BUN: 16 mg/dL (ref 8–27)
Bilirubin Total: 0.7 mg/dL (ref 0.0–1.2)
CO2: 30 mmol/L — ABNORMAL HIGH (ref 20–29)
Calcium: 9.4 mg/dL (ref 8.6–10.2)
Chloride: 97 mmol/L (ref 96–106)
Creatinine, Ser: 0.87 mg/dL (ref 0.76–1.27)
Globulin, Total: 2.3 g/dL (ref 1.5–4.5)
Glucose: 93 mg/dL (ref 70–99)
Potassium: 4.3 mmol/L (ref 3.5–5.2)
Sodium: 137 mmol/L (ref 134–144)
Total Protein: 6.4 g/dL (ref 6.0–8.5)
eGFR: 92 mL/min/{1.73_m2} (ref >59)

## 2021-11-29 LAB — CBC WITH DIFFERENTIAL/PLATELET
Basophils Absolute: 0 10*3/uL (ref 0.0–0.2)
Basos: 1 %
EOS (ABSOLUTE): 0 10*3/uL (ref 0.0–0.4)
Eos: 1 %
Hematocrit: 35.4 % — ABNORMAL LOW (ref 37.5–51.0)
Hemoglobin: 11.5 g/dL — ABNORMAL LOW (ref 13.0–17.7)
Immature Grans (Abs): 0 10*3/uL (ref 0.0–0.1)
Immature Granulocytes: 0 %
Lymphocytes Absolute: 0.5 10*3/uL — ABNORMAL LOW (ref 0.7–3.1)
Lymphs: 28 %
MCH: 31.5 pg (ref 26.6–33.0)
MCHC: 32.5 g/dL (ref 31.5–35.7)
MCV: 97 fL (ref 79–97)
Monocytes Absolute: 0.2 10*3/uL (ref 0.1–0.9)
Monocytes: 14 %
Neutrophils Absolute: 0.9 10*3/uL — ABNORMAL LOW (ref 1.4–7.0)
Neutrophils: 56 %
Platelets: 133 10*3/uL — ABNORMAL LOW (ref 150–450)
RBC: 3.65 x10E6/uL — ABNORMAL LOW (ref 4.14–5.80)
RDW: 11.9 % (ref 11.6–15.4)
WBC: 1.6 10*3/uL — CL (ref 3.4–10.8)

## 2021-11-29 LAB — TSH: TSH: 2.19 u[IU]/mL (ref 0.450–4.500)

## 2021-11-29 NOTE — Progress Notes (Addendum)
[name], Juarez ([id number]) Visit Report for 11/29/2021 HBO Details Patient Name: Date of Service: [place], [name]. 11/29/2021 10:00 A M Medical Record Number: [id number] Patient Account Number: [id number] Date of Birth/Sex: Treating RN: 29-Nov-1951 (70 y.o. 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 HBO Treatment Details Treatment Number: 8 Patient Type: Outpatient Chamber Type: Monoplace Chamber Serial #: [id number] Treatment Protocol: 2.0 ATA with 90 minutes oxygen, and no air breaks Treatment Details Compression Rate Down: 2.0 psi / minute De-Compression Rate Up: 2.0 psi / minute Air breaks and breathing Decompress Decompress Compress Tx Pressure Begins Reached periods Begins Ends (leave unused spaces blank) Chamber Pressure (ATA 1 2 ------2 1 ) Clock Time (24 hr) 10:23 10:33 - - - - - - 12:03 12:13 Treatment Length: 110 (minutes) Treatment Segments: 4 Vital Signs Capillary Blood Glucose Reference Range: 80 - 120 mg / dl HBO Diabetic Blood Glucose Intervention Range: <131 mg/dl or >295 mg/dl Time Vitals Blood Respiratory Capillary Blood Glucose Pulse Action Type: Pulse: Temperature: Taken: Pressure: Rate: Glucose (mg/dl): Meter #: Oximetry (%) Taken: Pre 09:55 118/65 83 16 98.2 Post 12:17 118/67 67 16 98.3 Treatment Response Treatment Toleration: Well Treatment Completion Status: Treatment Completed without Adverse Event Physician HBO Attestation: I certify that I supervised this HBO treatment in accordance with Medicare guidelines. A trained emergency response team is readily available per  Yes hospital policies and procedures. Continue HBOT as ordered. Yes Electronic Signature(s) Signed: 12/02/2021 11:57:06 AM By: Geralyn [name] DO Previous Signature: 11/29/2021 1:05:02 PM Version By: Haywood Pao CHT EMT BS , , Entered By: Geralyn [name] on 12/02/2021 11:48:25 -------------------------------------------------------------------------------- HBO Safety Checklist Details Patient Name: Date of Service: [place], [name] 11/29/2021 10:00 A M Medical Record Number: [id number] Patient Account Number: [id number] Date of Birth/Sex: Treating RN: 1951/03/24 (70 y.o. [name] Primary Care [name]: [name] Other Clinician: Haywood Pao Referring [name]: Treating [name]/Extender: [name] Weeks in Treatment: 6 HBO Safety Checklist Items Safety Checklist Consent Form Signed Patient voided / foley secured and emptied When did you last eato 0700 Last dose of injectable or oral agent n/a Ostomy pouch emptied and vented if applicable NA All implantable devices assessed, documented and approved NA Intravenous access site secured and place NA Valuables secured Linens and cotton and cotton/polyester blend (less than 51% polyester) Personal oil-based products / skin lotions / body lotions removed Wigs or hairpieces removed NA Smoking or tobacco materials removed NA Books / newspapers / magazines / loose paper removed Cologne, aftershave, perfume and deodorant removed Jewelry removed (may wrap wedding band) Make-up removed NA Hair care products removed Battery operated devices (external) removed Heating patches and chemical warmers removed Titanium eyewear removed Not wearing eyewear in chamber (nickel) Nail polish cured greater than 10 hours NA Casting material cured greater than 10 hours NA Hearing aids removed NA Loose dentures or partials removed NA Prosthetics have been removed NA Patient demonstrates  correct use of air break device (if applicable) Patient concerns have been addressed Patient grounding bracelet on and cord attached to chamber Specifics for Inpatients (complete in addition to above) Medication sheet sent with patient NA Intravenous medications needed or due during therapy sent with patient NA Drainage tubes (e.g. nasogastric tube or  chest tube secured and vented) NA Endotracheal or Tracheotomy tube secured NA Cuff deflated of air and inflated with saline NA Airway suctioned NA Electronic Signature(s) Signed: 11/29/2021 11:25:42 AM By: Haywood Pao CHT EMT BS , , Entered By: Haywood Pao on 11/29/2021 11:25:42

## 2021-11-29 NOTE — Progress Notes (Addendum)
ERMIAS, TOMEO ([id number]) Visit Report for 11/29/2021 Arrival Information Details Patient Name: Date of Service: [place], [name] 11/29/2021 10:00 A M Medical Record Number: [id number] Patient Account Number: [id number] Date of Birth/Sex: Treating RN: 1951-08-09 (70 y.o. Ulyses Amor, Vaughan Basta Primary Care Jai Bear: [name] Other Clinician: [name] Referring Lanard Arguijo: Treating Gisela Lea/Extender: [name] in Treatment: 6 Visit Information History Since Last Visit All ordered tests and consults were completed: Yes Patient Arrived: Ambulatory Added or deleted any medications: No Arrival Time: 09:19 Any new allergies or adverse reactions: No Accompanied By: self Had a fall or experienced change in No Transfer Assistance: None activities of daily living that may affect Patient Identification Verified: Yes risk of falls: Secondary Verification Process Completed: Yes Signs or symptoms of abuse/neglect since last visito No Patient Requires Transmission-Based Precautions: No Hospitalized since last visit: No Patient Has Alerts: No Implantable device outside of the clinic excluding No cellular tissue based products placed in the center since last visit: Pain Present Now: No Electronic Signature(s) Signed: 11/29/2021 11:24:06 AM By: [name] CHT EMT BS , , Entered By: [name] on 11/29/2021 11:24:06 -------------------------------------------------------------------------------- Encounter Discharge Information Details Patient Name: Date of Service: [name], [name] 11/29/2021 10:00 A M Medical Record Number: [id number] Patient Account Number: [id number] Date of Birth/Sex: Treating RN: [birth year] (70 y.o. [name] Primary Care Sedale Jenifer: [name] Other Clinician: [name] Referring Jermy Couper: Treating Colon Rueth/Extender: [name] in Treatment: 6 Encounter Discharge  Information Items Discharge Condition: Stable Ambulatory Status: Ambulatory Discharge Destination: Home Transportation: Private Auto Accompanied By: self Schedule Follow-up Appointment: No Clinical Summary of Care: Electronic Signature(s) Signed: 11/29/2021 1:05:43 PM By: [name] CHT EMT BS , , Entered By: [name] on 11/29/2021 13:05:43 -------------------------------------------------------------------------------- [place] Details Patient Name: Date of Service: [name], [name] 11/29/2021 10:00 A M Medical Record Number: [id number] Patient Account Number: [id number] Date of Birth/Sex: Treating RN: October 17, 1951 (70 y.o. [name] Primary Care Squire Withey: [name] Other Clinician: [name] Referring Lela Gell: Treating Loni Delbridge/Extender: [name] Weeks in Treatment: 6 Vital Signs Time Taken: 09:55 Temperature (F): 98.2 Height (in): 68 Pulse (bpm): 83 Weight (lbs): 127 Respiratory Rate (breaths/min): 16 Body Mass Index (BMI): 19.3 Blood Pressure (mmHg): 118/65 Reference Range: 80 - 120 mg / dl Electronic Signature(s) Signed: 11/29/2021 11:24:32 AM By: [name] CHT EMT BS , , Entered By: [name] on 11/29/2021 11:24:32

## 2021-12-02 ENCOUNTER — Other Ambulatory Visit: Payer: Self-pay

## 2021-12-02 ENCOUNTER — Other Ambulatory Visit: Payer: Self-pay | Admitting: Family Medicine

## 2021-12-02 ENCOUNTER — Encounter (HOSPITAL_BASED_OUTPATIENT_CLINIC_OR_DEPARTMENT_OTHER): Payer: Medicare Other | Admitting: Internal Medicine

## 2021-12-02 DIAGNOSIS — L598 Other specified disorders of the skin and subcutaneous tissue related to radiation: Secondary | ICD-10-CM | POA: Diagnosis not present

## 2021-12-02 DIAGNOSIS — C490 Malignant neoplasm of connective and soft tissue of head, face and neck: Secondary | ICD-10-CM

## 2021-12-02 DIAGNOSIS — E039 Hypothyroidism, unspecified: Secondary | ICD-10-CM

## 2021-12-02 MED ORDER — LEVOTHYROXINE SODIUM 112 MCG PO TABS: 112.0000 ug | ORAL_TABLET | Freq: Every day | ORAL | 3 refills | Status: AC

## 2021-12-02 NOTE — Progress Notes (Addendum)
[name], [name] ([id number]) Visit Report for 12/02/2021 Arrival Information Details Patient Name: Date of Service: [name], [name] 12/02/2021 1:00 PM Medical Record Number: [id number] Patient Account Number: [id number] Date of Birth/Sex: Treating RN: July 29, 1951 (70 y.o. [name], Tammi Klippel Primary Care Darl Brisbin: [name] Other Clinician: [name] Referring Kalana Yust: Treating Briget Shaheed/Extender: [name] in Treatment: 6 Visit Information History Since Last Visit All ordered tests and consults were completed: Yes Patient Arrived: Ambulatory Added or deleted any medications: No Arrival Time: 12:48 Any new allergies or adverse reactions: No Accompanied By: self Had a fall or experienced change in No Transfer Assistance: None activities of daily living that may affect Patient Identification Verified: Yes risk of falls: Secondary Verification Process Completed: Yes Signs or symptoms of abuse/neglect since last visito No Patient Requires Transmission-Based Precautions: No Hospitalized since last visit: No Patient Has Alerts: No Implantable device outside of the clinic excluding No cellular tissue based products placed in the center since last visit: Pain Present Now: No Electronic Signature(s) Signed: 12/02/2021 3:38:38 PM By: [name] CHT EMT BS , , Entered By: [name] on 12/02/2021 15:38:38 -------------------------------------------------------------------------------- Encounter Discharge Information Details Patient Name: Date of Service: [name], [name] 12/02/2021 1:00 PM Medical Record Number: [id number] Patient Account Number: [id number] Date of Birth/Sex: Treating RN: 1951-10-04 (70 y.o. [name] Primary Care Kehlani Vancamp: [name] Other Clinician: [name] Referring Dhruv Christina: Treating Alyss Granato/Extender: [name] in Treatment: 6 Encounter Discharge  Information Items Discharge Condition: Stable Ambulatory Status: Ambulatory Discharge Destination: Home Transportation: Private Auto Accompanied By: self Schedule Follow-up Appointment: No Clinical Summary of Care: Electronic Signature(s) Signed: 12/02/2021 3:47:41 PM By: [name] CHT EMT BS , , Entered By: [name] on 12/02/2021 15:47:41 -------------------------------------------------------------------------------- [place] Details Patient Name: Date of Service: [name] [place], [name] 12/02/2021 1:00 PM Medical Record Number: [id number] Patient Account Number: [id number] Date of Birth/Sex: Treating RN: Jun 22, 1951 (70 y.o. [name] Primary Care Ladarius Seubert: [name] Other Clinician: [name] Referring Zeva Leber: Treating Wong Steadham/Extender: [name] Weeks in Treatment: 6 Vital Signs Time Taken: 13:10 Temperature (F): 98.1 Height (in): 68 Pulse (bpm): 90 Weight (lbs): 127 Respiratory Rate (breaths/min): 18 Body Mass Index (BMI): 19.3 Blood Pressure (mmHg): 108/54 Capillary Blood Glucose (mg/dl): 98 Reference Range: 80 - 120 mg / dl Airway Pulse Oximetry (%): 98 Electronic Signature(s) Signed: 12/02/2021 3:42:18 PM By: [name] CHT EMT BS , , Entered By: [name] on 12/02/2021 15:42:18

## 2021-12-02 NOTE — Progress Notes (Signed)
TRAVARUS, TRUDO ([id number]) Visit Report for 12/02/2021 SuperBill Details Patient Name: Date of Service: [place], [name] 12/02/2021 Medical Record Number: [id number] Patient Account Number: [id number] Date of Birth/Sex: Treating RN: [birth year] (71 y.o. 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, face and neck Facility Procedures The patient participates with Medicare or their insurance follows the Medicare Facility Guidelines CPT4 Code Description Modifier Quantity [id number] G0277-(Facility Use Only) HBOT full body chamber, 14min , 4 ICD-10 Diagnosis Description L59.8 Other specified disorders of the skin and subcutaneous tissue related to radiation 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

## 2021-12-02 NOTE — Progress Notes (Addendum)
TRINO, SCISM ([id number]) Visit Report for 12/02/2021 HBO Details Patient Name: Date of Service: [name], [name] 12/02/2021 1:00 PM Medical Record Number: [id number] Patient Account Number: [id number] Date of Birth/Sex: Treating RN: 11-10-1951 (70 y.o. 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 HBO Treatment Details Treatment Number: 9 Patient Type: Outpatient Chamber Type: Monoplace Chamber Serial #: [id number] Treatment Protocol: 2.0 ATA with 90 minutes oxygen, and no air breaks Treatment Details Compression Rate Down: 2.0 psi / minute De-Compression Rate Up: 2.0 psi / minute Air breaks and breathing Decompress Decompress Compress Tx Pressure Begins Reached periods Begins Ends (leave unused spaces blank) Chamber Pressure (ATA 1 2 ------2 1 ) Clock Time (24 hr) 13:17 13:25 - - - - - - 14:55 15:03 Treatment Length: 106 (minutes) Treatment Segments: 4 Vital Signs Capillary Blood Glucose Reference Range: 80 - 120 mg / dl HBO Diabetic Blood Glucose Intervention Range: <131 mg/dl or >782 mg/dl Time Vitals Blood Respiratory Capillary Blood Glucose Pulse Action Type: Pulse: Temperature: Taken: Pressure: Rate: Glucose (mg/dl): Meter #: Oximetry (%) Taken: Pre 13:10 108/54 90 18 98.1 98 98 Post 15:06 120/70 74 18 98.1 Treatment Response Treatment Toleration: Well Treatment Completion Status: Treatment Completed without Adverse Event Treatment Notes Physician informed of diastolic of 54 mmHg; per protocol (below 60 mmHg) Physician HBO Attestation: I certify that I supervised this HBO treatment in  accordance with Medicare guidelines. A trained emergency response team is readily available per Yes hospital policies and procedures. Continue HBOT as ordered. Yes Electronic Signature(s) Signed: 12/02/2021 5:44:46 PM By: Geralyn [name] DO Previous Signature: 12/02/2021 3:46:51 PM Version By: Haywood Pao CHT EMT BS , , Entered By: Geralyn [name] on 12/02/2021 17:44:30 -------------------------------------------------------------------------------- HBO Safety Checklist Details Patient Name: Date of Service: [name], [name] 12/02/2021 1:00 PM Medical Record Number: [id number] Patient Account Number: [id number] Date of Birth/Sex: Treating RN: September 01, 1951 (70 y.o. [name], [name] Primary Care Justo Hengel: [name] Other Clinician: Haywood Pao Referring Lanell Carpenter: Treating Eurika Sandy/Extender: Cristopher Peru Weeks in Treatment: 6 HBO Safety Checklist Items Safety Checklist Consent Form Signed Patient voided / foley secured and emptied When did you last eato 1130 Last dose of injectable or oral agent n/a Ostomy pouch emptied and vented if applicable NA All implantable devices assessed, documented and approved NA Intravenous access site secured and place NA Valuables secured Linens and cotton and cotton/polyester blend (less than 51% polyester) Personal oil-based products / skin lotions / body lotions removed Wigs or hairpieces removed NA Smoking or tobacco materials removed NA Books / newspapers / magazines / loose paper removed Cologne, aftershave, perfume and deodorant removed Jewelry removed (may wrap wedding band) Make-up removed Hair care products removed Battery operated devices (external) removed Heating patches and chemical warmers removed NA Titanium eyewear removed NA Not wearing eyewear in chamber (nickel) Nail polish cured greater than 10 hours NA Casting material cured greater than 10 hours NA Hearing aids  removed NA Loose dentures or partials removed NA Prosthetics have been removed NA Patient demonstrates correct use of air break device (if applicable) Patient concerns have been addressed Patient grounding bracelet on and cord attached to chamber Specifics for Inpatients (complete in addition to above) Medication sheet sent with patient NA Intravenous medications  needed or due during therapy sent with patient NA Drainage tubes (e.g. nasogastric tube or chest tube secured and vented) NA Endotracheal or Tracheotomy tube secured NA Cuff deflated of air and inflated with saline NA Airway suctioned NA Notes Paper version used prior to treatment. Electronic Signature(s) Signed: 12/02/2021 3:44:26 PM By: Haywood Pao CHT EMT BS , , Entered By: Haywood Pao on 12/02/2021 15:44:25

## 2021-12-02 NOTE — Progress Notes (Signed)
[name], [name] ([id number]) Visit Report for 11/29/2021 SuperBill Details Patient Name: Date of Service: [place], [name] 11/29/2021 Medical Record Number: [id number] Patient Account Number: [id number] Date of Birth/Sex: Treating RN: 05-Feb-1951 (70 y.o. 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, face and neck Facility Procedures The patient participates with Medicare or their insurance follows the Medicare Facility Guidelines CPT4 Code Description Modifier Quantity [id number] G0277-(Facility Use Only) HBOT full body chamber, 14min , 4 ICD-10 Diagnosis Description L59.8 Other specified disorders of the skin and subcutaneous tissue related to radiation 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

## 2021-12-03 ENCOUNTER — Encounter (HOSPITAL_BASED_OUTPATIENT_CLINIC_OR_DEPARTMENT_OTHER): Payer: Medicare Other | Admitting: Internal Medicine

## 2021-12-03 DIAGNOSIS — L598 Other specified disorders of the skin and subcutaneous tissue related to radiation: Secondary | ICD-10-CM | POA: Diagnosis not present

## 2021-12-03 NOTE — Progress Notes (Addendum)
[name], [name] ([id number]) Visit Report for 12/03/2021 Arrival Information Details Patient Name: Date of Service: [name], [name] 12/03/2021 1:00 PM Medical Record Number: [id number] Patient Account Number: [id number] Date of Birth/Sex: Treating RN: Oct 31, 1951 (70 y.o. [name] Primary Care Maximilliano Kersh: [name] Other Clinician: [name] Referring Lizbett Garciagarcia: Treating Amore Ackman/Extender: Tacey Ruiz in Treatment: 7 Visit Information History Since Last Visit All ordered tests and consults were completed: Yes Patient Arrived: Ambulatory Added or deleted any medications: No Arrival Time: 13:33 Any new allergies or adverse reactions: No Accompanied By: self Had a fall or experienced change in No Transfer Assistance: None activities of daily living that may affect Patient Identification Verified: Yes risk of falls: Secondary Verification Process Completed: Yes Signs or symptoms of abuse/neglect since last visito No Patient Requires Transmission-Based Precautions: No Hospitalized since last visit: No Patient Has Alerts: No Implantable device outside of the clinic excluding No cellular tissue based products placed in the center since last visit: Pain Present Now: No Electronic Signature(s) Signed: 12/03/2021 1:37:35 PM By: [name] CHT EMT BS , , Entered By: [name] on 12/03/2021 13:37:35 -------------------------------------------------------------------------------- Encounter Discharge Information Details Patient Name: Date of Service: [place], [name] 12/03/2021 1:00 PM Medical Record Number: [id number] Patient Account Number: [id number] Date of Birth/Sex: Treating RN: 11/16/1951 (70 y.o. [name] Primary Care Brody Bonneau: [name] Other Clinician: [name] Referring Ura Yingling: Treating Timberlyn Pickford/Extender: Tacey Ruiz in Treatment: 7 Encounter Discharge  Information Items Discharge Condition: Stable Ambulatory Status: Ambulatory Discharge Destination: Home Transportation: Private Auto Accompanied By: self Schedule Follow-up Appointment: No Clinical Summary of Care: Electronic Signature(s) Signed: 12/03/2021 3:32:03 PM By: [name] CHT EMT BS , , Entered By: [name] on 12/03/2021 15:32:03 -------------------------------------------------------------------------------- [place] Details Patient Name: Date of Service: [name], [name] 12/03/2021 1:00 PM Medical Record Number: [id number] Patient Account Number: [id number] Date of Birth/Sex: Treating RN: 19-Aug-1951 (70 y.o. [name] Primary Care Tarence Searcy: [name] Other Clinician: [name] Referring Addelynn Batte: Treating Stella Encarnacion/Extender: Tacey Ruiz in Treatment: 7 Vital Signs Time Taken: 13:11 Temperature (F): 98.2 Height (in): 68 Pulse (bpm): 79 Weight (lbs): 127 Respiratory Rate (breaths/min): 16 Body Mass Index (BMI): 19.3 Blood Pressure (mmHg): 107/49 Reference Range: 80 - 120 mg / dl Electronic Signature(s) Signed: 12/03/2021 1:38:15 PM By: [name] CHT EMT BS , , Entered By: [name] on 12/03/2021 13:38:15

## 2021-12-03 NOTE — Progress Notes (Addendum)
MURRIEL, BECKEN ([id number]) Visit Report for 12/03/2021 HBO Details Patient Name: Date of Service: [place], [place] 12/03/2021 1:00 PM Medical Record Number: [id number] Patient Account Number: [id number] Date of Birth/Sex: Treating RN: 1951/11/09 (70 y.o. 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 HBO Treatment Details Treatment Number: 10 Patient Type: Outpatient Chamber Type: Monoplace Chamber Serial #: [id number] Treatment Protocol: 2.0 ATA with 90 minutes oxygen, and no air breaks Treatment Details Compression Rate Down: 2.0 psi / minute De-Compression Rate Up: 2.0 psi / minute Air breaks and breathing Decompress Decompress Compress Tx Pressure Begins Reached periods Begins Ends (leave unused spaces blank) Chamber Pressure (ATA 1 2 ------2 1 ) Clock Time (24 hr) 13:24 13:34 - - - - - - 15:04 15:14 Treatment Length: 110 (minutes) Treatment Segments: 4 Vital Signs Capillary Blood Glucose Reference Range: 80 - 120 mg / dl HBO Diabetic Blood Glucose Intervention Range: <131 mg/dl or >782 mg/dl Time Vitals Blood Respiratory Capillary Blood Glucose Pulse Action Type: Pulse: Temperature: Taken: Pressure: Rate: Glucose (mg/dl): Meter #: Oximetry (%) Taken: Pre 13:11 107/49 79 16 98.2 Post 15:14 117/69 67 16 98.3 Treatment Response Treatment Toleration: Well Treatment Completion Status: Treatment Completed without Adverse Event Abad Manard Notes No concerns with treatment given Physician HBO Attestation: I certify that I supervised this HBO treatment in accordance with Medicare guidelines. A trained  emergency response team is readily available per Yes hospital policies and procedures. Continue HBOT as ordered. Yes Electronic Signature(s) Signed: 12/03/2021 4:30:32 PM By: [name] MD Previous Signature: 12/03/2021 3:29:41 PM Version By: Haywood Pao CHT EMT BS , , Entered By: [name] on 12/03/2021 16:29:21 -------------------------------------------------------------------------------- HBO Safety Checklist Details Patient Name: Date of Service: [place], [name] 12/03/2021 1:00 PM Medical Record Number: [id number] Patient Account Number: [id number] Date of Birth/Sex: Treating RN: 1951/02/01 (70 y.o. [name] Primary Care Adelaida Reindel: [name] Other Clinician: Haywood Pao Referring Lourdes Manning: Treating Amayiah Gosnell/Extender: [name] in Treatment: 7 HBO Safety Checklist Items Safety Checklist Consent Form Signed Patient voided / foley secured and emptied When did you last eato Bre Last dose of injectable or oral agent n/a Ostomy pouch emptied and vented if applicable NA All implantable devices assessed, documented and approved NA Intravenous access site secured and place NA Valuables secured Linens and cotton and cotton/polyester blend (less than 51% polyester) Personal oil-based products / skin lotions / body lotions removed Wigs or hairpieces removed NA Smoking or tobacco materials removed NA Books / newspapers / magazines / loose paper removed Cologne, aftershave, perfume and deodorant removed Jewelry removed (may wrap wedding band) Make-up removed NA Hair care products removed Battery operated devices (external) removed Heating patches and chemical warmers removed Titanium eyewear removed NA Not wearing eyewear in chamber (nickel) Nail polish cured greater than 10 hours NA Casting material cured greater than 10 hours NA Hearing aids removed NA Loose dentures or partials  removed NA Prosthetics have been removed NA Patient demonstrates correct use of air break device (if applicable) Patient concerns have been addressed Patient grounding bracelet on and cord attached to chamber Specifics for Inpatients (complete in addition to above) Medication sheet sent with patient NA Intravenous medications needed or due during therapy sent with patient NA  Drainage tubes (e.g. nasogastric tube or chest tube secured and vented) NA Endotracheal or Tracheotomy tube secured NA Cuff deflated of air and inflated with saline NA Airway suctioned NA Notes Paper version used prior to treatment. Electronic Signature(s) Signed: 12/03/2021 3:28:07 PM By: Haywood Pao CHT EMT BS , , Previous Signature: 12/03/2021 1:39:42 PM Version By: Haywood Pao CHT EMT BS , , Entered By: Haywood Pao on 12/03/2021 15:28:07

## 2021-12-03 NOTE — Progress Notes (Signed)
[name] Signed: 10/16/2021 8:21:05 AM By: [name] Ham MD Entered By: [name] on 10/15/2021 14:10:54 -------------------------------------------------------------------------------- SuperBill Details Patient Name: Date of Service: [name], [name] 10/15/2021 Medical Record Number: [id number] Patient Account Number: [id number] Date of Birth/Sex: Treating RN: 05/12/1951 (70 y.o. [name] Primary Care Provider: Marjorie Juarez Other Clinician: Referring  Provider: Treating Provider/Extender: [name], [name] in Treatment: 0 Diagnosis Coding Facility Procedures Electronic Signature(s) Signed: 10/15/2021 4:59:36 PM By: [name] Signed: 10/16/2021 8:21:05 AM By: [name] Ham MD Entered By: [name] on 10/15/2021 14:43:02  [name] Signed: 10/16/2021 8:21:05 AM By: [name] Ham MD Entered By: [name] on 10/15/2021 14:10:54 -------------------------------------------------------------------------------- SuperBill Details Patient Name: Date of Service: [name], [name] 10/15/2021 Medical Record Number: [id number] Patient Account Number: [id number] Date of Birth/Sex: Treating RN: 05/12/1951 (70 y.o. [name] Primary Care Provider: Marjorie Juarez Other Clinician: Referring  Provider: Treating Provider/Extender: [name], [name] in Treatment: 0 Diagnosis Coding Facility Procedures Electronic Signature(s) Signed: 10/15/2021 4:59:36 PM By: [name] Signed: 10/16/2021 8:21:05 AM By: [name] Ham MD Entered By: [name] on 10/15/2021 14:43:02  for HBO Therapy - Will submit to insurance Indication: - Tissue Radionecrosis If appropriate for treatment, begin HBOT per protocol: ordered were: EKG - Pre-HBO screening #1 the patient was referred here for consideration of hyperbaric oxygen therapy via [name] radiation oncology Dr. Adella Juarez or Dr.Yanagihara. He is indeed a candidate. 2 I spent time going over hyperbaric oxygen therapy including side effects. I also talked to him about potential benefit in a case like this. Problematic issues include fibrosis in the soft tissues of the neck is probably beyond hyperbaric treatment although I wonder about some of the other signs he has may be of potential benefit. 3. The patient had a CT scan of the chest on 07/30/2021 that showed a nodule in the left lower lobe but no consolidation pleural  effusion or pneumothorax. He does not have a history of coronary artery disease but he has not had a recent EKG which I will order. 4. Whether or not this patient will be able to clear his secretions in the hyperbaric chamber is a bit of a concern. Certainly we can elevate his head a bit and he can roll over. We showed the patient his chamber and he thought he could get through this so I think it is reasonable to try. He also will be traveling daily from [place] although he did not seem too concerned about this. 5. The patient has very limited range of motion of the neck, trismus, complete inability to swallow and pain in the left lateral neck. We will have to see if any of these symptoms improve. Unfortunately will take 3 to 4 weeks before we have any idea if that is to be the case I spent 45 minutes in review of this patient's past medical history, face-to-face evaluation and preparation of this record Electronic Signature(s) Signed: 10/16/2021 8:21:05 AM By: [name] Ham MD Entered By: [name] on 10/15/2021 15:25:25 -------------------------------------------------------------------------------- HxROS Details Patient Name: Date of Service: [name], [name] 10/15/2021 1:15 PM Medical Record Number: [id number] Patient Account Number: [id number] Date of Birth/Sex: Treating RN: 12-15-1951 (70 y.o. [name] Primary Care Provider: Marjorie Juarez Other Clinician: Referring Provider: Treating Provider/Extender: [name], [name] in Treatment: 0 Information Obtained From Patient Eyes Complaints and Symptoms: Positive for: Glasses / Contacts Respiratory Complaints and Symptoms: Negative for: Chronic or frequent coughs; Shortness of Breath Cardiovascular Complaints and Symptoms: Negative for: Chest pain Gastrointestinal Complaints and Symptoms: Negative for: Frequent diarrhea; Nausea; Vomiting Review of System Notes: G-tube feeding  only Endocrine Complaints and Symptoms: Negative for: Heat/cold intolerance Medical History: Past Medical History Notes: Hypothyroid Genitourinary Complaints and Symptoms: Negative for: Frequent urination Integumentary (Skin) Complaints and Symptoms: Negative for: Wounds Musculoskeletal Complaints and Symptoms: Negative for: Muscle Pain; Muscle Weakness Neurologic Complaints and Symptoms: Negative for: Numbness/parasthesias Psychiatric Complaints and Symptoms: Negative for: Claustrophobia; Suicidal Ear/Nose/Mouth/Throat Complaints and Symptoms: Review of System Notes: Hard of Hearing Medical History: Past Medical History Notes: Throat Cancer Hematologic/Lymphatic Immunological Oncologic Medical History: Positive for: Received Chemotherapy; Received Radiation Immunizations Pneumococcal Vaccine: Received Pneumococcal Vaccination: No Implantable Devices Yes Family and Social History Cancer: No; Diabetes: Yes - Father; Heart Disease: Yes - Paternal Grandparents; Hereditary Spherocytosis: No; Hypertension: No; Kidney Disease: No; Lung Disease: No; Seizures: No; Stroke: No; Thyroid Problems: No; Tuberculosis: No; Former smoker; Marital Status - Separated; Alcohol Use: Never; Drug Use: No History; Caffeine Use: Rarely; Financial Concerns: No; Food, Clothing or Shelter Needs: No; Support System Lacking: No; Transportation Concerns: No Electronic Signature(s) Signed: 10/15/2021 4:59:36 PM By:  [name], [name] ([id number]) Visit Report for 10/15/2021 Chief Complaint Document Details Patient Name: Date of Service: [place], [name] 10/15/2021 1:15 PM Medical Record Number: [id number] Patient Account Number: [id number] Date of Birth/Sex: Treating RN: Feb 15, 1951 (70 y.o. [name] Noe Primary Care Provider: Marjorie Juarez Other Clinician: Referring Provider: Treating Provider/Extender: [name], [name] in Treatment: 0 Information Obtained from: Patient Chief Complaint 10/15/2021; patient is here for consideration of hyperbaric oxygen therapy. Electronic Signature(s) Signed: 10/16/2021 8:21:05 AM By: [name] Ham MD Entered By: [name] on 10/15/2021 15:09:54 -------------------------------------------------------------------------------- HPI Details Patient Name: Date of Service: [name] [place], [name] 10/15/2021 1:15 PM Medical Record Number: [id number] Patient Account Number: [id number] Date of Birth/Sex: Treating RN: [birth year] (70 y.o. [name] Noe Primary Care Provider: Marjorie Juarez Other Clinician: Referring Provider: Treating Provider/Extender: [name], [name] in Treatment: 0 History of Present Illness HPI Description: ADMISSION 10/15/2021 Mr. 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 This was Ct3 cN2 HPV positive. He was treated with concomitant 5-FU and radiation6996 cGy over 33 treatments to his bilateral neck and left tonsil. I believe this was at [facility]. This was completed on 08/31/2018. Fortunately the patient from a cancer point of view did fairly well. CT scan and PET scans showed resolution of the tumor and metastatic nodes. As far as I am aware he is cancer free HOWEVER roughly a year after his treatment he started to develop tightness in the skin of his  left lateral neck predominantly. He states that this is gotten increasingly difficult over the last year and especially over the last several months. He has not been able to swallow and is PEG tube dependent. He has pain and tingling in his left lateral neck and occasionally radiating into the left shoulder and upper arm. The most difficult thing for this patient is his complete inability to swallow. He constantly coughs up thick mucousy sputum. He was followed with [facility] gastroenterology Dr. Gala Romney. 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 He has never regain swallowing function. He uses a PEG tube for feeding and for hydration. The patient was sent here by [facility] radiation oncology for consideration of hyperbaric oxygen therapy. 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 He also has pain as noted which he describes as an electrical sensation on the left side of his neck radiating into his arm. As noted he also has radiation changes in the hypopharynx and in the esophagus. Past medical history includes hypothyroidism, hypertension, hard of hearing, tonsillar CA, multiple dental extractions in 2019 Electronic Signature(s) Signed: 10/16/2021 8:21:05 AM By: [name] Ham MD Entered By: [name] on 10/15/2021 15:19:26 -------------------------------------------------------------------------------- Physical Exam Details Patient Name: Date of Service: [place], [name] 10/15/2021 1:15 PM Medical Record Number: [id number] Patient Account Number: [id number] Date of Birth/Sex: Treating RN: 09-Aug-1951 (70 y.o. [name] Noe Primary Care Provider: Marjorie Juarez Other Clinician: Referring Provider: Treating Provider/Extender: [name], [name] in Treatment:  0 Constitutional Patient is hypotensive. However he appears well. Pulse regular and within target range for patient.Marland Kitchen Respirations regular, non-labored and within target range.. Temperature is normal and within the target range for the patient.Marland Kitchen Appears in  for HBO Therapy - Will submit to insurance Indication: - Tissue Radionecrosis If appropriate for treatment, begin HBOT per protocol: ordered were: EKG - Pre-HBO screening #1 the patient was referred here for consideration of hyperbaric oxygen therapy via [name] radiation oncology Dr. Adella Juarez or Dr.Yanagihara. He is indeed a candidate. 2 I spent time going over hyperbaric oxygen therapy including side effects. I also talked to him about potential benefit in a case like this. Problematic issues include fibrosis in the soft tissues of the neck is probably beyond hyperbaric treatment although I wonder about some of the other signs he has may be of potential benefit. 3. The patient had a CT scan of the chest on 07/30/2021 that showed a nodule in the left lower lobe but no consolidation pleural  effusion or pneumothorax. He does not have a history of coronary artery disease but he has not had a recent EKG which I will order. 4. Whether or not this patient will be able to clear his secretions in the hyperbaric chamber is a bit of a concern. Certainly we can elevate his head a bit and he can roll over. We showed the patient his chamber and he thought he could get through this so I think it is reasonable to try. He also will be traveling daily from [place] although he did not seem too concerned about this. 5. The patient has very limited range of motion of the neck, trismus, complete inability to swallow and pain in the left lateral neck. We will have to see if any of these symptoms improve. Unfortunately will take 3 to 4 weeks before we have any idea if that is to be the case I spent 45 minutes in review of this patient's past medical history, face-to-face evaluation and preparation of this record Electronic Signature(s) Signed: 10/16/2021 8:21:05 AM By: [name] Ham MD Entered By: [name] on 10/15/2021 15:25:25 -------------------------------------------------------------------------------- HxROS Details Patient Name: Date of Service: [name] [place], [name] 10/15/2021 1:15 PM Medical Record Number: [id number] Patient Account Number: [id number] Date of Birth/Sex: Treating RN: 12-15-1951 (70 y.o. [name] Primary Care Provider: Marjorie Juarez Other Clinician: Referring Provider: Treating Provider/Extender: [name], [name] in Treatment: 0 Information Obtained From Patient Eyes Complaints and Symptoms: Positive for: Glasses / Contacts Respiratory Complaints and Symptoms: Negative for: Chronic or frequent coughs; Shortness of Breath Cardiovascular Complaints and Symptoms: Negative for: Chest pain Gastrointestinal Complaints and Symptoms: Negative for: Frequent diarrhea; Nausea; Vomiting Review of System Notes: G-tube feeding  only Endocrine Complaints and Symptoms: Negative for: Heat/cold intolerance Medical History: Past Medical History Notes: Hypothyroid Genitourinary Complaints and Symptoms: Negative for: Frequent urination Integumentary (Skin) Complaints and Symptoms: Negative for: Wounds Musculoskeletal Complaints and Symptoms: Negative for: Muscle Pain; Muscle Weakness Neurologic Complaints and Symptoms: Negative for: Numbness/parasthesias Psychiatric Complaints and Symptoms: Negative for: Claustrophobia; Suicidal Ear/Nose/Mouth/Throat Complaints and Symptoms: Review of System Notes: Hard of Hearing Medical History: Past Medical History Notes: Throat Cancer Hematologic/Lymphatic Immunological Oncologic Medical History: Positive for: Received Chemotherapy; Received Radiation Immunizations Pneumococcal Vaccine: Received Pneumococcal Vaccination: No Implantable Devices Yes Family and Social History Cancer: No; Diabetes: Yes - Father; Heart Disease: Yes - Paternal Grandparents; Hereditary Spherocytosis: No; Hypertension: No; Kidney Disease: No; Lung Disease: No; Seizures: No; Stroke: No; Thyroid Problems: No; Tuberculosis: No; Former smoker; Marital Status - Separated; Alcohol Use: Never; Drug Use: No History; Caffeine Use: Rarely; Financial Concerns: No; Food, Clothing or Shelter Needs: No; Support System Lacking: No; Transportation Concerns: No Electronic Signature(s) Signed: 10/15/2021 4:59:36 PM By:

## 2021-12-03 NOTE — Progress Notes (Signed)
CORNELIUS, MARULLO ([id number]) Visit Report for 12/03/2021 SuperBill Details Patient Name: Date of Service: [place], [name] 12/03/2021 Medical Record Number: [id number] Patient Account Number: [id number] Date of Birth/Sex: Treating RN: 24-Feb-1951 (70 y.o. [name] Primary Care Provider: [name] Other Clinician: [name] Referring Provider: Treating Provider/Extender: [name], [name] in Treatment: 7 Diagnosis Coding ICD-10 Codes Code Description L59.8 Other specified disorders of the skin and subcutaneous tissue related to radiation C49.0 Malignant neoplasm of connective and soft tissue of head, face and neck Facility Procedures The patient participates with Medicare or their insurance follows the Medicare Facility Guidelines CPT4 Code Description Modifier Quantity [id number] G0277-(Facility Use Only) HBOT full body chamber, 83min , 4 ICD-10 Diagnosis Description L59.8 Other specified disorders of the skin and subcutaneous tissue related to radiation C49.0 Malignant neoplasm of connective and soft tissue of head, face and neck Physician Procedures Quantity CPT4 Code Description Modifier [id number] - WC PHYS HYPERBARIC OXYGEN THERAPY 1 ICD-10 Diagnosis Description L59.8 Other specified disorders of the skin and subcutaneous tissue related to radiation C49.0 Malignant neoplasm of connective and soft tissue of head, face and neck Electronic Signature(s) Signed: 12/03/2021 3:30:37 PM By: [name] CHT EMT BS , , Signed: 12/03/2021 4:30:32 PM By: [name] MD Entered By: [name] on 12/03/2021 15:30:36

## 2021-12-04 ENCOUNTER — Other Ambulatory Visit: Payer: Self-pay

## 2021-12-04 ENCOUNTER — Encounter (HOSPITAL_BASED_OUTPATIENT_CLINIC_OR_DEPARTMENT_OTHER): Payer: Medicare Other | Admitting: Physician Assistant

## 2021-12-04 DIAGNOSIS — L598 Other specified disorders of the skin and subcutaneous tissue related to radiation: Secondary | ICD-10-CM | POA: Diagnosis not present

## 2021-12-04 NOTE — Progress Notes (Signed)
BARD, HAUPERT ([id number]) Visit Report for 12/04/2021 SuperBill Details Patient Name: Date of Service: [place], [name] 12/04/2021 Medical Record Number: [id number] Patient Account Number: [id number] Date of Birth/Sex: Treating RN: [birth year] (70 y.o. [name] Primary Care Provider: [name] Other Clinician: [name] Referring Provider: Treating Provider/Extender: Alinda Dooms, Tiffany Weeks in Treatment: 7 Diagnosis Coding ICD-10 Codes Code Description L59.8 Other specified disorders of the skin and subcutaneous tissue related to radiation C49.0 Malignant neoplasm of connective and soft tissue of head, face and neck Facility Procedures The patient participates with Medicare or their insurance follows the Medicare Facility Guidelines CPT4 Code Description Modifier Quantity [id number] G0277-(Facility Use Only) HBOT full body chamber, 31min , 2 ICD-10 Diagnosis Description L59.8 Other specified disorders of the skin and subcutaneous tissue related to radiation C49.0 Malignant neoplasm of connective and soft tissue of head, face and neck Physician Procedures Quantity CPT4 Code Description Modifier [id number] - WC PHYS HYPERBARIC OXYGEN THERAPY 1 ICD-10 Diagnosis Description L59.8 Other specified disorders of the skin and subcutaneous tissue related to radiation C49.0 Malignant neoplasm of connective and soft tissue of head, face and neck Electronic Signature(s) Signed: 12/04/2021 3:53:54 PM By: [name] CHT EMT BS , , Signed: 12/04/2021 3:56:44 PM By: Worthy Keeler PA-C Entered By: [name] on 12/04/2021 15:53:54

## 2021-12-04 NOTE — Progress Notes (Addendum)
[name], [name] ([id number]) Visit Report for 12/04/2021 Arrival Information Details Patient Name: Date of Service: [name] [place]. 12/04/2021 1:00 PM Medical Record Number: [id number] Patient Account Number: [id number] Date of Birth/Sex: Treating RN: [birth year] (70 y.o. [name] Primary Care Nery Frappier: [name] Other Clinician: [name] Referring Micholas Drumwright: Treating Yaneisy Wenz/Extender: Alinda Dooms, Tiffany Weeks in Treatment: 7 Visit Information History Since Last Visit All ordered tests and consults were completed: Yes Patient Arrived: Ambulatory Added or deleted any medications: No Arrival Time: 12:18 Any new allergies or adverse reactions: No Accompanied By: self Had a fall or experienced change in No Transfer Assistance: None activities of daily living that may affect Patient Identification Verified: Yes risk of falls: Secondary Verification Process Completed: Yes Signs or symptoms of abuse/neglect since last visito No Patient Requires Transmission-Based Precautions: No Hospitalized since last visit: No Patient Has Alerts: No Implantable device outside of the clinic excluding No cellular tissue based products placed in the center since last visit: Pain Present Now: No Electronic Signature(s) Signed: 12/04/2021 1:03:56 PM By: [name] CHT EMT BS , , Entered By: [name] on 12/04/2021 13:03:55 -------------------------------------------------------------------------------- Encounter Discharge Information Details Patient Name: Date of Service: [name] [place]ango[name] 12/04/2021 1:00 PM Medical Record Number: [id number] Patient Account Number: [id number] Date of Birth/Sex: Treating RN: 1951-07-02 (70 y.o. [name] Primary Care Alyrica Thurow: [name] Other Clinician: [name] Referring Crandall Harvel: Treating Devontae Casasola/Extender: Alinda Dooms, Tiffany Weeks in Treatment: 7 Encounter Discharge  Information Items Discharge Condition: Stable Ambulatory Status: Ambulatory Discharge Destination: Home Transportation: Private Auto Accompanied By: self Schedule Follow-up Appointment: No Clinical Summary of Care: Electronic Signature(s) Signed: 12/04/2021 3:54:24 PM By: [name] CHT EMT BS , , Entered By: [name] on 12/04/2021 15:54:24 -------------------------------------------------------------------------------- Vitals Details Patient Name: Date of Service: [name] L[place], [name] 12/04/2021 1:00 PM Medical Record Number: [id number] Patient Account Number: [id number] Date of Birth/Sex: Treating RN: Nov 27, 1951 (70 y.o. [name] Primary Care [name]: [name] Other Clinician: [name] Referring Thorin Starner: Treating Sheila Ocasio/Extender: Alinda Dooms, Tiffany Weeks in Treatment: 7 Vital Signs Time Taken: 12:40 Temperature (F): 98.5 Height (in): 68 Pulse (bpm): 79 Weight (lbs): 127 Respiratory Rate (breaths/min): 18 Body Mass Index (BMI): 19.3 Blood Pressure (mmHg): 115/61 Reference Range: 80 - 120 mg / dl Electronic Signature(s) Signed: 12/04/2021 1:05:37 PM By: [name] CHT EMT BS , , Entered By: [name] on 12/04/2021 13:05:37

## 2021-12-04 NOTE — Progress Notes (Addendum)
ODINN, BEVIER ([id number]) Visit Report for 12/04/2021 HBO Details Patient Name: Date of Service: [name], [name]. 12/04/2021 1:00 PM Medical Record Number: [id number] Patient Account Number: [id number] Date of Birth/Sex: Treating RN: January 20, 1951 (70 y.o. 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 HBO Treatment Details Treatment Number: 11 Patient Type: Outpatient Chamber Type: Monoplace Chamber Serial #: [id number] Treatment Protocol: 2.0 ATA with 90 minutes oxygen, and no air breaks Treatment Details Compression Rate Down: 2.0 psi / minute De-Compression Rate Up: 2.0 psi / minute Air breaks and breathing Decompress Decompress Compress Tx Pressure Begins Reached periods Begins Ends (leave unused spaces blank) Chamber Pressure (ATA 1 2 ------2 1 ) Clock Time (24 hr) 12:48 12:57 - - - - - - 14:27 13:35 Treatment Length: 47 (minutes) Treatment Segments: 2 Vital Signs Capillary Blood Glucose Reference Range: 80 - 120 mg / dl HBO Diabetic Blood Glucose Intervention Range: <131 mg/dl or >295 mg/dl Time Vitals Blood Respiratory Capillary Blood Glucose Pulse Action Type: Pulse: Temperature: Taken: Pressure: Rate: Glucose (mg/dl): Meter #: Oximetry (%) Taken: Pre 12:40 115/61 79 18 98.5 Post 14:38 111/69 64 18 98.3 Treatment Response Treatment Toleration: Well Treatment Completion Status: Treatment Completed without Adverse Event Electronic Signature(s) Signed: 12/04/2021 3:53:36 PM By: Haywood Pao CHT EMT BS , , Signed: 12/04/2021 3:56:44 PM By: Lenda Kelp PA-C Entered By: Haywood Pao  on 12/04/2021 15:53:36 -------------------------------------------------------------------------------- HBO Safety Checklist Details Patient Name: Date of Service: [name], [name] [name] 12/04/2021 1:00 PM Medical Record Number: [id number] Patient Account Number: [id number] Date of Birth/Sex: Treating RN: [birth year] (69 y.o. [name] Primary Care Zhaniya Swallows: [name] Other Clinician: Haywood Pao Referring Jadis Mika: Treating Nader Boys/Extender: Roberts Gaudy, Tiffany Weeks in Treatment: 7 HBO Safety Checklist Items Safety Checklist Consent Form Signed Patient voided / foley secured and emptied When did you last eato 0730 Last dose of injectable or oral agent n/a Ostomy pouch emptied and vented if applicable NA All implantable devices assessed, documented and approved NA Intravenous access site secured and place NA Valuables secured Linens and cotton and cotton/polyester blend (less than 51% polyester) Personal oil-based products / skin lotions / body lotions removed Wigs or hairpieces removed NA Smoking or tobacco materials removed NA Books / newspapers / magazines / loose paper removed Cologne, aftershave, perfume and deodorant removed Jewelry removed (may wrap wedding band) Make-up removed NA Hair care products removed Battery operated devices (external) removed Heating patches and chemical warmers removed Titanium eyewear removed NA Not wearing eyewear in chamber (nickel) Nail polish cured greater than 10 hours NA Casting material cured greater than 10 hours NA Hearing aids removed NA Loose dentures or partials removed NA Prosthetics have been removed NA Patient demonstrates correct use of air break device (if applicable) Patient concerns have been addressed Patient grounding bracelet on and cord attached to chamber Specifics for Inpatients (complete in addition to above) Medication sheet sent with patient NA Intravenous medications  needed or due during therapy sent with patient NA Drainage tubes (e.g. nasogastric tube or chest tube secured and vented) NA Endotracheal or Tracheotomy tube secured NA Cuff deflated of air and inflated with saline NA Airway suctioned NA Notes Paper Version used prior to treatment. Electronic Signature(s) Signed: 12/04/2021  3:52:18 PM By: Haywood Pao CHT EMT BS , , Previous Signature: 12/04/2021 1:07:10 PM Version By: Haywood Pao CHT EMT BS , , Entered By: Haywood Pao on 12/04/2021 15:52:17

## 2021-12-05 ENCOUNTER — Encounter (HOSPITAL_BASED_OUTPATIENT_CLINIC_OR_DEPARTMENT_OTHER): Payer: Medicare Other | Admitting: Internal Medicine

## 2021-12-05 DIAGNOSIS — L598 Other specified disorders of the skin and subcutaneous tissue related to radiation: Secondary | ICD-10-CM | POA: Diagnosis not present

## 2021-12-05 NOTE — Progress Notes (Signed)
ROLAND, LIPKE ([id number]) Visit Report for 12/05/2021 SuperBill Details Patient Name: Date of Service: [place], [name] 12/05/2021 Medical Record Number: [id number] Patient Account Number: [id number] Date of Birth/Sex: Treating RN: [birth year] (70 y.o. 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, face and neck Facility Procedures The patient participates with Medicare or their insurance follows the Medicare Facility Guidelines CPT4 Code Description Modifier Quantity [id number] G0277-(Facility Use Only) HBOT full body chamber, 51min , 4 ICD-10 Diagnosis Description L59.8 Other specified disorders of the skin and subcutaneous tissue related to radiation 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

## 2021-12-05 NOTE — Progress Notes (Addendum)
MATTIX, IMHOF ([id number]) Visit Report for 12/05/2021 Arrival Information Details Patient Name: Date of Service: [name], [name] 12/05/2021 1:00 PM Medical Record Number: [id number] Patient Account Number: [id number] Date of Birth/Sex: Treating RN: 1951/10/01 (70 y.o. [name], Meta.Reding Primary Care Rykker Coviello: [name] Other Clinician: [name] Referring Sonoma Firkus: Treating Dalante Minus/Extender: Tacey Ruiz in Treatment: 7 Visit Information History Since Last Visit All ordered tests and consults were completed: Yes Patient Arrived: Ambulatory Added or deleted any medications: No Arrival Time: 12:34 Any new allergies or adverse reactions: No Accompanied By: self Had a fall or experienced change in No Transfer Assistance: None activities of daily living that may affect Patient Identification Verified: Yes risk of falls: Secondary Verification Process Completed: Yes Signs or symptoms of abuse/neglect since last visito No Patient Requires Transmission-Based Precautions: No Hospitalized since last visit: No Patient Has Alerts: No Implantable device outside of the clinic excluding No cellular tissue based products placed in the center since last visit: Pain Present Now: No Electronic Signature(s) Signed: 12/05/2021 1:59:14 PM By: [name] CHT EMT BS , , Entered By: [name] on 12/05/2021 13:59:14 -------------------------------------------------------------------------------- Encounter Discharge Information Details Patient Name: Date of Service: [name] Lango, [name] 12/05/2021 1:00 PM Medical Record Number: [id number] Patient Account Number: [id number] Date of Birth/Sex: Treating RN: 1951/08/30 (70 y.o. [name] Primary Care Sirron Francesconi: [name] Other Clinician: [name] Referring Celestial Barnfield: Treating Miciah Shealy/Extender: Tacey Ruiz in Treatment: 7 Encounter Discharge  Information Items Discharge Condition: Stable Ambulatory Status: Ambulatory Discharge Destination: Home Transportation: Private Auto Accompanied By: self Schedule Follow-up Appointment: No Clinical Summary of Care: Electronic Signature(s) Signed: 12/05/2021 3:37:13 PM By: [name] CHT EMT BS , , Entered By: [name] on 12/05/2021 15:37:13 -------------------------------------------------------------------------------- [place] Details Patient Name: Date of Service: [name] Lango, [name] 12/05/2021 1:00 PM Medical Record Number: [id number] Patient Account Number: [id number] Date of Birth/Sex: Treating RN: 04/21/1951 (70 y.o. [name] Primary Care Myishia Kasik: [name] Other Clinician: [name] Referring Adanna Zuckerman: Treating Daveigh Batty/Extender: Tacey Ruiz in Treatment: 7 Vital Signs Time Taken: 12:45 Temperature (F): 98.4 Height (in): 68 Pulse (bpm): 80 Weight (lbs): 127 Respiratory Rate (breaths/min): 18 Body Mass Index (BMI): 19.3 Blood Pressure (mmHg): 100/50 Reference Range: 80 - 120 mg / dl Airway Pulse Oximetry (%): 100 Electronic Signature(s) Signed: 12/05/2021 2:00:09 PM By: [name] CHT EMT BS , , Entered By: [name] on 12/05/2021 14:00:09

## 2021-12-05 NOTE — Progress Notes (Addendum)
MITSURU, RAUSA ([id number]) Visit Report for 12/05/2021 HBO Details Patient Name: Date of Service: [place],  12/05/2021 1:00 PM Medical Record Number: [id number] Patient Account Number: [id number] Date of Birth/Sex: Treating RN: 26-Mar-1951 (70 y.o. 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 HBO Treatment Details Treatment Number: 12 Patient Type: Outpatient Chamber Type: Monoplace Chamber Serial #: [id number] Treatment Protocol: 2.0 ATA with 90 minutes oxygen, and no air breaks Treatment Details Compression Rate Down: 2.0 psi / minute De-Compression Rate Up: 2.0 psi / minute Air breaks and breathing Decompress Decompress Compress Tx Pressure Begins Reached periods Begins Ends (leave unused spaces blank) Chamber Pressure (ATA 1 2 ------2 1 ) Clock Time (24 hr) 13:05 13:13 - - - - - - 14:43 14:51 Treatment Length: 106 (minutes) Treatment Segments: 4 Vital Signs Capillary Blood Glucose Reference Range: 80 - 120 mg / dl HBO Diabetic Blood Glucose Intervention Range: <131 mg/dl or >564 mg/dl Time Vitals Blood Respiratory Capillary Blood Glucose Pulse Action Type: Pulse: Temperature: Taken: Pressure: Rate: Glucose (mg/dl): Meter #: Oximetry (%) Taken: Pre 12:45 100/50 80 18 98.4 100 Post 14:54 116/64 63 18 98.2 Treatment Response Treatment Toleration: Well Treatment Completion Status: Treatment Completed without Adverse Event Darl Kuss Notes No concerns with treatment given Physician HBO Attestation: I certify that I supervised this HBO treatment in accordance with Medicare guidelines. A trained  emergency response team is readily available per Yes hospital policies and procedures. Continue HBOT as ordered. Yes Electronic Signature(s) Signed: 12/05/2021 4:39:12 PM By: [name] MD Previous Signature: 12/05/2021 3:36:38 PM Version By: Haywood Pao CHT EMT BS , , Entered By: [name] on 12/05/2021 16:02:35 -------------------------------------------------------------------------------- HBO Safety Checklist Details Patient Name: Date of Service: [place], [name] 12/05/2021 1:00 PM Medical Record Number: [id number] Patient Account Number: [id number] Date of Birth/Sex: Treating RN: 12-22-1951 (70 y.o. [name], [name] Primary Care Wilmarie Sparlin: [name] Other Clinician: Haywood Pao Referring Denarius Sesler: Treating Aquila Menzie/Extender: [name] in Treatment: 7 HBO Safety Checklist Items Safety Checklist Consent Form Signed Patient voided / foley secured and emptied When did you last eato 1130 Last dose of injectable or oral agent n/a Ostomy pouch emptied and vented if applicable NA All implantable devices assessed, documented and approved NA Intravenous access site secured and place NA Valuables secured Linens and cotton and cotton/polyester blend (less than 51% polyester) Personal oil-based products / skin lotions / body lotions removed Wigs or hairpieces removed NA Smoking or tobacco materials removed NA Books / newspapers / magazines / loose paper removed Cologne, aftershave, perfume and deodorant removed Jewelry removed (may wrap wedding band) Make-up removed NA Hair care products removed Battery operated devices (external) removed Heating patches and chemical warmers removed Titanium eyewear removed NA Not wearing eyewear in chamber (nickel) Nail polish cured greater than 10 hours NA Casting material cured greater than 10 hours NA Hearing aids removed NA Loose dentures or partials  removed NA Prosthetics have been removed NA Patient demonstrates correct use of air break device (if applicable) Patient concerns have been addressed Patient grounding bracelet on and cord attached to chamber Specifics for Inpatients (complete in addition to above) Medication sheet sent with patient NA Intravenous medications needed or due during therapy sent with patient  NA Drainage tubes (e.g. nasogastric tube or chest tube secured and vented) NA Endotracheal or Tracheotomy tube secured NA Cuff deflated of air and inflated with saline NA Airway suctioned NA Notes Paper version used prior to treatment. Electronic Signature(s) Signed: 12/05/2021 2:07:19 PM By: Haywood Pao CHT EMT BS , , Entered By: Haywood Pao on 12/05/2021 14:07:18

## 2021-12-06 ENCOUNTER — Encounter (HOSPITAL_BASED_OUTPATIENT_CLINIC_OR_DEPARTMENT_OTHER): Payer: Medicare Other | Admitting: Internal Medicine

## 2021-12-06 ENCOUNTER — Other Ambulatory Visit: Payer: Self-pay

## 2021-12-06 DIAGNOSIS — L598 Other specified disorders of the skin and subcutaneous tissue related to radiation: Secondary | ICD-10-CM | POA: Diagnosis not present

## 2021-12-06 DIAGNOSIS — C490 Malignant neoplasm of connective and soft tissue of head, face and neck: Secondary | ICD-10-CM

## 2021-12-06 NOTE — Progress Notes (Addendum)
[name], Juarez ([id number]) Visit Report for 12/06/2021 HBO Details Patient Name: Date of Service: [place], [name]. 12/06/2021 10:00 A M Medical Record Number: [id number] Patient Account Number: [id number] Date of Birth/Sex: Treating RN: July 27, 1951 (70 y.o. 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 HBO Treatment Details Treatment Number: 13 Patient Type: Outpatient Chamber Type: Monoplace Chamber Serial #: [id number] Treatment Protocol: 2.0 ATA with 90 minutes oxygen, and no air breaks Treatment Details Compression Rate Down: 2.0 psi / minute De-Compression Rate Up: 2.0 psi / minute Air breaks and breathing Decompress Decompress Compress Tx Pressure Begins Reached periods Begins Ends (leave unused spaces blank) Chamber Pressure (ATA 1 2 ------2 1 ) Clock Time (24 hr) 10:34 10:42 - - - - - - 12:12 12:20 Treatment Length: 106 (minutes) Treatment Segments: 4 Vital Signs Capillary Blood Glucose Reference Range: 80 - 120 mg / dl HBO Diabetic Blood Glucose Intervention Range: <131 mg/dl or >595 mg/dl Time Vitals Blood Respiratory Capillary Blood Glucose Pulse Action Type: Pulse: Temperature: Taken: Pressure: Rate: Glucose (mg/dl): Meter #: Oximetry (%) Taken: Pre 10:00 98/40 98 16 98 99 Post 12:24 130/51 74 16 98.2 100 Treatment Response Treatment Toleration: Well Treatment Completion Status: Treatment Completed without Adverse Event Treatment Notes Patient reported asymptomatic for hypotension. Electronic Signature(s) Signed: 12/09/2021 2:19:08 PM By: Haywood Pao CHT EMT BS , , Signed:  12/09/2021 2:29:00 PM By: Geralyn [name] DO Entered By: Haywood Pao on 12/09/2021 14:19:07 -------------------------------------------------------------------------------- HBO Safety Checklist Details Patient Name: Date of Service: [place], [name] 12/06/2021 10:00 A M Medical Record Number: [id number] Patient Account Number: [id number] Date of Birth/Sex: Treating RN: 12/06/1951 (70 y.o. [name] Primary Care Marv Alfrey: [name] Other Clinician: Haywood Pao Referring Keo Schirmer: Treating Halvor Behrend/Extender: Marena Chancy in Treatment: 7 HBO Safety Checklist Items Safety Checklist Consent Form Signed Patient voided / foley secured and emptied When did you last eato 0800 Last dose of injectable or oral agent n/a Ostomy pouch emptied and vented if applicable NA All implantable devices assessed, documented and approved NA Intravenous access site secured and place NA Valuables secured Linens and cotton and cotton/polyester blend (less than 51% polyester) Personal oil-based products / skin lotions / body lotions removed Wigs or hairpieces removed NA Smoking or tobacco materials removed NA Books / newspapers / magazines / loose paper removed Cologne, aftershave, perfume and deodorant removed Jewelry removed (may wrap wedding band) Make-up removed NA Hair care products removed Battery operated devices (external) removed Heating patches and chemical warmers removed Titanium eyewear removed NA Not wearing eyewear in chamber (nickel) Nail polish cured greater than 10 hours NA Casting material cured greater than 10 hours NA Hearing aids removed NA Loose dentures or partials removed NA Prosthetics have been removed NA Patient demonstrates correct use of air break device (if applicable) Patient concerns have been addressed Patient grounding bracelet on and cord attached to chamber Specifics for Inpatients (complete in  addition to above) Medication sheet sent with patient NA Intravenous medications needed or due during therapy sent with patient NA Drainage tubes (e.g. nasogastric tube or chest tube secured and vented) NA Endotracheal or Tracheotomy tube secured NA Cuff deflated of air and inflated with saline NA Airway suctioned NA Notes Paper version  used prior to treatment. Electronic Signature(s) Signed: 12/06/2021 12:30:54 PM By: Haywood Pao CHT EMT BS , , Previous Signature: 12/06/2021 11:28:18 AM Version By: Haywood Pao CHT EMT BS , , Entered By: Haywood Pao on 12/06/2021 12:30:53

## 2021-12-09 ENCOUNTER — Encounter (HOSPITAL_BASED_OUTPATIENT_CLINIC_OR_DEPARTMENT_OTHER): Payer: Medicare Other | Admitting: Internal Medicine

## 2021-12-09 ENCOUNTER — Other Ambulatory Visit: Payer: Self-pay

## 2021-12-09 DIAGNOSIS — C490 Malignant neoplasm of connective and soft tissue of head, face and neck: Secondary | ICD-10-CM | POA: Diagnosis not present

## 2021-12-09 DIAGNOSIS — L598 Other specified disorders of the skin and subcutaneous tissue related to radiation: Secondary | ICD-10-CM | POA: Diagnosis not present

## 2021-12-09 NOTE — Progress Notes (Signed)
[name], [name] ([id number]) Visit Report for 12/09/2021 SuperBill Details Patient Name: Date of Service: [place], [name] 12/09/2021 Medical Record Number: [id number] Patient Account Number: [id number] Date of Birth/Sex: Treating RN: 19-Aug-1951 (70 y.o. 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, face and neck Facility Procedures The patient participates with Medicare or their insurance follows the Medicare Facility Guidelines CPT4 Code Description Modifier Quantity [id number] G0277-(Facility Use Only) HBOT full body chamber, 45min , 4 ICD-10 Diagnosis Description L59.8 Other specified disorders of the skin and subcutaneous tissue related to radiation 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

## 2021-12-09 NOTE — Progress Notes (Signed)
[name], [name] ([id number]) Visit Report for 12/09/2021 Arrival Information Details Patient Name: Date of Service: [name], [name] [name]. 12/09/2021 1:00 PM Medical Record Number: [id number] Patient Account Number: [id number] Date of Birth/Sex: Treating RN: Aug 04, 1951 (70 y.o. [name] Primary Care [name]: [name] Other Clinician: Donavan Juarez Referring [name]: Treating [name]/Extender: [name] in Treatment: 7 Visit Information History Since Last Visit All ordered tests and consults were completed: Yes Patient Arrived: Ambulatory Added or deleted any medications: No Arrival Time: 12:38 Any [name] allergies or adverse reactions: No Accompanied By: self Had a fall or experienced change in No Transfer Assistance: None activities of daily living that may affect Patient Identification Verified: Yes risk of falls: Secondary Verification Process Completed: Yes Signs or symptoms of abuse/neglect since last visito No Patient Requires Transmission-Based Precautions: No Hospitalized since last visit: No Patient Has Alerts: No Implantable device outside of the clinic excluding No cellular tissue based products placed in the center since last visit: Pain Present Now: No Electronic Signature(s) Signed: 12/09/2021 2:24:18 PM By: [name] CHT EMT BS , , Entered By: [name] on 12/09/2021 14:24:17 -------------------------------------------------------------------------------- Encounter Discharge Information Details Patient Name: Date of Service: [name] [place], [name] 12/09/2021 1:00 PM Medical Record Number: [id number] Patient Account Number: [id number] Date of Birth/Sex: Treating RN: 1951-11-19 (70 y.o. [name] Primary Care [name]: [name] Other Clinician: Donavan Juarez Referring [name]: Treating [name]/Extender: [name] in Treatment: 7 Encounter Discharge  Information Items Discharge Condition: Stable Ambulatory Status: Ambulatory Discharge Destination: Home Transportation: Private Auto Accompanied By: self Schedule Follow-up Appointment: No Clinical Summary of Care: Electronic Signature(s) Signed: 12/09/2021 4:10:10 PM By: [name] CHT EMT BS , , Entered By: [name] on 12/09/2021 16:10:10 -------------------------------------------------------------------------------- Vitals Details Patient Name: Date of Service: [name] [place], [name] 12/09/2021 1:00 PM Medical Record Number: [id number] Patient Account Number: [id number] Date of Birth/Sex: Treating RN: February 22, 1951 (70 y.o. [name] Primary Care [name]: [name] Other Clinician: Donavan Juarez Referring [name]: Treating [name]/Extender: [name] in Treatment: 7 Vital Signs Time Taken: 12:39 Temperature (F): 98.3 Height (in): 68 Pulse (bpm): 78 Weight (lbs): 127 Respiratory Rate (breaths/min): 16 Body Mass Index (BMI): 19.3 Blood Pressure (mmHg): 109/57 Reference Range: 80 - 120 mg / dl Notes Alerted MD to patient's BP. Patient asymptomatic for hypotension. Electronic Signature(s) Signed: 12/09/2021 2:25:38 PM By: [name] CHT EMT BS , , Entered By: [name] on 12/09/2021 14:25:37

## 2021-12-09 NOTE — Progress Notes (Addendum)
[name] ([id number]) Visit Report for 12/09/2021 HBO Details Patient Name: Date of Service: [name], [name] 12/09/2021 1:00 PM Medical Record Number: [id number] Patient Account Number: [id number] Date of Birth/Sex: Treating RN: 1951-03-13 (70 y.o. 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 HBO Treatment Details Treatment Number: 14 Patient Type: Outpatient Chamber Type: Monoplace Chamber Serial #: [id number] Treatment Protocol: 2.0 ATA with 90 minutes oxygen, and no air breaks Treatment Details Compression Rate Down: 2.0 psi / minute De-Compression Rate Up: 2.0 psi / minute Air breaks and breathing Decompress Decompress Compress Tx Pressure Begins Reached periods Begins Ends (leave unused spaces blank) Chamber Pressure (ATA 1 2 ------2 1 ) Clock Time (24 hr) 13:02 13:10 - - - - - - 14:40 14:48 Treatment Length: 106 (minutes) Treatment Segments: 4 Vital Signs Capillary Blood Glucose Reference Range: 80 - 120 mg / dl HBO Diabetic Blood Glucose Intervention Range: <131 mg/dl or >086 mg/dl Time Vitals Blood Respiratory Capillary Blood Glucose Pulse Action Type: Pulse: Temperature: Taken: Pressure: Rate: Glucose (mg/dl): Meter #: Oximetry (%) Taken: Pre 12:39 109/57 78 16 98.3 Post 14:50 118/65 71 16 98.2 Treatment Response Treatment Toleration: Well Treatment Completion Status: Treatment Completed without Adverse Event Electronic Signature(s) Signed: 12/09/2021 4:09:09 PM By: Haywood Pao CHT EMT BS , , Signed: 12/09/2021 4:09:40 PM By: Geralyn [name] DO Entered By: Haywood Pao  on 12/09/2021 16:09:08 -------------------------------------------------------------------------------- HBO Safety Checklist Details Patient Name: Date of Service: [name], [name] [name] 12/09/2021 1:00 PM Medical Record Number: [id number] Patient Account Number: [id number] Date of Birth/Sex: Treating RN: January 27, 1951 (70 y.o. Lytle Michaels Primary Care Sergi Gellner: [name] Other Clinician: Haywood Pao Referring Marquan Vokes: Treating Hallee Mckenny/Extender: Cristopher Peru Weeks in Treatment: 7 HBO Safety Checklist Items Safety Checklist Consent Form Signed Patient voided / foley secured and emptied When did you last eato Breakfast Last dose of injectable or oral agent n/a Ostomy pouch emptied and vented if applicable NA All implantable devices assessed, documented and approved NA Intravenous access site secured and place NA Valuables secured Linens and cotton and cotton/polyester blend (less than 51% polyester) Personal oil-based products / skin lotions / body lotions removed Wigs or hairpieces removed NA Smoking or tobacco materials removed NA Books / newspapers / magazines / loose paper removed Cologne, aftershave, perfume and deodorant removed Jewelry removed (may wrap wedding band) Make-up removed NA Hair care products removed NA Battery operated devices (external) removed Heating patches and chemical warmers removed Titanium eyewear removed Nail polish cured greater than 10 hours NA Casting material cured greater than 10 hours NA Hearing aids removed NA Loose dentures or partials removed NA Prosthetics have been removed NA Patient demonstrates correct use of air break device (if applicable) Patient concerns have been addressed Patient grounding bracelet on and cord attached to chamber Specifics for Inpatients (complete in addition to above) Medication sheet sent with patient NA Intravenous medications needed or due during therapy sent  with patient NA Drainage tubes (e.g. nasogastric tube or chest tube secured and vented) NA Endotracheal or Tracheotomy tube secured NA Cuff deflated of air and inflated with saline NA Airway suctioned NA Electronic Signature(s) Signed: 12/09/2021 2:27:05 PM By: Haywood Pao CHT EMT BS , , Entered By: Haywood Pao on 12/09/2021  14:27:03

## 2021-12-09 NOTE — Progress Notes (Signed)
CRIAG, WICKLUND ([id number]) Visit Report for 12/06/2021 SuperBill Details Patient Name: Date of Service: [place], [name] 12/06/2021 Medical Record Number: [id number] Patient Account Number: [id number] Date of Birth/Sex: Treating RN: 06-13-1951 (70 y.o. 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, face and neck Facility Procedures The patient participates with Medicare or their insurance follows the Medicare Facility Guidelines CPT4 Code Description Modifier Quantity [id number] G0277-(Facility Use Only) HBOT full body chamber, 32min , 4 ICD-10 Diagnosis Description L59.8 Other specified disorders of the skin and subcutaneous tissue related to radiation 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

## 2021-12-09 NOTE — Progress Notes (Addendum)
[name], [name] ([id number]) Visit Report for 12/06/2021 Arrival Information Details Patient Name: Date of Service: [place], [name] 12/06/2021 10:00 A M Medical Record Number: [id number] Patient Account Number: [id number] Date of Birth/Sex: Treating RN: Jan 16, 1951 (70 y.o. [name], [name] Primary Care Axel Frisk: [name] Other Clinician: [name] Referring Meckenzie Balsley: Treating Anneka Studer/Extender: [name] in Treatment: 7 Visit Information History Since Last Visit All ordered tests and consults were completed: Yes Patient Arrived: Ambulatory Added or deleted any medications: No Arrival Time: 09:47 Any new allergies or adverse reactions: No Accompanied By: self Had a fall or experienced change in No Transfer Assistance: None activities of daily living that may affect Patient Identification Verified: Yes risk of falls: Secondary Verification Process Completed: Yes Signs or symptoms of abuse/neglect since last visito No Patient Requires Transmission-Based Precautions: No Hospitalized since last visit: No Patient Has Alerts: No Implantable device outside of the clinic excluding No cellular tissue based products placed in the center since last visit: Pain Present Now: No Electronic Signature(s) Signed: 12/06/2021 11:25:05 AM By: [name] CHT EMT BS , , Entered By: [name] on 12/06/2021 11:25:04 -------------------------------------------------------------------------------- Encounter Discharge Information Details Patient Name: Date of Service: [name], [name] 12/06/2021 10:00 A M Medical Record Number: [id number] Patient Account Number: [id number] Date of Birth/Sex: Treating RN: 28-Mar-1951 (70 y.o. [name] Primary Care Harriett Azar: [name] Other Clinician: [name] Referring Samayra Hebel: Treating Elmond Poehlman/Extender: [name] in Treatment: 7 Encounter  Discharge Information Items Discharge Condition: Stable Ambulatory Status: Ambulatory Discharge Destination: Home Transportation: Private Auto Accompanied By: self Schedule Follow-up Appointment: No Clinical Summary of Care: Electronic Signature(s) Signed: 12/06/2021 12:49:01 PM By: [name] CHT EMT BS , , Entered By: [name] on 12/06/2021 12:49:00 -------------------------------------------------------------------------------- [place] Details Patient Name: Date of Service: [name], [name] 12/06/2021 10:00 A M Medical Record Number: [id number] Patient Account Number: [id number] Date of Birth/Sex: Treating RN: 1951-08-27 (70 y.o. [name] Primary Care Trella Thurmond: [name] Other Clinician: [name] Batman Referring Hannalee Castor: Treating Sofie Schendel/Extender: [name] Weeks in Treatment: 7 Vital Signs Time Taken: 10:00 Temperature (F): 98.0 Height (in): 68 Pulse (bpm): 98 Weight (lbs): 127 Respiratory Rate (breaths/min): 16 Body Mass Index (BMI): 19.3 Blood Pressure (mmHg): 98/40 Reference Range: 80 - 120 mg / dl Airway Pulse Oximetry (%): 99 Notes Alerted MD @ 4403 Electronic Signature(s) Signed: 12/06/2021 11:26:43 AM By: [name] CHT EMT BS , , Entered By: [name] on 12/06/2021 11:26:43

## 2021-12-10 ENCOUNTER — Encounter (HOSPITAL_BASED_OUTPATIENT_CLINIC_OR_DEPARTMENT_OTHER): Payer: Medicare Other | Admitting: Internal Medicine

## 2021-12-10 DIAGNOSIS — L598 Other specified disorders of the skin and subcutaneous tissue related to radiation: Secondary | ICD-10-CM | POA: Diagnosis not present

## 2021-12-10 NOTE — Progress Notes (Addendum)
[name], [name] ([id number]) Visit Report for 12/10/2021 Problem List Details Patient Name: Date of Service: [place], [name] 12/10/2021 1:00 PM Medical Record Number: [id number] Patient Account Number: [id number] Date of Birth/Sex: Treating RN: 1951-06-06 (70 y.o. 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 12/10/2021 Medical Record Number: [id number] Patient Account Number: [id number] Date of Birth/Sex: Treating RN: 02/26/1951 (70 y.o. 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, oral cavity, and pharynx Facility Procedures The patient participates with Medicare or their  insurance follows the Medicare Facility Guidelines: CPT4 Code Description Modifier Quantity [id number] G0277-(Facility Use Only) HBOT full body chamber, 58min , 4 ICD-10 Diagnosis Description L59.8 Other  specified disorders of the skin and subcutaneous tissue related to radiation Z51.0 Encounter for antineoplastic radiation therapy R13.19 Other dysphagia 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, oral cavity, and pharynx Physician Procedures : CPT4 Code Description Modifier [id number] 27253 - WC PHYS HYPERBARIC OXYGEN THERAPY ICD-10 Diagnosis Description L59.8 Other specified disorders of the skin and subcutaneous tissue related to radiation Z51.0 Encounter for antineoplastic radiation therapy  R13.19 Other dysphagia 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

## 2021-12-10 NOTE — Progress Notes (Signed)
RHODES, CALVERT ([id number]) Visit Report for 12/10/2021 Arrival Information Details Patient Name: Date of Service: [place], [name] 12/10/2021 1:00 PM Medical Record Number: [id number] Patient Account Number: [id number] Date of Birth/Sex: Treating RN: [birth year] (70 y.o. [name] Primary Care Geraldine Sandberg: [name] Other Clinician: [name] Batman Referring Angenette Daily: Treating Milica Gully/Extender: Tacey Ruiz in Treatment: 8 Visit Information History Since Last Visit All ordered tests and consults were completed: Yes Patient Arrived: Ambulatory Added or deleted any medications: No Arrival Time: 12:26 Any new allergies or adverse reactions: No Accompanied By: None Had a fall or experienced change in No Transfer Assistance: None activities of daily living that may affect Patient Identification Verified: Yes risk of falls: Secondary Verification Process Completed: Yes Signs or symptoms of abuse/neglect since last visito No Patient Requires Transmission-Based Precautions: No Hospitalized since last visit: No Patient Has Alerts: No Implantable device outside of the clinic excluding No cellular tissue based products placed in the center since last visit: Pain Present Now: No Electronic Signature(s) Signed: 12/10/2021 3:02:30 PM By: [name] Batman EMT Entered By: [name] Batman on 12/10/2021 15:02:30 -------------------------------------------------------------------------------- Encounter Discharge Information Details Patient Name: Date of Service: [name], [name] 12/10/2021 1:00 PM Medical Record Number: [id number] Patient Account Number: [id number] Date of Birth/Sex: Treating RN: 29-Nov-1951 (70 y.o. [name] Primary Care Gwendy Boeder: [name] Other Clinician: [name] Batman Referring Surina Storts: Treating Isaiyah Feldhaus/Extender: Tacey Ruiz in Treatment: 8 Encounter Discharge Information Items Discharge  Condition: Stable Ambulatory Status: Ambulatory Discharge Destination: Home Transportation: Private Auto Accompanied By: None Schedule Follow-up Appointment: Yes Clinical Summary of Care: Electronic Signature(s) Signed: 12/10/2021 3:10:34 PM By: [name] Batman EMT Entered By: [name] Batman on 12/10/2021 15:10:34 -------------------------------------------------------------------------------- Patient/Caregiver Education Details Patient Name: Date of Service: [name], [name] 12/20/2022andnbsp1:00 PM Medical Record Number: [id number] Patient Account Number: [id number] Date of Birth/Gender: Treating RN: [birth year] (70 y.o. [name] Primary Care Physician: [name] Other Clinician: [name] Batman Referring Physician: Treating Physician/Extender: Tacey Ruiz in Treatment: 8 Education Assessment Education Provided To: Patient Education Topics Provided Electronic Signature(s) Signed: 12/10/2021 3:10:08 PM By: [name] Batman EMT Entered By: [name] Batman on 12/10/2021 15:10:08 -------------------------------------------------------------------------------- [place] Details Patient Name: Date of Service: [name], [name] 12/10/2021 1:00 PM Medical Record Number: [id number] Patient Account Number: [id number] Date of Birth/Sex: Treating RN: 16-Nov-1951 (70 y.o. [name] Primary Care [place] Lesure: [name] Other Clinician: [name] Batman Referring Chrisha Vogel: Treating Anessa Charley/Extender: [name], [name] in Treatment: 8 Vital Signs Time Taken: 12:50 Temperature (F): 98.3 Height (in): 68 Pulse (bpm): 70 Weight (lbs): 127 Respiratory Rate (breaths/min): 16 Body Mass Index (BMI): 19.3 Blood Pressure (mmHg): 108/52 Reference Range: 80 - 120 mg / dl Airway Pulse Oximetry (%): 100 Electronic Signature(s) Signed: 12/10/2021 3:03:09 PM By: [name] Batman EMT Entered By: [name] Batman on 12/10/2021  15:03:09

## 2021-12-10 NOTE — Progress Notes (Addendum)
[name], [name] ([id number]) Visit Report for 12/10/2021 HBO Details Patient Name: Date of Service: [name], [name] 12/10/2021 1:00 PM Medical Record Number: [id number] Patient Account Number: [id number] Date of Birth/Sex: Treating RN: 1951-03-26 (70 y.o. 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 HBO Treatment Details Treatment Number: 15 Patient Type: Outpatient Chamber Type: Monoplace Chamber Serial #: [id number] Treatment Protocol: 2.0 ATA with 90 minutes oxygen, and no air breaks Treatment Details Compression Rate Down: 2.0 psi / minute De-Compression Rate Up: 2.0 psi / minute Air breaks and breathing Decompress Decompress Compress Tx Pressure Begins Reached periods Begins Ends (leave unused spaces blank) Chamber Pressure (ATA 1 2 ------2 1 ) Clock Time (24 hr) 12:59 13:07 - - - - - - 14:37 14:45 Treatment Length: 106 (minutes) Treatment Segments: 4 Vital Signs Capillary Blood Glucose Reference Range: 80 - 120 mg / dl HBO Diabetic Blood Glucose Intervention Range: <131 mg/dl or >425 mg/dl Time Vitals Blood Respiratory Capillary Blood Glucose Pulse Action Type: Pulse: Temperature: Taken: Pressure: Rate: Glucose (mg/dl): Meter #: Oximetry (%) Taken: Pre 12:50 108/52 70 16 98.3 100 Post 14:46 110/54 70 16 98.5 Treatment Response Treatment Toleration: Well Treatment Completion Status: Treatment Completed without Adverse Event Treatment Notes Information entered from paper intake sheet. I certify that I directed and performed operation of said hyperbaric chamber for this treatment. Juwann Sherk  Notes No concerns with treatment given. I did talk to the patient after treatment he has not noticed any difference in his voice, swallowing but it may be too soon to expect this. Physician HBO Attestation: I certify that I supervised this HBO treatment in accordance with Medicare guidelines. A trained emergency response team is readily available per Yes hospital policies and procedures. Continue HBOT as ordered. Yes Electronic Signature(s) Signed: 12/11/2021 5:02:28 PM By: Haywood Pao CHT EMT BS , , Signed: 12/12/2021 3:43:54 PM By: [name] MD Previous Signature: 12/10/2021 4:29:35 PM Version By: [name] MD Previous Signature: 12/10/2021 4:10:23 PM Version By: Haywood Pao CHT EMT BS , , Previous Signature: 12/10/2021 3:08:05 PM Version By: [name] EMT Entered By: Haywood Pao on 12/11/2021 17:02:28 -------------------------------------------------------------------------------- HBO Safety Checklist Details Patient Name: Date of Service: [place], [name] 12/10/2021 1:00 PM Medical Record Number: [id number] Patient Account Number: [id number] Date of Birth/Sex: Treating RN: 11/28/1951 (70 y.o. [name] Primary Care Choua Chalker: [name] Other Clinician: Haywood Pao Referring Mikaiah Stoffer: Treating Daya Dutt/Extender: [name] in Treatment: 8 HBO Safety Checklist Items Safety Checklist Consent Form Signed Patient voided / foley secured and emptied When did you last eato 0800 Last dose of injectable or oral agent Ostomy pouch emptied and vented if applicable NA All implantable devices assessed, documented and approved NA Intravenous access site secured and place NA Valuables secured Linens and cotton and cotton/polyester blend (less than 51% polyester) Personal oil-based products / skin lotions / body lotions removed Wigs or hairpieces removed Smoking or tobacco materials removed Books /  newspapers / magazines / loose paper removed Cologne, aftershave, perfume and deodorant removed Jewelry removed (may wrap wedding band) Make-up removed NA Hair care products removed Battery operated devices (external) removed Heating patches and chemical warmers removed Titanium eyewear removed NA Nail polish cured greater than 10  hours NA Casting material cured greater than 10 hours NA Hearing aids removed NA Loose dentures or partials removed NA Prosthetics have been removed NA Patient demonstrates correct use of air break device (if applicable) Patient concerns have been addressed Patient grounding bracelet on and cord attached to chamber Specifics for Inpatients (complete in addition to above) Medication sheet sent with patient NA Intravenous medications needed or due during therapy sent with patient NA Drainage tubes (e.g. nasogastric tube or chest tube secured and vented) NA Endotracheal or Tracheotomy tube secured NA Cuff deflated of air and inflated with saline NA Airway suctioned NA Notes Paper version used prior to treatment. I certify that I directed and performed the safety check for this treatment. MScammell Electronic Signature(s) Signed: 12/10/2021 4:09:37 PM By: Haywood Pao CHT EMT BS , , Previous Signature: 12/10/2021 3:04:25 PM Version By: [name] EMT Entered By: Haywood Pao on 12/10/2021 16:09:37

## 2021-12-11 ENCOUNTER — Encounter (HOSPITAL_BASED_OUTPATIENT_CLINIC_OR_DEPARTMENT_OTHER): Payer: Medicare Other | Admitting: Physician Assistant

## 2021-12-11 ENCOUNTER — Other Ambulatory Visit: Payer: Self-pay

## 2021-12-11 DIAGNOSIS — L598 Other specified disorders of the skin and subcutaneous tissue related to radiation: Secondary | ICD-10-CM | POA: Diagnosis not present

## 2021-12-11 NOTE — Progress Notes (Addendum)
ALVIN, DIFFEE ([id number]) Visit Report for 12/11/2021 Arrival Information Details Patient Name: Date of Service: [place], [name] 12/11/2021 1:00 PM Medical Record Number: [id number] Patient Account Number: [id number] Date of Birth/Sex: Treating RN: 1951-06-25 (70 y.o. [name] Primary Care Sriman Tally: [name] Other Clinician: [name] Batman Referring Tiani Stanbery: Treating Decarla Siemen/Extender: Alinda Dooms, Tiffany Weeks in Treatment: 8 Visit Information History Since Last Visit All ordered tests and consults were completed: Yes Patient Arrived: Ambulatory Added or deleted any medications: No Arrival Time: 12:18 Any new allergies or adverse reactions: No Accompanied By: none Had a fall or experienced change in No Transfer Assistance: None activities of daily living that may affect Patient Identification Verified: Yes risk of falls: Secondary Verification Process Completed: Yes Signs or symptoms of abuse/neglect since last visito No Patient Requires Transmission-Based Precautions: No Hospitalized since last visit: No Patient Has Alerts: No Pain Present Now: No Electronic Signature(s) Signed: 12/11/2021 3:15:08 PM By: [name] Batman EMT Entered By: [name] Batman on 12/11/2021 15:15:07 -------------------------------------------------------------------------------- Encounter Discharge Information Details Patient Name: Date of Service: [name], [name] 12/11/2021 1:00 PM Medical Record Number: [id number] Patient Account Number: [id number] Date of Birth/Sex: Treating RN: [birth year] (70 y.o. [name] Primary Care Dorice Stiggers: [name] Other Clinician: [name] Batman Referring Kilan Banfill: Treating Cirilo Canner/Extender: Alinda Dooms, Tiffany Weeks in Treatment: 8 Encounter Discharge Information Items Discharge Condition: Stable Ambulatory Status: Ambulatory Discharge Destination: Home Transportation: Private Auto Accompanied By:  None Schedule Follow-up Appointment: No Clinical Summary of Care: Patient Declined Electronic Signature(s) Signed: 12/11/2021 3:25:41 PM By: [name] Batman EMT Entered By: [name] Batman on 12/11/2021 15:25:41 -------------------------------------------------------------------------------- Patient/Caregiver Education Details Patient Name: Date of Service: [name], [name] 12/21/2022andnbsp1:00 PM Medical Record Number: [id number] Patient Account Number: [id number] Date of Birth/Gender: Treating RN: 12-08-1951 (70 y.o. [name] Primary Care Physician: [name] Other Clinician: [name] Batman Referring Physician: Treating Physician/Extender: Idalia Needle in Treatment: 8 Education Assessment Education Provided To: Patient Education Topics Provided Electronic Signature(s) Signed: 12/11/2021 3:25:02 PM By: [name] Batman EMT Entered By: [name] Batman on 12/11/2021 15:25:02 -------------------------------------------------------------------------------- Vitals Details Patient Name: Date of Service: [name], [name] 12/11/2021 1:00 PM Medical Record Number: [id number] Patient Account Number: [id number] Date of Birth/Sex: Treating RN: 06-28-1951 (70 y.o. [name] Primary Care Odeth Bry: [name] Other Clinician: [name] Batman Referring Keyion Knack: Treating Marisa Hufstetler/Extender: Alinda Dooms, Tiffany Weeks in Treatment: 8 Vital Signs Time Taken: 12:40 Temperature (F): 98.4 Height (in): 68 Pulse (bpm): 69 Weight (lbs): 127 Respiratory Rate (breaths/min): 16 Body Mass Index (BMI): 19.3 Blood Pressure (mmHg): 104/56 Reference Range: 80 - 120 mg / dl Electronic Signature(s) Signed: 12/11/2021 3:16:38 PM By: [name] Batman EMT Entered By: [name] Batman on 12/11/2021 15:16:38

## 2021-12-11 NOTE — Progress Notes (Addendum)
AMARDEEP, BECKERS ([id number]) Visit Report for 12/11/2021 Problem List Details Patient Name: Date of Service: [place], [name] 12/11/2021 1:00 PM Medical Record Number: [id number] Patient Account Number: [id number] Date of Birth/Sex: Treating RN: April 05, 1951 (70 y.o. 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 12/11/2021 Medical Record Number: [id number] Patient Account Number: [id number] Date of Birth/Sex: Treating RN: [birth year] (70 y.o. 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, oral cavity, and pharynx Facility Procedures The patient participates with Medicare or their  insurance follows the Medicare Facility Guidelines: CPT4 Code Description Modifier Quantity [id number] G0277-(Facility Use Only) HBOT full body chamber, 62min , 4 ICD-10 Diagnosis Description L59.8 Other  specified disorders of the skin and subcutaneous tissue related to radiation Z51.0 Encounter for antineoplastic radiation therapy R13.19 Other dysphagia 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, oral cavity, and pharynx Physician Procedures : CPT4 Code Description Modifier [id number] - WC PHYS HYPERBARIC OXYGEN THERAPY ICD-10 Diagnosis Description L59.8 Other specified disorders of the skin and subcutaneous tissue related to radiation Z51.0 Encounter for antineoplastic radiation therapy  R13.19 Other dysphagia 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

## 2021-12-11 NOTE — Progress Notes (Addendum)
[name], Juarez ([id number]) Visit Report for 12/11/2021 HBO Details Patient Name: Date of Service: [name] 12/11/2021 1:00 PM Medical Record Number: [id number] Patient Account Number: [id number] Date of Birth/Sex: Treating RN: 10-19-1951 (70 y.o. 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 HBO Treatment Details Treatment Number: 16 Patient Type: Outpatient Chamber Type: Monoplace Chamber Serial #: [id number] Treatment Protocol: 2.0 ATA with 90 minutes oxygen, and no air breaks Treatment Details Compression Rate Down: 2.0 psi / minute De-Compression Rate Up: 2.0 psi / minute Air breaks and breathing Decompress Decompress Compress Tx Pressure Begins Reached periods Begins Ends (leave unused spaces blank) Chamber Pressure (ATA 1 2 ------2 1 ) Clock Time (24 hr) 12:47 12:55 - - - - - - 14:25 14:33 Treatment Length: 106 (minutes) Treatment Segments: 4 Vital Signs Capillary Blood Glucose Reference Range: 80 - 120 mg / dl HBO Diabetic Blood Glucose Intervention Range: <131 mg/dl or >782 mg/dl Time Vitals Blood Respiratory Capillary Blood Glucose Pulse Action Type: Pulse: Temperature: Taken: Pressure: Rate: Glucose (mg/dl): Meter #: Oximetry (%) Taken: Pre 12:40 104/56 69 16 98.4 Post 14:36 112/63 71 16 98.4 Treatment Response Treatment Toleration: Well Treatment Completion Status: Treatment Completed without Adverse Event Treatment Notes Information entered from paper intake sheet. I certify that I directed and performed said hyperbaric chamber for this treatment. MSc[place]ell Electronic  Signature(s) Signed: 12/12/2021 8:57:22 AM By: Haywood Pao CHT EMT BS , , Signed: 01/01/2022 5:05:54 PM By: Lenda Kelp PA-C Previous Signature: 12/12/2021 8:55:17 AM Version By: Haywood Pao CHT EMT BS , , Previous Signature: 12/12/2021 8:50:57 AM Version By: Haywood Pao CHT EMT BS , , Previous Signature: 12/11/2021 3:23:32 PM Version By: [name] EMT Previous Signature: 12/11/2021 5:18:31 PM Version By: Lenda Kelp PA-C Previous Signature: 12/11/2021 3:21:03 PM Version By: [name] EMT Entered By: Haywood Pao on 12/12/2021 08:57:22 -------------------------------------------------------------------------------- HBO Safety Checklist Details Patient Name: Date of Service: [place], [name] 12/11/2021 1:00 PM Medical Record Number: [id number] Patient Account Number: [id number] Date of Birth/Sex: Treating RN: 1951/01/03 (70 y.o. [name] Primary Care [name]: [name] Other Clinician: [name] Referring [name]: Treating Mearl Olver/Extender: Roberts Gaudy, Tiffany Weeks in Treatment: 8 HBO Safety Checklist Items Safety Checklist Consent Form Signed Patient voided / foley secured and emptied When did you last eato 0800 Last dose of injectable or oral agent Ostomy pouch emptied and vented if applicable NA All implantable devices assessed, documented and approved Intravenous access site secured and place NA Valuables secured Linens and cotton and cotton/polyester blend (less than 51% polyester) Personal oil-based products / skin lotions / body lotions removed Wigs or hairpieces removed NA Smoking or tobacco materials removed Books / newspapers / magazines / loose paper removed Cologne, aftershave, perfume and deodorant removed Jewelry removed (may wrap wedding band) Make-up removed NA Hair care products removed Battery operated devices (external) removed Heating patches and chemical warmers  removed Titanium eyewear removed NA Nail polish cured greater than 10 hours NA Casting material cured greater than 10 hours NA Hearing aids removed NA Loose dentures or partials removed NA Prosthetics have been removed NA Patient demonstrates correct use of air break device (if applicable) Patient concerns have been addressed Patient grounding bracelet  on and cord attached to chamber Specifics for Inpatients (complete in addition to above) Medication sheet sent with patient NA Intravenous medications needed or due during therapy sent with patient NA Drainage tubes (e.g. nasogastric tube or chest tube secured and vented) NA Endotracheal or Tracheotomy tube secured NA Cuff deflated of air and inflated with saline NA Airway suctioned NA Notes Paper version used prior to treatment. I certify that I directed and performed the safety check for this treatment. MSc[place]ell Electronic Signature(s) Signed: 12/12/2021 8:56:29 AM By: Haywood Pao CHT EMT BS , , Previous Signature: 12/11/2021 3:18:10 PM Version By: [name] EMT Entered By: Haywood Pao on 12/12/2021 95:28:41

## 2021-12-12 ENCOUNTER — Encounter (HOSPITAL_BASED_OUTPATIENT_CLINIC_OR_DEPARTMENT_OTHER): Payer: Medicare Other | Admitting: Internal Medicine

## 2021-12-12 DIAGNOSIS — L598 Other specified disorders of the skin and subcutaneous tissue related to radiation: Secondary | ICD-10-CM | POA: Diagnosis not present

## 2021-12-12 NOTE — Progress Notes (Addendum)
No Bathing: No Appetite: No Relationship With Others: No Bladder Continence: No Emotions: No Bowel Continence: No Work: No Toileting: No Drive: No Dressing: No Hobbies: No Engineer, maintenance) Signed: 12/12/2021 5:08:09 PM By: Deon Pilling RN, BSN Signed: 12/12/2021 5:08:09 PM By: Deon Pilling RN, BSN Entered By: Deon Pilling on 12/12/2021 13:12:54 -------------------------------------------------------------------------------- Patient/Caregiver Education Details Patient Name: Date of Service: [name] 12/22/2022andnbsp3:15 PM Medical Record Number: [id number] Patient Account Number: [id number] Date of Birth/Gender: Treating RN: [birth year] (70 y.o. [name] Primary Care Physician: [name] Other Clinician: Referring Physician: Treating Physician/Extender: Tacey Ruiz in Treatment: 8 Education Assessment Education Provided To: Patient Education Topics Provided Hyperbaric Oxygenation: Handouts: Hyperbaric Oxygen Methods: Explain/Verbal Responses: Return demonstration correctly Electronic Signature(s) Signed: 12/12/2021 5:08:09 PM By: Deon Pilling RN, BSN Entered By: Deon Pilling on 12/12/2021 13:13:48  of wounds) []  - 0 Wound Tracing (instead of photographs) []  - 0 Simple Wound Measurement - one wound []  - 0 Complex Wound Measurement - multiple wounds INTERVENTIONS - Wound Dressings []  - 0 Small Wound Dressing one or multiple wounds []  - 0 Medium Wound Dressing one or multiple wounds []  - 0 Large Wound Dressing one or multiple wounds []  - 0 Application of Medications - topical []  - 0 Application of Medications - injection INTERVENTIONS - Miscellaneous []  - 0 External ear exam []  - 0 Specimen Collection (cultures, biopsies, blood, body fluids, etc.) []  - 0 Specimen(s) / Culture(s) sent or taken to Lab for analysis []  - 0 Patient Transfer (multiple staff / [employer] fast streamer / Similar devices) []  - 0 Simple Staple / Suture removal (25 or less) []  - 0 Complex Staple / Suture removal (26 or more) []  - 0 Hypo / Hyperglycemic Management (close monitor of Blood Glucose) []  - 0 Ankle / Brachial Index (ABI) - do not check if billed separately X- 1 5 Vital Signs Has the patient been seen at the hospital within the last three years: Yes Total Score: 65 Level Of Care: New/Established - Level 2 Electronic Signature(s) Signed: 12/12/2021 5:08:09 PM By: Deon Pilling RN, BSN Entered By: Deon Pilling on 12/12/2021 13:14:10 -------------------------------------------------------------------------------- Encounter Discharge Information Details Patient Name: Date of Service: [name] Lango, [name] 12/12/2021 3:15 PM Medical Record Number: [id number] Patient Account Number: [id number] Date of Birth/Sex: Treating RN: 1951/07/26 (70 y.o. [name] Primary Care Farid Grigorian: [name] Other Clinician: Referring Jadalyn Oliveri: Treating Mykira Hofmeister/Extender: Tacey Ruiz in  Treatment: 8 Encounter Discharge Information Items Discharge Condition: Stable Ambulatory Status: Ambulatory Transportation: Private Auto Accompanied By: self Schedule Follow-up Appointment: No Clinical Summary of Care: Notes Patient to proceed to HBO. Electronic Signature(s) Signed: 12/12/2021 5:08:09 PM By: Deon Pilling RN, BSN Entered By: Deon Pilling on 12/12/2021 13:15:26 -------------------------------------------------------------------------------- Lower Extremity Assessment Details Patient Name: Date of Service: [name], [name] [name] 12/12/2021 3:15 PM Medical Record Number: [id number] Patient Account Number: [id number] Date of Birth/Sex: Treating RN: [birth year] (70 y.o. [name] Primary Care Tomma Ehinger: [name] Other Clinician: Referring Davinder Haff: Treating Jessica Seidman/Extender: Tacey Ruiz in Treatment: 8 Electronic Signature(s) Signed: 12/12/2021 5:08:09 PM By: Deon Pilling RN, BSN Entered By: Deon Pilling on 12/12/2021 13:13:01 -------------------------------------------------------------------------------- [place] Details Patient Name: Date of Service: [name], [place] S. 12/12/2021 3:15 PM Medical Record Number: [id number] Patient Account Number: [id number] Date of Birth/Sex: Treating RN: May 27, 1951 (70 y.o. [name] Primary Care Ladon Vandenberghe: [name] Other Clinician: Referring Eusebia Grulke: Treating Maloni Musleh/Extender: [name], [name] in Treatment: 8 Active Inactive Electronic Signature(s) Signed: 02/18/2022 4:56:45 PM By: Deon Pilling RN, BSN Previous Signature: 12/12/2021 5:08:09 PM Version By: Deon Pilling RN, BSN Entered By: Deon Pilling on 02/18/2022 16:56:44 -------------------------------------------------------------------------------- Pain Assessment Details Patient Name: Date of Service: [name] Lango, [name] 12/12/2021 3:15 PM Medical Record Number:  [id number] Patient Account Number: [id number] Date of Birth/Sex: Treating RN: [birth year] (70 y.o. [name] Primary Care Harshaan Whang: [name] Other Clinician: Referring Karlin Binion: Treating Doreatha Offer/Extender: [name], [name] in Treatment: 8 Active Problems Location of Pain Severity and Description of Pain Patient Has Paino No Site Locations Pain Management and Medication Current Pain Management: Medication: No Cold Application: No Rest: No Massage: No Activity: No T.E.N.S.: No Heat Application: No Leg drop or elevation: No Is the Current Pain Management Adequate: Adequate How does your wound impact your activities of daily livingo Sleep:  No Bathing: No Appetite: No Relationship With Others: No Bladder Continence: No Emotions: No Bowel Continence: No Work: No Toileting: No Drive: No Dressing: No Hobbies: No Engineer, maintenance) Signed: 12/12/2021 5:08:09 PM By: Deon Pilling RN, BSN Signed: 12/12/2021 5:08:09 PM By: Deon Pilling RN, BSN Entered By: Deon Pilling on 12/12/2021 13:12:54 -------------------------------------------------------------------------------- Patient/Caregiver Education Details Patient Name: Date of Service: [name] 12/22/2022andnbsp3:15 PM Medical Record Number: [id number] Patient Account Number: [id number] Date of Birth/Gender: Treating RN: [birth year] (70 y.o. [name] Primary Care Physician: [name] Other Clinician: Referring Physician: Treating Physician/Extender: Tacey Ruiz in Treatment: 8 Education Assessment Education Provided To: Patient Education Topics Provided Hyperbaric Oxygenation: Handouts: Hyperbaric Oxygen Methods: Explain/Verbal Responses: Return demonstration correctly Electronic Signature(s) Signed: 12/12/2021 5:08:09 PM By: Deon Pilling RN, BSN Entered By: Deon Pilling on 12/12/2021 13:13:48

## 2021-12-12 NOTE — Progress Notes (Addendum)
DIMETRI, DRUCKMAN ([id number]) Visit Report for 12/12/2021 HBO Details Patient Name: Date of Service: [place], [place] 12/12/2021 1:00 PM Medical Record Number: [id number] Patient Account Number: [id number] Date of Birth/Sex: Treating RN: 1951/04/03 (70 y.o. 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 HBO Treatment Details Treatment Number: 17 Patient Type: Outpatient Chamber Type: Monoplace Chamber Serial #: [id number] Treatment Protocol: 2.0 ATA with 90 minutes oxygen, and no air breaks Treatment Details Compression Rate Down: 2.0 psi / minute De-Compression Rate Up: 2.0 psi / minute Air breaks and breathing Decompress Decompress Compress Tx Pressure Begins Reached periods Begins Ends (leave unused spaces blank) Chamber Pressure (ATA 1 2 ------2 1 ) Clock Time (24 hr) 13:09 13:16 - - - - - - 14:46 14:56 Treatment Length: 107 (minutes) Treatment Segments: 4 Vital Signs Capillary Blood Glucose Reference Range: 80 - 120 mg / dl HBO Diabetic Blood Glucose Intervention Range: <131 mg/dl or >956 mg/dl Time Vitals Blood Respiratory Capillary Blood Glucose Pulse Action Type: Pulse: Temperature: Taken: Pressure: Rate: Glucose (mg/dl): Meter #: Oximetry (%) Taken: Pre 12:58 124/50 78 18 98.4 100 Post 14:59 108/59 69 18 98.8 Treatment Response Treatment Toleration: Well Treatment Completion Status: Treatment Completed without Adverse Event Alwaleed Obeso Notes No concerns with treatment given patient was also seen for monthly evaluation Physician HBO Attestation: I certify that I supervised this HBO treatment in  accordance with Medicare guidelines. A trained emergency response team is readily available per Yes hospital policies and procedures. Continue HBOT as ordered. Yes Electronic Signature(s) Signed: 12/12/2021 3:43:54 PM By: [name] MD Previous Signature: 12/12/2021 3:09:36 PM Version By: Haywood Pao CHT EMT BS , , Entered By: [name] on 12/12/2021 15:41:33 -------------------------------------------------------------------------------- HBO Safety Checklist Details Patient Name: Date of Service: [place], [name] 12/12/2021 1:00 PM Medical Record Number: [id number] Patient Account Number: [id number] Date of Birth/Sex: Treating RN: Jul 04, 1951 (70 y.o. [name], [name] Primary Care Tiwana Chavis: [name] Other Clinician: Haywood Pao Referring Niara Bunker: Treating Cleva Camero/Extender: [name] in Treatment: 8 HBO Safety Checklist Items Safety Checklist Consent Form Signed Patient voided / foley secured and emptied When did you last eato 1030 Last dose of injectable or oral agent n/a Ostomy pouch emptied and vented if applicable NA All implantable devices assessed, documented and approved NA Intravenous access site secured and place NA Valuables secured Linens and cotton and cotton/polyester blend (less than 51% polyester) Personal oil-based products / skin lotions / body lotions removed Wigs or hairpieces removed NA Smoking or tobacco materials removed NA Books / newspapers / magazines / loose paper removed Cologne, aftershave, perfume and deodorant removed Jewelry removed (may wrap wedding band) Make-up removed NA Hair care products removed NA Battery operated devices (external) removed Heating patches and chemical warmers removed Titanium eyewear removed NA Not wearing eyewear in chamber (nickel) Nail polish cured greater than 10 hours NA Casting material cured greater than 10 hours NA Hearing aids  removed NA Loose dentures or partials removed NA Prosthetics have been removed NA Patient demonstrates correct use of air break device (if applicable) Patient concerns have been addressed Patient grounding bracelet on and cord attached to chamber Specifics for Inpatients (complete in addition to above) Medication sheet sent with patient NA Intravenous medications  needed or due during therapy sent with patient NA Drainage tubes (e.g. nasogastric tube or chest tube secured and vented) NA Endotracheal or Tracheotomy tube secured NA Cuff deflated of air and inflated with saline NA Airway suctioned NA Notes Paper version used prior to treatment. Electronic Signature(s) Signed: 12/12/2021 5:42:23 PM By: Haywood Pao CHT EMT BS , , Previous Signature: 12/12/2021 5:37:43 PM Version By: Haywood Pao CHT EMT BS , , Previous Signature: 12/12/2021 3:08:04 PM Version By: Haywood Pao CHT EMT BS , , Entered By: Haywood Pao on 12/12/2021 17:42:23

## 2021-12-12 NOTE — Progress Notes (Signed)
appropriate for treatment, begin HBOT per protocol: 2.0 ATA for 90 Minutes without A Breaks ir Total Number of Treatments: - 40 One treatments per day (delivered Monday through Friday unless otherwise specified in Special Instructions below): A frin (Oxymetazoline HCL) 0.05% nasal spray - 1 spray in both nostrils daily as needed prior to HBO treatment for difficulty clearing ears Electronic Signature(s) Signed: 12/12/2021 3:43:54 PM By: [name] Juarez Signed: 12/12/2021 5:08:09 PM By: Deon Pilling RN, BSN Entered By: Deon Pilling on 12/12/2021 13:14:50 Prescription 12/12/2021 -------------------------------------------------------------------------------- [name] Patient Name: Provider: [birth year] [id number] Date of Birth: NPI#[name] [id number] Sex: DEA #: [phone] Phone #: License #: [employer] Patient Address: [address] [place] . [address] [place], [place] [address] [place], [place] [address] [phone] Allergies No Known Allergies Provider's Orders If appropriate for treatment, begin HBOT per protocol: Hand Signature: Date(s): Prescription 12/12/2021 [name] Patient Name: Provider: 10/13/1973 [id number] Date of Birth: NPI#: [name] [id number] Sex: DEA #: [phone] Phone #: License #: [place] Patient Address: [address] [place] . [address] [place], [place] [address] [place], [place] [address] [phone] Allergies No Known Allergies Provider's Orders 2.0 ATA for 90 Minutes without A Breaks ir Hand Signature: Date(s): Prescription 12/12/2021 [name] Patient Name: Provider: [birth year] [id number] Date of Birth: NPI#: [name] [id number] Sex: DEA #: [phone] Phone #: License #: [place] Patient Address: [address] [place] . [address] [place], [place] [address] [place], [place] [address] [phone] Allergies No Known Allergies Provider's Orders One treatments per day (delivered Monday through Friday unless otherwise specified in Special Instructions below): Hand Signature: Date(s): Prescription 12/12/2021 [name] Patient Name: Provider: [birth year] [id number] Date of Birth: NPI#: [name] [id number] Sex: DEA #: [phone] Phone #: License #: [place] Patient Address: [address] [place] . [address] [place], [place] [address] [place], [place] [address] [phone] Allergies No Known Allergies Provider's Orders Afrin (Oxymetazoline HCL) 0.05% nasal spray - 1 spray in both nostrils daily as needed prior to HBO treatment for difficulty clearing ears Hand Signature: Date(s): Electronic Signature(s) Signed: 12/12/2021 3:43:54 PM By: [name] Juarez Signed: 12/12/2021 5:08:09 PM By: Deon Pilling RN, BSN Entered By: Deon Pilling on 12/12/2021 13:14:50 -------------------------------------------------------------------------------- Problem List Details Patient Name: Date of Service: [name] [place], [name] 12/12/2021 3:15 PM Medical Record Number: [id number] Patient Account Number: [id number] Date of Birth/Sex: Treating RN: 04-Dec-1951 (70 y.o. [name] Primary Care Provider: [name] Other Clinician: Referring Provider: Treating Provider/Extender: [name], [name] in Treatment: 8 Active Problems ICD-10 Encounter Code Description Active Date MDM Diagnosis L59.8 Other specified disorders of the skin and subcutaneous tissue related to 10/15/2021 No Yes radiation Z51.0 Encounter for antineoplastic radiation therapy 10/15/2021 No Yes R13.19 Other dysphagia 10/15/2021 No  Yes 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, oral cavity, and 10/15/2021 No Yes pharynx Inactive Problems Resolved Problems Electronic Signature(s) Signed: 12/12/2021 3:43:54 PM By: [name] Juarez Entered By: [name] on 12/12/2021 13:11:22 -------------------------------------------------------------------------------- Progress Note Details Patient Name: Date of Service: [name] Lango, [name] 12/12/2021 3:15 PM Medical Record Number: [id number] Patient Account Number:  appropriate for treatment, begin HBOT per protocol: 2.0 ATA for 90 Minutes without A Breaks ir Total Number of Treatments: - 40 One treatments per day (delivered Monday through Friday unless otherwise specified in Special Instructions below): A frin (Oxymetazoline HCL) 0.05% nasal spray - 1 spray in both nostrils daily as needed prior to HBO treatment for difficulty clearing ears Electronic Signature(s) Signed: 12/12/2021 3:43:54 PM By: [name] Juarez Signed: 12/12/2021 5:08:09 PM By: Deon Pilling RN, BSN Entered By: Deon Pilling on 12/12/2021 13:14:50 Prescription 12/12/2021 -------------------------------------------------------------------------------- [name] Patient Name: Provider: [birth year] [id number] Date of Birth: NPI#[name] [id number] Sex: DEA #: [phone] Phone #: License #: [employer] Patient Address: [address] [place] [address] [place], [place] [address] [place], [place] [address] [phone] Allergies No Known Allergies Provider's Orders If appropriate for treatment, begin HBOT per protocol: Hand Signature: Date(s): Prescription 12/12/2021 [name] Patient Name: Provider: 10/13/1973 [id number] Date of Birth: NPI#: [name] [id number] Sex: DEA #: [phone] Phone #: License #: [place] Patient Address: [address] [place] [address] [place], [place] [address] [place], [place] [address] [phone] Allergies No Known Allergies Provider's Orders 2.0 ATA for 90 Minutes without A Breaks ir Hand Signature: Date(s): Prescription 12/12/2021 [name] Patient Name: Provider: [birth year] [id number] Date of Birth: NPI#: [name] [id number] Sex: DEA #: [phone] Phone #: License #: [place] Patient Address: [address] [place] [address] [place], [place] [address] [place], [place] [address] [phone] Allergies No Known Allergies Provider's Orders One treatments per day (delivered Monday through Friday unless otherwise specified in Special Instructions below): Hand Signature: Date(s): Prescription 12/12/2021 [name] Patient Name: Provider: [birth year] [id number] Date of Birth: NPI#: [name] [id number] Sex: DEA #: [phone] Phone #: License #: [place] Patient Address: [address] [place] [address] [place], [place] [address] [place], [place] [address] [phone] Allergies No Known Allergies Provider's Orders Afrin (Oxymetazoline HCL) 0.05% nasal spray - 1 spray in both nostrils daily as needed prior to HBO treatment for difficulty clearing ears Hand Signature: Date(s): Electronic Signature(s) Signed: 12/12/2021 3:43:54 PM By: [name] Juarez Signed: 12/12/2021 5:08:09 PM By: Deon Pilling RN, BSN Entered By: Deon Pilling on 12/12/2021 13:14:50 -------------------------------------------------------------------------------- Problem List Details Patient Name: Date of Service: [name] Lango, [name] 12/12/2021 3:15 PM Medical Record Number: [id number] Patient Account Number: [id number] Date of Birth/Sex: Treating RN: 04-Dec-1951 (70 y.o. [name] Primary Care Provider: [name] Other Clinician: Referring Provider: Treating Provider/Extender: [name], [name] in Treatment: 8 Active Problems ICD-10 Encounter Code Description Active Date MDM Diagnosis L59.8 Other specified disorders of the skin and subcutaneous tissue related to 10/15/2021 No Yes radiation Z51.0 Encounter for antineoplastic radiation therapy 10/15/2021 No Yes R13.19 Other dysphagia 10/15/2021 No  Yes 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, oral cavity, and 10/15/2021 No Yes pharynx Inactive Problems Resolved Problems Electronic Signature(s) Signed: 12/12/2021 3:43:54 PM By: [name] Juarez Entered By: [name] on 12/12/2021 13:11:22 -------------------------------------------------------------------------------- Progress Note Details Patient Name: Date of Service: [name] L[place], [name] 12/12/2021 3:15 PM Medical Record Number: [id number] Patient Account Number:  MILANO, ROSEVEAR ([id number]) Visit Report for 12/12/2021 HPI Details Patient Name: Date of Service: [place], [name] 12/12/2021 3:15 PM Medical Record Number: [id number] Patient Account Number: [id number] Date of Birth/Sex: Treating RN: Feb 06, 1951 (70 y.o. [name] Primary Care Provider: [name] Other Clinician: Referring Provider: Treating Provider/Extender: [name], [name] in Treatment: 8 History of Present Illness HPI Description: ADMISSION 10/15/2021 Mr. 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 This was Ct3 cN2 HPV positive. He was treated with concomitant 5-FU and radiation6996 cGy over 33 treatments to his bilateral neck and left tonsil. I believe this was at [facility] This was completed on 08/31/2018. Fortunately the patient from a cancer point of view did fairly well. CT scan and PET scans showed resolution of the tumor and metastatic nodes. As far as I am aware he is cancer free HOWEVER roughly a year after his treatment he started to develop tightness in the skin of his left lateral neck predominantly. He states that this is gotten increasingly difficult over the last year and especially over the last several months. He has not been able to swallow and is PEG tube dependent. He has pain and tingling in his left lateral neck and occasionally radiating into the left shoulder and upper arm. The most difficult thing for this patient is his complete inability to swallow. He constantly coughs up thick mucousy sputum. He was followed with [facility] gastroenterology Dr. Gala Romney. 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 He has never regain swallowing function. He uses a PEG tube for feeding and for hydration. The  patient was sent here by [facility] radiation oncology for consideration of hyperbaric oxygen therapy. 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 He also has pain as noted which he describes as an electrical sensation on the left side of his neck radiating into his arm. As noted he also has radiation changes in the hypopharynx and in the esophagus. Past medical history includes hypothyroidism, hypertension, hard of hearing, tonsillar CA, multiple dental extractions in 2019; 12/12/2021 patient has had HBO treatment 17. He is tolerated this well. The patient has radiation induced tenesmus, skin tightening in the neck, dysphagia, dysphonia. So far he is not consistently noted any improvement although all of [place] seem to think his voice is a lot clearer. He still cannot handle swallowing his saliva although he is scared to go through any of this Electronic Signature(s) Signed: 12/12/2021 3:43:54 PM By: [name] Juarez Entered By: [name] on 12/12/2021 13:13:04 -------------------------------------------------------------------------------- Physician Orders Details Patient Name: Date of Service: [place], [place] S. 12/12/2021 3:15 PM Medical Record Number: [id number] Patient Account Number: [id number] Date of Birth/Sex: Treating RN: 07/05/1951 (70 y.o. [name] Primary Care Provider: [name] Other Clinician: Referring Provider: Treating Provider/Extender: [name], [name] in Treatment: 8 Verbal / Phone Orders: No Diagnosis Coding ICD-10 Coding Code Description L59.8 Other specified disorders of the skin and subcutaneous tissue related to radiation Z51.0 Encounter for antineoplastic radiation therapy R13.19 Other dysphagia 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, oral cavity, and pharynx Hyperbaric Oxygen Therapy Indication: - Soft Tissue  Radionecrosis of the neck/pharynx. If  MILANO, ROSEVEAR ([id number]) Visit Report for 12/12/2021 HPI Details Patient Name: Date of Service: [place], [name] 12/12/2021 3:15 PM Medical Record Number: [id number] Patient Account Number: [id number] Date of Birth/Sex: Treating RN: Feb 06, 1951 (70 y.o. [name] Primary Care Provider: [name] Other Clinician: Referring Provider: Treating Provider/Extender: [name], [name] in Treatment: 8 History of Present Illness HPI Description: ADMISSION 10/15/2021 Mr. 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 This was Ct3 cN2 HPV positive. He was treated with concomitant 5-FU and radiation6996 cGy over 33 treatments to his bilateral neck and left tonsil. I believe this was at [facility] This was completed on 08/31/2018. Fortunately the patient from a cancer point of view did fairly well. CT scan and PET scans showed resolution of the tumor and metastatic nodes. As far as I am aware he is cancer free HOWEVER roughly a year after his treatment he started to develop tightness in the skin of his left lateral neck predominantly. He states that this is gotten increasingly difficult over the last year and especially over the last several months. He has not been able to swallow and is PEG tube dependent. He has pain and tingling in his left lateral neck and occasionally radiating into the left shoulder and upper arm. The most difficult thing for this patient is his complete inability to swallow. He constantly coughs up thick mucousy sputum. He was followed with [facility] gastroenterology Dr. Gala Romney. 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 He has never regain swallowing function. He uses a PEG tube for feeding and for hydration. The  patient was sent here by [facility] radiation oncology for consideration of hyperbaric oxygen therapy. 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 He also has pain as noted which he describes as an electrical sensation on the left side of his neck radiating into his arm. As noted he also has radiation changes in the hypopharynx and in the esophagus. Past medical history includes hypothyroidism, hypertension, hard of hearing, tonsillar CA, multiple dental extractions in 2019; 12/12/2021 patient has had HBO treatment 17. He is tolerated this well. The patient has radiation induced tenesmus, skin tightening in the neck, dysphagia, dysphonia. So far he is not consistently noted any improvement although all of [place] seem to think his voice is a lot clearer. He still cannot handle swallowing his saliva although he is scared to go through any of this Electronic Signature(s) Signed: 12/12/2021 3:43:54 PM By: [name] Juarez Entered By: [name] on 12/12/2021 13:13:04 -------------------------------------------------------------------------------- Physician Orders Details Patient Name: Date of Service: [place], [place] S. 12/12/2021 3:15 PM Medical Record Number: [id number] Patient Account Number: [id number] Date of Birth/Sex: Treating RN: 07/05/1951 (70 y.o. [name] Primary Care Provider: [name] Other Clinician: Referring Provider: Treating Provider/Extender: [name], [name] in Treatment: 8 Verbal / Phone Orders: No Diagnosis Coding ICD-10 Coding Code Description L59.8 Other specified disorders of the skin and subcutaneous tissue related to radiation Z51.0 Encounter for antineoplastic radiation therapy R13.19 Other dysphagia 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, oral cavity, and pharynx Hyperbaric Oxygen Therapy Indication: - Soft Tissue  Radionecrosis of the neck/pharynx. If

## 2021-12-12 NOTE — Progress Notes (Signed)
SEKOU, ZUCKERMAN ([id number]) Visit Report for 12/12/2021 SuperBill Details Patient Name: Date of Service: [place], [name] 12/12/2021 Medical Record Number: [id number] Patient Account Number: [id number] Date of Birth/Sex: Treating RN: 1951/02/28 (70 y.o. [name], Tammi Klippel Primary Care Provider: [name] Other Clinician: [name] Referring Provider: Treating Provider/Extender: [name], [name] in Treatment: 8 Diagnosis Coding ICD-10 Codes Code Description L59.8 Other specified disorders of the skin and subcutaneous tissue related to radiation Z51.0 Encounter for antineoplastic radiation therapy R13.19 Other dysphagia 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, oral cavity, and pharynx Facility Procedures The patient participates with Medicare or their insurance follows the Medicare Facility Guidelines CPT4 Code Description Modifier Quantity [id number]-(Facility Use Only) HBOT full body chamber, 42min , 4 ICD-10 Diagnosis Description L59.8 Other specified disorders of the skin and subcutaneous tissue related to radiation Z51.0 Encounter for antineoplastic radiation therapy R13.19 Other dysphagia 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, oral cavity, and pharynx Physician Procedures Quantity CPT4 Code Description Modifier [id number] - WC PHYS HYPERBARIC OXYGEN THERAPY 1 ICD-10 Diagnosis Description L59.8 Other specified disorders of the skin and subcutaneous tissue related to radiation Z51.0 Encounter for antineoplastic radiation therapy R13.19 Other dysphagia 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, oral cavity, and pharynx Electronic Signature(s) Signed: 12/12/2021 3:10:10 PM By: [name] CHT EMT BS , , Signed: 12/12/2021 3:43:54 PM By: [name] MD Entered By: [name] on 12/12/2021 15:10:10

## 2021-12-13 ENCOUNTER — Encounter (HOSPITAL_BASED_OUTPATIENT_CLINIC_OR_DEPARTMENT_OTHER): Payer: Medicare Other | Admitting: Internal Medicine

## 2021-12-17 ENCOUNTER — Encounter (HOSPITAL_BASED_OUTPATIENT_CLINIC_OR_DEPARTMENT_OTHER): Payer: Medicare Other | Admitting: Internal Medicine

## 2021-12-17 ENCOUNTER — Other Ambulatory Visit: Payer: Self-pay | Admitting: Family Medicine

## 2021-12-17 NOTE — Progress Notes (Addendum)
[name], VANBLARCOM ([id number]) Visit Report for 12/12/2021 Arrival Information Details Patient Name: Date of Service: [name], [name] [name] 12/12/2021 1:00 PM Medical Record Number: [id number] Patient Account Number: [id number] Date of Birth/Sex: Treating RN: [birth year] (70 y.o. [name], Meta.Reding Primary Care Keisha Amer: [name] Other Clinician: [name] Referring Alleah Dearman: Treating Triton Heidrich/Extender: Tacey Ruiz in Treatment: 8 Visit Information History Since Last Visit All ordered tests and consults were completed: Yes Patient Arrived: Ambulatory Added or deleted any medications: No Arrival Time: 12:30 Any new allergies or adverse reactions: No Accompanied By: self Had a fall or experienced change in No Transfer Assistance: None activities of daily living that may affect Patient Identification Verified: Yes risk of falls: Secondary Verification Process Completed: Yes Signs or symptoms of abuse/neglect since last visito No Patient Requires Transmission-Based Precautions: No Hospitalized since last visit: No Patient Has Alerts: No Implantable device outside of the clinic excluding No cellular tissue based products placed in the center since last visit: Pain Present Now: No Electronic Signature(s) Signed: 12/12/2021 3:04:09 PM By: [name] CHT EMT BS , , Entered By: [name] on 12/12/2021 15:04:09 -------------------------------------------------------------------------------- Encounter Discharge Information Details Patient Name: Date of Service: [name] Lango, [name] 12/12/2021 1:00 PM Medical Record Number: [id number] Patient Account Number: [id number] Date of Birth/Sex: Treating RN: Oct 08, 1951 (70 y.o. [name] Primary Care Reene Harlacher: [name] Other Clinician: [name] Referring Garnell Phenix: Treating Laithan Conchas/Extender: Tacey Ruiz in Treatment: 8 Encounter Discharge  Information Items Discharge Condition: Stable Ambulatory Status: Ambulatory Discharge Destination: Home Transportation: Private Auto Accompanied By: self Schedule Follow-up Appointment: No Clinical Summary of Care: Electronic Signature(s) Signed: 12/12/2021 3:10:44 PM By: [name] CHT EMT BS , , Entered By: [name] on 12/12/2021 15:10:44 -------------------------------------------------------------------------------- [place] Details Patient Name: Date of Service: [name] Lango, [name] 12/12/2021 1:00 PM Medical Record Number: [id number] Patient Account Number: [id number] Date of Birth/Sex: Treating RN: 29-Jan-1951 (70 y.o. [name] Primary Care Vasil Juhasz: [name] Other Clinician: [name] Referring Jadyn Barge: Treating Kweku Stankey/Extender: Tacey Ruiz in Treatment: 8 Vital Signs Time Taken: 12:58 Temperature (F): 98.4 Height (in): 68 Pulse (bpm): 78 Weight (lbs): 127 Respiratory Rate (breaths/min): 18 Body Mass Index (BMI): 19.3 Blood Pressure (mmHg): 124/50 Reference Range: 80 - 120 mg / dl Electronic Signature(s) Signed: 12/12/2021 3:05:34 PM By: [name] CHT EMT BS , , Entered By: [name] on 12/12/2021 15:05:34

## 2021-12-18 ENCOUNTER — Encounter (HOSPITAL_BASED_OUTPATIENT_CLINIC_OR_DEPARTMENT_OTHER): Payer: Medicare Other | Admitting: Internal Medicine

## 2021-12-18 ENCOUNTER — Other Ambulatory Visit: Payer: Self-pay

## 2021-12-18 DIAGNOSIS — L598 Other specified disorders of the skin and subcutaneous tissue related to radiation: Secondary | ICD-10-CM | POA: Diagnosis not present

## 2021-12-18 NOTE — Progress Notes (Signed)
LORETO, LOESCHER ([id number]) Visit Report for 12/18/2021 SuperBill Details Patient Name: Date of Service: [place], [name] 12/18/2021 Medical Record Number: [id number] Patient Account Number: [id number] Date of Birth/Sex: Treating RN: 04-Jul-1951 (69 y.o. 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, oral cavity, and pharynx Facility Procedures The patient participates with Medicare or their insurance follows the Medicare Facility Guidelines CPT4 Code Description Modifier Quantity [id number] G0277-(Facility Use Only) HBOT full body chamber, 32min , 4 Physician Procedures Quantity CPT4 Code Description Modifier [id number] 79432 - WC PHYS HYPERBARIC OXYGEN THERAPY 1 ICD-10 Diagnosis Description L59.8 Other specified disorders of the skin and subcutaneous tissue related to radiation Z51.0 Encounter for antineoplastic radiation therapy R13.19 Other dysphagia 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

## 2021-12-18 NOTE — Progress Notes (Signed)
KAELIN, BONELLI ([id number]) Visit Report for 12/18/2021 Arrival Information Details Patient Name: Date of Service: [place], [name] 12/18/2021 1:00 PM Medical Record Number: [id number] Patient Account Number: [id number] Date of Birth/Sex: Treating RN: 01/20/1951 (70 y.o. [name] Primary Care Darthy Manganelli: [name] Other Clinician: Referring Dylann Layne: Treating Ceana Fiala/Extender: Tacey Ruiz in Treatment: 9 Visit Information History Since Last Visit Added or deleted any medications: No Patient Arrived: Ambulatory Any new allergies or adverse reactions: No Arrival Time: 12:47 Had a fall or experienced change in No Accompanied By: self activities of daily living that may affect Transfer Assistance: None risk of falls: Patient Identification Verified: Yes Signs or symptoms of abuse/neglect since last visito No Secondary Verification Process Completed: Yes Hospitalized since last visit: No Patient Requires Transmission-Based Precautions: No Implantable device outside of the clinic excluding No Patient Has Alerts: No cellular tissue based products placed in the center since last visit: Pain Present Now: No Electronic Signature(s) Signed: 12/18/2021 3:01:11 PM By: Deon Pilling RN, BSN Entered By: Deon Pilling on 12/18/2021 13:09:07 -------------------------------------------------------------------------------- Encounter Discharge Information Details Patient Name: Date of Service: [name], [name] 12/18/2021 1:00 PM Medical Record Number: [id number] Patient Account Number: [id number] Date of Birth/Sex: Treating RN: Jun 07, 1951 (70 y.o. [name] Primary Care Alvin Rubano: [name] Other Clinician: Referring Apphia Cropley: Treating Shelsea Hangartner/Extender: Tacey Ruiz in Treatment: 9 Encounter Discharge Information Items Discharge Condition: Stable Ambulatory Status: Ambulatory Discharge Destination:  Home Transportation: Private Auto Accompanied By: self Schedule Follow-up Appointment: Yes Clinical Summary of Care: Electronic Signature(s) Signed: 12/18/2021 3:01:11 PM By: Deon Pilling RN, BSN Entered By: Deon Pilling on 12/18/2021 15:00:48 -------------------------------------------------------------------------------- Vitals Details Patient Name: Date of Service: [name], [name] 12/18/2021 1:00 PM Medical Record Number: [id number] Patient Account Number: [id number] Date of Birth/Sex: Treating RN: [birth year] (70 y.o. [name] Primary Care Shrika Milos: [name] Other Clinician: Referring Keandre Linden: Treating Josephanthony Tindel/Extender: [name], [name] in Treatment: 9 Vital Signs Time Taken: 12:47 Temperature (F): 98.1 Height (in): 68 Pulse (bpm): 82 Weight (lbs): 127 Respiratory Rate (breaths/min): 16 Body Mass Index (BMI): 19.3 Blood Pressure (mmHg): 104/57 Reference Range: 80 - 120 mg / dl Electronic Signature(s) Signed: 12/18/2021 3:01:11 PM By: Deon Pilling RN, BSN Entered By: Deon Pilling on 12/18/2021 13:09:34

## 2021-12-18 NOTE — Progress Notes (Signed)
[name], [name] ([id number]) Visit Report for 12/18/2021 HBO Details Patient Name: Date of Service: [name], [name] 12/18/2021 1:00 PM Medical Record Number: [id number] Patient Account Number: [id number] Date of Birth/Sex: Treating RN: September 28, 1951 (71 y.o. 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 HBO Treatment Details Treatment Number: 18 Patient Type: Outpatient Chamber Type: Monoplace Chamber Serial #: [id number] Treatment Protocol: 2.0 ATA with 90 minutes oxygen, and no air breaks Treatment Details Compression Rate Down: 2.0 psi / minute De-Compression Rate Up: 2.0 psi / minute Air breaks and breathing Decompress Decompress Compress Tx Pressure Begins Reached periods Begins Ends (leave unused spaces blank) Chamber Pressure (ATA 1 2 ------2 1 ) Clock Time (24 hr) 13:04 13:11 - - - - - - 14:42 14:50 Treatment Length: 106 (minutes) Treatment Segments: 4 Vital Signs Capillary Blood Glucose Reference Range: 80 - 120 mg / dl HBO Diabetic Blood Glucose Intervention Range: <131 mg/dl or >308 mg/dl Time Vitals Blood Respiratory Capillary Blood Glucose Pulse Action Type: Pulse: Temperature: Taken: Pressure: Rate: Glucose (mg/dl): Meter #: Oximetry (%) Taken: Pre 12:47 104/57 82 16 98.1 Post 14:51 113/65 73 16 98.2 Treatment Response Treatment Toleration: Well Treatment Completion Status: Treatment Completed without Adverse Event Willette Mudry Notes No concerns with treatment given Physician HBO Attestation: I certify that I supervised this HBO treatment in accordance with Medicare guidelines. A trained emergency response team  is readily available per Yes hospital policies and procedures. Continue HBOT as ordered. Yes Electronic Signature(s) Signed: 12/18/2021 3:46:55 PM By: [name] MD Previous Signature: 12/18/2021 3:01:11 PM Version By: Shawn Stall RN, BSN Entered By: [name] on 12/18/2021 15:31:12 -------------------------------------------------------------------------------- HBO Safety Checklist Details Patient Name: Date of Service: [name], [name] [name] 12/18/2021 1:00 PM Medical Record Number: [id number] Patient Account Number: [id number] Date of Birth/Sex: Treating RN: 02-25-1951 (70 y.o. [name], [name] Primary Care Alailah Safley: [name] Other Clinician: Referring Beda Dula: Treating Teka Chanda/Extender: Standley Brooking, [place] Planas in Treatment: 9 HBO Safety Checklist Items Safety Checklist Consent Form Signed Patient voided / foley secured and emptied When did you last eato 1045; tube feeding via PEG Tube Last dose of injectable or oral agent n/a Ostomy pouch emptied and vented if applicable NA All implantable devices assessed, documented and approved NA Intravenous access site secured and place NA Valuables secured Linens and cotton and cotton/polyester blend (less than 51% polyester) Personal oil-based products / skin lotions / body lotions removed Wigs or hairpieces removed NA Smoking or tobacco materials removed Books / newspapers / magazines / loose paper removed Cologne, aftershave, perfume and deodorant removed Jewelry removed (may wrap wedding band) Make-up removed NA Hair care products removed NA Battery operated devices (external) removed NA Heating patches and chemical warmers removed NA Titanium eyewear removed NA Nail polish cured greater than 10 hours NA Casting material cured greater than 10 hours NA Hearing aids removed NA Loose dentures or partials removed NA Prosthetics have been removed NA Patient demonstrates correct use  of air break device (if applicable) Patient concerns have been addressed Patient grounding bracelet on and cord attached to chamber Specifics for Inpatients (complete in addition to above) Medication sheet sent with patient Intravenous medications needed or due during therapy sent with patient Drainage tubes (e.g. nasogastric tube or chest tube  secured and vented) Endotracheal or Tracheotomy tube secured Cuff deflated of air and inflated with saline Airway suctioned Electronic Signature(s) Signed: 12/18/2021 3:01:11 PM By: Shawn Stall RN, BSN Entered By: Shawn Stall on 12/18/2021 13:12:19

## 2021-12-19 ENCOUNTER — Encounter (HOSPITAL_BASED_OUTPATIENT_CLINIC_OR_DEPARTMENT_OTHER): Payer: Medicare Other | Admitting: Internal Medicine

## 2021-12-19 DIAGNOSIS — L598 Other specified disorders of the skin and subcutaneous tissue related to radiation: Secondary | ICD-10-CM | POA: Diagnosis not present

## 2021-12-19 NOTE — Progress Notes (Signed)
KARANVEER, RAMAKRISHNAN ([id number]) Visit Report for 12/19/2021 SuperBill Details Patient Name: Date of Service: [place], [name] 12/19/2021 Medical Record Number: [id number] Patient Account Number: [id number] Date of Birth/Sex: Treating RN: 11/07/1951 (70 y.o. 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, oral cavity, and pharynx Facility Procedures The patient participates with Medicare or their insurance follows the Medicare Facility Guidelines CPT4 Code Description Modifier Quantity [id number] G0277-(Facility Use Only) HBOT full body chamber, 64min , 4 ICD-10 Diagnosis Description L59.8 Other specified disorders of the skin and subcutaneous tissue related to radiation Z51.0 Encounter for antineoplastic radiation therapy R13.19 Other dysphagia 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, oral cavity, and pharynx Physician Procedures Quantity CPT4 Code Description Modifier [id number] 33354 - WC PHYS HYPERBARIC OXYGEN THERAPY 1 ICD-10 Diagnosis Description L59.8 Other specified disorders of the skin and subcutaneous tissue related to radiation Z51.0 Encounter for antineoplastic radiation therapy R13.19 Other dysphagia 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

## 2021-12-19 NOTE — Progress Notes (Addendum)
ESHAWN, OHAGAN ([id number]) Visit Report for 12/19/2021 HBO Details Patient Name: Date of Service: [name], [name] 12/19/2021 1:00 PM Medical Record Number: [id number] Patient Account Number: [id number] Date of Birth/Sex: Treating RN: 12-Mar-1951 (70 y.o. 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 HBO Treatment Details Treatment Number: 19 Patient Type: Outpatient Chamber Type: Monoplace Chamber Serial #: [id number] Treatment Protocol: 2.0 ATA with 90 minutes oxygen, and no air breaks Treatment Details Compression Rate Down: 2.0 psi / minute De-Compression Rate Up: 2.0 psi / minute Air breaks and breathing Decompress Decompress Compress Tx Pressure Begins Reached periods Begins Ends (leave unused spaces blank) Chamber Pressure (ATA 1 2 ------2 1 ) Clock Time (24 hr) 12:50 12:59 - - - - - - 14:39 14:45 Treatment Length: 115 (minutes) Treatment Segments: 4 Vital Signs Capillary Blood Glucose Reference Range: 80 - 120 mg / dl HBO Diabetic Blood Glucose Intervention Range: <131 mg/dl or >474 mg/dl Time Vitals Blood Respiratory Capillary Blood Glucose Pulse Action Type: Pulse: Temperature: Taken: Pressure: Rate: Glucose (mg/dl): Meter #: Oximetry (%) Taken: Pre 12:43 108/60 80 16 98.1 Post 14:48 106/57 73 16 98.4 Treatment Response Treatment Toleration: Well Treatment Completion Status: Treatment Completed without Adverse Event Treatment Notes Information entered from paper intake sheet. Patient treatment was uneventful. [place] Cloyce Paterson Notes No concerns with treatment given Physician HBO Attestation: I  certify that I supervised this HBO treatment in accordance with Medicare guidelines. A trained emergency response team is readily available per Yes hospital policies and procedures. Continue HBOT as ordered. Yes Electronic Signature(s) Signed: 12/20/2021 10:24:37 AM By: Shearon Stalls Signed: 12/24/2021 3:57:23 PM By: [name] MD Previous Signature: 12/19/2021 5:16:25 PM Version By: [name] MD Previous Signature: 12/19/2021 2:58:46 PM Version By: [name] EMT Entered By: Shearon Stalls on 12/20/2021 10:24:36 -------------------------------------------------------------------------------- HBO Safety Checklist Details Patient Name: Date of Service: [name], [name] [name] 12/19/2021 1:00 PM Medical Record Number: [id number] Patient Account Number: [id number] Date of Birth/Sex: Treating RN: 02-Jun-1951 (70 y.o. Judie Petit) Shearon Stalls Primary Care Andersen Iorio: [name] Other Clinician: [name] Referring Kalik Hoare: Treating Keyonna Comunale/Extender: [name] in Treatment: 9 HBO Safety Checklist Items Safety Checklist Consent Form Signed Patient voided / foley secured and emptied When did you last eato 0800 By peg tube Last dose of injectable or oral agent NA Ostomy pouch emptied and vented if applicable NA All implantable devices assessed, documented and approved NA Intravenous access site secured and place NA Valuables secured Linens and cotton and cotton/polyester blend (less than 51% polyester) Personal oil-based products / skin lotions / body lotions removed Wigs or hairpieces removed NA Smoking or tobacco materials removed Books / newspapers / magazines / loose paper removed Cologne, aftershave, perfume and deodorant removed Jewelry removed (may wrap wedding band) Make-up removed NA Hair care products removed Battery operated devices (external) removed Heating patches and chemical warmers removed Titanium eyewear  removed NA Nail polish cured greater than 10 hours NA Casting material cured greater than 10 hours NA Hearing aids removed NA Loose dentures or partials removed NA Prosthetics have been removed NA Patient demonstrates correct use of air break device (if applicable) Patient concerns have been addressed Patient grounding bracelet on and cord attached to chamber Specifics for Inpatients (  complete in addition to above) Medication sheet sent with patient NA Intravenous medications needed or due during therapy sent with patient NA Drainage tubes (e.g. nasogastric tube or chest tube secured and vented) NA Endotracheal or Tracheotomy tube secured NA Cuff deflated of air and inflated with saline NA Airway suctioned NA Electronic Signature(s) Signed: 12/19/2021 2:56:47 PM By: [name] EMT Entered By: [name] on 12/19/2021 14:56:47

## 2021-12-20 ENCOUNTER — Other Ambulatory Visit: Payer: Self-pay

## 2021-12-20 ENCOUNTER — Encounter (HOSPITAL_BASED_OUTPATIENT_CLINIC_OR_DEPARTMENT_OTHER): Payer: Medicare Other | Admitting: Internal Medicine

## 2021-12-20 DIAGNOSIS — L598 Other specified disorders of the skin and subcutaneous tissue related to radiation: Secondary | ICD-10-CM | POA: Diagnosis not present

## 2021-12-20 NOTE — Progress Notes (Addendum)
[name], [name] ([id number]) Visit Report for 12/20/2021 SuperBill Details Patient Name: Date of Service: [place], [name] 12/20/2021 Medical Record Number: [id number] Patient Account Number: [id number] Date of Birth/Sex: Treating RN: 03/28/1951 (70 y.o. 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, oral cavity, and pharynx Facility Procedures The patient participates with Medicare or their insurance follows the Medicare Facility Guidelines CPT4 Code Description Modifier Quantity [id number] G0277-(Facility Use Only) HBOT full body chamber, 43min , 4 Physician Procedures Quantity CPT4 Code Description Modifier [id number] 57897 - WC PHYS HYPERBARIC OXYGEN THERAPY 1 ICD-10 Diagnosis Description L59.8 Other specified disorders of the skin and subcutaneous tissue related to radiation Z51.0 Encounter for antineoplastic radiation therapy Electronic Signature(s) Signed: 12/24/2021 3:24:12 PM By: Kalman Shan DO Signed: 12/24/2021 5:47:12 PM By: Levan Hurst RN, BSN Previous Signature: 12/20/2021 1:50:05 PM Version By: Kalman Shan DO Previous Signature: 12/20/2021 2:54:09 PM Version By: Levan Hurst RN, BSN Entered By: Levan Hurst on 12/24/2021 14:46:03

## 2021-12-20 NOTE — Progress Notes (Addendum)
[name], Juarez ([id number]) Visit Report for 12/19/2021 Arrival Information Details Patient Name: Date of Service: [place], [name] 12/19/2021 1:00 PM Medical Record Number: [id number] Patient Account Number: [id number] Date of Birth/Sex: Treating RN: [birth year] (70 y.o. M) Primary Care Dieter Hane: [name] Other Clinician: Referring Felis Quillin: Treating Malka Bocek/Extender: [name], [name] in Treatment: 9 Visit Information History Since Last Visit All ordered tests and consults were completed: Yes Patient Arrived: Ambulatory Added or deleted any medications: No Arrival Time: 12:25 Any new allergies or adverse reactions: No Accompanied By: None Had a fall or experienced change in No Transfer Assistance: None activities of daily living that may affect Patient Identification Verified: Yes risk of falls: Secondary Verification Process Completed: Yes Signs or symptoms of abuse/neglect since last visito No Patient Requires Transmission-Based Precautions: No Hospitalized since last visit: No Patient Has Alerts: No Implantable device outside of the clinic excluding No cellular tissue based products placed in the center since last visit: Pain Present Now: No Electronic Signature(s) Signed: 12/19/2021 2:54:40 PM By: [name] Batman EMT Entered By: [name] Batman on 12/19/2021 14:54:40 -------------------------------------------------------------------------------- Encounter Discharge Information Details Patient Name: Date of Service: [name] Lango, [name] 12/19/2021 1:00 PM Medical Record Number: [id number] Patient Account Number: [id number] Date of Birth/Sex: Treating RN: 09/07/1951 (70 y.o. [name]) Maye Hides Primary Care Vashon Riordan: [name] Other Clinician: [name] Batman Referring Salvador Bigbee: Treating Dulce Martian/Extender: Tacey Ruiz in Treatment: 9 Encounter Discharge Information Items Discharge Condition: Stable Ambulatory  Status: Ambulatory Discharge Destination: Home Transportation: Private Auto Accompanied By: None Schedule Follow-up Appointment: Yes Clinical Summary of Care: Electronic Signature(s) Signed: 12/19/2021 3:01:34 PM By: [name] Batman EMT Entered By: [name] Batman on 12/19/2021 15:01:34 -------------------------------------------------------------------------------- Patient/Caregiver Education Details Patient Name: Date of Service: [name], [name] 12/29/2022andnbsp1:00 PM Medical Record Number: [id number] Patient Account Number: [id number] Date of Birth/Gender: Treating RN: 1951/10/16 (70 y.o. [name]) Maye Hides Primary Care Physician: [name] Other Clinician: [name] Batman Referring Physician: Treating Physician/Extender: Tacey Ruiz in Treatment: 9 Education Assessment Education Provided To: Patient Education Topics Provided Electronic Signature(s) Signed: 12/19/2021 3:00:35 PM By: [name] Batman EMT Entered By: [name] Batman on 12/19/2021 15:00:35 -------------------------------------------------------------------------------- Vitals Details Patient Name: Date of Service: [name] Lango, [name] 12/19/2021 1:00 PM Medical Record Number: [id number] Patient Account Number: [id number] Date of Birth/Sex: Treating RN: 01-26-1951 (70 y.o. [name]) Maye Hides Primary Care Chai Routh: [name] Other Clinician: [name] Batman Referring Tarl Cephas: Treating Jeryn Bertoni/Extender: [name], Tiffany Weeks in Treatment: 9 Vital Signs Time Taken: 12:43 Temperature (F): 98.1 Height (in): 68 Pulse (bpm): 80 Weight (lbs): 127 Respiratory Rate (breaths/min): 16 Body Mass Index (BMI): 19.3 Blood Pressure (mmHg): 108/60 Reference Range: 80 - 120 mg / dl Electronic Signature(s) Signed: 12/19/2021 2:55:19 PM By: [name] Batman EMT Entered By: [name] Batman on 12/19/2021 14:55:18

## 2021-12-20 NOTE — Progress Notes (Addendum)
ROGER, ZARR ([id number]) Visit Report for 12/20/2021 HBO Details Patient Name: Date of Service: [place], [place] 12/20/2021 11:30 A M Medical Record Number: [id number] Patient Account Number: [id number] Date of Birth/Sex: Treating RN: 1951/10/22 (70 y.o. 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 HBO Treatment Details Treatment Number: 20 Patient Type: Outpatient Chamber Type: Monoplace Chamber Serial #: [id number] Treatment Protocol: 2.0 ATA with 90 minutes oxygen, and no air breaks Treatment Details Compression Rate Down: 2.0 psi / minute De-Compression Rate Up: 2.0 psi / minute Air breaks and breathing Decompress Decompress Compress Tx Pressure Begins Reached periods Begins Ends (leave unused spaces blank) Chamber Pressure (ATA 1 2 ------2 1 ) Clock Time (24 hr) 12:10 12:18 - - - - - - 13:48 13:56 Treatment Length: 106 (minutes) Treatment Segments: 4 Vital Signs Capillary Blood Glucose Reference Range: 80 - 120 mg / dl HBO Diabetic Blood Glucose Intervention Range: <131 mg/dl or >202 mg/dl Time Vitals Blood Respiratory Capillary Blood Glucose Pulse Action Type: Pulse: Temperature: Taken: Pressure: Rate: Glucose (mg/dl): Meter #: Oximetry (%) Taken: Pre 12:00 108/50 76 16 98.4 Post 13:40 104/54 67 16 98.2 Treatment Response Treatment Completion Status: Treatment Completed without Adverse Event Physician HBO Attestation: I certify that I supervised this HBO treatment in accordance with Medicare guidelines. A trained emergency response team is readily available per Yes hospital policies and procedures. Continue  HBOT as ordered. Yes Electronic Signature(s) Signed: 12/24/2021 3:24:12 PM By: Geralyn [name] DO Signed: 12/24/2021 5:47:12 PM By: Zandra Abts RN, BSN Previous Signature: 12/20/2021 1:50:05 PM Version By: Geralyn [name] DO Entered By: Zandra Abts on 12/24/2021 14:45:18 -------------------------------------------------------------------------------- HBO Safety Checklist Details Patient Name: Date of Service: [place], [name] 12/20/2021 11:30 A M Medical Record Number: [id number] Patient Account Number: [id number] Date of Birth/Sex: Treating RN: [birth year] (70 y.o. [name] Primary Care Arrian Manson: [name] Other Clinician: Referring Sister Carbone: Treating Kynnadi Dicenso/Extender: Cristopher Peru Weeks in Treatment: 9 HBO Safety Checklist Items Safety Checklist Consent Form Signed Patient voided / foley secured and emptied When did you last eato 0730 Last dose of injectable or oral agent NA Ostomy pouch emptied and vented if applicable NA All implantable devices assessed, documented and approved NA Intravenous access site secured and place NA Valuables secured Linens and cotton and cotton/polyester blend (less than 51% polyester) Personal oil-based products / skin lotions / body lotions removed Wigs or hairpieces removed Smoking or tobacco materials removed Books / newspapers / magazines / loose paper removed Cologne, aftershave, perfume and deodorant removed Jewelry removed (may wrap wedding band) Make-up removed NA Hair care products removed Battery operated devices (external) removed Heating patches and chemical warmers removed Titanium eyewear removed Nail polish cured greater than 10 hours NA Casting material cured greater than 10 hours NA Hearing aids removed Loose dentures or partials removed Prosthetics have been removed NA Patient demonstrates correct use of air break device (if applicable) Patient concerns have been  addressed Patient grounding bracelet on and cord attached to chamber Specifics for Inpatients (complete in addition to above) Medication sheet sent with patient NA Intravenous medications needed or due during therapy sent with patient NA Drainage tubes (e.g. nasogastric tube or chest tube secured and vented) NA Endotracheal or Tracheotomy tube secured NA  Cuff deflated of air and inflated with saline NA Airway suctioned NA Electronic Signature(s) Signed: 12/20/2021 2:54:09 PM By: Zandra Abts RN, BSN Entered By: Zandra Abts on 12/20/2021 13:34:41

## 2021-12-20 NOTE — Progress Notes (Signed)
ZYHEIR, DAFT ([id number]) Visit Report for 12/20/2021 Arrival Information Details Patient Name: Date of Service: [place], [name] 12/20/2021 11:30 A M Medical Record Number: [id number] Patient Account Number: [id number] Date of Birth/Sex: Treating RN: August 02, 1951 (71 y.o. [name] Primary Care Wetona Viramontes: [name] Other Clinician: Referring Roy Snuffer: Treating [place] Rico/Extender: [name] in Treatment: 9 Visit Information History Since Last Visit Added or deleted any medications: No Patient Arrived: Ambulatory Any new allergies or adverse reactions: No Arrival Time: 12:00 Had a fall or experienced change in No Accompanied By: alone activities of daily living that may affect Transfer Assistance: None risk of falls: Patient Identification Verified: Yes Signs or symptoms of abuse/neglect since last visito No Secondary Verification Process Completed: Yes Hospitalized since last visit: No Patient Requires Transmission-Based Precautions: No Implantable device outside of the clinic excluding No Patient Has Alerts: No cellular tissue based products placed in the center since last visit: Pain Present Now: No Electronic Signature(s) Signed: 12/20/2021 2:54:09 PM By: Levan Hurst RN, BSN Entered By: Levan Hurst on 12/20/2021 13:31:10 -------------------------------------------------------------------------------- Vitals Details Patient Name: Date of Service: [name], [name] 12/20/2021 11:30 A M Medical Record Number: [id number] Patient Account Number: [id number] Date of Birth/Sex: Treating RN: [birth year] (70 y.o. [name] Primary Care Breyton Vanscyoc: [name] Other Clinician: Referring Tsuneo Faison: Treating Geovanny Sartin/Extender: [name] Weeks in Treatment: 9 Vital Signs Time Taken: 12:00 Temperature (F): 98.4 Height (in): 68 Pulse (bpm): 76 Weight (lbs): 127 Respiratory Rate (breaths/min):  16 Body Mass Index (BMI): 19.3 Blood Pressure (mmHg): 108/50 Reference Range: 80 - 120 mg / dl Electronic Signature(s) Signed: 12/20/2021 2:54:09 PM By: Levan Hurst RN, BSN Entered By: Levan Hurst on 12/20/2021 13:31:32

## 2021-12-24 ENCOUNTER — Other Ambulatory Visit: Payer: Self-pay

## 2021-12-24 ENCOUNTER — Encounter (HOSPITAL_BASED_OUTPATIENT_CLINIC_OR_DEPARTMENT_OTHER): Payer: Medicare Other | Attending: Internal Medicine | Admitting: Internal Medicine

## 2021-12-24 DIAGNOSIS — Z510 Encounter for antineoplastic radiation therapy: Secondary | ICD-10-CM | POA: Diagnosis not present

## 2021-12-24 DIAGNOSIS — Z85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, oral cavity, and pharynx: Secondary | ICD-10-CM | POA: Diagnosis not present

## 2021-12-24 DIAGNOSIS — R1319 Other dysphagia: Secondary | ICD-10-CM | POA: Diagnosis not present

## 2021-12-24 DIAGNOSIS — L598 Other specified disorders of the skin and subcutaneous tissue related to radiation: Secondary | ICD-10-CM | POA: Insufficient documentation

## 2021-12-24 NOTE — Progress Notes (Signed)
RASHED, EDLER ([id number]) Visit Report for 12/24/2021 SuperBill Details Patient Name: Date of Service: [place], [name] 12/24/2021 Medical Record Number: [id number] Patient Account Number: [id number] Date of Birth/Sex: Treating RN: [birth year] (71 y.o. 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, oral cavity, and pharynx Facility Procedures The patient participates with Medicare or their insurance follows the Medicare Facility Guidelines CPT4 Code Description Modifier Quantity [id number] G0277-(Facility Use Only) HBOT full body chamber, 63min , 4 ICD-10 Diagnosis Description L59.8 Other specified disorders of the skin and subcutaneous tissue related to radiation Z51.0 Encounter for antineoplastic radiation therapy R13.19 Other dysphagia 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, oral cavity, and pharynx Physician Procedures Quantity CPT4 Code Description Modifier [id number] - WC PHYS HYPERBARIC OXYGEN THERAPY 1 ICD-10 Diagnosis Description L59.8 Other specified disorders of the skin and subcutaneous tissue related to radiation Z51.0 Encounter for antineoplastic radiation therapy R13.19 Other dysphagia 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

## 2021-12-24 NOTE — Progress Notes (Addendum)
[name], [name] ([id number]) Visit Report for 12/24/2021 HBO Details Patient Name: Date of Service: [place], [name]. 12/24/2021 8:00 A M Medical Record Number: [id number] Patient Account Number: [id number] Date of Birth/Sex: Treating RN: 1951/06/05 (71 y.o. 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 HBO Treatment Details Treatment Number: 21 Patient Type: Outpatient Chamber Type: Monoplace Chamber Serial #: [id number] Treatment Protocol: 2.0 ATA with 90 minutes oxygen, and no air breaks Treatment Details Compression Rate Down: 2.0 psi / minute De-Compression Rate Up: 2.0 psi / minute Air breaks and breathing Decompress Decompress Compress Tx Pressure Begins Reached periods Begins Ends (leave unused spaces blank) Chamber Pressure (ATA 1 2 ------2 1 ) Clock Time (24 hr) 08:30 08:38 - - - - - - 10:08 10:16 Treatment Length: 106 (minutes) Treatment Segments: 4 Vital Signs Capillary Blood Glucose Reference Range: 80 - 120 mg / dl HBO Diabetic Blood Glucose Intervention Range: <131 mg/dl or >086 mg/dl Time Vitals Blood Respiratory Capillary Blood Glucose Pulse Action Type: Pulse: Temperature: Taken: Pressure: Rate: Glucose (mg/dl): Meter #: Oximetry (%) Taken: Pre 08:23 118/57 79 20 98.2 Post 10:22 106/60 70 18 98.5 Treatment Response Treatment Toleration: Well Treatment Completion Status: Treatment Completed without Adverse Event Aidian Salomon Notes No concerns with treatment given Physician HBO Attestation: I certify that I supervised this HBO treatment in accordance with Medicare guidelines. A trained  emergency response team is readily available per Yes hospital policies and procedures. Continue HBOT as ordered. Yes Electronic Signature(s) Signed: 12/24/2021 3:57:23 PM By: [name] MD Previous Signature: 12/24/2021 11:25:56 AM Version By: Haywood Pao CHT EMT BS , , Entered By: [name] on 12/24/2021 14:59:21 -------------------------------------------------------------------------------- HBO Safety Checklist Details Patient Name: Date of Service: [place], [name] 12/24/2021 8:00 A M Medical Record Number: [id number] Patient Account Number: [id number] Date of Birth/Sex: Treating RN: 29-Jun-1951 (71 y.o. [name] Primary Care Rasheen Schewe: [name] Other Clinician: Haywood Pao Referring Geovannie Vilar: Treating Ardena Gangl/Extender: Standley Brooking, [place] Planas in Treatment: 10 HBO Safety Checklist Items Safety Checklist Consent Form Signed Patient voided / foley secured and emptied When did you last eato 0645 Last dose of injectable or oral agent n/a Ostomy pouch emptied and vented if applicable NA All implantable devices assessed, documented and approved NA Intravenous access site secured and place NA Valuables secured Linens and cotton and cotton/polyester blend (less than 51% polyester) Personal oil-based products / skin lotions / body lotions removed Wigs or hairpieces removed NA Smoking or tobacco materials removed Books / newspapers / magazines / loose paper removed Cologne, aftershave, perfume and deodorant removed Jewelry removed (may wrap wedding band) Make-up removed NA Hair care products removed Battery operated devices (external) removed Heating patches and chemical warmers removed Titanium eyewear removed NA Not wearing eyewear in chamber (nickel) Nail polish cured greater than 10 hours NA Casting material cured greater than 10 hours NA Hearing aids removed NA Loose dentures or partials removed NA Prosthetics have  been removed NA Patient demonstrates correct use of air break device (if applicable) Patient concerns have been addressed Patient grounding bracelet on and cord attached to chamber Specifics for Inpatients (complete in addition to above) Medication sheet sent with patient NA Intravenous medications needed or due during therapy sent with patient  NA Drainage tubes (e.g. nasogastric tube or chest tube secured and vented) NA Endotracheal or Tracheotomy tube secured NA Cuff deflated of air and inflated with saline NA Airway suctioned NA Notes Paper version used prior to treatment. Electronic Signature(s) Signed: 12/24/2021 11:24:00 AM By: Haywood Pao CHT EMT BS , , Entered By: Haywood Pao on 12/24/2021 11:23:59

## 2021-12-24 NOTE — Progress Notes (Addendum)
[name], [name] ([id number]) Visit Report for 12/24/2021 Arrival Information Details Patient Name: Date of Service: [place], [name] 12/24/2021 8:00 A M Medical Record Number: [id number] Patient Account Number: [id number] Date of Birth/Sex: Treating RN: 1951-12-13 (71 y.o. [name] Primary Care [name]: [name] Other Clinician: Donavan Juarez Referring [name]: Treating [name]/Extender: [name] in Treatment: 10 Visit Information History Since Last Visit All ordered tests and consults were completed: Yes Patient Arrived: Ambulatory Added or deleted any medications: No Arrival Time: 08:17 Any new allergies or adverse reactions: No Accompanied By: self Had a fall or experienced change in No Transfer Assistance: None activities of daily living that may affect Patient Identification Verified: Yes risk of falls: Secondary Verification Process Completed: Yes Signs or symptoms of abuse/neglect since last visito No Patient Requires Transmission-Based Precautions: No Hospitalized since last visit: No Patient Has Alerts: No Implantable device outside of the clinic excluding No cellular tissue based products placed in the center since last visit: Pain Present Now: No Electronic Signature(s) Signed: 12/24/2021 11:20:46 AM By: [name] CHT EMT BS , , Entered By: [name] on 12/24/2021 11:20:45 -------------------------------------------------------------------------------- Encounter Discharge Information Details Patient Name: Date of Service: [name] [place], [name] 12/24/2021 8:00 A M Medical Record Number: [id number] Patient Account Number: [id number] Date of Birth/Sex: Treating RN: 06-08-1951 (71 y.o. [name] Primary Care Toiya Morrish: [name] Other Clinician: Donavan Juarez Referring Malacai Grantz: Treating Delbra Zellars/Extender: [name] in Treatment: 10 Encounter Discharge  Information Items Discharge Condition: Stable Ambulatory Status: Ambulatory Discharge Destination: Home Transportation: Private Auto Accompanied By: self Schedule Follow-up Appointment: No Clinical Summary of Care: Electronic Signature(s) Signed: 12/24/2021 11:27:21 AM By: [name] CHT EMT BS , , Entered By: [name] on 12/24/2021 11:27:21 -------------------------------------------------------------------------------- Vitals Details Patient Name: Date of Service: [name], [name] 12/24/2021 8:00 A M Medical Record Number: [id number] Patient Account Number: [id number] Date of Birth/Sex: Treating RN: 12-20-1951 (71 y.o. [name] Primary Care Rett Stehlik: [name] Other Clinician: Donavan Juarez Referring Akiera Allbaugh: Treating Zaviyar Rahal/Extender: [name] in Treatment: 10 Vital Signs Time Taken: 08:23 Temperature (F): 98.2 Height (in): 68 Pulse (bpm): 79 Weight (lbs): 127 Respiratory Rate (breaths/min): 20 Body Mass Index (BMI): 19.3 Blood Pressure (mmHg): 118/57 Reference Range: 80 - 120 mg / dl Electronic Signature(s) Signed: 12/24/2021 11:22:03 AM By: [name] CHT EMT BS , , Entered By: [name] on 12/24/2021 11:22:03

## 2021-12-25 ENCOUNTER — Encounter (HOSPITAL_BASED_OUTPATIENT_CLINIC_OR_DEPARTMENT_OTHER): Payer: Medicare Other | Admitting: Physician Assistant

## 2021-12-25 DIAGNOSIS — Z85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, oral cavity, and pharynx: Secondary | ICD-10-CM | POA: Diagnosis not present

## 2021-12-25 DIAGNOSIS — L598 Other specified disorders of the skin and subcutaneous tissue related to radiation: Secondary | ICD-10-CM | POA: Diagnosis not present

## 2021-12-25 DIAGNOSIS — Z510 Encounter for antineoplastic radiation therapy: Secondary | ICD-10-CM | POA: Diagnosis not present

## 2021-12-25 DIAGNOSIS — R1319 Other dysphagia: Secondary | ICD-10-CM | POA: Diagnosis not present

## 2021-12-25 NOTE — Progress Notes (Signed)
[name], [name] ([id number]) Visit Report for 12/25/2021 SuperBill Details Patient Name: Date of Service: [place], [name] 12/25/2021 Medical Record Number: [id number] Patient Account Number: [id number] Date of Birth/Sex: Treating RN: 04-17-1951 (71 y.o. 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, oral cavity, and pharynx Facility Procedures The patient participates with Medicare or their insurance follows the Medicare Facility Guidelines CPT4 Code Description Modifier Quantity [id number] G0277-(Facility Use Only) HBOT full body chamber, 80min , 4 ICD-10 Diagnosis Description L59.8 Other specified disorders of the skin and subcutaneous tissue related to radiation Z51.0 Encounter for antineoplastic radiation therapy R13.19 Other dysphagia 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, oral cavity, and pharynx Physician Procedures Quantity CPT4 Code Description Modifier [id number] 68341 - WC PHYS HYPERBARIC OXYGEN THERAPY 1 ICD-10 Diagnosis Description L59.8 Other specified disorders of the skin and subcutaneous tissue related to radiation Z51.0 Encounter for antineoplastic radiation therapy R13.19 Other dysphagia 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

## 2021-12-25 NOTE — Progress Notes (Signed)
[name], [name] ([id number]) Visit Report for 12/25/2021 Arrival Information Details Patient Name: Date of Service: [name], [name] 12/25/2021 10:00 A M Medical Record Number: [id number] Patient Account Number: [id number] Date of Birth/Sex: Treating RN: 1951-07-06 (71 y.o. [name], [name] Primary Care Burley Kop[place]: [name] Other Clinician: [name] Referring Jani Ploeger: Treating Jynesis Nakamura/Extender: Alinda Dooms, Tiffany Weeks in Treatment: 10 Visit Information History Since Last Visit All ordered tests and consults were completed: Yes Patient Arrived: Ambulatory Added or deleted any medications: No Arrival Time: 09:52 Any new allergies or adverse reactions: No Accompanied By: self Had a fall or experienced change in No Transfer Assistance: None activities of daily living that may affect Patient Identification Verified: Yes risk of falls: Secondary Verification Process Completed: Yes Signs or symptoms of abuse/neglect since last visito No Patient Requires Transmission-Based Precautions: No Hospitalized since last visit: No Patient Has Alerts: No Implantable device outside of the clinic excluding No cellular tissue based products placed in the center since last visit: Pain Present Now: No Electronic Signature(s) Signed: 12/25/2021 2:47:09 PM By: [name] CHT EMT BS , , Entered By: [name] on 12/25/2021 14:47:08 -------------------------------------------------------------------------------- Encounter Discharge Information Details Patient Name: Date of Service: [name], [name] 12/25/2021 10:00 A M Medical Record Number: [id number] Patient Account Number: [id number] Date of Birth/Sex: Treating RN: 03-12-1951 (71 y.o. [name] Primary Care [name]: [name] Other Clinician: [name] Referring Barlow Harrison: Treating Keva Darty/Extender: Alinda Dooms, Tiffany Weeks in Treatment: 10 Encounter Discharge  Information Items Discharge Condition: Stable Ambulatory Status: Ambulatory Discharge Destination: Home Transportation: Private Auto Accompanied By: self Schedule Follow-up Appointment: No Clinical Summary of Care: Electronic Signature(s) Signed: 12/25/2021 3:04:23 PM By: [name] CHT EMT BS , , Entered By: [name] on 12/25/2021 15:04:23 -------------------------------------------------------------------------------- [place] Details Patient Name: Date of Service: [name], [name] 12/25/2021 10:00 A M Medical Record Number: [id number] Patient Account Number: [id number] Date of Birth/Sex: Treating RN: 01/08/1951 (71 y.o. [name] Primary Care Levy Cedano: [name] Other Clinician: [name] Referring Amare Bail: Treating Dewey Neukam/Extender: Alinda Dooms, Tiffany Weeks in Treatment: 10 Vital Signs Time Taken: 10:31 Temperature (F): 98.5 Height (in): 68 Pulse (bpm): 91 Weight (lbs): 127 Respiratory Rate (breaths/min): 20 Body Mass Index (BMI): 19.3 Blood Pressure (mmHg): 117/71 Reference Range: 80 - 120 mg / dl Electronic Signature(s) Signed: 12/25/2021 2:47:33 PM By: [name] CHT EMT BS , , Entered By: [name] on 12/25/2021 14:47:32

## 2021-12-25 NOTE — Progress Notes (Addendum)
[name], [place] ([id number]) Visit Report for 12/25/2021 HBO Details Patient Name: Date of Service: [place], [name]. 12/25/2021 10:00 A M Medical Record Number: [id number] Patient Account Number: [id number] Date of Birth/Sex: Treating RN: Nov 06, 1951 (71 y.o. 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 HBO Treatment Details Treatment Number: 22 Patient Type: Outpatient Chamber Type: Monoplace Chamber Serial #: [id number] Treatment Protocol: 2.0 ATA with 90 minutes oxygen, and no air breaks Treatment Details Compression Rate Down: 2.0 psi / minute De-Compression Rate Up: 2.0 psi / minute Air breaks and breathing Decompress Decompress Compress Tx Pressure Begins Reached periods Begins Ends (leave unused spaces blank) Chamber Pressure (ATA 1 2 ------2 1 ) Clock Time (24 hr) 10:33 10:41 - - - - - - 12:11 12:19 Treatment Length: 106 (minutes) Treatment Segments: 4 Vital Signs Capillary Blood Glucose Reference Range: 80 - 120 mg / dl HBO Diabetic Blood Glucose Intervention Range: <131 mg/dl or >086 mg/dl Time Vitals Blood Respiratory Capillary Blood Glucose Pulse Action Type: Pulse: Temperature: Taken: Pressure: Rate: Glucose (mg/dl): Meter #: Oximetry (%) Taken: Pre 10:31 117/71 91 20 98.5 Post 12:24 113/62 72 18 98.2 Treatment Response Treatment Toleration: Well Treatment Completion Status: Treatment Completed without Adverse Event Electronic Signature(s) Signed: 12/25/2021 2:50:28 PM By: Haywood Pao CHT EMT BS , , Signed: 12/25/2021 4:51:59 PM By: Lenda Kelp PA-C Entered By: Haywood Pao on  12/25/2021 14:50:28 -------------------------------------------------------------------------------- HBO Safety Checklist Details Patient Name: Date of Service: [place], [name] 12/25/2021 10:00 A M Medical Record Number: [id number] Patient Account Number: [id number] Date of Birth/Sex: Treating RN: [birth year] (71 y.o. [name] Primary Care Arran Fessel: [name] Other Clinician: Haywood Pao Referring Isac Lincks: Treating Bona Hubbard/Extender: Roberts Gaudy, Tiffany Weeks in Treatment: 10 HBO Safety Checklist Items Safety Checklist Consent Form Signed Patient voided / foley secured and emptied When did you last eato 0730 Last dose of injectable or oral agent n/a Ostomy pouch emptied and vented if applicable NA All implantable devices assessed, documented and approved Gastric tube Intravenous access site secured and place NA Valuables secured Linens and cotton and cotton/polyester blend (less than 51% polyester) Personal oil-based products / skin lotions / body lotions removed Wigs or hairpieces removed NA Smoking or tobacco materials removed NA Books / newspapers / magazines / loose paper removed Cologne, aftershave, perfume and deodorant removed Jewelry removed (may wrap wedding band) Make-up removed NA Hair care products removed Battery operated devices (external) removed Heating patches and chemical warmers removed Titanium eyewear removed NA Patient's glasses are not worn in chamber Nail polish cured greater than 10 hours NA Casting material cured greater than 10 hours NA Hearing aids removed NA Loose dentures or partials removed NA Prosthetics have been removed NA Patient demonstrates correct use of air break device (if applicable) Patient concerns have been addressed Patient grounding bracelet on and cord attached to chamber Specifics for Inpatients (complete in addition to above) Medication sheet sent with patient NA Intravenous  medications needed or due during therapy sent with patient NA Drainage tubes (e.g. nasogastric tube or chest tube secured and vented) NA Endotracheal or Tracheotomy tube secured NA Cuff deflated of air and inflated with saline NA Airway suctioned NA Notes Paper version used prior to treatment.  Electronic Signature(s) Signed: 12/25/2021 2:49:44 PM By: Haywood Pao CHT EMT BS , , Entered By: Haywood Pao on 12/25/2021 14:49:44

## 2021-12-26 ENCOUNTER — Encounter (HOSPITAL_BASED_OUTPATIENT_CLINIC_OR_DEPARTMENT_OTHER): Payer: Medicare Other | Admitting: Internal Medicine

## 2021-12-26 ENCOUNTER — Other Ambulatory Visit: Payer: Self-pay

## 2021-12-26 DIAGNOSIS — R1319 Other dysphagia: Secondary | ICD-10-CM | POA: Diagnosis not present

## 2021-12-26 DIAGNOSIS — L598 Other specified disorders of the skin and subcutaneous tissue related to radiation: Secondary | ICD-10-CM | POA: Diagnosis not present

## 2021-12-26 DIAGNOSIS — Z85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, oral cavity, and pharynx: Secondary | ICD-10-CM | POA: Diagnosis not present

## 2021-12-26 DIAGNOSIS — Z510 Encounter for antineoplastic radiation therapy: Secondary | ICD-10-CM | POA: Diagnosis not present

## 2021-12-26 NOTE — Progress Notes (Addendum)
AADVIK, ROKER ([id number]) Visit Report for 12/26/2021 Arrival Information Details Patient Name: Date of Service: [place], [place]. 12/26/2021 10:00 A M Medical Record Number: [id number] Patient Account Number: [id number] Date of Birth/Sex: Treating RN: Jan 21, 1951 (71 y.o. [name] Primary Care Trea Carnegie: [name] Other Clinician: Referring Rilyn Scroggs: Treating Abi Shoults/Extender: Tacey Ruiz in Treatment: 10 Visit Information History Since Last Visit All ordered tests and consults were completed: Yes Patient Arrived: Ambulatory Added or deleted any medications: No Arrival Time: 10:16 Any new allergies or adverse reactions: No Accompanied By: None Had a fall or experienced change in No Transfer Assistance: None activities of daily living that may affect Patient Identification Verified: Yes risk of falls: Secondary Verification Process Completed: Yes Signs or symptoms of abuse/neglect since last visito No Patient Requires Transmission-Based Precautions: No Hospitalized since last visit: No Patient Has Alerts: No Implantable device outside of the clinic excluding No cellular tissue based products placed in the center since last visit: Pain Present Now: No Electronic Signature(s) Signed: 12/26/2021 1:02:52 PM By: [name] Batman EMT Entered By: [name] Batman on 12/26/2021 13:02:52 -------------------------------------------------------------------------------- Encounter Discharge Information Details Patient Name: Date of Service: [name], [name] 12/26/2021 10:00 A M Medical Record Number: [id number] Patient Account Number: [id number] Date of Birth/Sex: Treating RN: 1951/01/01 (71 y.o. [name] Primary Care Elsworth Ledin: [name] Other Clinician: [name] Batman Referring Cecille Mcclusky: Treating Zacharius Funari/Extender: Tacey Ruiz in Treatment: 10 Encounter Discharge Information Items Discharge Condition:  Stable Ambulatory Status: Ambulatory Discharge Destination: Home Transportation: Private Auto Accompanied By: None Schedule Follow-up Appointment: Yes Clinical Summary of Care: Electronic Signature(s) Signed: 12/26/2021 1:09:57 PM By: [name] Batman EMT Entered By: [name] Batman on 12/26/2021 13:09:57 -------------------------------------------------------------------------------- Patient/Caregiver Education Details Patient Name: Date of Service: [name], [name] 1/5/2023andnbsp10:00 A M Medical Record Number: [id number] Patient Account Number: [id number] Date of Birth/Gender: Treating RN: August 21, 1951 (71 y.o. [name] Primary Care Physician: [name] Other Clinician: [name] Batman Referring Physician: Treating Physician/Extender: Tacey Ruiz in Treatment: 10 Education Assessment Education Provided To: Patient Education Topics Provided Electronic Signature(s) Signed: 12/26/2021 1:09:36 PM By: [name] Batman EMT Entered By: [name] Batman on 12/26/2021 13:09:36 -------------------------------------------------------------------------------- Vitals Details Patient Name: Date of Service: [name], [name] 12/26/2021 10:00 A M Medical Record Number: [id number] Patient Account Number: [id number] Date of Birth/Sex: Treating RN: 07/16/1951 (71 y.o. [name] Primary Care Adilenne Ashworth: [name] Other Clinician: [name] Batman Referring Lilja Soland: Treating Gerrard Crystal/Extender: Tacey Ruiz in Treatment: 10 Vital Signs Time Taken: 10:43 Temperature (F): 98.3 Height (in): 68 Pulse (bpm): 78 Weight (lbs): 127 Respiratory Rate (breaths/min): 16 Body Mass Index (BMI): 19.3 Blood Pressure (mmHg): 103/54 Reference Range: 80 - 120 mg / dl Electronic Signature(s) Signed: 12/26/2021 1:03:45 PM By: [name] Batman EMT Entered By: [name] Batman on 12/26/2021 13:03:44

## 2021-12-26 NOTE — Progress Notes (Addendum)
DYRON, DOTZLER ([id number]) Visit Report for 12/26/2021 HBO Details Patient Name: Date of Service: [place], [name]. 12/26/2021 10:00 A M Medical Record Number: [id number] Patient Account Number: [id number] Date of Birth/Sex: Treating RN: 02-Feb-1951 (71 y.o. 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 HBO Treatment Details Treatment Number: 23 Patient Type: Outpatient Chamber Type: Monoplace Chamber Serial #: [id number] Treatment Protocol: 2.0 ATA with 90 minutes oxygen, and no air breaks Treatment Details Compression Rate Down: 2.0 psi / minute De-Compression Rate Up: 2.0 psi / minute Air breaks and breathing Decompress Decompress Compress Tx Pressure Begins Reached periods Begins Ends (leave unused spaces blank) Chamber Pressure (ATA 1 2 ------2 1 ) Clock Time (24 hr) 10:49 10:57 - - - - - - 12:28 12:35 Treatment Length: 106 (minutes) Treatment Segments: 4 Vital Signs Capillary Blood Glucose Reference Range: 80 - 120 mg / dl HBO Diabetic Blood Glucose Intervention Range: <131 mg/dl or >366 mg/dl Time Vitals Blood Respiratory Capillary Blood Glucose Pulse Action Type: Pulse: Temperature: Taken: Pressure: Rate: Glucose (mg/dl): Meter #: Oximetry (%) Taken: Pre 10:43 103/54 78 16 98.3 Post 12:39 103/65 69 16 98.5 Treatment Response Treatment Toleration: Well Treatment Completion Status: Treatment Completed without Adverse Event Margarine Grosshans Notes I certify that I directed and performed operation of said chamber during this treatment. MScammell I discussed things with the patient today. He says he is feeling  somewhat better. His voice certainly sounds clearer and I have noticed this over the last several days. Yet there is no improvement in his swallowing he still cannot clear his saliva. He is only had dive 22 however Physician HBO Attestation: I certify that I supervised this HBO treatment in accordance with Medicare guidelines. A trained emergency response team is readily available per Yes hospital policies and procedures. Continue HBOT as ordered. Yes Electronic Signature(s) Signed: 12/26/2021 4:38:12 PM By: [name] MD Previous Signature: 12/26/2021 2:10:12 PM Version By: Haywood Pao CHT EMT BS , , Previous Signature: 12/26/2021 1:07:21 PM Version By: [name] EMT Entered By: [name] on 12/26/2021 16:36:03 -------------------------------------------------------------------------------- HBO Safety Checklist Details Patient Name: Date of Service: [place], [name] 12/26/2021 10:00 A M Medical Record Number: [id number] Patient Account Number: [id number] Date of Birth/Sex: Treating RN: 06/21/1951 (71 y.o. [name], [name] Primary Care Aubrianna Orchard: [name] Other Clinician: Haywood Pao Referring Sopheap Boehle: Treating Eloise Mula/Extender: Standley Brooking, [place] Planas in Treatment: 10 HBO Safety Checklist Items Safety Checklist Consent Form Signed Patient voided / foley secured and emptied When did you last eato 0730 Peg tube Last dose of injectable or oral agent Ostomy pouch emptied and vented if applicable NA All implantable devices assessed, documented and approved NA Intravenous access site secured and place NA Valuables secured Linens and cotton and cotton/polyester blend (less than 51% polyester) Personal oil-based products / skin lotions / body lotions removed Wigs or hairpieces removed NA Smoking or tobacco materials removed Books / newspapers / magazines / loose paper removed Cologne, aftershave, perfume and deodorant removed Jewelry  removed (may wrap wedding band) Make-up removed NA Hair care products removed Battery operated devices (external) removed Heating patches and chemical warmers removed Titanium eyewear removed NA Nail polish cured greater than 10 hours NA Casting material cured greater than 10  hours NA Hearing aids removed NA Loose dentures or partials removed NA Prosthetics have been removed NA Patient demonstrates correct use of air break device (if applicable) Patient concerns have been addressed Patient grounding bracelet on and cord attached to chamber Specifics for Inpatients (complete in addition to above) Medication sheet sent with patient NA Intravenous medications needed or due during therapy sent with patient NA Drainage tubes (e.g. nasogastric tube or chest tube secured and vented) NA Endotracheal or Tracheotomy tube secured NA Cuff deflated of air and inflated with saline NA Airway suctioned NA Notes Paper version used prior to treatment. I certify I directed and performed the safety check for this treatment. MScammell Electronic Signature(s) Signed: 12/26/2021 2:07:32 PM By: Haywood Pao CHT EMT BS , , Previous Signature: 12/26/2021 1:05:37 PM Version By: [name] EMT Entered By: Haywood Pao on 12/26/2021 14:07:32

## 2021-12-26 NOTE — Progress Notes (Signed)
[name], [name] ([id number]) Visit Report for 12/26/2021 Physician Orders Details Patient Name: Date of Service: [place], [name] 12/26/2021 10:00 A M Medical Record Number: [id number] Patient Account Number: [id number] Date of Birth/Sex: Treating RN: 02-06-1951 (71 y.o. [name] Primary Care Provider: [name] Other Clinician: Referring Provider: Treating Provider/Extender: [name], [name] in Treatment: 10 Verbal / Phone Orders: No Diagnosis Coding Electronic Signature(s) Signed: 12/26/2021 1:08:54 PM By: [name] Batman EMT Signed: 12/26/2021 4:38:12 PM By: [name] MD Entered By: [name] Batman on 12/26/2021 13:08:54 -------------------------------------------------------------------------------- SuperBill Details Patient Name: Date of Service: [name], [name] 12/26/2021 Medical Record Number: [id number] Patient Account Number: [id number] Date of Birth/Sex: Treating RN: 1951-02-08 (71 y.o. 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, oral cavity, and pharynx Facility Procedures The patient participates with Medicare or their insurance follows the Medicare Facility Guidelines: CPT4 Code Description Modifier Quantity [id number] G0277-(Facility Use Only) HBOT full body chamber, 23min , 4 ICD-10 Diagnosis Description L59.8 Other  specified disorders of the skin and subcutaneous tissue related to radiation Z51.0 Encounter for antineoplastic radiation therapy R13.19 Other dysphagia Z85.818 Personal history of malignant neoplasm of  other sites of lip, oral cavity, and pharynx Physician Procedures : CPT4 Code Description Modifier [id number] - WC PHYS HYPERBARIC OXYGEN THERAPY ICD-10 Diagnosis Description L59.8 Other specified disorders of the skin and subcutaneous tissue related to radiation Z51.0 Encounter for antineoplastic radiation therapy  R13.19 Other dysphagia 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

## 2021-12-27 ENCOUNTER — Encounter (HOSPITAL_BASED_OUTPATIENT_CLINIC_OR_DEPARTMENT_OTHER): Payer: Medicare Other | Admitting: Internal Medicine

## 2021-12-27 DIAGNOSIS — Z85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, oral cavity, and pharynx: Secondary | ICD-10-CM | POA: Diagnosis not present

## 2021-12-27 DIAGNOSIS — R1319 Other dysphagia: Secondary | ICD-10-CM

## 2021-12-27 DIAGNOSIS — L598 Other specified disorders of the skin and subcutaneous tissue related to radiation: Secondary | ICD-10-CM

## 2021-12-27 DIAGNOSIS — Z510 Encounter for antineoplastic radiation therapy: Secondary | ICD-10-CM | POA: Diagnosis not present

## 2021-12-27 NOTE — Progress Notes (Addendum)
[name], Juarez ([id number]) Visit Report for 12/27/2021 HBO Details Patient Name: Date of Service: [place], [name]. 12/27/2021 10:00 A M Medical Record Number: [id number] Patient Account Number: [id number] Date of Birth/Sex: Treating RN: 10/15/1951 (71 y.o. 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 HBO Treatment Details Treatment Number: 24 Patient Type: Outpatient Chamber Type: Monoplace Chamber Serial #: [id number] Treatment Protocol: 2.0 ATA with 90 minutes oxygen, and no air breaks Treatment Details Compression Rate Down: 2.0 psi / minute De-Compression Rate Up: 2.0 psi / minute Air breaks and breathing Decompress Decompress Compress Tx Pressure Begins Reached periods Begins Ends (leave unused spaces blank) Chamber Pressure (ATA 1 2 ------2 1 ) Clock Time (24 hr) 10:59 11:07 - - - - - - 12:37 12:45 Treatment Length: 106 (minutes) Treatment Segments: 4 Vital Signs Capillary Blood Glucose Reference Range: 80 - 120 mg / dl HBO Diabetic Blood Glucose Intervention Range: <131 mg/dl or >841 mg/dl Time Vitals Blood Respiratory Capillary Blood Glucose Pulse Action Type: Pulse: Temperature: Taken: Pressure: Rate: Glucose (mg/dl): Meter #: Oximetry (%) Taken: Pre 10:35 104/50 73 16 98.2 Post 12:50 110/58 58 16 98.3 100 Treatment Response Treatment Toleration: Well Treatment Completion Status: Treatment Completed without Adverse Event Physician HBO Attestation: I certify that I supervised this HBO treatment in accordance with Medicare guidelines. A trained emergency response team is readily available per  Yes hospital policies and procedures. Continue HBOT as ordered. Yes Electronic Signature(s) Signed: 12/30/2021 10:06:40 AM By: Geralyn [name] DO Previous Signature: 12/27/2021 1:34:13 PM Version By: Haywood Pao CHT EMT BS , , Entered By: Geralyn [name] on 12/30/2021 10:04:50 -------------------------------------------------------------------------------- HBO Safety Checklist Details Patient Name: Date of Service: [name], [name] 12/27/2021 10:00 A M Medical Record Number: [id number] Patient Account Number: [id number] Date of Birth/Sex: Treating RN: 09/29/1951 (71 y.o. [name] Primary Care [name] Krisko: [name] Other Clinician: Haywood Pao Referring Airyana Sprunger: Treating Digby Groeneveld/Extender: Cristopher Peru Weeks in Treatment: 10 HBO Safety Checklist Items Safety Checklist Consent Form Signed Patient voided / foley secured and emptied When did you last eato 0730 Last dose of injectable or oral agent n/a Ostomy pouch emptied and vented if applicable NA All implantable devices assessed, documented and approved NA Intravenous access site secured and place NA Valuables secured Linens and cotton and cotton/polyester blend (less than 51% polyester) Personal oil-based products / skin lotions / body lotions removed Wigs or hairpieces removed NA Smoking or tobacco materials removed NA Books / newspapers / magazines / loose paper removed Cologne, aftershave, perfume and deodorant removed Jewelry removed (may wrap wedding band) Make-up removed NA Hair care products removed Battery operated devices (external) removed Heating patches and chemical warmers removed Titanium eyewear removed NA Nail polish cured greater than 10 hours NA Casting material cured greater than 10 hours NA Hearing aids removed NA Loose dentures or partials removed NA Prosthetics have been removed NA Patient demonstrates correct use of air break device (if  applicable) Patient concerns have been addressed Patient grounding bracelet on and cord attached to chamber Specifics for Inpatients (complete in addition to above) Medication sheet sent with patient NA Intravenous medications needed or due during therapy sent with patient NA Drainage tubes (e.g. nasogastric tube or chest tube secured and  vented) NA Endotracheal or Tracheotomy tube secured NA Cuff deflated of air and inflated with saline NA Airway suctioned NA Notes Paper version used prior to treatment. Electronic Signature(s) Signed: 12/27/2021 1:33:31 PM By: Haywood Pao CHT EMT BS , , Previous Signature: 12/27/2021 11:49:34 AM Version By: Haywood Pao CHT EMT BS , , Entered By: Haywood Pao on 12/27/2021 13:33:30

## 2021-12-27 NOTE — Progress Notes (Addendum)
CLARNCE, HOMAN ([id number]) Visit Report for 12/27/2021 Arrival Information Details Patient Name: Date of Service: [place], [name] 12/27/2021 10:00 A M Medical Record Number: [id number] Patient Account Number: [id number] Date of Birth/Sex: Treating RN: 1951-04-23 (71 y.o. [name] Primary Care Dalisha Shively: [name] Other Clinician: [name] Batman Referring Shawntrice Salle: Treating Burnis Kaser/Extender: [name] in Treatment: 10 Visit Information History Since Last Visit All ordered tests and consults were completed: Yes Patient Arrived: Ambulatory Added or deleted any medications: No Arrival Time: 10:14 Any new allergies or adverse reactions: No Accompanied By: self Had a fall or experienced change in No Transfer Assistance: None activities of daily living that may affect Patient Identification Verified: Yes risk of falls: Secondary Verification Process Completed: Yes Signs or symptoms of abuse/neglect since last visito No Patient Requires Transmission-Based Precautions: No Hospitalized since last visit: No Patient Has Alerts: No Implantable device outside of the clinic excluding No cellular tissue based products placed in the center since last visit: Pain Present Now: No Electronic Signature(s) Signed: 12/27/2021 11:47:27 AM By: [name] CHT EMT BS , , Entered By: [name] on 12/27/2021 11:47:26 -------------------------------------------------------------------------------- Encounter Discharge Information Details Patient Name: Date of Service: [name], [name] 12/27/2021 10:00 A M Medical Record Number: [id number] Patient Account Number: [id number] Date of Birth/Sex: Treating RN: Aug 22, 1951 (71 y.o. [name] Primary Care Nellie Pester: [name] Other Clinician: [name] Referring Aroura Vasudevan: Treating Hollace Michelli/Extender: [name] in Treatment: 10 Encounter Discharge  Information Items Discharge Condition: Stable Ambulatory Status: Ambulatory Discharge Destination: Home Transportation: Private Auto Accompanied By: self Schedule Follow-up Appointment: No Clinical Summary of Care: Electronic Signature(s) Signed: 12/27/2021 1:37:47 PM By: [name] CHT EMT BS , , Previous Signature: 12/27/2021 1:37:11 PM Version By: [name] CHT EMT BS , , Entered By: [name] on 12/27/2021 13:37:47 -------------------------------------------------------------------------------- [place] Details Patient Name: Date of Service: [name], [name] 12/27/2021 10:00 A M Medical Record Number: [id number] Patient Account Number: [id number] Date of Birth/Sex: Treating RN: 1951/05/08 (71 y.o. [name] Primary Care Amando Ishikawa: [name] Other Clinician: [name] Batman Referring Lakesia Dahle: Treating Hadley Detloff/Extender: [name] Weeks in Treatment: 10 Vital Signs Time Taken: 10:35 Temperature (F): 98.2 Height (in): 68 Pulse (bpm): 73 Weight (lbs): 127 Respiratory Rate (breaths/min): 16 Body Mass Index (BMI): 19.3 Blood Pressure (mmHg): 104/50 Reference Range: 80 - 120 mg / dl Electronic Signature(s) Signed: 12/27/2021 11:48:02 AM By: [name] CHT EMT BS , , Entered By: [name] on 12/27/2021 11:48:02

## 2021-12-30 ENCOUNTER — Encounter (HOSPITAL_BASED_OUTPATIENT_CLINIC_OR_DEPARTMENT_OTHER): Payer: Medicare Other | Admitting: Internal Medicine

## 2021-12-30 ENCOUNTER — Other Ambulatory Visit: Payer: Self-pay

## 2021-12-30 DIAGNOSIS — L598 Other specified disorders of the skin and subcutaneous tissue related to radiation: Secondary | ICD-10-CM

## 2021-12-30 DIAGNOSIS — R1319 Other dysphagia: Secondary | ICD-10-CM | POA: Diagnosis not present

## 2021-12-30 DIAGNOSIS — Z85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, oral cavity, and pharynx: Secondary | ICD-10-CM

## 2021-12-30 DIAGNOSIS — Z510 Encounter for antineoplastic radiation therapy: Secondary | ICD-10-CM | POA: Diagnosis not present

## 2021-12-30 NOTE — Progress Notes (Signed)
[name], [name] ([id number]) Visit Report for 12/27/2021 [place] Details Patient Name: Date of Service: [place], [name] 12/27/2021 Medical Record Number: [id number] Patient Account Number: [id number] Date of Birth/Sex: Treating RN: 04-11-1951 (71 y.o. 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, oral cavity, and pharynx Facility Procedures The patient participates with Medicare or their insurance follows the Medicare Facility Guidelines CPT4 Code Description Modifier Quantity [id number] G0277-(Facility Use Only) HBOT full body chamber, 64min , 4 ICD-10 Diagnosis Description L59.8 Other specified disorders of the skin and subcutaneous tissue related to radiation Z51.0 Encounter for antineoplastic radiation therapy R13.19 Other dysphagia 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, oral cavity, and pharynx Physician Procedures Quantity CPT4 Code Description Modifier [id number] - WC PHYS HYPERBARIC OXYGEN THERAPY 1 ICD-10 Diagnosis Description L59.8 Other specified disorders of the skin and subcutaneous tissue related to radiation Z51.0 Encounter for antineoplastic radiation therapy R13.19 Other dysphagia 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

## 2021-12-30 NOTE — Progress Notes (Addendum)
ZACHAREE, GADDIE ([id number]) Visit Report for 12/30/2021 Arrival Information Details Patient Name: Date of Service: [place], [name] 12/30/2021 10:00 A M Medical Record Number: [id number] Patient Account Number: [id number] Date of Birth/Sex: Treating RN: 1951/03/06 (71 y.o. [name] Primary Care Layton Naves: [name] Other Clinician: [name] Batman Referring Jomarie Gellis: Treating Cashlyn Huguley/Extender: [name] in Treatment: 10 Visit Information History Since Last Visit All ordered tests and consults were completed: Yes Patient Arrived: Ambulatory Added or deleted any medications: No Arrival Time: 09:48 Any new allergies or adverse reactions: No Accompanied By: None Had a fall or experienced change in No Transfer Assistance: None activities of daily living that may affect Patient Identification Verified: Yes risk of falls: Secondary Verification Process Completed: Yes Signs or symptoms of abuse/neglect since last visito No Patient Requires Transmission-Based Precautions: No Hospitalized since last visit: No Patient Has Alerts: No Implantable device outside of the clinic excluding No cellular tissue based products placed in the center since last visit: Pain Present Now: No Electronic Signature(s) Signed: 12/30/2021 12:16:33 PM By: [name] Batman EMT Entered By: [name] Batman on 12/30/2021 12:16:33 -------------------------------------------------------------------------------- [place] Details Patient Name: Date of Service: [name], [name] 12/30/2021 10:00 A M Medical Record Number: [id number] Patient Account Number: [id number] Date of Birth/Sex: Treating RN: [birth year] (71 y.o. [name] Primary Care Dashayla Theissen: [name] Other Clinician: [name] Batman Referring Sebasthian Stailey: Treating Serinity Ware/Extender: [name] Weeks in Treatment: 10 Vital Signs Time Taken: 10:01 Temperature (F): 98.6 Height (in):  68 Pulse (bpm): 87 Weight (lbs): 127 Respiratory Rate (breaths/min): 18 Body Mass Index (BMI): 19.3 Blood Pressure (mmHg): 118/56 Reference Range: 80 - 120 mg / dl Electronic Signature(s) Signed: 12/30/2021 12:17:11 PM By: [name] Batman EMT Entered By: [name] Batman on 12/30/2021 12:17:11

## 2021-12-30 NOTE — Progress Notes (Signed)
CELVIN, TANEY ([id number]) Visit Report for 12/30/2021 Physician Orders Details Patient Name: Date of Service: [place], [place]. 12/30/2021 10:00 A M Medical Record Number: [id number] Patient Account Number: [id number] Date of Birth/Sex: Treating RN: 07/06/1951 (71 y.o. 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 12/30/2021 10:00 A M Medical Record Number: [id number] Patient Account Number: [id number] Date of Birth/Sex: Treating RN: [birth year] (71 y.o. 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 12/30/2021 Medical Record Number: [id number] Patient Account Number: [id number] Date of Birth/Sex: Treating RN: 1951-03-18 (71 y.o. 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, oral cavity, and pharynx Facility Procedures The patient participates with Medicare or their insurance follows the Medicare Facility Guidelines: CPT4 Code Description Modifier Quantity [id number] G0277-(Facility Use Only) HBOT full body chamber, 52min , 4 ICD-10 Diagnosis Description L59.8 Other  specified disorders of the skin and subcutaneous tissue related to radiation Z51.0 Encounter for antineoplastic radiation therapy R13.19 Other dysphagia 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, oral cavity, and pharynx Physician Procedures : CPT4 Code Description Modifier [id number] - WC PHYS HYPERBARIC OXYGEN THERAPY ICD-10 Diagnosis Description L59.8 Other specified disorders of the skin and subcutaneous tissue related to radiation Z51.0 Encounter for antineoplastic radiation therapy  R13.19 Other dysphagia 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

## 2021-12-30 NOTE — Progress Notes (Addendum)
[name] ([id number]) Visit Report for 12/30/2021 HBO Details Patient Name: Date of Service: [place], [name]. 12/30/2021 10:00 A M Medical Record Number: [id number] Patient Account Number: [id number] Date of Birth/Sex: Treating RN: July 30, 1951 (71 y.o. 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 HBO Treatment Details Treatment Number: 25 Patient Type: Outpatient Chamber Type: Monoplace Chamber Serial #: [id number] Treatment Protocol: 2.0 ATA with 90 minutes oxygen, and no air breaks Treatment Details Compression Rate Down: 2.0 psi / minute De-Compression Rate Up: 2.0 psi / minute Air breaks and breathing Decompress Decompress Compress Tx Pressure Begins Reached periods Begins Ends (leave unused spaces blank) Chamber Pressure (ATA 1 2 ------2 1 ) Clock Time (24 hr) 10:08 10:16 - - - - - - 11:46 11:54 Treatment Length: 106 (minutes) Treatment Segments: 4 Vital Signs Capillary Blood Glucose Reference Range: 80 - 120 mg / dl HBO Diabetic Blood Glucose Intervention Range: <131 mg/dl or >132 mg/dl Time Vitals Blood Respiratory Capillary Blood Glucose Pulse Action Type: Pulse: Temperature: Taken: Pressure: Rate: Glucose (mg/dl): Meter #: Oximetry (%) Taken: Pre 10:01 118/56 87 18 98.6 Post 11:56 112/52 66 18 98.2 Treatment Response Treatment Toleration: Well Treatment Completion Status: Treatment Completed without Adverse Event Treatment Notes I certify that I directed the operation of said chamber for this treatment. [place] Physician HBO Attestation: I certify that I supervised this HBO treatment in  accordance with Medicare guidelines. A trained emergency response team is readily available per Yes hospital policies and procedures. Continue HBOT as ordered. Yes Electronic Signature(s) Signed: 12/30/2021 2:01:33 PM By: Haywood Pao CHT EMT BS , , Signed: 12/30/2021 2:48:07 PM By: Geralyn [name] DO Previous Signature: 12/30/2021 1:27:31 PM Version By: Geralyn [name] DO Previous Signature: 12/30/2021 12:21:26 PM Version By: [name] EMT Entered By: Haywood Pao on 12/30/2021 14:01:33 -------------------------------------------------------------------------------- HBO Safety Checklist Details Patient Name: Date of Service: [place], [name] 12/30/2021 10:00 A M Medical Record Number: [id number] Patient Account Number: [id number] Date of Birth/Sex: Treating RN: May 27, 1951 (71 y.o. [name] Primary Care [name]: [name] Other Clinician: [name] Referring [name]: Treating Amarissa Koerner/Extender: Cristopher Peru Weeks in Treatment: 10 HBO Safety Checklist Items Safety Checklist Consent Form Signed Patient voided / foley secured and emptied When did you last eato 0730 Peg tube Last dose of injectable or oral agent NA Ostomy pouch emptied and vented if applicable NA All implantable devices assessed, documented and approved NA Intravenous access site secured and place NA Valuables secured Linens and cotton and cotton/polyester blend (less than 51% polyester) Personal oil-based products / skin lotions / body lotions removed Wigs or hairpieces removed NA Smoking or tobacco materials removed Books / newspapers / magazines / loose paper removed Cologne, aftershave, perfume and deodorant removed Jewelry removed (may wrap wedding band) NA Make-up removed NA Hair care products removed NA Battery operated devices (external) removed Heating patches and chemical warmers removed Titanium eyewear removed NA Nail polish cured greater  than 10 hours NA Casting material cured greater than 10 hours NA Hearing aids removed NA Loose dentures or partials removed NA Prosthetics have been removed NA Patient demonstrates correct use of air break device (if applicable) Patient concerns have been addressed Patient grounding bracelet on and cord attached to  chamber Specifics for Inpatients (complete in addition to above) Medication sheet sent with patient NA Intravenous medications needed or due during therapy sent with patient NA Drainage tubes (e.g. nasogastric tube or chest tube secured and vented) NA Endotracheal or Tracheotomy tube secured NA Cuff deflated of air and inflated with saline NA Airway suctioned NA Notes Paper version used prior to treatment. I certify that I directed performance of the safety check for this treatment. MScammell Electronic Signature(s) Signed: 12/30/2021 2:02:07 PM By: Haywood Pao CHT EMT BS , , Previous Signature: 12/30/2021 2:00:54 PM Version By: Haywood Pao CHT EMT BS , , Previous Signature: 12/30/2021 12:19:00 PM Version By: [name] EMT Entered By: Haywood Pao on 12/30/2021 14:02:07

## 2021-12-31 ENCOUNTER — Encounter (HOSPITAL_BASED_OUTPATIENT_CLINIC_OR_DEPARTMENT_OTHER): Payer: Medicare Other | Admitting: Internal Medicine

## 2021-12-31 DIAGNOSIS — L598 Other specified disorders of the skin and subcutaneous tissue related to radiation: Secondary | ICD-10-CM | POA: Diagnosis not present

## 2021-12-31 DIAGNOSIS — Z85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, oral cavity, and pharynx: Secondary | ICD-10-CM | POA: Diagnosis not present

## 2021-12-31 DIAGNOSIS — H905 Unspecified sensorineural hearing loss: Secondary | ICD-10-CM | POA: Diagnosis not present

## 2021-12-31 DIAGNOSIS — R1319 Other dysphagia: Secondary | ICD-10-CM | POA: Diagnosis not present

## 2021-12-31 DIAGNOSIS — Z510 Encounter for antineoplastic radiation therapy: Secondary | ICD-10-CM | POA: Diagnosis not present

## 2021-12-31 NOTE — Progress Notes (Addendum)
KOFI, PRINKEY ([id number]) Visit Report for 12/31/2021 HBO Details Patient Name: Date of Service: [place], [name]. 12/31/2021 10:00 A M Medical Record Number: [id number] Patient Account Number: [id number] Date of Birth/Sex: Treating RN: 11/24/1951 (71 y.o. 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 HBO Treatment Details Treatment Number: 26 Patient Type: Outpatient Chamber Type: Monoplace Chamber Serial #: [id number] Treatment Protocol: 2.0 ATA with 90 minutes oxygen, and no air breaks Treatment Details Compression Rate Down: 2.0 psi / minute De-Compression Rate Up: 2.0 psi / minute Air breaks and breathing Decompress Decompress Compress Tx Pressure Begins Reached periods Begins Ends (leave unused spaces blank) Chamber Pressure (ATA 1 2 ------2 1 ) Clock Time (24 hr) 10:10 10:18 - - - - - - 11:48 11:56 Treatment Length: 106 (minutes) Treatment Segments: 4 Vital Signs Capillary Blood Glucose Reference Range: 80 - 120 mg / dl HBO Diabetic Blood Glucose Intervention Range: <131 mg/dl or >440 mg/dl Time Vitals Blood Respiratory Capillary Blood Glucose Pulse Action Type: Pulse: Temperature: Taken: Pressure: Rate: Glucose (mg/dl): Meter #: Oximetry (%) Taken: Pre 10:03 108/54 72 16 98.2 98 Post 12:00 102/48 66 16 98.2 100 Treatment Response Treatment Toleration: Well Treatment Completion Status: Treatment Completed without Adverse Event Treatment Notes Paper version used prior to treatment. I certify that I directed operation of said chamber for today's treatment. MScammell Avaneesh Pepitone Notes No concerns with  treatment given Physician HBO Attestation: I certify that I supervised this HBO treatment in accordance with Medicare guidelines. A trained emergency response team is readily available per Yes hospital policies and procedures. Continue HBOT as ordered. Yes Electronic Signature(s) Signed: 12/31/2021 4:28:08 PM By: [name] MD Previous Signature: 12/31/2021 3:34:32 PM Version By: Haywood Pao CHT EMT BS , , Previous Signature: 12/31/2021 3:31:42 PM Version By: Haywood Pao CHT EMT BS , , Previous Signature: 12/31/2021 1:48:34 PM Version By: [name] EMT Entered By: [name] on 12/31/2021 16:25:39 -------------------------------------------------------------------------------- HBO Safety Checklist Details Patient Name: Date of Service: [place], [name] 12/31/2021 10:00 A M Medical Record Number: [id number] Patient Account Number: [id number] Date of Birth/Sex: Treating RN: Jul 02, 1951 (71 y.o. [name] Primary Care Charnetta Wulff: [name] Other Clinician: [name] Referring Olajuwon Fosdick: Treating Rickey Farrier/Extender: [name] in Treatment: 11 HBO Safety Checklist Items Safety Checklist Consent Form Signed Patient voided / foley secured and emptied When did you last eato 0730 Peg tube Last dose of injectable or oral agent NA Ostomy pouch emptied and vented if applicable NA All implantable devices assessed, documented and approved NA Intravenous access site secured and place NA Valuables secured Linens and cotton and cotton/polyester blend (less than 51% polyester) Personal oil-based products / skin lotions / body lotions removed Wigs or hairpieces removed NA Smoking or tobacco materials removed Books / newspapers / magazines / loose paper removed Cologne, aftershave, perfume and deodorant removed Jewelry removed (may wrap wedding band) NA Make-up removed NA Hair care products removed NA Battery operated  devices (external) removed Heating patches and chemical warmers removed Titanium eyewear removed NA Nail polish cured greater than 10 hours NA Casting material cured greater than 10 hours NA Hearing aids removed NA Loose dentures or partials removed NA Prosthetics have been removed NA Patient demonstrates correct  use of air break device (if applicable) Patient concerns have been addressed Patient grounding bracelet on and cord attached to chamber Specifics for Inpatients (complete in addition to above) Medication sheet sent with patient NA Intravenous medications needed or due during therapy sent with patient NA Drainage tubes (e.g. nasogastric tube or chest tube secured and vented) NA Endotracheal or Tracheotomy tube secured NA Cuff deflated of air and inflated with saline NA Airway suctioned NA Notes Paper version used prior to treatment. I certify that I directed the safety check for this treatment. M[place]ell Electronic Signature(s) Signed: 12/31/2021 3:34:01 PM By: Haywood Pao CHT EMT BS , , Previous Signature: 12/31/2021 3:31:28 PM Version By: Haywood Pao CHT EMT BS , , Previous Signature: 12/31/2021 1:46:06 PM Version By: [name] EMT Entered By: Haywood Pao on 12/31/2021 15:34:01

## 2021-12-31 NOTE — Progress Notes (Addendum)
MURAD, STAPLES ([id number]) Visit Report for 12/31/2021 Arrival Information Details Patient Name: Date of Service: [place], [name] 12/31/2021 10:00 A M Medical Record Number: [id number] Patient Account Number: [id number] Date of Birth/Sex: Treating RN: July 15, 1951 (71 y.o. [name] Primary Care Duanna Runk: [name] Other Clinician: [name] Batman Referring Kolbi Tofte: Treating Xion Debruyne/Extender: Tacey Ruiz in Treatment: 11 Visit Information History Since Last Visit All ordered tests and consults were completed: Yes Patient Arrived: Ambulatory Added or deleted any medications: No Arrival Time: 09:39 Any new allergies or adverse reactions: No Accompanied By: None Had a fall or experienced change in No Transfer Assistance: None activities of daily living that may affect Patient Identification Verified: Yes risk of falls: Secondary Verification Process Completed: Yes Signs or symptoms of abuse/neglect since last visito No Patient Requires Transmission-Based Precautions: No Hospitalized since last visit: No Patient Has Alerts: No Implantable device outside of the clinic excluding No cellular tissue based products placed in the center since last visit: Pain Present Now: No Electronic Signature(s) Signed: 12/31/2021 3:31:09 PM By: [name] CHT EMT BS , , Previous Signature: 12/31/2021 1:43:24 PM Version By: [name] Batman EMT Entered By: [name] on 12/31/2021 15:31:09 -------------------------------------------------------------------------------- Encounter Discharge Information Details Patient Name: Date of Service: [name], [name] 12/31/2021 10:00 A M Medical Record Number: [id number] Patient Account Number: [id number] Date of Birth/Sex: Treating RN: [birth year] (71 y.o. [name] Primary Care Brittinee Risk: [name] Other Clinician: [name] Batman Referring Sharisse Rantz: Treating Jaz Laningham/Extender: Tacey Ruiz in Treatment: 11 Encounter Discharge Information Items Discharge Condition: Stable Ambulatory Status: Ambulatory Discharge Destination: Home Transportation: Private Auto Accompanied By: None Schedule Follow-up Appointment: Yes Clinical Summary of Care: Electronic Signature(s) Signed: 12/31/2021 3:37:25 PM By: [name] CHT EMT BS , , Previous Signature: 12/31/2021 1:50:31 PM Version By: [name] Batman EMT Entered By: [name] on 12/31/2021 15:37:25 -------------------------------------------------------------------------------- Patient/Caregiver Education Details Patient Name: Date of Service: [name], [name] 1/10/2023andnbsp10:00 [place] Record Number: [id number] Patient Account Number: [id number] Date of Birth/Gender: Treating RN: [birth year] (71 y.o. [name] Primary Care Physician: [name] Other Clinician: [name] Batman Referring Physician: Treating Physician/Extender: Tacey Ruiz in Treatment: 11 Education Assessment Education Provided To: Patient Education Topics Provided Electronic Signature(s) Signed: 01/03/2022 8:34:43 AM By: [name] CHT EMT BS , , Previous Signature: 12/31/2021 1:50:03 PM Version By: [name] Batman EMT Entered By: [name] on 12/31/2021 15:37:12 -------------------------------------------------------------------------------- [place] Details Patient Name: Date of Service: [name], [name] 12/31/2021 10:00 A M Medical Record Number: [id number] Patient Account Number: [id number] Date of Birth/Sex: Treating RN: 07/05/1951 (71 y.o. [name] Primary Care Sharicka Pogorzelski: [name] Other Clinician: [name] Batman Referring Breeonna Mone: Treating Vieno Tarrant/Extender: Tacey Ruiz in Treatment: 11 Vital Signs Time Taken: 10:03 Temperature (F): 98.2 Height (in): 68 Pulse (bpm): 72 Weight (lbs):  127 Respiratory Rate (breaths/min): 16 Body Mass Index (BMI): 19.3 Blood Pressure (mmHg): 108/54 Reference Range: 80 - 120 mg / dl Airway Pulse Oximetry (%): 98 Electronic Signature(s) Signed: 12/31/2021 3:31:17 PM By: [name] CHT EMT BS , , Previous Signature: 12/31/2021 1:44:02 PM Version By: [name] Batman EMT Entered By: [name] on 12/31/2021 15:31:17

## 2021-12-31 NOTE — Progress Notes (Signed)
[name], [name] ([id number]) Visit Report for 12/31/2021 SuperBill Details Patient Name: Date of Service: [place], [name] 12/31/2021 Medical Record Number: [id number] Patient Account Number: [id number] Date of Birth/Sex: Treating RN: 1951/11/11 (71 y.o. 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, oral cavity, and pharynx Facility Procedures The patient participates with Medicare or their insurance follows the Medicare Facility Guidelines CPT4 Code Description Modifier Quantity 60600459 G0277-(Facility Use Only) HBOT full body chamber, 38min , 4 ICD-10 Diagnosis Description L59.8 Other specified disorders of the skin and subcutaneous tissue related to radiation Z51.0 Encounter for antineoplastic radiation therapy R13.19 Other dysphagia 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, oral cavity, and pharynx Physician Procedures Quantity CPT4 Code Description Modifier [id number] 39532 - WC PHYS HYPERBARIC OXYGEN THERAPY 1 ICD-10 Diagnosis Description L59.8 Other specified disorders of the skin and subcutaneous tissue related to radiation Z51.0 Encounter for antineoplastic radiation therapy R13.19 Other dysphagia 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

## 2022-01-01 ENCOUNTER — Other Ambulatory Visit: Payer: Self-pay

## 2022-01-01 ENCOUNTER — Encounter (HOSPITAL_BASED_OUTPATIENT_CLINIC_OR_DEPARTMENT_OTHER): Payer: Medicare Other | Admitting: Physician Assistant

## 2022-01-01 DIAGNOSIS — Z510 Encounter for antineoplastic radiation therapy: Secondary | ICD-10-CM | POA: Diagnosis not present

## 2022-01-01 DIAGNOSIS — Z85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, oral cavity, and pharynx: Secondary | ICD-10-CM | POA: Diagnosis not present

## 2022-01-01 DIAGNOSIS — R1319 Other dysphagia: Secondary | ICD-10-CM | POA: Diagnosis not present

## 2022-01-01 DIAGNOSIS — L598 Other specified disorders of the skin and subcutaneous tissue related to radiation: Secondary | ICD-10-CM | POA: Diagnosis not present

## 2022-01-01 NOTE — Progress Notes (Signed)
[name], [name] ([id number]) Visit Report for 01/01/2022 Problem List Details Patient Name: Date of Service: [place], [place]. 01/01/2022 10:00 A M Medical Record Number: [id number] Patient Account Number: [id number] Date of Birth/Sex: Treating RN: [birth year] (71 y.o. 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 01/01/2022 Medical Record Number: [id number] Patient Account Number: [id number] Date of Birth/Sex: Treating RN: November 13, 1951 (71 y.o. 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, oral cavity, and pharynx Facility Procedures The patient  participates with Medicare or their insurance follows the Medicare Facility Guidelines: CPT4 Code Description Modifier Quantity [id number] G0277-(Facility Use Only) HBOT full body chamber, 85min , 4 ICD-10 Diagnosis Description L59.8 Other  specified disorders of the skin and subcutaneous tissue related to radiation Z51.0 Encounter for antineoplastic radiation therapy R13.19 Other dysphagia 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, oral cavity, and pharynx Physician Procedures : CPT4 Code Description Modifier [id number] 30940 - WC PHYS HYPERBARIC OXYGEN THERAPY ICD-10 Diagnosis Description L59.8 Other specified disorders of the skin and subcutaneous tissue related to radiation Z51.0 Encounter for antineoplastic radiation therapy  R13.19 Other dysphagia 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

## 2022-01-01 NOTE — Progress Notes (Addendum)
STELMO, CATTANACH ([id number]) Visit Report for 01/01/2022 HBO Details Patient Name: Date of Service: [name], [name]. 01/01/2022 10:00 A M Medical Record Number: [id number] Patient Account Number: [id number] Date of Birth/Sex: Treating RN: [birth year] (71 y.o. 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 HBO Treatment Details Treatment Number: 27 Patient Type: Outpatient Chamber Type: Monoplace Chamber Serial #: [id number] Treatment Protocol: 2.0 ATA with 90 minutes oxygen, and no air breaks Treatment Details Compression Rate Down: 2.0 psi / minute De-Compression Rate Up: 2.0 psi / minute Air breaks and breathing Decompress Decompress Compress Tx Pressure Begins Reached periods Begins Ends (leave unused spaces blank) Chamber Pressure (ATA 1 2 ------2 1 ) Clock Time (24 hr) 10:24 10:32 - - - - - - 12:03 12:11 Treatment Length: 107 (minutes) Treatment Segments: 4 Vital Signs Capillary Blood Glucose Reference Range: 80 - 120 mg / dl HBO Diabetic Blood Glucose Intervention Range: <131 mg/dl or >606 mg/dl Time Vitals Blood Respiratory Capillary Blood Glucose Pulse Action Type: Pulse: Temperature: Taken: Pressure: Rate: Glucose (mg/dl): Meter #: Oximetry (%) Taken: Pre 09:51 100/48 80 16 98.2 100 Post 12:13 108/58 68 16 98.3 Treatment Response Treatment Toleration: Well Treatment Completion Status: Treatment Completed without Adverse Event Treatment Notes Paper version used during treatment. I certify that I directed the operation of said chamber for treatment today. M[place] Electronic Signature(s) Signed:  01/01/2022 2:39:11 PM By: Haywood Pao CHT EMT BS , , Signed: 01/01/2022 5:05:22 PM By: Lenda Kelp PA-C Previous Signature: 01/01/2022 1:16:41 PM Version By: [name] EMT Entered By: Haywood Pao on 01/01/2022 14:39:10 -------------------------------------------------------------------------------- HBO Safety Checklist Details Patient Name: Date of Service: [name] [name] 01/01/2022 10:00 A M Medical Record Number: [id number] Patient Account Number: [id number] Date of Birth/Sex: Treating RN: 17-Sep-1951 (71 y.o. [name], [name] Primary Care Casimira Sutphin: [name] Other Clinician: [name] Referring Shadell Brenn: Treating Pamula Luther/Extender: Roberts Gaudy, Tiffany Weeks in Treatment: 11 HBO Safety Checklist Items Safety Checklist Consent Form Signed Patient voided / foley secured and emptied When did you last eato 0730 Peg tube Last dose of injectable or oral agent NA Ostomy pouch emptied and vented if applicable NA All implantable devices assessed, documented and approved NA Intravenous access site secured and place NA Valuables secured Linens and cotton and cotton/polyester blend (less than 51% polyester) Personal oil-based products / skin lotions / body lotions removed Wigs or hairpieces removed NA Smoking or tobacco materials removed Books / newspapers / magazines / loose paper removed Cologne, aftershave, perfume and deodorant removed Jewelry removed (may wrap wedding band) NA Make-up removed NA Hair care products removed NA Battery operated devices (external) removed Heating patches and chemical warmers removed Titanium eyewear removed NA Nail polish cured greater than 10 hours NA Casting material cured greater than 10 hours NA Hearing aids removed NA Loose dentures or partials removed NA Prosthetics have been removed NA Patient demonstrates correct use of air break device (if applicable) Patient concerns have been  addressed Patient grounding bracelet on and cord attached to chamber Specifics for Inpatients (complete in addition to above) Medication sheet sent with patient NA Intravenous medications needed or due during therapy sent with patient NA Drainage tubes (e.g. nasogastric tube or chest tube secured and  vented) NA Endotracheal or Tracheotomy tube secured NA Cuff deflated of air and inflated with saline NA Airway suctioned NA Notes Paper version used prior to treatment. I certify that I directed the performance of the safety check for this treatment. [place] Electronic Signature(s) Signed: 01/01/2022 2:38:02 PM By: Haywood Pao CHT EMT BS , , Previous Signature: 01/01/2022 1:14:50 PM Version By: [name] EMT Entered By: Haywood Pao on 01/01/2022 14:38:02

## 2022-01-01 NOTE — Progress Notes (Addendum)
[name], [name] ([id number]) Visit Report for 01/01/2022 Arrival Information Details Patient Name: Date of Service: [name], [name] 01/01/2022 10:00 A M Medical Record Number: [id number] Patient Account Number: [id number] Date of Birth/Sex: Treating RN: 1951-07-10 (71 y.o. [name], [name] Primary Care Landri Dorsainvil: [name] Other Clinician: [name] Batman Referring Izaiah Tabb: Treating Marionna Gonia/Extender: Alinda Dooms, Tiffany Weeks in Treatment: 11 Visit Information History Since Last Visit All ordered tests and consults were completed: Yes Patient Arrived: Ambulatory Added or deleted any medications: No Arrival Time: 09:44 Any new allergies or adverse reactions: No Accompanied By: None Had a fall or experienced change in No Transfer Assistance: None activities of daily living that may affect Patient Identification Verified: Yes risk of falls: Secondary Verification Process Completed: Yes Signs or symptoms of abuse/neglect since last visito No Patient Requires Transmission-Based Precautions: No Hospitalized since last visit: No Patient Has Alerts: No Implantable device outside of the clinic excluding No cellular tissue based products placed in the center since last visit: Pain Present Now: No Electronic Signature(s) Signed: 01/01/2022 1:11:14 PM By: [name] Batman EMT Entered By: [name] Batman on 01/01/2022 13:11:14 -------------------------------------------------------------------------------- Encounter Discharge Information Details Patient Name: Date of Service: [name] Lango, [name] 01/01/2022 10:00 A M Medical Record Number: [id number] Patient Account Number: [id number] Date of Birth/Sex: Treating RN: 11-Apr-1951 (71 y.o. [name], [name] Primary Care Labrittany Wechter: [name] Other Clinician: [name] Batman Referring Maryah Marinaro: Treating Kaulder Zahner/Extender: Alinda Dooms, Tiffany Weeks in Treatment: 11 Encounter Discharge Information  Items Discharge Condition: Stable Ambulatory Status: Ambulatory Discharge Destination: Home Transportation: Private Auto Accompanied By: None Schedule Follow-up Appointment: Yes Clinical Summary of Care: Electronic Signature(s) Signed: 01/01/2022 1:18:55 PM By: [name] Batman EMT Entered By: [name] Batman on 01/01/2022 13:18:55 -------------------------------------------------------------------------------- Patient/Caregiver Education Details Patient Name: Date of Service: [name] Lango, [name] 1/11/2023andnbsp10:00 [place] Record Number: [id number] Patient Account Number: [id number] Date of Birth/Gender: Treating RN: 1951-11-13 (71 y.o. Erie Noe Primary Care Physician: [name] Other Clinician: [name] Batman Referring Physician: Treating Physician/Extender: Idalia Needle in Treatment: 11 Education Assessment Education Provided To: Patient Education Topics Provided Electronic Signature(s) Signed: 01/01/2022 1:18:20 PM By: [name] Batman EMT Entered By: [name] Batman on 01/01/2022 13:18:20 -------------------------------------------------------------------------------- Vitals Details Patient Name: Date of Service: [name] [place], [name] 01/01/2022 10:00 A M Medical Record Number: [id number] Patient Account Number: [id number] Date of Birth/Sex: Treating RN: 1951/08/08 (71 y.o. [name], [name] Primary Care Kassadee Carawan: [name] Other Clinician: [name] Batman Referring Griffin Gerrard: Treating Nykia Turko/Extender: Alinda Dooms, Tiffany Weeks in Treatment: 11 Vital Signs Time Taken: 09:51 Temperature (F): 98.2 Height (in): 68 Pulse (bpm): 80 Weight (lbs): 127 Respiratory Rate (breaths/min): 16 Body Mass Index (BMI): 19.3 Blood Pressure (mmHg): 100/48 Reference Range: 80 - 120 mg / dl Airway Pulse Oximetry (%): 100 Electronic Signature(s) Signed: 01/01/2022 1:11:52 PM By: [name] Batman EMT Entered By: [name] Batman on 01/01/2022 13:11:52

## 2022-01-02 ENCOUNTER — Encounter (HOSPITAL_BASED_OUTPATIENT_CLINIC_OR_DEPARTMENT_OTHER): Payer: Medicare Other | Admitting: Internal Medicine

## 2022-01-02 DIAGNOSIS — R1319 Other dysphagia: Secondary | ICD-10-CM | POA: Diagnosis not present

## 2022-01-02 DIAGNOSIS — Z85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, oral cavity, and pharynx: Secondary | ICD-10-CM | POA: Diagnosis not present

## 2022-01-02 DIAGNOSIS — Z510 Encounter for antineoplastic radiation therapy: Secondary | ICD-10-CM | POA: Diagnosis not present

## 2022-01-02 DIAGNOSIS — L598 Other specified disorders of the skin and subcutaneous tissue related to radiation: Secondary | ICD-10-CM | POA: Diagnosis not present

## 2022-01-02 NOTE — Progress Notes (Signed)
[name], [name] ([id number]) Visit Report for 01/02/2022 SuperBill Details Patient Name: Date of Service: [place], [name] 01/02/2022 Medical Record Number: [id number] Patient Account Number: [id number] Date of Birth/Sex: Treating RN: 1951/07/02 (71 y.o. 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, oral cavity, and pharynx Facility Procedures The patient participates with Medicare or their insurance follows the Medicare Facility Guidelines CPT4 Code Description Modifier Quantity [id number] G0277-(Facility Use Only) HBOT full body chamber, 33min , 4 ICD-10 Diagnosis Description L59.8 Other specified disorders of the skin and subcutaneous tissue related to radiation Z51.0 Encounter for antineoplastic radiation therapy R13.19 Other dysphagia 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, oral cavity, and pharynx Physician Procedures Quantity CPT4 Code Description Modifier [id number] 50354 - WC PHYS HYPERBARIC OXYGEN THERAPY 1 ICD-10 Diagnosis Description L59.8 Other specified disorders of the skin and subcutaneous tissue related to radiation Z51.0 Encounter for antineoplastic radiation therapy R13.19 Other dysphagia 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

## 2022-01-02 NOTE — Progress Notes (Signed)
KESHAN, REHA ([id number]) Visit Report for 01/02/2022 Arrival Information Details Patient Name: Date of Service: [place], [name] 01/02/2022 10:00 A M Medical Record Number: [id number] Patient Account Number: [id number] Date of Birth/Sex: Treating RN: 22-Dec-1951 (71 y.o. [name] Primary Care Ellenor Wisniewski: [name] Other Clinician: [name] Referring Leyna Vanderkolk: Treating Kolleen Ochsner/Extender: Tacey Ruiz in Treatment: 11 Visit Information History Since Last Visit All ordered tests and consults were completed: Yes Patient Arrived: Ambulatory Added or deleted any medications: No Arrival Time: 09:48 Any new allergies or adverse reactions: No Accompanied By: self Had a fall or experienced change in No Transfer Assistance: None activities of daily living that may affect Patient Identification Verified: Yes risk of falls: Secondary Verification Process Completed: Yes Signs or symptoms of abuse/neglect since last visito No Patient Requires Transmission-Based Precautions: No Hospitalized since last visit: No Patient Has Alerts: No Implantable device outside of the clinic excluding No cellular tissue based products placed in the center since last visit: Pain Present Now: No Electronic Signature(s) Signed: 01/02/2022 11:52:49 AM By: [name] CHT EMT BS , , Entered By: [name] on 01/02/2022 11:52:49 -------------------------------------------------------------------------------- Encounter Discharge Information Details Patient Name: Date of Service: [name], [name] 01/02/2022 10:00 A M Medical Record Number: [id number] Patient Account Number: [id number] Date of Birth/Sex: Treating RN: 03/24/1951 (71 y.o. [name] Primary Care Jeray Shugart: [name] Other Clinician: [name] Referring Marquez Ceesay: Treating Cori Henningsen/Extender: Tacey Ruiz in Treatment: 11 Encounter Discharge  Information Items Discharge Condition: Stable Ambulatory Status: Ambulatory Discharge Destination: Home Transportation: Private Auto Accompanied By: self Schedule Follow-up Appointment: No Clinical Summary of Care: Electronic Signature(s) Signed: 01/02/2022 1:09:50 PM By: [name] CHT EMT BS , , Entered By: [name] on 01/02/2022 13:09:50 -------------------------------------------------------------------------------- [place] Details Patient Name: Date of Service: [name], [name] 01/02/2022 10:00 A M Medical Record Number: [id number] Patient Account Number: [id number] Date of Birth/Sex: Treating RN: Feb 17, 1951 (71 y.o. [name] Primary Care Jaking Thayer: [name] Other Clinician: [name] Referring Marilyn Nihiser: Treating Yasaman Kolek/Extender: Tacey Ruiz in Treatment: 11 Vital Signs Time Taken: 10:35 Temperature (F): 99 Height (in): 68 Pulse (bpm): 83 Weight (lbs): 127 Respiratory Rate (breaths/min): 16 Body Mass Index (BMI): 19.3 Blood Pressure (mmHg): 113/57 Reference Range: 80 - 120 mg / dl Electronic Signature(s) Signed: 01/02/2022 11:53:36 AM By: [name] CHT EMT BS , , Entered By: [name] on 01/02/2022 11:53:36

## 2022-01-03 ENCOUNTER — Encounter (HOSPITAL_BASED_OUTPATIENT_CLINIC_OR_DEPARTMENT_OTHER): Payer: Medicare Other | Admitting: Internal Medicine

## 2022-01-03 ENCOUNTER — Other Ambulatory Visit: Payer: Self-pay

## 2022-01-03 DIAGNOSIS — Z85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, oral cavity, and pharynx: Secondary | ICD-10-CM

## 2022-01-03 DIAGNOSIS — L598 Other specified disorders of the skin and subcutaneous tissue related to radiation: Secondary | ICD-10-CM

## 2022-01-03 DIAGNOSIS — R1319 Other dysphagia: Secondary | ICD-10-CM | POA: Diagnosis not present

## 2022-01-03 DIAGNOSIS — Z510 Encounter for antineoplastic radiation therapy: Secondary | ICD-10-CM | POA: Diagnosis not present

## 2022-01-03 NOTE — Progress Notes (Signed)
[name], [name] ([id number]) Visit Report for 01/02/2022 HBO Details Patient Name: Date of Service: [place], [name]. 01/02/2022 10:00 A M Medical Record Number: [id number] Patient Account Number: [id number] Date of Birth/Sex: Treating RN: April 01, 1951 (71 y.o. 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 HBO Treatment Details Treatment Number: 28 Patient Type: Outpatient Chamber Type: Monoplace Chamber Serial #: [id number] Treatment Protocol: 2.0 ATA with 90 minutes oxygen, and no air breaks Treatment Details Compression Rate Down: 2.0 psi / minute De-Compression Rate Up: 2.0 psi / minute Air breaks and breathing Decompress Decompress Compress Tx Pressure Begins Reached periods Begins Ends (leave unused spaces blank) Chamber Pressure (ATA 1 2 ------2 1 ) Clock Time (24 hr) 10:43 10:52 - - - - - - 12:22 12:30 Treatment Length: 107 (minutes) Treatment Segments: 4 Vital Signs Capillary Blood Glucose Reference Range: 80 - 120 mg / dl HBO Diabetic Blood Glucose Intervention Range: <131 mg/dl or >102 mg/dl Time Vitals Blood Respiratory Capillary Blood Glucose Pulse Action Type: Pulse: Temperature: Taken: Pressure: Rate: Glucose (mg/dl): Meter #: Oximetry (%) Taken: Pre 10:35 113/57 83 16 99 100 Post 12:33 118/58 71 16 98.4 100 Treatment Response Treatment Toleration: Well Treatment Completion Status: Treatment Completed without Adverse Event Clois Treanor Notes No concerns with treatment given Physician HBO Attestation: I certify that I supervised this HBO treatment in accordance with Medicare guidelines. A  trained emergency response team is readily available per Yes hospital policies and procedures. Continue HBOT as ordered. Yes Electronic Signature(s) Signed: 01/02/2022 4:29:39 PM By: [name] MD Previous Signature: 01/02/2022 1:08:40 PM Version By: Haywood Pao CHT EMT BS , , Entered By: [name] on 01/02/2022 16:26:13 -------------------------------------------------------------------------------- HBO Safety Checklist Details Patient Name: Date of Service: [name], [name] [name] 01/02/2022 10:00 A M Medical Record Number: [id number] Patient Account Number: [id number] Date of Birth/Sex: Treating RN: March 16, 1951 (71 y.o. [name] Primary Care [name]: [name] Other Clinician: Haywood Pao Referring Chelise Hanger: Treating Claryssa Sandner/Extender: [name] in Treatment: 11 HBO Safety Checklist Items Safety Checklist Consent Form Signed Patient voided / foley secured and emptied When did you last eato 0730 Last dose of injectable or oral agent n/a Ostomy pouch emptied and vented if applicable NA All implantable devices assessed, documented and approved G tube proper placement, saline Intravenous access site secured and place NA Valuables secured Linens and cotton and cotton/polyester blend (less than 51% polyester) Personal oil-based products / skin lotions / body lotions removed Wigs or hairpieces removed NA Smoking or tobacco materials removed NA Books / newspapers / magazines / loose paper removed Cologne, aftershave, perfume and deodorant removed Jewelry removed (may wrap wedding band) Make-up removed NA Hair care products removed Battery operated devices (external) removed Heating patches and chemical warmers removed Titanium eyewear removed NA Not wearing eyewear in chamber (nickel) Nail polish cured greater than 10 hours NA Casting material cured greater than 10 hours NA Hearing aids removed NA Loose  dentures or partials removed NA Prosthetics have been removed NA Patient demonstrates correct use of air break device (if applicable) Patient concerns have been addressed Patient grounding bracelet on and cord attached to chamber Specifics for Inpatients (complete in addition to above) Medication sheet sent with patient NA Intravenous medications needed  or due during therapy sent with patient NA Drainage tubes (e.g. nasogastric tube or chest tube secured and vented) NA Endotracheal or Tracheotomy tube secured NA Cuff deflated of air and inflated with saline NA Airway suctioned NA Notes Paper version used prior to treatment. Electronic Signature(s) Signed: 01/03/2022 8:34:43 AM By: Haywood Pao CHT EMT BS , , Entered By: Haywood Pao on 01/02/2022 11:55:30

## 2022-01-03 NOTE — Progress Notes (Signed)
[name], [name] ([id number]) Visit Report for 01/03/2022 HBO Details Patient Name: Date of Service: [name], [name]. 01/03/2022 10:00 A M Medical Record Number: [id number] Patient Account Number: [id number] Date of Birth/Sex: Treating RN: Jan 03, 1951 (72 y.o. 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 HBO Treatment Details Treatment Number: 29 Patient Type: Outpatient Chamber Type: Monoplace Chamber Serial #: [id number] Treatment Protocol: 2.0 ATA with 90 minutes oxygen, and no air breaks Treatment Details Compression Rate Down: 2.0 psi / minute De-Compression Rate Up: 2.0 psi / minute Air breaks and breathing Decompress Decompress Compress Tx Pressure Begins Reached periods Begins Ends (leave unused spaces blank) Chamber Pressure (ATA 1 2 ------2 1 ) Clock Time (24 hr) 10:22 10:30 - - - - - - 12:00 12:08 Treatment Length: 106 (minutes) Treatment Segments: 4 Vital Signs Capillary Blood Glucose Reference Range: 80 - 120 mg / dl HBO Diabetic Blood Glucose Intervention Range: <131 mg/dl or >536 mg/dl Time Vitals Blood Respiratory Capillary Blood Glucose Pulse Action Type: Pulse: Temperature: Taken: Pressure: Rate: Glucose (mg/dl): Meter #: Oximetry (%) Taken: Pre 09:46 104/46 83 16 98.3 100 Post 12:10 112/54 71 16 98.4 100 Treatment Response Treatment Toleration: Well Treatment Completion Status: Treatment Completed without Adverse Event Physician HBO Attestation: I certify that I supervised this HBO treatment in accordance with Medicare guidelines. A trained emergency response team is readily available  per Yes hospital policies and procedures. Continue HBOT as ordered. Yes Electronic Signature(s) Signed: 01/03/2022 12:30:10 PM By: [name] [name] DO Entered By: [name] on 01/03/2022 12:29:21 -------------------------------------------------------------------------------- HBO Safety Checklist Details Patient Name: Date of Service: [name], [name] [name] 01/03/2022 10:00 A M Medical Record Number: [id number] Patient Account Number: [id number] Date of Birth/Sex: Treating RN: [birth year] (71 y.o. [name], [name] Primary Care [name]: [name] Other Clinician: Haywood Juarez Referring [name]: Treating [name]/Extender: [name] Weeks in Treatment: 11 HBO Safety Checklist Items Safety Checklist Consent Form Signed Patient voided / foley secured and emptied When did you last eato 0730 Last dose of injectable or oral agent n/a Ostomy pouch emptied and vented if applicable NA All implantable devices assessed, documented and approved G tube proper placement, saline Intravenous access site secured and place NA Valuables secured Linens and cotton and cotton/polyester blend (less than 51% polyester) Personal oil-based products / skin lotions / body lotions removed Wigs or hairpieces removed NA Smoking or tobacco materials removed NA Books / newspapers / magazines / loose paper removed Cologne, aftershave, perfume and deodorant removed Jewelry removed (may wrap wedding band) Make-up removed NA Hair care products removed Battery operated devices (external) removed Heating patches and chemical warmers removed Titanium eyewear removed NA Nail polish cured greater than 10 hours NA Casting material cured greater than 10 hours NA Hearing aids removed NA Loose dentures or partials removed NA Prosthetics have been removed NA Patient demonstrates correct use of air break device (if applicable) Patient concerns have been addressed Patient  grounding bracelet on and cord attached to chamber Specifics for Inpatients (complete in addition to above) Medication sheet sent with patient NA Intravenous medications needed or due during therapy sent with patient NA Drainage tubes (e.g. nasogastric tube or chest tube secured and vented) NA Endotracheal or Tracheotomy tube secured NA Cuff  deflated of air and inflated with saline NA Airway suctioned NA Notes Paper version used prior to treatment. Electronic Signature(s) Signed: 01/03/2022 12:56:36 PM By: [name] CHT EMT BS , , Entered By: [name] on 01/03/2022 12:55:47

## 2022-01-03 NOTE — Progress Notes (Signed)
MARQUIS, DOWN ([id number]) Visit Report for 01/03/2022 Arrival Information Details Patient Name: Date of Service: [place], [name] 01/03/2022 10:00 A M Medical Record Number: [id number] Patient Account Number: [id number] Date of Birth/Sex: Treating RN: 1951/08/29 (71 y.o. [name], Meta.Reding Primary Care Ross Bender: [name] Other Clinician: [name] Referring Libia Fazzini: Treating Shae Augello/Extender: [name] in Treatment: 11 Visit Information History Since Last Visit All ordered tests and consults were completed: Yes Patient Arrived: Ambulatory Added or deleted any medications: No Arrival Time: 09:34 Any new allergies or adverse reactions: No Accompanied By: self Had a fall or experienced change in No Transfer Assistance: None activities of daily living that may affect Patient Identification Verified: Yes risk of falls: Secondary Verification Process Completed: Yes Signs or symptoms of abuse/neglect since last visito No Patient Requires Transmission-Based Precautions: No Hospitalized since last visit: No Patient Has Alerts: No Implantable device outside of the clinic excluding No cellular tissue based products placed in the center since last visit: Pain Present Now: No Electronic Signature(s) Signed: 01/03/2022 12:56:36 PM By: [name] CHT EMT BS , , Entered By: [name] on 01/03/2022 12:01:30 -------------------------------------------------------------------------------- Encounter Discharge Information Details Patient Name: Date of Service: [place], [name] [name] 01/03/2022 10:00 A M Medical Record Number: [id number] Patient Account Number: [id number] Date of Birth/Sex: Treating RN: 10-Jan-1951 (71 y.o. [name] Primary Care Saryna Kneeland: [name] Other Clinician: [name] Referring Thelbert Gartin: Treating Dyana Magner/Extender: [name] in Treatment: 11 Encounter Discharge  Information Items Discharge Condition: Stable Ambulatory Status: Ambulatory Discharge Destination: Home Transportation: Private Auto Accompanied By: self Schedule Follow-up Appointment: No Clinical Summary of Care: Electronic Signature(s) Signed: 01/03/2022 12:56:36 PM By: [name] CHT EMT BS , , Entered By: [name] on 01/03/2022 12:22:51 -------------------------------------------------------------------------------- Vitals Details Patient Name: Date of Service: [name], [name] 01/03/2022 10:00 A M Medical Record Number: [id number] Patient Account Number: [id number] Date of Birth/Sex: Treating RN: 1951-12-20 (71 y.o. [name] Primary Care Fedrick Cefalu: [name] Other Clinician: [name] Batman Referring Rashauna Tep: Treating Lynton Crescenzo/Extender: [name] Weeks in Treatment: 11 Vital Signs Time Taken: 09:46 Temperature (F): 98.3 Height (in): 68 Pulse (bpm): 83 Weight (lbs): 127 Respiratory Rate (breaths/min): 16 Body Mass Index (BMI): 19.3 Blood Pressure (mmHg): 104/46 Reference Range: 80 - 120 mg / dl Airway Pulse Oximetry (%): 100 Electronic Signature(s) Signed: 01/03/2022 12:56:36 PM By: [name] CHT EMT BS , , Entered By: [name] on 01/03/2022 12:03:20

## 2022-01-03 NOTE — Progress Notes (Signed)
[name], [name] ([id number]) Visit Report for 01/03/2022 SuperBill Details Patient Name: Date of Service: [place], [name] 01/03/2022 Medical Record Number: [id number] Patient Account Number: [id number] Date of Birth/Sex: Treating RN: October 05, 1951 (70 y.o. 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, oral cavity, and pharynx Facility Procedures The patient participates with Medicare or their insurance follows the Medicare Facility Guidelines CPT4 Code Description Modifier Quantity [id number] G0277-(Facility Use Only) HBOT full body chamber, 10min , 4 ICD-10 Diagnosis Description L59.8 Other specified disorders of the skin and subcutaneous tissue related to radiation Z51.0 Encounter for antineoplastic radiation therapy R13.19 Other dysphagia 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, oral cavity, and pharynx Physician Procedures Quantity CPT4 Code Description Modifier [id number] - WC PHYS HYPERBARIC OXYGEN THERAPY 1 ICD-10 Diagnosis Description L59.8 Other specified disorders of the skin and subcutaneous tissue related to radiation Z51.0 Encounter for antineoplastic radiation therapy R13.19 Other dysphagia 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

## 2022-01-06 ENCOUNTER — Other Ambulatory Visit: Payer: Self-pay

## 2022-01-06 ENCOUNTER — Encounter (HOSPITAL_BASED_OUTPATIENT_CLINIC_OR_DEPARTMENT_OTHER): Payer: Medicare Other | Admitting: Internal Medicine

## 2022-01-06 DIAGNOSIS — Z510 Encounter for antineoplastic radiation therapy: Secondary | ICD-10-CM | POA: Diagnosis not present

## 2022-01-06 DIAGNOSIS — L598 Other specified disorders of the skin and subcutaneous tissue related to radiation: Secondary | ICD-10-CM

## 2022-01-06 DIAGNOSIS — Z85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, oral cavity, and pharynx: Secondary | ICD-10-CM | POA: Diagnosis not present

## 2022-01-06 DIAGNOSIS — R1319 Other dysphagia: Secondary | ICD-10-CM | POA: Diagnosis not present

## 2022-01-07 ENCOUNTER — Encounter (HOSPITAL_BASED_OUTPATIENT_CLINIC_OR_DEPARTMENT_OTHER): Payer: Medicare Other | Admitting: Internal Medicine

## 2022-01-07 DIAGNOSIS — L598 Other specified disorders of the skin and subcutaneous tissue related to radiation: Secondary | ICD-10-CM | POA: Diagnosis not present

## 2022-01-07 DIAGNOSIS — R1319 Other dysphagia: Secondary | ICD-10-CM | POA: Diagnosis not present

## 2022-01-07 DIAGNOSIS — Z510 Encounter for antineoplastic radiation therapy: Secondary | ICD-10-CM | POA: Diagnosis not present

## 2022-01-07 DIAGNOSIS — Z85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, oral cavity, and pharynx: Secondary | ICD-10-CM | POA: Diagnosis not present

## 2022-01-07 NOTE — Progress Notes (Signed)
[name], [name] ([id number]) Visit Report for 01/07/2022 Problem List Details Patient Name: Date of Service: [place], [place]. 01/07/2022 10:00 A M Medical Record Number: [id number] Patient Account Number: [id number] Date of Birth/Sex: Treating RN: August 15, 1951 (71 y.o. 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 01/07/2022 Medical Record Number: [id number] Patient Account Number: [id number] Date of Birth/Sex: Treating RN: 1951-07-21 (71 y.o. 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, oral cavity, and pharynx Facility Procedures The patient  participates with Medicare or their insurance follows the Medicare Facility Guidelines: CPT4 Code Description Modifier Quantity [id number] G0277-(Facility Use Only) HBOT full body chamber, 70min , 4 ICD-10 Diagnosis Description L59.8 Other  specified disorders of the skin and subcutaneous tissue related to radiation Z51.0 Encounter for antineoplastic radiation therapy R13.19 Other dysphagia 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, oral cavity, and pharynx Physician Procedures : CPT4 Code Description Modifier [id number] - WC PHYS HYPERBARIC OXYGEN THERAPY ICD-10 Diagnosis Description L59.8 Other specified disorders of the skin and subcutaneous tissue related to radiation Z51.0 Encounter for antineoplastic radiation therapy  R13.19 Other dysphagia 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

## 2022-01-07 NOTE — Progress Notes (Signed)
[name], [name] ([id number]) Visit Report for 01/06/2022 Arrival Information Details Patient Name: Date of Service: [place], [name] 01/06/2022 10:00 A M Medical Record Number: [id number] Patient Account Number: [id number] Date of Birth/Sex: Treating RN: 09/30/1951 (71 y.o. [name] Primary Care [name]: [name] Other Clinician: Donavan Juarez Referring [name]: Treating [name]/Extender: [name] in Treatment: 11 Visit Information History Since Last Visit All ordered tests and consults were completed: Yes Patient Arrived: Ambulatory Added or deleted any medications: No Arrival Time: 12:24 Any new allergies or adverse reactions: No Accompanied By: self Had a fall or experienced change in No Transfer Assistance: None activities of daily living that may affect Patient Requires Transmission-Based Precautions: No risk of falls: Patient Has Alerts: No Signs or symptoms of abuse/neglect since last visito No Hospitalized since last visit: No Implantable device outside of the clinic excluding No cellular tissue based products placed in the center since last visit: Pain Present Now: Yes Notes Patient states that his neck hurts, 7 of 10. Electronic Signature(s) Signed: 01/07/2022 10:16:42 AM By: [name] CHT EMT BS , , Entered By: [name] on 01/06/2022 12:25:44 -------------------------------------------------------------------------------- Encounter Discharge Information Details Patient Name: Date of Service: [place], [name] [name] 01/06/2022 10:00 A M Medical Record Number: [id number] Patient Account Number: [id number] Date of Birth/Sex: Treating RN: Jun 28, 1951 (70 y.o. [name] Primary Care [name]: [name] Other Clinician: Donavan Juarez Referring [name]: Treating [name]/Extender: [name] in Treatment: 11 Encounter Discharge Information Items Discharge  Condition: Stable Ambulatory Status: Ambulatory Discharge Destination: Home Transportation: Private Auto Accompanied By: self Schedule Follow-up Appointment: No Clinical Summary of Care: Electronic Signature(s) Signed: 01/07/2022 10:16:42 AM By: [name] CHT EMT BS , , Entered By: [name] on 01/06/2022 12:36:48 -------------------------------------------------------------------------------- [place] Details Patient Name: Date of Service: [name], [name] 01/06/2022 10:00 A M Medical Record Number: [id number] Patient Account Number: [id number] Date of Birth/Sex: Treating RN: [birth year] (71 y.o. [name] Primary Care [name]: [name] Other Clinician: Donavan Juarez Referring [name]: Treating [name]/Extender: [name] Weeks in Treatment: 11 Vital Signs Time Taken: 11:47 Temperature (F): 98.3 Height (in): 68 Pulse (bpm): 70 Weight (lbs): 127 Respiratory Rate (breaths/min): 18 Body Mass Index (BMI): 19.3 Blood Pressure (mmHg): 108/52 Reference Range: 80 - 120 mg / dl Airway Pulse Oximetry (%): 100 Electronic Signature(s) Signed: 01/06/2022 12:31:07 PM By: [name] CHT EMT BS , , Entered By: [name] on 01/06/2022 12:31:07

## 2022-01-07 NOTE — Progress Notes (Signed)
[name], Juarez ([id number]) Visit Report for 01/07/2022 Arrival Information Details Patient Name: Date of Service: [name], [name] 01/07/2022 10:00 A M Medical Record Number: [id number] Patient Account Number: [id number] Date of Birth/Sex: Treating RN: Jan 06, 1951 (71 y.o. [name] Primary Care Markos Theil: [name] Other Clinician: [name] Batman Referring Gioia Ranes: Treating Merri Dimaano/Extender: [name] in Treatment: 12 Visit Information History Since Last Visit All ordered tests and consults were completed: Yes Patient Arrived: Ambulatory Added or deleted any medications: No Arrival Time: 09:29 Any new allergies or adverse reactions: No Accompanied By: None Had a fall or experienced change in No Transfer Assistance: None activities of daily living that may affect Patient Identification Verified: Yes risk of falls: Secondary Verification Process Completed: Yes Signs or symptoms of abuse/neglect since last visito No Patient Requires Transmission-Based Precautions: No Hospitalized since last visit: No Patient Has Alerts: No Implantable device outside of the clinic excluding No cellular tissue based products placed in the center since last visit: Pain Present Now: No Electronic Signature(s) Signed: 01/07/2022 1:00:40 PM By: [name] Batman EMT Entered By: [name] Batman on 01/07/2022 13:00:40 -------------------------------------------------------------------------------- Encounter Discharge Information Details Patient Name: Date of Service: [name] [place], [name] 01/07/2022 10:00 A M Medical Record Number: [id number] Patient Account Number: [id number] Date of Birth/Sex: Treating RN: 1951-06-20 (71 y.o. [name] Primary Care [name]: [name] Other Clinician: [name] Batman Referring Lydie Stammen: Treating Ishitha Roper/Extender: [name] in Treatment: 12 Encounter Discharge Information Items Discharge  Condition: Stable Ambulatory Status: Ambulatory Discharge Destination: Home Transportation: Private Auto Accompanied By: None Schedule Follow-up Appointment: Yes Clinical Summary of Care: Patient Declined Electronic Signature(s) Signed: 01/07/2022 1:06:56 PM By: [name] Batman EMT Entered By: [name] Batman on 01/07/2022 13:06:56 -------------------------------------------------------------------------------- Patient/Caregiver Education Details Patient Name: Date of Service: [name] [place], [name] 1/17/2023andnbsp10:00 [place] Record Number: [id number] Patient Account Number: [id number] Date of Birth/Gender: Treating RN: 11-Dec-1951 (71 y.o. [name] Primary Care Physician: [name] Other Clinician: [name] Batman Referring Physician: Treating Physician/Extender: [name] in Treatment: 12 Education Assessment Education Provided To: Patient Education Topics Provided Electronic Signature(s) Signed: 01/07/2022 1:06:37 PM By: [name] Batman EMT Entered By: [name] Batman on 01/07/2022 13:06:37 -------------------------------------------------------------------------------- [place] Details Patient Name: Date of Service: [name] [place]ango, [name] 01/07/2022 10:00 A M Medical Record Number: [id number] Patient Account Number: [id number] Date of Birth/Sex: Treating RN: 12/16/1951 (71 y.o. [name] Primary Care [name]: [name] Other Clinician: [name] Batman Referring Jordynn Marcella: Treating Mouhamad Teed/Extender: [name] in Treatment: 12 Vital Signs Time Taken: 09:43 Temperature (F): 98.3 Height (in): 68 Pulse (bpm): 77 Weight (lbs): 127 Respiratory Rate (breaths/min): 16 Body Mass Index (BMI): 19.3 Blood Pressure (mmHg): 108/48 Reference Range: 80 - 120 mg / dl Airway Pulse Oximetry (%): 100 Electronic Signature(s) Signed: 01/07/2022 1:01:12 PM By: [name] Batman EMT Entered By: [name] Batman on  01/07/2022 13:01:12

## 2022-01-07 NOTE — Progress Notes (Signed)
ABE, REINE ([id number]) Visit Report for 01/06/2022 HBO Details Patient Name: Date of Service: [place], [name]. 01/06/2022 10:00 A M Medical Record Number: [id number] Patient Account Number: [id number] Date of Birth/Sex: Treating RN: 1951/02/23 (71 y.o. 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 HBO Treatment Details Treatment Number: 30 Patient Type: Outpatient Chamber Type: Monoplace Chamber Serial #: [id number] Treatment Protocol: 2.0 ATA with 90 minutes oxygen, and no air breaks Treatment Details Compression Rate Down: 2.0 psi / minute De-Compression Rate Up: 2.0 psi / minute Air breaks and breathing Decompress Decompress Compress Tx Pressure Begins Reached periods Begins Ends (leave unused spaces blank) Chamber Pressure (ATA 1 2 ------2 1 ) Clock Time (24 hr) 09:57 10:05 - - - - - - 11:35 11:44 Treatment Length: 107 (minutes) Treatment Segments: 4 Vital Signs Capillary Blood Glucose Reference Range: 80 - 120 mg / dl HBO Diabetic Blood Glucose Intervention Range: <131 mg/dl or >401 mg/dl Time Vitals Blood Respiratory Capillary Blood Glucose Pulse Action Type: Pulse: Temperature: Taken: Pressure: Rate: Glucose (mg/dl): Meter #: Oximetry (%) Taken: Pre 09:46 110/48 88 18 97.8 100 Post 11:47 108/52 70 18 98.3 100 Treatment Response Treatment Toleration: Well Treatment Completion Status: Treatment Completed without Adverse Event Physician HBO Attestation: I certify that I supervised this HBO treatment in accordance with Medicare guidelines. A trained emergency response team is readily available  per Yes hospital policies and procedures. Continue HBOT as ordered. Yes Electronic Signature(s) Signed: 01/07/2022 9:05:14 AM By: Geralyn [name] DO Entered By: Geralyn [name] on 01/06/2022 15:20:05 -------------------------------------------------------------------------------- HBO Safety Checklist Details Patient Name: Date of Service: [place], [name] 01/06/2022 10:00 A M Medical Record Number: [id number] Patient Account Number: [id number] Date of Birth/Sex: Treating RN: February 07, 1951 (71 y.o. [name] Primary Care Dae Antonucci: [name] Other Clinician: Haywood Pao Referring Simi Briel: Treating Rennie Hack/Extender: Cristopher Peru Weeks in Treatment: 11 HBO Safety Checklist Items Safety Checklist Consent Form Signed Patient voided / foley secured and emptied When did you last eato 0730 Last dose of injectable or oral agent n/a Ostomy pouch emptied and vented if applicable NA All implantable devices assessed, documented and approved G tube proper placement, saline Intravenous access site secured and place NA Valuables secured Linens and cotton and cotton/polyester blend (less than 51% polyester) Personal oil-based products / skin lotions / body lotions removed Wigs or hairpieces removed NA Smoking or tobacco materials removed NA Books / newspapers / magazines / loose paper removed Cologne, aftershave, perfume and deodorant removed Jewelry removed (may wrap wedding band) Make-up removed NA Hair care products removed Battery operated devices (external) removed Heating patches and chemical warmers removed Titanium eyewear removed NA Not wearing eyewear in chamber (nickel) Nail polish cured greater than 10 hours NA Casting material cured greater than 10 hours NA Hearing aids removed Patient has new hearing aids Loose dentures or partials removed NA Prosthetics have been removed NA Patient demonstrates correct use of air break  device (if applicable) Patient concerns have been addressed Patient grounding bracelet on and cord attached to chamber Specifics for Inpatients (complete in addition to above) Medication sheet sent with patient NA Intravenous medications needed or due during therapy sent with patient NA Drainage tubes (e.g. nasogastric tube or chest tube secured  and vented) NA Endotracheal or Tracheotomy tube secured NA Cuff deflated of air and inflated with saline NA Airway suctioned NA Notes Paper version used prior to treatment. Electronic Signature(s) Signed: 01/07/2022 10:16:42 AM By: Haywood Pao CHT EMT BS , , Entered By: Haywood Pao on 01/06/2022 12:34:18

## 2022-01-07 NOTE — Progress Notes (Signed)
MARKIAN, GLOCKNER ([id number]) Visit Report for 01/06/2022 [place] Details Patient Name: Date of Service: [place], [name] 01/06/2022 Medical Record Number: [id number] Patient Account Number: [id number] Date of Birth/Sex: Treating RN: February 04, 1951 (70 y.o. 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, oral cavity, and pharynx Facility Procedures The patient participates with Medicare or their insurance follows the [place] Code Description Modifier Quantity [id number] G0277-(Facility Use Only) HBOT full body chamber, 39min , 4 ICD-10 Diagnosis Description L59.8 Other specified disorders of the skin and subcutaneous tissue related to radiation Z51.0 Encounter for antineoplastic radiation therapy R13.19 Other dysphagia Physician Procedures Quantity CPT4 Code Description Modifier [id number] - WC PHYS HYPERBARIC OXYGEN THERAPY 1 ICD-10 Diagnosis Description L59.8 Other specified disorders of the skin and subcutaneous tissue related to radiation Z51.0 Encounter for antineoplastic radiation therapy R13.19 Other dysphagia Electronic Signature(s) Signed: 01/07/2022 9:05:14 AM By: Kalman Shan DO Signed: 01/07/2022 10:16:42 AM By: [name] CHT EMT BS , , Entered By: [name] on 01/06/2022 85:46:27

## 2022-01-07 NOTE — Progress Notes (Addendum)
[name], [name] ([id number]) Visit Report for 01/07/2022 HBO Details Patient Name: Date of Service: [place], [name]. 01/07/2022 10:00 A M Medical Record Number: [id number] Patient Account Number: [id number] Date of Birth/Sex: Treating RN: 06/22/1951 (71 y.o. 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 HBO Treatment Details Treatment Number: 31 Patient Type: Outpatient Chamber Type: Monoplace Chamber Serial #: [id number] Treatment Protocol: 2.0 ATA with 90 minutes oxygen, and no air breaks Treatment Details Air breaks and breathing Decompress Decompress Compress Tx Pressure Begins Reached periods Begins Ends (leave unused spaces blank) Chamber Pressure (ATA 1 2 ------2 1 ) Clock Time (24 hr) 09:55 10:02 - - - - - - 11:33 11:41 Treatment Length: 106 (minutes) Treatment Segments: 4 Vital Signs Capillary Blood Glucose Reference Range: 80 - 120 mg / dl HBO Diabetic Blood Glucose Intervention Range: <131 mg/dl or >086 mg/dl Time Vitals Blood Respiratory Capillary Blood Glucose Pulse Action Type: Pulse: Temperature: Taken: Pressure: Rate: Glucose (mg/dl): Meter #: Oximetry (%) Taken: Pre 09:43 108/48 77 16 98.3 100 Post 11:55 108/80 72 16 98.3 100 Treatment Response Treatment Toleration: Well Treatment Completion Status: Treatment Completed without Adverse Event Treatment Notes Information entered from paper intake sheet. . Danamarie Minami Notes No concerns with treatment given Physician HBO Attestation: I certify that I supervised this HBO treatment in accordance with Medicare guidelines. A trained emergency  response team is readily available per Yes hospital policies and procedures. Continue HBOT as ordered. Yes Electronic Signature(s) Signed: 01/07/2022 4:48:32 PM By: [name] MD Previous Signature: 01/07/2022 1:05:28 PM Version By: [name] EMT Entered By: [name] on 01/07/2022 15:15:35 -------------------------------------------------------------------------------- HBO Safety Checklist Details Patient Name: Date of Service: [place], [name] 01/07/2022 10:00 A M Medical Record Number: [id number] Patient Account Number: [id number] Date of Birth/Sex: Treating RN: [birth year] (71 y.o. [name] Primary Care Kemani Heidel: [name] Other Clinician: [name] Referring Lathon Adan: Treating Tambra Muller/Extender: [name] in Treatment: 12 HBO Safety Checklist Items Safety Checklist Consent Form Signed Patient voided / foley secured and emptied When did you last eato 0730 Peg tube Last dose of injectable or oral agent NA Ostomy pouch emptied and vented if applicable NA All implantable devices assessed, documented and approved Intravenous access site secured and place NA Valuables secured Linens and cotton and cotton/polyester blend (less than 51% polyester) Personal oil-based products / skin lotions / body lotions removed Wigs or hairpieces removed NA Smoking or tobacco materials removed Books / newspapers / magazines / loose paper removed Cologne, aftershave, perfume and deodorant removed Jewelry removed (may wrap wedding band) NA Make-up removed NA Hair care products removed NA Battery operated devices (external) removed Heating patches and chemical warmers removed Titanium eyewear removed NA Nail polish cured greater than 10 hours NA Casting material cured greater than 10 hours NA Hearing aids removed NA Loose dentures or partials removed NA Prosthetics have been removed NA Patient demonstrates correct use  of air break device (if applicable) Patient concerns have been addressed Patient grounding bracelet on and cord attached to chamber Specifics for Inpatients (complete in addition to above) Medication sheet sent with patient NA Intravenous medications needed or due during therapy sent with patient NA Drainage tubes (e.g. nasogastric tube or chest tube secured  and vented) NA Endotracheal or Tracheotomy tube secured NA Cuff deflated of air and inflated with saline NA Airway suctioned NA Notes Information entered from paper intake sheet. Electronic Signature(s) Signed: 01/07/2022 1:07:57 PM By: [name] EMT Previous Signature: 01/07/2022 1:02:31 PM Version By: [name] EMT Entered By: [name] on 01/07/2022 13:07:57

## 2022-01-08 ENCOUNTER — Encounter (HOSPITAL_BASED_OUTPATIENT_CLINIC_OR_DEPARTMENT_OTHER): Payer: Medicare Other | Admitting: Internal Medicine

## 2022-01-08 ENCOUNTER — Other Ambulatory Visit: Payer: Self-pay

## 2022-01-08 DIAGNOSIS — R1319 Other dysphagia: Secondary | ICD-10-CM | POA: Diagnosis not present

## 2022-01-08 DIAGNOSIS — L598 Other specified disorders of the skin and subcutaneous tissue related to radiation: Secondary | ICD-10-CM | POA: Diagnosis not present

## 2022-01-08 DIAGNOSIS — Z85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, oral cavity, and pharynx: Secondary | ICD-10-CM | POA: Diagnosis not present

## 2022-01-08 DIAGNOSIS — Z510 Encounter for antineoplastic radiation therapy: Secondary | ICD-10-CM | POA: Diagnosis not present

## 2022-01-08 NOTE — Progress Notes (Addendum)
[name], Juarez ([id number]) Visit Report for 01/08/2022 Arrival Information Details Patient Name: Date of Service: [place], [name] 01/08/2022 10:00 A M Medical Record Number: [id number] Patient Account Number: [id number] Date of Birth/Sex: Treating RN: [birth year] (71 y.o. [name], [name] Primary Care Foye Haggart: [name] Other Clinician: Referring Juneau Doughman: Treating [name]/Extender: Tacey Ruiz in Treatment: 12 Visit Information History Since Last Visit All ordered tests and consults were completed: Yes Patient Arrived: Ambulatory Added or deleted any medications: No Arrival Time: 09:35 Any new allergies or adverse reactions: No Accompanied By: None Had a fall or experienced change in No Transfer Assistance: None activities of daily living that may affect Patient Identification Verified: Yes risk of falls: Secondary Verification Process Completed: Yes Signs or symptoms of abuse/neglect since last visito No Patient Requires Transmission-Based Precautions: No Hospitalized since last visit: No Patient Has Alerts: No Implantable device outside of the clinic excluding No cellular tissue based products placed in the center since last visit: Pain Present Now: No Electronic Signature(s) Signed: 01/08/2022 1:38:07 PM By: [name] Batman EMT Entered By: [name] Batman on 01/08/2022 13:38:07 -------------------------------------------------------------------------------- Encounter Discharge Information Details Patient Name: Date of Service: [name] [place], [name] 01/08/2022 10:00 A M Medical Record Number: [id number] Patient Account Number: [id number] Date of Birth/Sex: Treating RN: 1951/11/07 (71 y.o. [name], [name] Primary Care Camira Geidel: [name] Other Clinician: [name] Batman Referring Annaliz Aven: Treating Carrington Mullenax/Extender: Tacey Ruiz in Treatment: 12 Encounter Discharge Information Items Discharge Condition:  Stable Ambulatory Status: Ambulatory Discharge Destination: Home Transportation: Private Auto Accompanied By: None Schedule Follow-up Appointment: Yes Clinical Summary of Care: Patient Declined Electronic Signature(s) Signed: 01/08/2022 1:43:49 PM By: [name] Batman EMT Entered By: [name] Batman on 01/08/2022 13:43:49 -------------------------------------------------------------------------------- Patient/Caregiver Education Details Patient Name: Date of Service: [name] [place], [name] 1/18/2023andnbsp10:00 [place] Record Number: [id number] Patient Account Number: [id number] Date of Birth/Gender: Treating RN: July 02, 1951 (71 y.o. [name] Primary Care Physician: [name] Other Clinician: [name] Batman Referring Physician: Treating Physician/Extender: Tacey Ruiz in Treatment: 12 Education Assessment Education Provided To: Patient Education Topics Provided Electronic Signature(s) Signed: 01/08/2022 1:43:29 PM By: [name] Batman EMT Entered By: [name] Batman on 01/08/2022 13:43:28 -------------------------------------------------------------------------------- [place] Details Patient Name: Date of Service: [name] [place], [name] 01/08/2022 10:00 A M Medical Record Number: [id number] Patient Account Number: [id number] Date of Birth/Sex: Treating RN: 08/24/1951 (72 y.o. [name], [name] Primary Care Ellowyn Rieves: [name] Other Clinician: [name] Batman Referring [name]: Treating [name]/Extender: Tacey Ruiz in Treatment: 12 Vital Signs Time Taken: 09:48 Temperature (F): 98.5 Height (in): 68 Pulse (bpm): 82 Weight (lbs): 127 Respiratory Rate (breaths/min): 18 Body Mass Index (BMI): 19.3 Blood Pressure (mmHg): 116/58 Reference Range: 80 - 120 mg / dl Airway Pulse Oximetry (%): 100 Electronic Signature(s) Signed: 01/08/2022 1:38:41 PM By: [name] Batman EMT Entered By: [name] Batman on  01/08/2022 13:38:40

## 2022-01-08 NOTE — Progress Notes (Signed)
ONEAL, SCHOENBERGER ([id number]) Visit Report for 01/08/2022 Problem List Details Patient Name: Date of Service: [place], [place]. 01/08/2022 10:00 A M Medical Record Number: [id number] Patient Account Number: [id number] Date of Birth/Sex: Treating RN: [birth year] (71 y.o. 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 01/08/2022 Medical Record Number: [id number] Patient Account Number: [id number] Date of Birth/Sex: Treating RN: [birth year] (71 y.o. 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, oral cavity, and pharynx Facility Procedures The patient participates with  Medicare or their insurance follows the Medicare Facility Guidelines: CPT4 Code Description Modifier Quantity [id number] G0277-(Facility Use Only) HBOT full body chamber, 6min , 4 ICD-10 Diagnosis Description L59.8 Other  specified disorders of the skin and subcutaneous tissue related to radiation Z51.0 Encounter for antineoplastic radiation therapy R13.19 Other dysphagia 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, oral cavity, and pharynx Physician Procedures : CPT4 Code Description Modifier [id number] - WC PHYS HYPERBARIC OXYGEN THERAPY ICD-10 Diagnosis Description L59.8 Other specified disorders of the skin and subcutaneous tissue related to radiation Z51.0 Encounter for antineoplastic radiation therapy  R13.19 Other dysphagia 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

## 2022-01-08 NOTE — Progress Notes (Addendum)
[name], [name] ([id number]) Visit Report for 01/08/2022 HBO Details Patient Name: Date of Service: [place], [name]. 01/08/2022 10:00 A M Medical Record Number: [id number] Patient Account Number: [id number] Date of Birth/Sex: Treating RN: 12-30-1950 (71 y.o. 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 HBO Treatment Details Treatment Number: 32 Patient Type: Outpatient Chamber Type: Monoplace Chamber Serial #: [id number] Treatment Protocol: 2.0 ATA with 90 minutes oxygen, and no air breaks Treatment Details Compression Rate Down: 2.0 psi / minute De-Compression Rate Up: 2.0 psi / minute Air breaks and breathing Decompress Decompress Compress Tx Pressure Begins Reached periods Begins Ends (leave unused spaces blank) Chamber Pressure (ATA 1 2 ------2 1 ) Clock Time (24 hr) 10:08 10:16 - - - - - - 11:46 11:54 Treatment Length: 106 (minutes) Treatment Segments: 4 Vital Signs Capillary Blood Glucose Reference Range: 80 - 120 mg / dl HBO Diabetic Blood Glucose Intervention Range: <131 mg/dl or >951 mg/dl Time Vitals Blood Respiratory Capillary Blood Glucose Pulse Action Type: Pulse: Temperature: Taken: Pressure: Rate: Glucose (mg/dl): Meter #: Oximetry (%) Taken: Pre 09:48 116/58 82 18 98.5 100 Post 11:57 110/52 70 18 98.1 Treatment Response Treatment Toleration: Well Treatment Completion Status: Treatment Completed without Adverse Event Treatment Notes Information entered from paper intake sheet. [name] Notes No concerns with treatment given Physician HBO Attestation: I certify that I supervised this  HBO treatment in accordance with Medicare guidelines. A trained emergency response team is readily available per Yes hospital policies and procedures. Continue HBOT as ordered. Yes Electronic Signature(s) Signed: 01/08/2022 4:03:30 PM By: [name] MD Previous Signature: 01/08/2022 1:42:18 PM Version By: [name] EMT Previous Signature: 01/08/2022 3:43:46 PM Version By: [name] MD Previous Signature: 01/08/2022 3:43:46 PM Version By: [name] MD Entered By: [name] on 01/08/2022 15:56:04 -------------------------------------------------------------------------------- HBO Safety Checklist Details Patient Name: Date of Service: [place], [name] 01/08/2022 10:00 A M Medical Record Number: [id number] Patient Account Number: [id number] Date of Birth/Sex: Treating RN: 1950-12-30 (71 y.o. [name], [name] Primary Care [name]: [name] Other Clinician: [name] Referring Brent Taillon: Treating [name]/Extender: [name] in Treatment: 12 HBO Safety Checklist Items Safety Checklist Consent Form Signed Patient voided / foley secured and emptied When did you last eato 0700 Last dose of injectable or oral agent NA Ostomy pouch emptied and vented if applicable NA All implantable devices assessed, documented and approved NA Intravenous access site secured and place NA Valuables secured Linens and cotton and cotton/polyester blend (less than 51% polyester) Personal oil-based products / skin lotions / body lotions removed Wigs or hairpieces removed NA Smoking or tobacco materials removed Books / newspapers / magazines / loose paper removed Cologne, aftershave, perfume and deodorant removed Jewelry removed (may wrap wedding band) NA Make-up removed NA Hair care products removed NA Battery operated devices (external) removed Heating patches and chemical warmers removed Titanium eyewear removed NA Nail polish  cured greater than 10 hours NA Casting material cured greater than 10 hours NA Hearing aids removed Loose dentures or partials removed NA Prosthetics have been removed NA Patient demonstrates correct use of air break device (if applicable) Patient concerns have been addressed Patient grounding bracelet on and cord attached to chamber Specifics for Inpatients (complete  in addition to above) Medication sheet sent with patient NA Intravenous medications needed or due during therapy sent with patient NA Drainage tubes (e.g. nasogastric tube or chest tube secured and vented) NA Endotracheal or Tracheotomy tube secured NA Cuff deflated of air and inflated with saline NA Airway suctioned NA Notes information entered from paper intake sheet. Electronic Signature(s) Signed: 01/08/2022 1:40:01 PM By: [name] EMT Entered By: [name] on 01/08/2022 13:40:01

## 2022-01-09 ENCOUNTER — Encounter (HOSPITAL_BASED_OUTPATIENT_CLINIC_OR_DEPARTMENT_OTHER): Payer: Medicare Other | Admitting: Internal Medicine

## 2022-01-09 DIAGNOSIS — Z510 Encounter for antineoplastic radiation therapy: Secondary | ICD-10-CM | POA: Diagnosis not present

## 2022-01-09 DIAGNOSIS — L598 Other specified disorders of the skin and subcutaneous tissue related to radiation: Secondary | ICD-10-CM | POA: Diagnosis not present

## 2022-01-09 DIAGNOSIS — J387 Other diseases of larynx: Secondary | ICD-10-CM | POA: Diagnosis not present

## 2022-01-09 DIAGNOSIS — Z85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, oral cavity, and pharynx: Secondary | ICD-10-CM | POA: Diagnosis not present

## 2022-01-09 DIAGNOSIS — R1319 Other dysphagia: Secondary | ICD-10-CM | POA: Diagnosis not present

## 2022-01-09 DIAGNOSIS — C099 Malignant neoplasm of tonsil, unspecified: Secondary | ICD-10-CM | POA: Diagnosis not present

## 2022-01-09 DIAGNOSIS — R1310 Dysphagia, unspecified: Secondary | ICD-10-CM | POA: Diagnosis not present

## 2022-01-09 NOTE — Progress Notes (Signed)
[name], [name] ([id number]) Visit Report for 01/09/2022 Problem List Details Patient Name: Date of Service: [place], [place]. 01/09/2022 10:00 A M Medical Record Number: [id number] Patient Account Number: [id number] Date of Birth/Sex: Treating RN: 21-Feb-1951 (71 y.o. 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 01/09/2022 Medical Record Number: [id number] Patient Account Number: [id number] Date of Birth/Sex: Treating RN: March 17, 1951 (71 y.o. 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, oral cavity, and pharynx Facility Procedures The patient participates  with Medicare or their insurance follows the Medicare Facility Guidelines: CPT4 Code Description Modifier Quantity [id number]-(Facility Use Only) HBOT full body chamber, 75min , 4 ICD-10 Diagnosis Description L59.8 Other  specified disorders of the skin and subcutaneous tissue related to radiation Z51.0 Encounter for antineoplastic radiation therapy R13.19 Other dysphagia 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, oral cavity, and pharynx Physician Procedures : CPT4 Code Description Modifier [id number] - WC PHYS HYPERBARIC OXYGEN THERAPY ICD-10 Diagnosis Description L59.8 Other specified disorders of the skin and subcutaneous tissue related to radiation Z51.0 Encounter for antineoplastic radiation therapy  R13.19 Other dysphagia 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

## 2022-01-09 NOTE — Progress Notes (Addendum)
[name], [name] ([id number]) Visit Report for 01/09/2022 HBO Details Patient Name: Date of Service: [place], [name]. 01/09/2022 10:00 A M Medical Record Number: [id number] Patient Account Number: [id number] Date of Birth/Sex: Treating RN: 11/29/1951 (71 y.o. 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 HBO Treatment Details Treatment Number: 33 Patient Type: Outpatient Chamber Type: Monoplace Chamber Serial #: [id number] Treatment Protocol: 2.0 ATA with 90 minutes oxygen, and no air breaks Treatment Details Compression Rate Down: 2.0 psi / minute De-Compression Rate Up: 2.0 psi / minute Air breaks and breathing Decompress Decompress Compress Tx Pressure Begins Reached periods Begins Ends (leave unused spaces blank) Chamber Pressure (ATA 1 2 ------2 1 ) Clock Time (24 hr) 09:43 09:51 - - - - - - 11:21 11:29 Treatment Length: 106 (minutes) Treatment Segments: 4 Vital Signs Capillary Blood Glucose Reference Range: 80 - 120 mg / dl HBO Diabetic Blood Glucose Intervention Range: <131 mg/dl or >782 mg/dl Time Vitals Blood Respiratory Capillary Blood Glucose Pulse Action Type: Pulse: Temperature: Taken: Pressure: Rate: Glucose (mg/dl): Meter #: Oximetry (%) Taken: Pre 09:36 108/50 77 16 98.2 Post 11:32 104/52 77 16 98.2 Treatment Response Treatment Toleration: Well Treatment Completion Status: Treatment Completed without Adverse Event Treatment Notes Information entered from paper in-take sheet. Nariah Morgano Notes No concerns with treatment given Physician HBO Attestation: I certify that I supervised this HBO  treatment in accordance with Medicare guidelines. A trained emergency response team is readily available per Yes hospital policies and procedures. Continue HBOT as ordered. Yes Electronic Signature(s) Signed: 01/09/2022 4:44:32 PM By: [name] MD Previous Signature: 01/09/2022 3:00:36 PM Version By: [name] EMT Entered By: [name] on 01/09/2022 16:43:24 -------------------------------------------------------------------------------- HBO Safety Checklist Details Patient Name: Date of Service: [place], [name] 01/09/2022 10:00 A M Medical Record Number: [id number] Patient Account Number: [id number] Date of Birth/Sex: Treating RN: 07-Dec-1951 (71 y.o. [name], [name] Primary Care Elad Macphail: [name] Other Clinician: [name] Referring Vane Yapp: Treating Ashrith Sagan/Extender: [name] in Treatment: 12 HBO Safety Checklist Items Safety Checklist Consent Form Signed Patient voided / foley secured and emptied When did you last eato 0730 Peg tube Last dose of injectable or oral agent NA Ostomy pouch emptied and vented if applicable NA All implantable devices assessed, documented and approved Intravenous access site secured and place NA Valuables secured Linens and cotton and cotton/polyester blend (less than 51% polyester) Personal oil-based products / skin lotions / body lotions removed Wigs or hairpieces removed NA Smoking or tobacco materials removed Books / newspapers / magazines / loose paper removed Cologne, aftershave, perfume and deodorant removed Jewelry removed (may wrap wedding band) NA Make-up removed NA Hair care products removed NA Battery operated devices (external) removed Heating patches and chemical warmers removed Titanium eyewear removed NA Nail polish cured greater than 10 hours NA Casting material cured greater than 10 hours NA Hearing aids removed NA Loose dentures or partials  removed NA Prosthetics have been removed NA Patient demonstrates correct use of air break device (if applicable) Patient concerns have been addressed Patient grounding bracelet on and cord attached to chamber Specifics for Inpatients (complete in addition to above) Medication sheet sent with patient NA Intravenous medications needed or due during therapy sent with  patient NA Drainage tubes (e.g. nasogastric tube or chest tube secured and vented) NA Endotracheal or Tracheotomy tube secured NA Cuff deflated of air and inflated with saline NA Airway suctioned NA Notes Information entered from paper in-take sheet. Electronic Signature(s) Signed: 01/09/2022 2:59:13 PM By: [name] EMT Entered By: [name] on 01/09/2022 14:59:13

## 2022-01-10 ENCOUNTER — Other Ambulatory Visit: Payer: Self-pay

## 2022-01-10 ENCOUNTER — Encounter (HOSPITAL_BASED_OUTPATIENT_CLINIC_OR_DEPARTMENT_OTHER): Payer: Medicare Other | Admitting: Internal Medicine

## 2022-01-10 DIAGNOSIS — L598 Other specified disorders of the skin and subcutaneous tissue related to radiation: Secondary | ICD-10-CM | POA: Diagnosis not present

## 2022-01-10 DIAGNOSIS — R1319 Other dysphagia: Secondary | ICD-10-CM

## 2022-01-10 DIAGNOSIS — Z85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, oral cavity, and pharynx: Secondary | ICD-10-CM

## 2022-01-10 DIAGNOSIS — Z510 Encounter for antineoplastic radiation therapy: Secondary | ICD-10-CM | POA: Diagnosis not present

## 2022-01-10 NOTE — Progress Notes (Signed)
DIALLO, PAWLUS ([id number]) Visit Report for 01/10/2022 HBO Details Patient Name: Date of Service: Juarez, [name] 01/10/2022 10:00 A M Medical Record Number: [id number] Patient Account Number: [id number] Date of Birth/Sex: Treating RN: 07-Feb-1951 (71 y.o. 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 HBO Treatment Details Treatment Number: 34 Patient Type: Outpatient Chamber Type: Monoplace Chamber Serial #: [id number] Treatment Protocol: 2.0 ATA with 90 minutes oxygen, and no air breaks Treatment Details Compression Rate Down: 2.0 psi / minute De-Compression Rate Up: 2.0 psi / minute Air breaks and breathing Decompress Decompress Compress Tx Pressure Begins Reached periods Begins Ends (leave unused spaces blank) Chamber Pressure (ATA 1 2 ------2 1 ) Clock Time (24 hr) 09:36 09:44 - - - - - - 11:15 11:22 Treatment Length: 106 (minutes) Treatment Segments: 4 Vital Signs Capillary Blood Glucose Reference Range: 80 - 120 mg / dl HBO Diabetic Blood Glucose Intervention Range: <131 mg/dl or >595 mg/dl Time Vitals Blood Respiratory Capillary Blood Glucose Pulse Action Type: Pulse: Temperature: Taken: Pressure: Rate: Glucose (mg/dl): Meter #: Oximetry (%) Taken: Pre 09:31 108/50 76 16 98.1 100 Post 12:26 102/48 71 16 98.1 Treatment Response Treatment Toleration: Well Treatment Completion Status: Treatment Completed without Adverse Event Treatment Notes Paper version used prior to treatment. Physician HBO Attestation: I certify that I supervised this HBO treatment in accordance with Medicare guidelines. A  trained emergency response team is readily available per Yes hospital policies and procedures. Continue HBOT as ordered. Yes Electronic Signature(s) Signed: 01/10/2022 1:33:29 PM By: [name] [name] DO Previous Signature: 01/10/2022 11:48:42 AM Version By: [name] EMT Entered By: [name] on 01/10/2022 13:25:23 -------------------------------------------------------------------------------- HBO Safety Checklist Details Patient Name: Date of Service: [name], [name] [name] 01/10/2022 10:00 A M Medical Record Number: [id number] Patient Account Number: [id number] Date of Birth/Sex: Treating RN: [birth year] (71 y.o. [name] Primary Care [name]: [name] Other Clinician: Referring [name]: Treating [name]/Extender: [name] Weeks in Treatment: 12 HBO Safety Checklist Items Safety Checklist Consent Form Signed Patient voided / foley secured and emptied When did you last eato 0715 Pegtube Last dose of injectable or oral agent n/a Ostomy pouch emptied and vented if applicable NA All implantable devices assessed, documented and approved NA Intravenous access site secured and place NA Valuables secured NA Linens and cotton and cotton/polyester blend (less than 51% polyester) Personal oil-based products / skin lotions / body lotions removed Wigs or hairpieces removed NA Smoking or tobacco materials removed Books / newspapers / magazines / loose paper removed Cologne, aftershave, perfume and deodorant removed Jewelry removed (may wrap wedding band) Make-up removed NA Hair care products removed NA Battery operated devices (external) removed Heating patches and chemical warmers removed Titanium eyewear removed NA Nail polish cured greater than 10 hours NA Casting material cured greater than 10 hours NA Hearing aids removed NA Loose dentures or partials removed NA Prosthetics have been removed NA Patient demonstrates  correct use of air break device (if applicable) Patient concerns have been addressed Patient grounding bracelet on and cord attached to chamber Specifics for Inpatients (complete in addition to above) Medication sheet sent with patient NA Intravenous medications needed or due during therapy sent with patient NA Drainage tubes (e.g. nasogastric tube or  chest tube secured and vented) NA Endotracheal or Tracheotomy tube secured NA Cuff deflated of air and inflated with saline NA Airway suctioned NA Notes Paper version used prior to treatment. Electronic Signature(s) Signed: 01/10/2022 11:51:04 AM By: [name] EMT Entered By: [name] on 01/10/2022 11:47:09

## 2022-01-10 NOTE — Progress Notes (Signed)
[name], [name] ([id number]) Visit Report for 01/10/2022 Arrival Information Details Patient Name: Date of Service: [name], [name] 01/10/2022 10:00 A M Medical Record Number: [id number] Patient Account Number: [id number] Date of Birth/Sex: Treating RN: [birth year] (71 y.o. [name] Primary Care Apryl Brymer: [name] Other Clinician: Referring Mckenzy Salazar: Treating Quita Mcgrory/Extender: [name] in Treatment: 12 Visit Information History Since Last Visit All ordered tests and consults were completed: Yes Patient Arrived: Ambulatory Added or deleted any medications: No Arrival Time: 09:18 Any new allergies or adverse reactions: No Accompanied By: self Had a fall or experienced change in No Transfer Assistance: None activities of daily living that may affect Patient Identification Verified: Yes risk of falls: Secondary Verification Process Completed: Yes Signs or symptoms of abuse/neglect since last visito No Patient Requires Transmission-Based Precautions: No Hospitalized since last visit: No Patient Has Alerts: No Implantable device outside of the clinic excluding No cellular tissue based products placed in the center since last visit: Pain Present Now: No Notes Paper version used prior to treatment. Electronic Signature(s) Signed: 01/10/2022 11:51:04 AM By: [name] Batman EMT Entered By: [name] Batman on 01/10/2022 11:03:03 -------------------------------------------------------------------------------- Encounter Discharge Information Details Patient Name: Date of Service: [name], [name] [name] 01/10/2022 10:00 A M Medical Record Number: [id number] Patient Account Number: [id number] Date of Birth/Sex: Treating RN: Sep 13, 1951 (71 y.o. [name] Primary Care Landry Kamath: [name] Other Clinician: Referring Ki Luckman: Treating Lynden Flemmer/Extender: [name] in Treatment: 12 Encounter Discharge  Information Items Discharge Condition: Stable Ambulatory Status: Ambulatory Discharge Destination: Home Transportation: Private Auto Accompanied By: self Schedule Follow-up Appointment: Yes Clinical Summary of Care: Patient Declined Electronic Signature(s) Signed: 01/10/2022 11:51:04 AM By: [name] Batman EMT Entered By: [name] Batman on 01/10/2022 11:50:06 -------------------------------------------------------------------------------- Patient/Caregiver Education Details Patient Name: Date of Service: [name], [name] 1/20/2023andnbsp10:00 [place] Record Number: [id number] Patient Account Number: [id number] Date of Birth/Gender: Treating RN: November 01, 1951 (71 y.o. [name] Primary Care Physician: [name] Other Clinician: [name] Batman Referring Physician: Treating Physician/Extender: [name] in Treatment: 12 Education Assessment Education Provided To: Patient Education Topics Provided Electronic Signature(s) Signed: 01/10/2022 11:51:04 AM By: [name] Batman EMT Entered By: [name] Batman on 01/10/2022 11:49:47 -------------------------------------------------------------------------------- [place] Details Patient Name: Date of Service: [name], [name] 01/10/2022 10:00 A M Medical Record Number: [id number] Patient Account Number: [id number] Date of Birth/Sex: Treating RN: 03-09-1951 (71 y.o. [name] Primary Care Alicen Donalson: [name] Other Clinician: Referring Shwanda Soltis: Treating Monika Chestang/Extender: [name] Weeks in Treatment: 12 Vital Signs Time Taken: 09:31 Temperature (F): 98.1 Height (in): 68 Pulse (bpm): 76 Weight (lbs): 127 Respiratory Rate (breaths/min): 16 Body Mass Index (BMI): 19.3 Blood Pressure (mmHg): 108/50 Reference Range: 80 - 120 mg / dl Airway Pulse Oximetry (%): 100 Notes Paper version used prior to treatment. Electronic Signature(s) Signed:  01/10/2022 11:51:04 AM By: [name] Batman EMT Entered By: [name] Batman on 01/10/2022 11:03:44

## 2022-01-10 NOTE — Progress Notes (Addendum)
YOUSAF, [name] ([id number]) Visit Report for 01/09/2022 Arrival Information Details Patient Name: Date of Service: [place], [name] 01/09/2022 10:00 A M Medical Record Number: [id number] Patient Account Number: [id number] Date of Birth/Sex: Treating RN: [birth year] (71 y.o. M) Primary Care Oluwatamilore Starnes: [name] Other Clinician: Referring Rhyse Skowron: Treating Marshay Slates/Extender: Tacey Ruiz in Treatment: 12 Visit Information History Since Last Visit All ordered tests and consults were completed: Yes Patient Arrived: Ambulatory Added or deleted any medications: No Arrival Time: 09:23 Any new allergies or adverse reactions: No Accompanied By: None Had a fall or experienced change in No Transfer Assistance: None activities of daily living that may affect Patient Requires Transmission-Based Precautions: No risk of falls: Patient Has Alerts: No Signs or symptoms of abuse/neglect since last visito No Hospitalized since last visit: No Implantable device outside of the clinic excluding No cellular tissue based products placed in the center since last visit: Pain Present Now: No Notes Information entered from paper in-take sheet. Electronic Signature(s) Signed: 01/09/2022 2:46:30 PM By: [name] Batman EMT Entered By: [name] Batman on 01/09/2022 14:46:30 -------------------------------------------------------------------------------- Encounter Discharge Information Details Patient Name: Date of Service: [place], [name] [name] 01/09/2022 10:00 A M Medical Record Number: [id number] Patient Account Number: [id number] Date of Birth/Sex: Treating RN: [birth year] (71 y.o. [name] Primary Care Jazzalyn Loewenstein: [name] Other Clinician: [name] Batman Referring Zitlaly Malson: Treating Sharaine Delange/Extender: Tacey Ruiz in Treatment: 12 Encounter Discharge Information Items Discharge Condition: Stable Ambulatory Status: Ambulatory Discharge  Destination: Home Transportation: Private Auto Accompanied By: None Schedule Follow-up Appointment: Yes Clinical Summary of Care: Electronic Signature(s) Signed: 01/09/2022 3:20:28 PM By: [name] Batman EMT Entered By: [name] Batman on 01/09/2022 15:20:28 -------------------------------------------------------------------------------- Patient/Caregiver Education Details Patient Name: Date of Service: [name] [place]ango, [name] 1/19/2023andnbsp10:00 [place] Record Number: [id number] Patient Account Number: [id number] Date of Birth/Gender: Treating RN: [birth year] (71 y.o. [name] Primary Care Physician: [name] Other Clinician: [name] Batman Referring Physician: Treating Physician/Extender: Tacey Ruiz in Treatment: 12 Education Assessment Education Provided To: Patient Education Topics Provided Electronic Signature(s) Signed: 01/09/2022 3:20:06 PM By: [name] Batman EMT Entered By: [name] Batman on 01/09/2022 15:20:06 -------------------------------------------------------------------------------- Vitals Details Patient Name: Date of Service: [name] Lango, [name] 01/09/2022 10:00 A M Medical Record Number: [id number] Patient Account Number: [id number] Date of Birth/Sex: Treating RN: 1951/08/17 (71 y.o. [name] Primary Care Deneen Slager: [name] Other Clinician: [name] Batman Referring Roshell Brigham: Treating Dustina Scoggin/Extender: Tacey Ruiz in Treatment: 12 Vital Signs Time Taken: 09:36 Temperature (F): 98.2 Height (in): 68 Pulse (bpm): 77 Weight (lbs): 127 Respiratory Rate (breaths/min): 16 Body Mass Index (BMI): 19.3 Blood Pressure (mmHg): 108/50 Reference Range: 80 - 120 mg / dl Notes Information entered from paper in-take sheet. Electronic Signature(s) Signed: 01/09/2022 2:57:44 PM By: [name] Batman EMT Entered By: [name] Batman on 01/09/2022 14:57:43

## 2022-01-10 NOTE — Progress Notes (Signed)
DESHAUN, WEISINGER ([id number]) Visit Report for 01/10/2022 Problem List Details Patient Name: Date of Service: [place], [name] 01/10/2022 10:00 A M Medical Record Number: [id number] Patient Account Number: [id number] Date of Birth/Sex: Treating RN: 03-26-1951 (71 y.o. 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 01/10/2022 Medical Record Number: [id number] Patient Account Number: [id number] Date of Birth/Sex: Treating RN: 06/17/1951 (72 y.o. 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, oral cavity, and pharynx Facility Procedures The patient participates with Medicare or their  insurance follows the Medicare Facility Guidelines: CPT4 Code Description Modifier Quantity [id number] G0277-(Facility Use Only) HBOT full body chamber, 56min , 4 ICD-10 Diagnosis Description L59.8 Other  specified disorders of the skin and subcutaneous tissue related to radiation Z51.0 Encounter for antineoplastic radiation therapy R13.19 Other dysphagia 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, oral cavity, and pharynx Physician Procedures : CPT4 Code Description Modifier [id number] 51025 - WC PHYS HYPERBARIC OXYGEN THERAPY ICD-10 Diagnosis Description L59.8 Other specified disorders of the skin and subcutaneous tissue related to radiation Z51.0 Encounter for antineoplastic radiation therapy  R13.19 Other dysphagia 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

## 2022-01-13 ENCOUNTER — Other Ambulatory Visit: Payer: Self-pay

## 2022-01-13 ENCOUNTER — Encounter (HOSPITAL_BASED_OUTPATIENT_CLINIC_OR_DEPARTMENT_OTHER): Payer: Medicare Other | Admitting: Internal Medicine

## 2022-01-13 DIAGNOSIS — R1319 Other dysphagia: Secondary | ICD-10-CM | POA: Diagnosis not present

## 2022-01-13 DIAGNOSIS — L598 Other specified disorders of the skin and subcutaneous tissue related to radiation: Secondary | ICD-10-CM

## 2022-01-13 DIAGNOSIS — Z85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, oral cavity, and pharynx: Secondary | ICD-10-CM

## 2022-01-13 DIAGNOSIS — Z510 Encounter for antineoplastic radiation therapy: Secondary | ICD-10-CM | POA: Diagnosis not present

## 2022-01-13 NOTE — Progress Notes (Signed)
ALEM, FAHL ([id number]) Visit Report for 01/13/2022 Problem List Details Patient Name: Date of Service: [place], [name] 01/13/2022 10:00 A M Medical Record Number: [id number] Patient Account Number: [id number] Date of Birth/Sex: Treating RN: 06-20-1951 (71 y.o. 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 01/13/2022 Medical Record Number: [id number] Patient Account Number: [id number] Date of Birth/Sex: Treating RN: 17-Oct-1951 (71 y.o. 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, oral cavity, and pharynx Facility Procedures The patient  participates with Medicare or their insurance follows the Medicare Facility Guidelines: CPT4 Code Description Modifier Quantity [id number] G0277-(Facility Use Only) HBOT full body chamber, 10min , 4 ICD-10 Diagnosis Description L59.8 Other  specified disorders of the skin and subcutaneous tissue related to radiation Z51.0 Encounter for antineoplastic radiation therapy R13.19 Other dysphagia 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, oral cavity, and pharynx Physician Procedures : CPT4 Code Description Modifier [id number] - WC PHYS HYPERBARIC OXYGEN THERAPY ICD-10 Diagnosis Description L59.8 Other specified disorders of the skin and subcutaneous tissue related to radiation Z51.0 Encounter for antineoplastic radiation therapy  R13.19 Other dysphagia 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

## 2022-01-13 NOTE — Progress Notes (Addendum)
THAISON, KALUZA ([id number]) Visit Report for 01/13/2022 HBO Details Patient Name: Date of Service: [place], [name]. 01/13/2022 10:00 A M Medical Record Number: [id number] Patient Account Number: [id number] Date of Birth/Sex: Treating RN: 1951/11/08 (71 y.o. 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 HBO Treatment Details Treatment Number: 35 Patient Type: Outpatient Chamber Type: Monoplace Chamber Serial #: [id number] Treatment Protocol: 2.0 ATA with 90 minutes oxygen, and no air breaks Treatment Details Compression Rate Down: 2.0 psi / minute De-Compression Rate Up: 2.0 psi / minute Air breaks and breathing Decompress Decompress Compress Tx Pressure Begins Reached periods Begins Ends (leave unused spaces blank) Chamber Pressure (ATA 1 2 ------2 1 ) Clock Time (24 hr) 10:01 10:09 - - - - - - 11:40 11:48 Treatment Length: 107 (minutes) Treatment Segments: 4 Vital Signs Capillary Blood Glucose Reference Range: 80 - 120 mg / dl HBO Diabetic Blood Glucose Intervention Range: <131 mg/dl or >086 mg/dl Time Vitals Blood Respiratory Capillary Blood Glucose Pulse Action Type: Pulse: Temperature: Taken: Pressure: Rate: Glucose (mg/dl): Meter #: Oximetry (%) Taken: Pre 09:57 135/65 73 16 98.2 100 Post 11:15 108/49 73 16 98.2 Treatment Response Treatment Toleration: Well Treatment Completion Status: Treatment Completed without Adverse Event Treatment Notes Information entered from paper check-in sheet. Physician HBO Attestation: I certify that I supervised this HBO treatment in accordance with  Medicare guidelines. A trained emergency response team is readily available per Yes hospital policies and procedures. Continue HBOT as ordered. Yes Electronic Signature(s) Signed: 01/13/2022 2:46:44 PM By: Geralyn [name] DO Previous Signature: 01/13/2022 12:59:10 PM Version By: [name] EMT Entered By: Geralyn [name] on 01/13/2022 14:45:23 -------------------------------------------------------------------------------- HBO Safety Checklist Details Patient Name: Date of Service: [place], [name] 01/13/2022 10:00 A M Medical Record Number: [id number] Patient Account Number: [id number] Date of Birth/Sex: Treating RN: 1951/08/22 (71 y.o. Lytle Michaels Primary Care Ionna Avis: [name] Other Clinician: [name] Referring Freedom Lopezperez: Treating Aishi Courts/Extender: Cristopher Peru Weeks in Treatment: 12 HBO Safety Checklist Items Safety Checklist Consent Form Signed Patient voided / foley secured and emptied When did you last eato 0700 Pag tube Last dose of injectable or oral agent NA Ostomy pouch emptied and vented if applicable NA All implantable devices assessed, documented and approved NA Intravenous access site secured and place NA Valuables secured NA Linens and cotton and cotton/polyester blend (less than 51% polyester) Personal oil-based products / skin lotions / body lotions removed Wigs or hairpieces removed NA Smoking or tobacco materials removed Books / newspapers / magazines / loose paper removed Cologne, aftershave, perfume and deodorant removed Jewelry removed (may wrap wedding band) Make-up removed NA Hair care products removed NA Battery operated devices (external) removed Heating patches and chemical warmers removed Titanium eyewear removed NA Nail polish cured greater than 10 hours NA Casting material cured greater than 10 hours NA Hearing aids removed Loose dentures or partials removed NA Prosthetics have been  removed NA Patient demonstrates correct use of air break device (if applicable) Patient concerns have been addressed Patient saw Dr. Mikey Bussing Patient grounding bracelet on and cord attached to chamber Specifics for Inpatients (complete in addition to above) Medication sheet sent with patient NA Intravenous medications needed or due during therapy sent with patient NA  Drainage tubes (e.g. nasogastric tube or chest tube secured and vented) NA Endotracheal or Tracheotomy tube secured NA Cuff deflated of air and inflated with saline NA Airway suctioned NA Notes Information entered from paper check-in sheet. Electronic Signature(s) Signed: 01/13/2022 12:57:28 PM By: [name] EMT Entered By: [name] on 01/13/2022 12:57:27

## 2022-01-14 ENCOUNTER — Encounter (HOSPITAL_BASED_OUTPATIENT_CLINIC_OR_DEPARTMENT_OTHER): Payer: Medicare Other | Admitting: Internal Medicine

## 2022-01-14 DIAGNOSIS — R1319 Other dysphagia: Secondary | ICD-10-CM | POA: Diagnosis not present

## 2022-01-14 DIAGNOSIS — Z510 Encounter for antineoplastic radiation therapy: Secondary | ICD-10-CM | POA: Diagnosis not present

## 2022-01-14 DIAGNOSIS — L598 Other specified disorders of the skin and subcutaneous tissue related to radiation: Secondary | ICD-10-CM | POA: Diagnosis not present

## 2022-01-14 DIAGNOSIS — Z85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, oral cavity, and pharynx: Secondary | ICD-10-CM | POA: Diagnosis not present

## 2022-01-14 NOTE — Progress Notes (Addendum)
[name], [name] ([id number]) Visit Report for 01/14/2022 HBO Details Patient Name: Date of Service: [place], [name]. 01/14/2022 10:00 A M Medical Record Number: [id number] Patient Account Number: [id number] Date of Birth/Sex: Treating RN: 11-12-1951 (71 y.o. 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 HBO Treatment Details Treatment Number: 36 Patient Type: Outpatient Chamber Type: Monoplace Chamber Serial #: [id number] Treatment Protocol: 2.0 ATA with 90 minutes oxygen, and no air breaks Treatment Details Compression Rate Down: 2.0 psi / minute De-Compression Rate Up: 2.0 psi / minute Air breaks and breathing Decompress Decompress Compress Tx Pressure Begins Reached periods Begins Ends (leave unused spaces blank) Chamber Pressure (ATA 1 2 ------2 1 ) Clock Time (24 hr) 09:47 09:55 - - - - - - 11:25 11:34 Treatment Length: 107 (minutes) Treatment Segments: 4 Vital Signs Capillary Blood Glucose Reference Range: 80 - 120 mg / dl HBO Diabetic Blood Glucose Intervention Range: <131 mg/dl or >062 mg/dl Time Vitals Blood Respiratory Capillary Blood Glucose Pulse Action Type: Pulse: Temperature: Taken: Pressure: Rate: Glucose (mg/dl): Meter #: Oximetry (%) Taken: Pre 09:41 125/62 75 16 98.2 100 Post 11:36 108/56 67 16 98.2 Treatment Response Treatment Toleration: Well Treatment Completion Status: Treatment Completed without Adverse Event Nakeesha Bowler Notes No concerns with treatment given Physician HBO Attestation: I certify that I supervised this HBO treatment in accordance with Medicare guidelines. A trained  emergency response team is readily available per Yes hospital policies and procedures. Continue HBOT as ordered. Yes Electronic Signature(s) Signed: 01/14/2022 4:35:26 PM By: [name] MD Previous Signature: 01/14/2022 1:19:23 PM Version By: [name] EMT Entered By: [name] on 01/14/2022 16:34:33 -------------------------------------------------------------------------------- HBO Safety Checklist Details Patient Name: Date of Service: [place], [name] 01/14/2022 10:00 A M Medical Record Number: [id number] Patient Account Number: [id number] Date of Birth/Sex: Treating RN: 1951-03-15 (71 y.o. [name] Primary Care Carmela Piechowski: [name] Other Clinician: [name] Referring Milicent Acheampong: Treating Kessa Fairbairn/Extender: [name] in Treatment: 13 HBO Safety Checklist Items Safety Checklist Consent Form Signed Patient voided / foley secured and emptied When did you last eato 0730 Peg tube Last dose of injectable or oral agent NA Ostomy pouch emptied and vented if applicable NA All implantable devices assessed, documented and approved NA Intravenous access site secured and place NA Valuables secured Linens and cotton and cotton/polyester blend (less than 51% polyester) Personal oil-based products / skin lotions / body lotions removed Wigs or hairpieces removed NA Smoking or tobacco materials removed Books / newspapers / magazines / loose paper removed Cologne, aftershave, perfume and deodorant removed Jewelry removed (may wrap wedding band) NA Make-up removed NA Hair care products removed NA Battery operated devices (external) removed Heating patches and chemical warmers removed Titanium eyewear removed NA Nail polish cured greater than 10 hours NA Casting material cured greater than 10 hours NA Hearing aids removed NA Loose dentures or partials removed NA Prosthetics have been removed NA Patient demonstrates  correct use of air break device (if applicable) Patient concerns have been addressed Patient grounding bracelet on and cord attached to chamber Specifics for Inpatients (complete in addition to above) Medication sheet sent with patient NA Intravenous medications needed or due during therapy sent with patient NA Drainage tubes (e.g. nasogastric  tube or chest tube secured and vented) NA Endotracheal or Tracheotomy tube secured NA Cuff deflated of air and inflated with saline NA Airway suctioned NA Electronic Signature(s) Signed: 01/14/2022 1:18:26 PM By: [name] EMT Entered By: [name] on 01/14/2022 13:18:26

## 2022-01-14 NOTE — Progress Notes (Signed)
LAZLO, TUNNEY ([id number]) Visit Report for 01/14/2022 Problem List Details Patient Name: Date of Service: [name], [name] 01/14/2022 10:00 A M Medical Record Number: [id number] Patient Account Number: [id number] Date of Birth/Sex: Treating RN: [birth year] (71 y.o. 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 01/14/2022 Medical Record Number: [id number] Patient Account Number: [id number] Date of Birth/Sex: Treating RN: May 23, 1951 (71 y.o. 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, oral cavity, and pharynx Facility Procedures The patient  participates with Medicare or their insurance follows the Medicare Facility Guidelines: CPT4 Code Description Modifier Quantity [id number] G0277-(Facility Use Only) HBOT full body chamber, 46min , 4 ICD-10 Diagnosis Description L59.8 Other  specified disorders of the skin and subcutaneous tissue related to radiation Z51.0 Encounter for antineoplastic radiation therapy R13.19 Other dysphagia 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, oral cavity, and pharynx Physician Procedures : CPT4 Code Description Modifier [id number] 65790 - WC PHYS HYPERBARIC OXYGEN THERAPY ICD-10 Diagnosis Description L59.8 Other specified disorders of the skin and subcutaneous tissue related to radiation Z51.0 Encounter for antineoplastic radiation therapy  R13.19 Other dysphagia 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

## 2022-01-14 NOTE — Progress Notes (Addendum)
CHESLEY, VEASEY ([id number]) Visit Report for 01/14/2022 Arrival Information Details Patient Name: Date of Service: [place], [name] 01/14/2022 10:00 A M Medical Record Number: [id number] Patient Account Number: [id number] Date of Birth/Sex: Treating RN: 30-May-1951 (71 y.o. [name] Primary Care Dora Clauss: [name] Other Clinician: [name] Batman Referring Jessice Madill: Treating Hunter Pinkard/Extender: Tacey Ruiz in Treatment: 13 Visit Information History Since Last Visit Pain Present Now: No Patient Arrived: Ambulatory Arrival Time: 09:27 Accompanied By: None Transfer Assistance: None Patient Requires Transmission-Based Precautions: No Patient Has Alerts: No Notes Information entered from paper intake sheet. Electronic Signature(s) Signed: 01/14/2022 1:16:36 PM By: [name] Batman EMT Entered By: [name] Batman on 01/14/2022 13:16:36 -------------------------------------------------------------------------------- Encounter Discharge Information Details Patient Name: Date of Service: [place], [name] [name] 01/14/2022 10:00 A M Medical Record Number: [id number] Patient Account Number: [id number] Date of Birth/Sex: Treating RN: Oct 18, 1951 (71 y.o. [name] Primary Care Kamaal Cast: [name] Other Clinician: [name] Batman Referring Evelen Vazguez: Treating Ashrith Sagan/Extender: Tacey Ruiz in Treatment: 13 Encounter Discharge Information Items Discharge Condition: Stable Ambulatory Status: Ambulatory Discharge Destination: Home Transportation: Private Auto Accompanied By: None Schedule Follow-up Appointment: Yes Clinical Summary of Care: Electronic Signature(s) Signed: 01/14/2022 1:20:25 PM By: [name] Batman EMT Entered By: [name] Batman on 01/14/2022 13:20:25 -------------------------------------------------------------------------------- Patient/Caregiver Education Details Patient Name: Date of Service: [name] 1/24/2023andnbsp10:00 [place] Record Number: [id number] Patient Account Number: [id number] Date of Birth/Gender: Treating RN: Dec 17, 1951 (71 y.o. [name] Primary Care Physician: [name] Other Clinician: [name] Batman Referring Physician: Treating Physician/Extender: Tacey Ruiz in Treatment: 13 Education Assessment Education Provided To: Patient Education Topics Provided Electronic Signature(s) Signed: 01/14/2022 2:12:37 PM By: [name] Batman EMT Entered By: [name] Batman on 01/14/2022 13:20:07 -------------------------------------------------------------------------------- Vitals Details Patient Name: Date of Service: [name], [name] 01/14/2022 10:00 A M Medical Record Number: [id number] Patient Account Number: [id number] Date of Birth/Sex: Treating RN: 1951/10/14 (71 y.o. [name] Primary Care Tryce Surratt: [name] Other Clinician: [name] Batman Referring Tawania Daponte: Treating Jaylenn Baiza/Extender: Tacey Ruiz in Treatment: 13 Vital Signs Time Taken: 09:41 Temperature (F): 98.2 Height (in): 68 Pulse (bpm): 75 Weight (lbs): 127 Respiratory Rate (breaths/min): 16 Body Mass Index (BMI): 19.3 Blood Pressure (mmHg): 125/62 Reference Range: 80 - 120 mg / dl Airway Pulse Oximetry (%): 100 Electronic Signature(s) Signed: 01/14/2022 1:17:21 PM By: [name] Batman EMT Entered By: [name] Batman on 01/14/2022 13:17:20

## 2022-01-15 ENCOUNTER — Encounter (HOSPITAL_BASED_OUTPATIENT_CLINIC_OR_DEPARTMENT_OTHER): Payer: Medicare Other | Admitting: Physician Assistant

## 2022-01-15 ENCOUNTER — Other Ambulatory Visit: Payer: Self-pay

## 2022-01-15 DIAGNOSIS — L598 Other specified disorders of the skin and subcutaneous tissue related to radiation: Secondary | ICD-10-CM | POA: Diagnosis not present

## 2022-01-15 DIAGNOSIS — R1319 Other dysphagia: Secondary | ICD-10-CM | POA: Diagnosis not present

## 2022-01-15 DIAGNOSIS — Z510 Encounter for antineoplastic radiation therapy: Secondary | ICD-10-CM | POA: Diagnosis not present

## 2022-01-15 DIAGNOSIS — Z85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, oral cavity, and pharynx: Secondary | ICD-10-CM | POA: Diagnosis not present

## 2022-01-15 NOTE — Progress Notes (Signed)
[name], [name] ([id number]) Visit Report for 01/15/2022 Problem List Details Patient Name: Date of Service: [place], [name] 01/15/2022 10:00 A M Medical Record Number: [id number] Patient Account Number: [id number] Date of Birth/Sex: Treating RN: 11/01/1951 (71 y.o. 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 01/15/2022 Medical Record Number: [id number] Patient Account Number: [id number] Date of Birth/Sex: Treating RN: July 16, 1951 (71 y.o. 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, oral cavity, and pharynx Facility Procedures The patient  participates with Medicare or their insurance follows the Medicare Facility Guidelines: CPT4 Code Description Modifier Quantity [id number] G0277-(Facility Use Only) HBOT full body chamber, 83min , 4 ICD-10 Diagnosis Description L59.8 Other  specified disorders of the skin and subcutaneous tissue related to radiation Z51.0 Encounter for antineoplastic radiation therapy R13.19 Other dysphagia 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, oral cavity, and pharynx Physician Procedures : CPT4 Code Description Modifier [id number] 30092 - WC PHYS HYPERBARIC OXYGEN THERAPY ICD-10 Diagnosis Description L59.8 Other specified disorders of the skin and subcutaneous tissue related to radiation Z51.0 Encounter for antineoplastic radiation therapy  R13.19 Other dysphagia 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

## 2022-01-15 NOTE — Progress Notes (Addendum)
[name], [name] ([id number]) Visit Report for 01/15/2022 HBO Details Patient Name: Date of Service: [name], [name]. 01/15/2022 10:00 A M Medical Record Number: [id number] Patient Account Number: [id number] Date of Birth/Sex: Treating RN: [birth year] (71 y.o. 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 HBO Treatment Details Treatment Number: 37 Patient Type: Outpatient Chamber Type: Monoplace Chamber Serial #: [id number] Treatment Protocol: 2.0 ATA with 90 minutes oxygen, and no air breaks Treatment Details Compression Rate Down: 2.0 psi / minute De-Compression Rate Up: 2.0 psi / minute Air breaks and breathing Decompress Decompress Compress Tx Pressure Begins Reached periods Begins Ends (leave unused spaces blank) Chamber Pressure (ATA 1 2 ------2 1 ) Clock Time (24 hr) 09:03 09:11 - - - - - - 10:41 10:49 Treatment Length: 106 (minutes) Treatment Segments: 4 Vital Signs Capillary Blood Glucose Reference Range: 80 - 120 mg / dl HBO Diabetic Blood Glucose Intervention Range: <131 mg/dl or >784 mg/dl Time Vitals Blood Respiratory Capillary Blood Glucose Pulse Action Type: Pulse: Temperature: Taken: Pressure: Rate: Glucose (mg/dl): Meter #: Oximetry (%) Taken: Pre 08:57 119/59 72 18 98.1 Post 10:52 114/65 72 18 98.2 Treatment Response Treatment Toleration: Well Treatment Completion Status: Treatment Completed without Adverse Event Treatment Notes Information entered from paper intake sheet. Physician HBO Attestation: I certify that I supervised this HBO treatment in accordance with Medicare guidelines. A  trained emergency response team is readily available per Yes hospital policies and procedures. Continue HBOT as ordered. Yes Electronic Signature(s) Signed: 01/15/2022 3:45:20 PM By: Lenda Kelp PA-C Previous Signature: 01/15/2022 11:56:20 AM Version By: [name] EMT Entered By: Lenda Kelp on 01/15/2022 15:45:19 -------------------------------------------------------------------------------- HBO Safety Checklist Details Patient Name: Date of Service: [name], [name] 01/15/2022 10:00 A M Medical Record Number: [id number] Patient Account Number: [id number] Date of Birth/Sex: Treating RN: [birth year] (71 y.o. [name] Primary Care Laquasha Groome: [name] Other Clinician: [name] Referring Johnpatrick Jenny: Treating Nakenya Theall/Extender: Roberts Gaudy, Tiffany Weeks in Treatment: 13 HBO Safety Checklist Items Safety Checklist Consent Form Signed Patient voided / foley secured and emptied When did you last eato 0730 peg tube Last dose of injectable or oral agent NA Ostomy pouch emptied and vented if applicable NA All implantable devices assessed, documented and approved NA Intravenous access site secured and place NA Valuables secured Linens and cotton and cotton/polyester blend (less than 51% polyester) Personal oil-based products / skin lotions / body lotions removed Wigs or hairpieces removed NA Smoking or tobacco materials removed Books / newspapers / magazines / loose paper removed Cologne, aftershave, perfume and deodorant removed Jewelry removed (may wrap wedding band) NA Make-up removed NA Hair care products removed NA Battery operated devices (external) removed Heating patches and chemical warmers removed Titanium eyewear removed NA Nail polish cured greater than 10 hours NA Casting material cured greater than 10 hours NA Hearing aids removed Loose dentures or partials removed NA Prosthetics have been removed NA Patient  demonstrates correct use of air break device (if applicable) Patient concerns have been addressed Patient grounding bracelet on and cord attached to chamber Specifics for Inpatients (complete in addition to above) Medication sheet sent with patient NA Intravenous medications needed or due during therapy sent with patient NA Drainage  tubes (e.g. nasogastric tube or chest tube secured and vented) NA Endotracheal or Tracheotomy tube secured NA Cuff deflated of air and inflated with saline NA Airway suctioned NA Notes Information entered from paper intake sheet. Electronic Signature(s) Signed: 01/15/2022 11:55:04 AM By: [name] EMT Entered By: [name] on 01/15/2022 11:55:04

## 2022-01-15 NOTE — Progress Notes (Addendum)
[name], ISENHOWER ([id number]) Visit Report for 01/15/2022 Arrival Information Details Patient Name: Date of Service: [place], [place]. 01/15/2022 10:00 A M Medical Record Number: [id number] Patient Account Number: [id number] Date of Birth/Sex: Treating RN: 1951-04-23 (72 y.o. [name] Primary Care Jerrilyn Messinger: [name] Other Clinician: [name] Batman Referring Lacy Sofia: Treating Kingstin Heims/Extender: Alinda Dooms, Tiffany Weeks in Treatment: 13 Visit Information History Since Last Visit All ordered tests and consults were completed: Yes Patient Arrived: Ambulatory Added or deleted any medications: No Arrival Time: 08:49 Any new allergies or adverse reactions: No Accompanied By: None Had a fall or experienced change in No Transfer Assistance: None activities of daily living that may affect Patient Identification Verified: Yes risk of falls: Secondary Verification Process Completed: Yes Signs or symptoms of abuse/neglect since last visito No Patient Requires Transmission-Based Precautions: No Hospitalized since last visit: No Patient Has Alerts: No Implantable device outside of the clinic excluding No cellular tissue based products placed in the center since last visit: Pain Present Now: Yes Notes Information entered from paper intake sheet. Patient stated that he was still having abdomen pain 3/10 Electronic Signature(s) Signed: 01/15/2022 11:52:59 AM By: [name] Batman EMT Entered By: [name] Batman on 01/15/2022 11:52:59 -------------------------------------------------------------------------------- Encounter Discharge Information Details Patient Name: Date of Service: [name], [name] [name] 01/15/2022 10:00 A M Medical Record Number: [id number] Patient Account Number: [id number] Date of Birth/Sex: Treating RN: 1951/10/26 (71 y.o. [name] Primary Care Shya Kovatch: [name] Other Clinician: [name] Batman Referring Jimesha Rising: Treating  Odis Turck/Extender: Alinda Dooms, Tiffany Weeks in Treatment: 13 Encounter Discharge Information Items Discharge Condition: Stable Ambulatory Status: Ambulatory Discharge Destination: Home Transportation: Private Auto Accompanied By: None Schedule Follow-up Appointment: Yes Clinical Summary of Care: Electronic Signature(s) Signed: 01/15/2022 11:57:54 AM By: [name] Batman EMT Entered By: [name] Batman on 01/15/2022 11:57:53 -------------------------------------------------------------------------------- Patient/Caregiver Education Details Patient Name: Date of Service: [name] [place], [name] 1/25/2023andnbsp10:00 [place] Record Number: [id number] Patient Account Number: [id number] Date of Birth/Gender: Treating RN: 1951-06-09 (71 y.o. [name] Primary Care Physician: [name] Other Clinician: [name] Batman Referring Physician: Treating Physician/Extender: Idalia Needle in Treatment: 13 Education Assessment Education Provided To: Patient Education Topics Provided Electronic Signature(s) Signed: 01/15/2022 1:58:31 PM By: [name] Batman EMT Entered By: [name] Batman on 01/15/2022 11:57:27 -------------------------------------------------------------------------------- [place] Details Patient Name: Date of Service: [name] [place], [name] 01/15/2022 10:00 A M Medical Record Number: [id number] Patient Account Number: [id number] Date of Birth/Sex: Treating RN: [birth year] (71 y.o. [name] Primary Care Stark Aguinaga: [name] Other Clinician: [name] Batman Referring Estill Llerena: Treating Amilah Greenspan/Extender: Alinda Dooms, Tiffany Weeks in Treatment: 13 Vital Signs Time Taken: 08:57 Temperature (F): 98.1 Height (in): 68 Pulse (bpm): 72 Weight (lbs): 127 Respiratory Rate (breaths/min): 18 Body Mass Index (BMI): 19.3 Blood Pressure (mmHg): 119/59 Reference Range: 80 - 120 mg / dl Notes Information entered  from paper intake sheet. Electronic Signature(s) Signed: 01/15/2022 11:53:47 AM By: [name] Batman EMT Entered By: [name] Batman on 01/15/2022 11:53:46

## 2022-01-16 ENCOUNTER — Encounter (HOSPITAL_BASED_OUTPATIENT_CLINIC_OR_DEPARTMENT_OTHER): Payer: Medicare Other | Admitting: Internal Medicine

## 2022-01-16 DIAGNOSIS — L598 Other specified disorders of the skin and subcutaneous tissue related to radiation: Secondary | ICD-10-CM | POA: Diagnosis not present

## 2022-01-16 DIAGNOSIS — Z85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, oral cavity, and pharynx: Secondary | ICD-10-CM | POA: Diagnosis not present

## 2022-01-16 DIAGNOSIS — R1319 Other dysphagia: Secondary | ICD-10-CM | POA: Diagnosis not present

## 2022-01-16 DIAGNOSIS — Z510 Encounter for antineoplastic radiation therapy: Secondary | ICD-10-CM | POA: Diagnosis not present

## 2022-01-16 NOTE — Progress Notes (Signed)
[name], [name] ([id number]) Visit Report for 01/16/2022 Problem List Details Patient Name: Date of Service: [place], [name] 01/16/2022 10:00 A M Medical Record Number: [id number] Patient Account Number: [id number] Date of Birth/Sex: Treating RN: 05/08/1951 (71 y.o. 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 01/16/2022 Medical Record Number: [id number] Patient Account Number: [id number] Date of Birth/Sex: Treating RN: [birth year] (71 y.o. 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, oral cavity, and pharynx Facility Procedures The patient participates  with Medicare or their insurance follows the Medicare Facility Guidelines: CPT4 Code Description Modifier Quantity [id number] G0277-(Facility Use Only) HBOT full body chamber, 69min , 4 ICD-10 Diagnosis Description L59.8 Other  specified disorders of the skin and subcutaneous tissue related to radiation Z51.0 Encounter for antineoplastic radiation therapy R13.19 Other dysphagia 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, oral cavity, and pharynx Physician Procedures : CPT4 Code Description Modifier [id number] 32951 - WC PHYS HYPERBARIC OXYGEN THERAPY ICD-10 Diagnosis Description L59.8 Other specified disorders of the skin and subcutaneous tissue related to radiation Z51.0 Encounter for antineoplastic radiation therapy  R13.19 Other dysphagia 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

## 2022-01-16 NOTE — Progress Notes (Addendum)
[name], [name] ([id number]) Visit Report for 01/16/2022 HBO Details Patient Name: Date of Service: [name], [name]. 01/16/2022 10:00 A M Medical Record Number: [id number] Patient Account Number: [id number] Date of Birth/Sex: Treating RN: Dec 22, 1951 (71 y.o. 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 HBO Treatment Details Treatment Number: 38 Patient Type: Outpatient Chamber Type: Monoplace Chamber Serial #: [id number] Treatment Protocol: 2.0 ATA with 90 minutes oxygen, and no air breaks Treatment Details Compression Rate Down: 2.0 psi / minute De-Compression Rate Up: 2.0 psi / minute Air breaks and breathing Decompress Decompress Compress Tx Pressure Begins Reached periods Begins Ends (leave unused spaces blank) Chamber Pressure (ATA 1 2 ------2 1 ) Clock Time (24 hr) 08:57 09:05 - - - - - - 10:35 10:43 Treatment Length: 106 (minutes) Treatment Segments: 4 Vital Signs Capillary Blood Glucose Reference Range: 80 - 120 mg / dl HBO Diabetic Blood Glucose Intervention Range: <131 mg/dl or >086 mg/dl Time Vitals Blood Respiratory Capillary Blood Glucose Pulse Action Type: Pulse: Temperature: Taken: Pressure: Rate: Glucose (mg/dl): Meter #: Oximetry (%) Taken: Pre 08:49 127/60 85 16 98.1 Post 10:46 108/50 71 16 98.2 Treatment Response Treatment Toleration: Well Treatment Completion Status: Treatment Completed without Adverse Event Treatment Notes Information entered from paper intake sheet. Ahamed Hofland Notes No concerns with treatment given Physician HBO Attestation: I certify that I supervised this HBO  treatment in accordance with Medicare guidelines. A trained emergency response team is readily available per Yes hospital policies and procedures. Continue HBOT as ordered. Yes Electronic Signature(Juarez) Signed: 01/16/2022 6:04:31 PM By: [name] MD Previous Signature: 01/16/2022 10:55:59 AM Version By: [name] EMT Entered By: [name] on 01/16/2022 14:38:35 -------------------------------------------------------------------------------- HBO Safety Checklist Details Patient Name: Date of Service: [name], [name]. 01/16/2022 10:00 A M Medical Record Number: [id number] Patient Account Number: [id number] Date of Birth/Sex: Treating RN: July 15, 1951 (71 y.o. [name], [name] Primary Care [name]: [name] Other Clinician: [name] Referring Kreg Earhart: Treating Jondavid Schreier/Extender: [name] in Treatment: 13 HBO Safety Checklist Items Safety Checklist Consent Form Signed Patient voided / foley secured and emptied When did you last eato 0730 peg tube Last dose of injectable or oral agent NA Ostomy pouch emptied and vented if applicable NA All implantable devices assessed, documented and approved NA Intravenous access site secured and place NA Valuables secured Linens and cotton and cotton/polyester blend (less than 51% polyester) Personal oil-based products / skin lotions / body lotions removed Wigs or hairpieces removed NA Smoking or tobacco materials removed Books / newspapers / magazines / loose paper removed Cologne, aftershave, perfume and deodorant removed Jewelry removed (may wrap wedding band) NA Make-up removed NA Hair care products removed NA Battery operated devices (external) removed Heating patches and chemical warmers removed Titanium eyewear removed NA Nail polish cured greater than 10 hours NA Casting material cured greater than 10 hours NA Hearing aids removed Loose dentures or partials  removed NA Prosthetics have been removed NA Patient demonstrates correct use of air break device (if applicable) Patient concerns have been addressed Patient grounding bracelet on and cord attached to chamber Specifics for Inpatients (complete in addition to above) Medication sheet sent with patient NA Intravenous medications needed or due during therapy sent with  patient NA Drainage tubes (e.g. nasogastric tube or chest tube secured and vented) Endotracheal or Tracheotomy tube secured NA Cuff deflated of air and inflated with saline NA Airway suctioned NA Electronic Signature(Juarez) Signed: 01/16/2022 9:31:30 AM By: [name] EMT Entered By: [name] on 01/16/2022 09:31:30

## 2022-01-17 ENCOUNTER — Encounter (HOSPITAL_BASED_OUTPATIENT_CLINIC_OR_DEPARTMENT_OTHER): Payer: Medicare Other | Admitting: Internal Medicine

## 2022-01-17 ENCOUNTER — Other Ambulatory Visit: Payer: Self-pay

## 2022-01-17 DIAGNOSIS — Z85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, oral cavity, and pharynx: Secondary | ICD-10-CM

## 2022-01-17 DIAGNOSIS — R1319 Other dysphagia: Secondary | ICD-10-CM | POA: Diagnosis not present

## 2022-01-17 DIAGNOSIS — L598 Other specified disorders of the skin and subcutaneous tissue related to radiation: Secondary | ICD-10-CM | POA: Diagnosis not present

## 2022-01-17 DIAGNOSIS — Z510 Encounter for antineoplastic radiation therapy: Secondary | ICD-10-CM | POA: Diagnosis not present

## 2022-01-17 NOTE — Progress Notes (Signed)
[name], [name] ([id number]) Visit Report for 01/17/2022 Problem List Details Patient Name: Date of Service: [place], [name] 01/17/2022 10:00 A M Medical Record Number: [id number] Patient Account Number: [id number] Date of Birth/Sex: Treating RN: 1951-11-06 (71 y.o. 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 01/17/2022 Medical Record Number: [id number] Patient Account Number: [id number] Date of Birth/Sex: Treating RN: 05-Sep-1951 (71 y.o. 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, oral cavity, and pharynx Facility Procedures The patient  participates with Medicare or their insurance follows the Medicare Facility Guidelines: CPT4 Code Description Modifier Quantity [id number] G0277-(Facility Use Only) HBOT full body chamber, 74min , 4 ICD-10 Diagnosis Description L59.8 Other  specified disorders of the skin and subcutaneous tissue related to radiation Z51.0 Encounter for antineoplastic radiation therapy R13.19 Other dysphagia 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, oral cavity, and pharynx Physician Procedures : CPT4 Code Description Modifier [id number] - WC PHYS HYPERBARIC OXYGEN THERAPY ICD-10 Diagnosis Description L59.8 Other specified disorders of the skin and subcutaneous tissue related to radiation Z51.0 Encounter for antineoplastic radiation therapy  R13.19 Other dysphagia 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

## 2022-01-17 NOTE — Progress Notes (Addendum)
[name], [name] ([id number]) Visit Report for 01/17/2022 HBO Details Patient Name: Date of Service: [place], [name]. 01/17/2022 10:00 A M Medical Record Number: [id number] Patient Account Number: [id number] Date of Birth/Sex: Treating RN: [birth year] (71 y.o. 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 HBO Treatment 01/17/2022 End Date: HBO Discharge Treatment Series Complete; Non-Wound Protocol Outcome: Completed without Symptom Relief HBO Treatment Details Treatment Number: 39 Patient Type: Outpatient Chamber Type: Monoplace Chamber Serial #: [id number] Treatment Protocol: 2.0 ATA with 90 minutes oxygen, and no air breaks Treatment Details Compression Rate Down: 2.0 psi / minute De-Compression Rate Up: 2.0 psi / minute Air breaks and breathing Decompress Decompress Compress Tx Pressure Begins Reached periods Begins Ends (leave unused spaces blank) Chamber Pressure (ATA 1 2 ------2 1 ) Clock Time (24 hr) 09:14 09:21 - - - - - - 10:51 11:00 Treatment Length: 106 (minutes) Treatment Segments: 4 Vital Signs Capillary Blood Glucose Reference Range: 80 - 120 mg / dl HBO Diabetic Blood Glucose Intervention Range: <131 mg/dl or >016 mg/dl Time Vitals Blood Respiratory Capillary Blood Glucose Pulse Action Type: Pulse: Temperature: Taken: Pressure: Rate: Glucose (mg/dl): Meter #: Oximetry (%) Taken: Pre 09:12 118/52 80 16 98.2 100 Post 11:03 118/58 79 16 98.1 Treatment Response Treatment Toleration: Well Treatment Completion Status: Treatment Completed without Adverse Event Physician HBO Attestation: I certify  that I supervised this HBO treatment in accordance with Medicare guidelines. A trained emergency response team is readily available per Yes hospital policies and procedures. Continue HBOT as ordered. Yes Electronic Signature(s) Signed: 01/17/2022 1:53:56 PM By: Geralyn [name] DO Previous Signature: 01/17/2022 11:09:40 AM Version By: [name] EMT Entered By: Geralyn [name] on 01/17/2022 13:53:20 -------------------------------------------------------------------------------- HBO Safety Checklist Details Patient Name: Date of Service: [place], [name] 01/17/2022 10:00 A M Medical Record Number: [id number] Patient Account Number: [id number] Date of Birth/Sex: Treating RN: 08/09/1951 (71 y.o. [name] Primary Care [name]: [name] Other Clinician: [name] Referring Naleyah Ohlinger: Treating [name]/Extender: Cristopher Peru Weeks in Treatment: 13 HBO Safety Checklist Items Safety Checklist Consent Form Signed Patient voided / foley secured and emptied When did you last eato 0700 prg tube Last dose of injectable or oral agent NA Ostomy pouch emptied and vented if applicable NA All implantable devices assessed, documented and approved NA Intravenous access site secured and place NA Valuables secured Linens and cotton and cotton/polyester blend (less than 51% polyester) Personal oil-based products / skin lotions / body lotions removed Wigs or hairpieces removed NA Smoking or tobacco materials removed Books / newspapers / magazines / loose paper removed Cologne, aftershave, perfume and deodorant removed Jewelry removed (may wrap wedding band) Make-up removed NA Hair care products removed NA Battery operated devices (external) removed Heating patches and chemical warmers removed Titanium eyewear removed NA Nail polish cured greater than 10 hours NA Casting material cured greater than 10 hours NA Hearing aids removed Loose  dentures or partials removed NA Prosthetics have been removed NA Patient demonstrates correct use of air break device (if applicable) Patient concerns have been addressed Patient grounding bracelet on and cord attached to chamber Specifics for Inpatients (complete in addition to above) Medication sheet sent with patient NA Intravenous medications needed or due during therapy  sent with patient NA Drainage tubes (e.g. nasogastric tube or chest tube secured and vented) NA Endotracheal or Tracheotomy tube secured NA Cuff deflated of air and inflated with saline NA Airway suctioned NA Electronic Signature(s) Signed: 01/17/2022 9:56:59 AM By: [name] EMT Entered By: [name] on 01/17/2022 09:56:59

## 2022-01-21 NOTE — Progress Notes (Addendum)
RANARD, HARTE ([id number]) Visit Report for 01/16/2022 Arrival Information Details Patient Name: Date of Service: [place], [place]. 01/16/2022 10:00 A M Medical Record Number: [id number] Patient Account Number: [id number] Date of Birth/Sex: Treating RN: [birth year] (71 y.o. [name] Primary Care Rhyanna Sorce: [name] Other Clinician: Referring Starlene Consuegra: Treating Tacey Dimaggio/Extender: Tacey Ruiz in Treatment: 30 Visit Information History Since Last Visit All ordered tests and consults were completed: Yes Patient Arrived: Ambulatory Added or deleted any medications: No Arrival Time: 08:37 Any new allergies or adverse reactions: No Accompanied By: None Had a fall or experienced change in No Transfer Assistance: None activities of daily living that may affect Patient Identification Verified: Yes risk of falls: Secondary Verification Process Completed: Yes Signs or symptoms of abuse/neglect since last visito No Patient Requires Transmission-Based Precautions: No Hospitalized since last visit: No Patient Has Alerts: No Implantable device outside of the clinic excluding No cellular tissue based products placed in the center since last visit: Pain Present Now: No Notes Information entered from paper intake sheet. Electronic Signature(s) Signed: 01/16/2022 9:29:55 AM By: [name] Batman EMT Previous Signature: 01/16/2022 9:28:16 AM Version By: [name] Batman EMT Entered By: [name] Batman on 01/16/2022 09:29:55 -------------------------------------------------------------------------------- Encounter Discharge Information Details Patient Name: Date of Service: [name], [name] [name] 01/16/2022 10:00 A M Medical Record Number: [id number] Patient Account Number: [id number] Date of Birth/Sex: Treating RN: October 21, 1951 (71 y.o. [name] Primary Care Zuleica Seith: [name] Other Clinician: [name] Batman Referring Prinston Kynard: Treating Delford Wingert/Extender:  Tacey Ruiz in Treatment: 13 Encounter Discharge Information Items Discharge Condition: Stable Ambulatory Status: Ambulatory Discharge Destination: Home Transportation: Private Auto Accompanied By: None Schedule Follow-up Appointment: Yes Clinical Summary of Care: Electronic Signature(s) Signed: 01/16/2022 11:01:18 AM By: [name] Batman EMT Entered By: [name] Batman on 01/16/2022 11:01:17 -------------------------------------------------------------------------------- Patient/Caregiver Education Details Patient Name: Date of Service: [name], [name] 1/26/2023andnbsp10:00 [place] Record Number: [id number] Patient Account Number: [id number] Date of Birth/Gender: Treating RN: [birth year] (71 y.o. [name] Primary Care Physician: [name] Other Clinician: [name] Batman Referring Physician: Treating Physician/Extender: Tacey Ruiz in Treatment: 13 Education Assessment Education Provided To: Patient Education Topics Provided Electronic Signature(s) Signed: 01/17/2022 10:53:23 AM By: [name] Batman EMT Entered By: [name] Batman on 01/16/2022 10:57:33 -------------------------------------------------------------------------------- [place] Details Patient Name: Date of Service: [name], [name] 01/16/2022 10:00 A M Medical Record Number: [id number] Patient Account Number: [id number] Date of Birth/Sex: Treating RN: 03-Nov-1951 (71 y.o. [name] Primary Care Kealii Thueson: [name] Other Clinician: [name] Batman Referring Freeland Pracht: Treating Ryken Paschal/Extender: Tacey Ruiz in Treatment: 13 Vital Signs Time Taken: 08:49 Temperature (F): 98.1 Height (in): 68 Pulse (bpm): 85 Weight (lbs): 127 Respiratory Rate (breaths/min): 16 Body Mass Index (BMI): 19.3 Blood Pressure (mmHg): 127/60 Reference Range: 80 - 120 mg / dl Notes Information entered from paper intake  sheet. Electronic Signature(s) Signed: 01/16/2022 9:29:30 AM By: [name] Batman EMT Entered By: [name] Batman on 01/16/2022 88:91:69

## 2022-01-21 NOTE — Progress Notes (Addendum)
[name], [name] ([id number]) Visit Report for 01/13/2022 Arrival Information Details Patient Name: Date of Service: [place], [name] 01/13/2022 10:00 A M Medical Record Number: [id number] Patient Account Number: [id number] Date of Birth/Sex: Treating RN: 24-Nov-1951 (71 y.o. [name] Primary Care [name]: [name] Other Clinician: [name] Batman Referring Linet Brash: Treating Jedidiah Demartini/Extender: [name] in Treatment: 12 Visit Information History Since Last Visit All ordered tests and consults were completed: Yes Patient Arrived: Ambulatory Added or deleted any medications: No Arrival Time: 09:27 Any new allergies or adverse reactions: No Accompanied By: None Had a fall or experienced change in No Transfer Assistance: None activities of daily living that may affect Patient Identification Verified: Yes risk of falls: Secondary Verification Process Completed: Yes Signs or symptoms of abuse/neglect since last visito No Patient Requires Transmission-Based Precautions: No Hospitalized since last visit: No Patient Has Alerts: No Implantable device outside of the clinic excluding No cellular tissue based products placed in the center since last visit: Pain Present Now: Yes Notes Patient stated that he had pain in his abdomen, near peg tube area. Stated that he was out of his brand and was using another brand in his peg tube. Gave pain a 3 out of 10. I asked DR. Hoffman to see patient. Information entered from paper intake sheet. Electronic Signature(s) Signed: 01/13/2022 12:54:55 PM By: [name] Batman EMT Entered By: [name] Batman on 01/13/2022 12:54:55 -------------------------------------------------------------------------------- Encounter Discharge Information Details Patient Name: Date of Service: [place], [name] [name] 01/13/2022 10:00 A M Medical Record Number: [id number] Patient Account Number: [id number] Date of Birth/Sex: Treating  RN: 1951/07/02 (71 y.o. [name] Primary Care Alfie Rideaux: [name] Other Clinician: [name] Batman Referring Gail Creekmore: Treating Ilir Mahrt/Extender: [name] in Treatment: 12 Encounter Discharge Information Items Discharge Condition: Stable Ambulatory Status: Ambulatory Discharge Destination: Home Transportation: Private Auto Accompanied By: None Schedule Follow-up Appointment: Yes Clinical Summary of Care: Electronic Signature(s) Signed: 01/13/2022 1:01:10 PM By: [name] Batman EMT Entered By: [name] Batman on 01/13/2022 13:01:10 -------------------------------------------------------------------------------- Patient/Caregiver Education Details Patient Name: Date of Service: [name], [name] 1/23/2023andnbsp10:00 [place] Record Number: [id number] Patient Account Number: [id number] Date of Birth/Gender: Treating RN: 1951-11-19 (71 y.o. [name] Primary Care Physician: [name] Other Clinician: [name] Batman Referring Physician: Treating Physician/Extender: [name] in Treatment: 12 Education Assessment Education Provided To: Patient Education Topics Provided Electronic Signature(s) Signed: 01/14/2022 2:12:37 PM By: [name] Batman EMT Entered By: [name] Batman on 01/13/2022 13:00:53 -------------------------------------------------------------------------------- [place] Details Patient Name: Date of Service: [name], [name] 01/13/2022 10:00 A M Medical Record Number: [id number] Patient Account Number: [id number] Date of Birth/Sex: Treating RN: [birth year] (71 y.o. [name] Primary Care Jarmal Lewelling: [name] Other Clinician: [name] Batman Referring Alanie Syler: Treating Curtistine Pettitt/Extender: [name] Weeks in Treatment: 12 Vital Signs Time Taken: 09:57 Temperature (F): 98.2 Height (in): 68 Pulse (bpm): 73 Weight (lbs): 127 Respiratory  Rate (breaths/min): 16 Body Mass Index (BMI): 19.3 Blood Pressure (mmHg): 135/65 Reference Range: 80 - 120 mg / dl Airway Pulse Oximetry (%): 100 Electronic Signature(s) Signed: 01/13/2022 12:55:26 PM By: [name] Batman EMT Entered By: [name] Batman on 01/13/2022 12:55:26

## 2022-01-21 NOTE — Progress Notes (Addendum)
[name], [name] ([id number]) Visit Report for 01/17/2022 Arrival Information Details Patient Name: Date of Service: [place], [place]. 01/17/2022 10:00 A M Medical Record Number: [id number] Patient Account Number: [id number] Date of Birth/Sex: Treating RN: Oct 30, 1951 (71 y.o. [name] Primary Care [name]: [name] Other Clinician: [name] Juarez Referring [name]: Treating [name]/Extender: [name] in Treatment: 49 Visit Information History Since Last Visit All ordered tests and consults were completed: Yes Patient Arrived: Ambulatory Added or deleted any medications: No Arrival Time: 08:50 Any new allergies or adverse reactions: No Accompanied By: None Had a fall or experienced change in No Transfer Assistance: None activities of daily living that may affect Patient Identification Verified: Yes risk of falls: Secondary Verification Process Completed: Yes Signs or symptoms of abuse/neglect since last visito No Patient Requires Transmission-Based Precautions: No Hospitalized since last visit: No Patient Has Alerts: No Implantable device outside of the clinic excluding No cellular tissue based products placed in the center since last visit: Pain Present Now: No Notes Information entered from paper intake sheet. Electronic Signature(s) Signed: 01/17/2022 9:55:11 AM By: [name] EMT Entered By: [name] on 01/17/2022 09:55:11 -------------------------------------------------------------------------------- Encounter Discharge Information Details Patient Name: Date of Service: [place], [name] [name] S. 01/17/2022 10:00 A M Medical Record Number: [id number] Patient Account Number: [id number] Date of Birth/Sex: Treating RN: Aug 17, 1951 (71 y.o. [name] Primary Care [name]: [name] Other Clinician: [name] Juarez Referring [name]: Treating [name]/Extender: [name] in  Treatment: 13 Encounter Discharge Information Items Discharge Condition: Stable Ambulatory Status: Ambulatory Discharge Destination: Home Transportation: Private Auto Accompanied By: None Schedule Follow-up Appointment: No Clinical Summary of Care: Notes Patient to follow up with Dr. Francesca Juarez Electronic Signature(s) Signed: 01/17/2022 11:56:59 AM By: [name] EMT Entered By: [name] on 01/17/2022 11:56:58 -------------------------------------------------------------------------------- Patient/Caregiver Education Details Patient Name: Date of Service: [name], [place] [name] S. 1/27/2023andnbsp10:00 [name] Record Number: [id number] Patient Account Number: [id number] Date of Birth/Gender: Treating RN: Dec 21, 1951 (71 y.o. [name] Primary Care Physician: [name] Other Clinician: [name] Juarez Referring Physician: Treating Physician/Extender: [name] in Treatment: 13 Education Assessment Education Provided To: Patient Education Topics Provided Electronic Signature(s) Signed: 01/17/2022 11:57:23 AM By: [name] EMT Entered By: [name] on 01/17/2022 11:11:33 -------------------------------------------------------------------------------- [place] Details Patient Name: Date of Service: [name], [place] [name] S. 01/17/2022 10:00 A M Medical Record Number: [id number] Patient Account Number: [id number] Date of Birth/Sex: Treating RN: October 07, 1951 (71 y.o. [name] Primary Care [name]: [name] Other Clinician: [name] Juarez Referring [name]: Treating [name]/Extender: [name] Weeks in Treatment: 13 Vital Signs Time Taken: 09:12 Temperature (F): 98.2 Height (in): 68 Pulse (bpm): 80 Weight (lbs): 127 Respiratory Rate (breaths/min): 16 Body Mass Index (BMI): 19.3 Blood Pressure (mmHg): 118/52 Reference Range: 80 - 120 mg / dl Airway Pulse Oximetry (%):  100 Electronic Signature(s) Signed: 01/17/2022 9:55:43 AM By: [name] EMT Entered By: [name] on 01/17/2022 09:55:43

## 2022-01-27 ENCOUNTER — Encounter: Payer: Self-pay | Admitting: Family Medicine

## 2022-01-29 DIAGNOSIS — Z08 Encounter for follow-up examination after completed treatment for malignant neoplasm: Secondary | ICD-10-CM | POA: Diagnosis not present

## 2022-01-29 DIAGNOSIS — M542 Cervicalgia: Secondary | ICD-10-CM | POA: Diagnosis not present

## 2022-01-29 DIAGNOSIS — K117 Disturbances of salivary secretion: Secondary | ICD-10-CM | POA: Diagnosis not present

## 2022-01-29 DIAGNOSIS — Z8589 Personal history of malignant neoplasm of other organs and systems: Secondary | ICD-10-CM | POA: Diagnosis not present

## 2022-01-29 DIAGNOSIS — M79603 Pain in arm, unspecified: Secondary | ICD-10-CM | POA: Diagnosis not present

## 2022-01-29 DIAGNOSIS — C099 Malignant neoplasm of tonsil, unspecified: Secondary | ICD-10-CM | POA: Diagnosis not present

## 2022-01-29 DIAGNOSIS — Z931 Gastrostomy status: Secondary | ICD-10-CM | POA: Diagnosis not present

## 2022-01-29 DIAGNOSIS — R490 Dysphonia: Secondary | ICD-10-CM | POA: Diagnosis not present

## 2022-02-05 ENCOUNTER — Inpatient Hospital Stay (HOSPITAL_COMMUNITY): Payer: Medicare Other | Attending: Hematology

## 2022-02-06 ENCOUNTER — Encounter: Payer: Self-pay | Admitting: Family Medicine

## 2022-02-06 ENCOUNTER — Ambulatory Visit (INDEPENDENT_AMBULATORY_CARE_PROVIDER_SITE_OTHER): Payer: Medicare Other | Admitting: Family Medicine

## 2022-02-06 VITALS — BP 102/62 | HR 111 | Temp 98.8°F | Ht 68.0 in | Wt 129.0 lb

## 2022-02-06 DIAGNOSIS — T85848A Pain due to other internal prosthetic devices, implants and grafts, initial encounter: Secondary | ICD-10-CM | POA: Diagnosis not present

## 2022-02-06 DIAGNOSIS — K9422 Gastrostomy infection: Secondary | ICD-10-CM

## 2022-02-06 NOTE — Progress Notes (Signed)
Assessment & Plan:  1-2. Pain around PEG tube site, initial encounter/Infection of PEG site [facility]) Due to infection and patient being unable to take in nutrition, he was advised that he needed to go to the ER.  He was not agreeable to me calling EMS to come pick him up.  States he is unable to go today alone, but his sister is coming up from [place] first thing in the morning and she will take him as soon as she arrives.  Understands if pain worsens, he becomes dizzy/lightheaded, or he starts feeling worse he needs to call EMS to take him to the hospital.  I did consult Dr. Louanne Skye in our office about this patient.   Follow up plan: Return if symptoms worsen or fail to improve.  Deliah Boston, MSN, APRN, FNP-C [facility]  Subjective:   Patient ID: [name], male    DOB: 1951/02/11, 71 y.o.   MRN: [id number]  HPI: [name] is a 71 y.o. male presenting on 02/06/2022 for Abdominal Pain  Patient reports abdominal pain that started 3 days ago.  The pain is around his feeding tube that was placed in 2019 due to tonsillar cancer.  He reports he normally instills 2 cans through the PEG tube with each meal, but this morning he was only able to tolerate 1 can due to extreme pain.  He has not had anything since this morning and states he is starting to feel bad from not eating all day.   ROS: Negative unless specifically indicated above in HPI.   Relevant past medical history reviewed and updated as indicated.   Allergies and medications reviewed and updated.   Current Outpatient Medications:    albuterol (VENTOLIN HFA) 108 (90 Base) MCG/ACT inhaler, Inhale 2 puffs into the lungs every 6 (six) hours as needed for wheezing or shortness of breath., Disp: 8 g, Rfl: 2   Ensure Plus (ENSURE PLUS) LIQD, Give via PEG. 2 bottles in morning. 2 in afternoon. 1 before bed. (Patient taking differently: Place 237 mLs into feeding tube 2 (two) times daily between  meals. Pt uses 2 cans in the morning and 2 in the evening via peg tube), Disp: , Rfl: 0   gabapentin (NEURONTIN) 250 MG/5ML solution, TAKE 5 ML BY MOUTH TWICE DAILY THROUGH FEEDING TUBE, Disp: 300 mL, Rfl: 2   ibuprofen (ADVIL,MOTRIN) 200 MG tablet, Take 400 mg by mouth every 6 (six) hours as needed for moderate pain. , Disp: , Rfl:    levothyroxine (SYNTHROID) 112 MCG tablet, Take 1 tablet (112 mcg total) by mouth daily before breakfast., Disp: 90 tablet, Rfl: 3   OVER THE COUNTER MEDICATION, Naked juice protein, Disp: , Rfl:    OVER THE COUNTER MEDICATION, Take 1 Bottle by mouth daily. [place] Protein, Disp: , Rfl:    OVER THE COUNTER MEDICATION, Take 1 Bottle by mouth daily. [place] protein, Disp: , Rfl:    pregabalin (LYRICA) 150 MG capsule, Take 1 capsule (150 mg total) by mouth 2 (two) times daily. Feeding tube, Disp: 60 capsule, Rfl: 0  Not on File  Objective:   BP 102/62    Pulse (!) 111    Temp 98.8 F (37.1 C)    Ht 5\' 8"  (1.727 m)    Wt 129 lb (58.5 kg)    SpO2 95%    BMI 19.61 kg/m    Physical Exam Vitals reviewed.  Constitutional:      General: He is not  in acute distress.    Appearance: Normal appearance. He is not ill-appearing, toxic-appearing or diaphoretic.  HENT:     Head: Normocephalic and atraumatic.  Eyes:     General: No scleral icterus.       Right eye: No discharge.        Left eye: No discharge.     Conjunctiva/sclera: Conjunctivae normal.  Cardiovascular:     Rate and Rhythm: Normal rate.  Pulmonary:     Effort: Pulmonary effort is normal. No respiratory distress.  Abdominal:     General: Abdomen is flat.     Comments: Presence of PEG tube with surrounding erythema and induration.  Patient has pain when this area is touched or the PEG tube is manipulated.  Musculoskeletal:        General: Normal range of motion.     Cervical back: Normal range of motion.  Skin:    General: Skin is warm and dry.  Neurological:     Mental Status: He is alert  and oriented to person, place, and time. Mental status is at baseline.  Psychiatric:        Mood and Affect: Mood normal.        Behavior: Behavior normal.        Thought Content: Thought content normal.        Judgment: Judgment normal.

## 2022-02-07 ENCOUNTER — Emergency Department (HOSPITAL_COMMUNITY)
Admission: EM | Admit: 2022-02-07 | Discharge: 2022-02-07 | Disposition: A | Discharge status: Home / Self Care | Payer: Medicare Other | Attending: Emergency Medicine | Admitting: Emergency Medicine

## 2022-02-07 ENCOUNTER — Ambulatory Visit: Payer: Medicare Other | Admitting: Family

## 2022-02-07 ENCOUNTER — Encounter (HOSPITAL_COMMUNITY): Payer: Self-pay | Admitting: *Deleted

## 2022-02-07 DIAGNOSIS — L03311 Cellulitis of abdominal wall: Secondary | ICD-10-CM | POA: Insufficient documentation

## 2022-02-07 DIAGNOSIS — L0390 Cellulitis, unspecified: Secondary | ICD-10-CM

## 2022-02-07 DIAGNOSIS — Z8521 Personal history of malignant neoplasm of larynx: Secondary | ICD-10-CM | POA: Insufficient documentation

## 2022-02-07 DIAGNOSIS — R1013 Epigastric pain: Secondary | ICD-10-CM | POA: Diagnosis present

## 2022-02-07 MED ORDER — CEFTRIAXONE SODIUM 2 G IJ SOLR
2.0000 g | Freq: Once | INTRAMUSCULAR | Status: AC
  Administered 2022-02-07: 2 g via INTRAVENOUS
  Filled 2022-02-07: qty 20

## 2022-02-07 MED ORDER — HYDROCODONE-ACETAMINOPHEN 7.5-325 MG/15ML PO SOLN: ORAL | 0 refills | Status: AC

## 2022-02-07 MED ORDER — CEPHALEXIN 250 MG/5ML PO SUSR: 500.0000 mg | Freq: Four times a day (QID) | ORAL | 0 refills | Status: AC

## 2022-02-07 NOTE — ED Triage Notes (Signed)
Redness and swelling around feeding tube for the past 2 days

## 2022-02-07 NOTE — ED Provider Notes (Signed)
[facility] Provider Note   CSN: [id number] Arrival date & time: 02/07/22  1330     History  No chief complaint on file.   [name] is a 71 y.o. male.  Patient has a history of tonsillar cancer and has a PEG tube.  His PEG tube was hurting at the incision point but still work  The history is provided by the patient and medical records. No language interpreter was used.  Abdominal Pain Pain location:  Epigastric Pain quality: aching   Pain radiates to:  Does not radiate Pain severity:  Mild Onset quality:  Sudden Timing:  Constant Progression:  Worsening Chronicity:  New Context: not alcohol use   Relieved by:  Nothing Associated symptoms: no chest pain, no cough, no diarrhea, no fatigue and no hematuria       Home Medications Prior to Admission medications   Medication Sig Start Date End Date Taking? Authorizing Provider  cephALEXin (KEFLEX) 250 MG/5ML suspension Place 10 mLs (500 mg total) into feeding tube 4 (four) times daily for 7 days. 02/07/22 02/14/22 Yes Milton Ferguson, MD  HYDROcodone-acetaminophen (HYCET) 7.5-325 mg/15 ml solution Take one teaspoon every 6 hours for pain 02/07/22  Yes Milton Ferguson, MD  albuterol (VENTOLIN HFA) 108 (90 Base) MCG/ACT inhaler Inhale 2 puffs into the lungs every 6 (six) hours as needed for wheezing or shortness of breath. 11/28/21   Gwenlyn Perking, FNP  Ensure Plus (ENSURE PLUS) LIQD Give via PEG. 2 bottles in morning. 2 in afternoon. 1 before bed. Patient taking differently: Place 237 mLs into feeding tube 2 (two) times daily between meals. Pt uses 2 cans in the morning and 2 in the evening via peg tube 03/22/19   Lockamy, Randi L, NP-C  gabapentin (NEURONTIN) 250 MG/5ML solution TAKE 5 ML BY MOUTH TWICE DAILY THROUGH FEEDING TUBE 12/17/21   Gwenlyn Perking, FNP  ibuprofen (ADVIL,MOTRIN) 200 MG tablet Take 400 mg by mouth every 6 (six) hours as needed for moderate pain.     [provider]   levothyroxine (SYNTHROID) 112 MCG tablet Take 1 tablet (112 mcg total) by mouth daily before breakfast. 12/02/21   Gwenlyn Perking, FNP  OVER THE COUNTER MEDICATION Naked juice protein    [provider]  OVER THE COUNTER MEDICATION Take 1 Bottle by mouth daily. [employer] Furn[employer] conservator/restorer, Historical, MD  OVER THE COUNTER MEDICATION Take 1 Bottle by mouth daily. [place] protein    [provider]  pregabalin (LYRICA) 150 MG capsule Take 1 capsule (150 mg total) by mouth 2 (two) times daily. Feeding tube 09/29/20   Derek Jack, MD      Allergies    Patient has no known allergies.    Review of Systems   Review of Systems  Constitutional:  Negative for appetite change and fatigue.  HENT:  Negative for congestion, ear discharge and sinus pressure.   Eyes:  Negative for discharge.  Respiratory:  Negative for cough.   Cardiovascular:  Negative for chest pain.  Gastrointestinal:  Positive for abdominal pain. Negative for diarrhea.  Genitourinary:  Negative for frequency and hematuria.  Musculoskeletal:  Negative for back pain.  Skin:  Negative for rash.  Neurological:  Negative for seizures and headaches.  Psychiatric/Behavioral:  Negative for hallucinations.    Physical Exam Updated Vital Signs BP (!) 132/58    Pulse 94    Temp 99.3 F (37.4 C) (Oral)    Resp 20  SpO2 97%  Physical Exam Vitals and nursing note reviewed.  Constitutional:      Appearance: He is well-developed.  HENT:     Head: Normocephalic.     Nose: Nose normal.  Eyes:     General: No scleral icterus.    Conjunctiva/sclera: Conjunctivae normal.  Neck:     Thyroid: No thyromegaly.  Cardiovascular:     Rate and Rhythm: Normal rate and regular rhythm.     Heart sounds: No murmur heard.   No friction rub. No gallop.  Pulmonary:     Breath sounds: No stridor. No wheezing or rales.  Chest:     Chest wall: No tenderness.  Abdominal:     General: There is no distension.      Tenderness: There is abdominal tenderness. There is no rebound.     Comments: Patient has a PEG tube in place with swelling around the incision site.  Musculoskeletal:        General: Normal range of motion.     Cervical back: Neck supple.  Lymphadenopathy:     Cervical: No cervical adenopathy.  Skin:    Findings: No erythema or rash.  Neurological:     Mental Status: He is alert and oriented to person, place, and time.     Motor: No abnormal muscle tone.     Coordination: Coordination normal.  Psychiatric:        Behavior: Behavior normal.    ED Results / Procedures / Treatments   Labs (all labs ordered are listed, but only abnormal results are displayed) Labs Reviewed - No data to display  EKG None  Radiology No results found.  Procedures Procedures    Medications Ordered in ED Medications  cefTRIAXone (ROCEPHIN) 2 g in sodium chloride 0.9 % 100 mL IVPB (0 g Intravenous Stopped 02/07/22 1818)    ED Course/ Medical Decision Making/ A&P                           Medical Decision Making Risk Prescription drug management.   Patient will inflammation around the insertion area of the PEG tube possible cellulitis he is put on Keflex and will follow up with the surgeon next week.  I deflated the balloon a little bit and that seemed to help with the discomfort.  Also I contacted general surgery and they will make sure he will be seen on Tuesday for possible PEG tube insertion placed change   This patient presents to the ED for concern of abdominal pain, this involves an extensive number of treatment options, and is a complaint that carries with it a high risk of complications and morbidity.  The differential diagnosis includes cellulitis, abscess to abdomen,   Co morbidities that complicate the patient evaluation  Tonsillar cancer with PEG tube   Additional history obtained:  Additional history obtained from relative External records from outside source obtained  and reviewed including hospital records   Lab Tests:  No test ordered Imaging Studies ordered:  No imaging ordered  Cardiac Monitoring:  The patient was maintained on a cardiac monitor.  I personally viewed and interpreted the cardiac monitored which showed an underlying rhythm of: Normal sinus rhythm   Medicines ordered and prescription drug management:  I ordered medication including Rocephin for possible cellulitis Reevaluation of the patient after these medicines showed that the patient improved I have reviewed the patients home medicines and have made adjustments as needed   Test Considered:  CT abdomen   Critical Interventions:  None   Consultations Obtained:  I requested consultation with the general surgery,  and discussed lab and imaging findings as well as pertinent plan - they recommend: Follow-up Tuesday in the office   Problem List / ED Course:  Tonsillar cancer and PEG tube problems with possible cellulitis around the tube   Reevaluation:  After the interventions noted above, I reevaluated the patient and found that they have :improved   Social Determinants of Health:  None   Dispostion:  After consideration of the diagnostic results and the patients response to treatment, I feel that the patent would benefit from discharge home on antibiotics and follow-up with general surgery to set.          Final Clinical Impression(s) / ED Diagnoses Final diagnoses:  Cellulitis of skin    Rx / DC Orders ED Discharge Orders          Ordered    cephALEXin (KEFLEX) 250 MG/5ML suspension  4 times daily        02/07/22 1855    HYDROcodone-acetaminophen (HYCET) 7.5-325 mg/15 ml solution        02/07/22 Towanda Octave, MD 02/09/22 1120

## 2022-02-07 NOTE — Discharge Instructions (Signed)
Follow-up Tuesday with Dr. Arnoldo Morale  Return sooner if problems.  Take your antibiotics as prescribed call Dr. Arnoldo Morale office on Monday to find out when your appointment is Tuesday

## 2022-02-08 ENCOUNTER — Other Ambulatory Visit: Payer: Self-pay

## 2022-02-08 ENCOUNTER — Emergency Department (HOSPITAL_COMMUNITY)
Admission: EM | Admit: 2022-02-08 | Discharge: 2022-02-08 | Disposition: A | Discharge status: Home / Self Care | Payer: Medicare Other | Attending: Emergency Medicine | Admitting: Emergency Medicine

## 2022-02-08 ENCOUNTER — Encounter (HOSPITAL_COMMUNITY): Payer: Self-pay | Admitting: *Deleted

## 2022-02-08 ENCOUNTER — Emergency Department (HOSPITAL_COMMUNITY): Payer: Medicare Other

## 2022-02-08 DIAGNOSIS — Z4682 Encounter for fitting and adjustment of non-vascular catheter: Secondary | ICD-10-CM | POA: Diagnosis not present

## 2022-02-08 DIAGNOSIS — K9423 Gastrostomy malfunction: Secondary | ICD-10-CM

## 2022-02-08 MED ORDER — DIATRIZOATE MEGLUMINE & SODIUM 66-10 % PO SOLN
ORAL | Status: AC
  Filled 2022-02-08: qty 60

## 2022-02-08 NOTE — ED Triage Notes (Signed)
Pt's feeding tube came out today.

## 2022-02-08 NOTE — ED Notes (Signed)
Gauze dressing and hyperflex tape dressing placed. Patient tolerated well.

## 2022-02-08 NOTE — ED Provider Notes (Signed)
[facility] EMERGENCY DEPARTMENT Provider Note   CSN: [id number] Arrival date & time: 02/08/22  1557     History  Chief Complaint  Patient presents with   Post-op Problem    [name] is a 71 y.o. male with a chronic indwelling PEG tube since 2019 secondary to tonsillar cancer presenting for evaluation of problems with his PEG tube.  The tube essentially fell out today.  He was seen here yesterday for complaints of redness around the PEG tube site and he was placed on Keflex for treatment of cellulitis which he is currently taking.  He does have some mild discomfort at the site but denies any other symptoms including fevers or chills or any other significant complaints.  He is scheduled to see Dr. Arnoldo Morale in follow-up care in 3 days.  The history is provided by the patient and the spouse.      Home Medications Prior to Admission medications   Medication Sig Start Date End Date Taking? Authorizing Provider  albuterol (VENTOLIN HFA) 108 (90 Base) MCG/ACT inhaler Inhale 2 puffs into the lungs every 6 (six) hours as needed for wheezing or shortness of breath. 11/28/21   Gwenlyn Perking, FNP  cephALEXin (KEFLEX) 250 MG/5ML suspension Place 10 mLs (500 mg total) into feeding tube 4 (four) times daily for 7 days. 02/07/22 02/14/22  Milton Ferguson, MD  Ensure Plus (ENSURE PLUS) LIQD Give via PEG. 2 bottles in morning. 2 in afternoon. 1 before bed. Patient taking differently: Place 237 mLs into feeding tube 2 (two) times daily between meals. Pt uses 2 cans in the morning and 2 in the evening via peg tube 03/22/19   Lockamy, Randi L, NP-C  gabapentin (NEURONTIN) 250 MG/5ML solution TAKE 5 ML BY MOUTH TWICE DAILY THROUGH FEEDING TUBE 12/17/21   Gwenlyn Perking, FNP  HYDROcodone-acetaminophen (HYCET) 7.5-325 mg/15 ml solution Take one teaspoon every 6 hours for pain 02/07/22   Milton Ferguson, MD  ibuprofen (ADVIL,MOTRIN) 200 MG tablet Take 400 mg by mouth every 6 (six) hours as needed for  moderate pain.     [provider]  levothyroxine (SYNTHROID) 112 MCG tablet Take 1 tablet (112 mcg total) by mouth daily before breakfast. 12/02/21   Gwenlyn Perking, FNP  OVER THE COUNTER MEDICATION Naked juice protein    [provider]  OVER THE COUNTER MEDICATION Take 1 Bottle by mouth daily. [employer] Furn[employer] conservator/restorer, Historical, MD  OVER THE COUNTER MEDICATION Take 1 Bottle by mouth daily. [employer] protein    [provider]  pregabalin (LYRICA) 150 MG capsule Take 1 capsule (150 mg total) by mouth 2 (two) times daily. Feeding tube 09/29/20   Derek Jack, MD      Allergies    Patient has no known allergies.    Review of Systems   Review of Systems  Constitutional:  Negative for chills and fever.  HENT:  Negative for congestion and sore throat.   Eyes: Negative.   Respiratory:  Negative for chest tightness and shortness of breath.   Cardiovascular:  Negative for chest pain.  Gastrointestinal:  Negative for abdominal pain, nausea and vomiting.  Genitourinary: Negative.   Musculoskeletal:  Negative for arthralgias, joint swelling and neck pain.  Skin:  Positive for color change. Negative for rash and wound.  Neurological:  Negative for dizziness, weakness, light-headedness, numbness and headaches.  Psychiatric/Behavioral: Negative.    All other systems reviewed and are negative.  Physical Exam Updated Vital Signs BP  133/66 (BP Location: Left Arm)    Pulse (!) 107    Temp 98.2 F (36.8 C) (Oral)    Resp 18    SpO2 98%  Physical Exam Vitals and nursing note reviewed.  Constitutional:      Appearance: He is well-developed.  HENT:     Head: Normocephalic and atraumatic.  Eyes:     Conjunctiva/sclera: Conjunctivae normal.  Cardiovascular:     Rate and Rhythm: Normal rate and regular rhythm.     Heart sounds: Normal heart sounds.  Pulmonary:     Effort: Pulmonary effort is normal.     Breath sounds: Normal breath sounds. No  wheezing.  Abdominal:     General: Bowel sounds are normal.     Palpations: Abdomen is soft.     Tenderness: There is no abdominal tenderness.  Musculoskeletal:        General: Normal range of motion.     Cervical back: Normal range of motion.  Skin:    General: Skin is warm and dry.     Comments: Mild erythema immediately surrounding the ostomy site.  There is no purulent drainage, no induration.   Neurological:     Mental Status: He is alert.    ED Results / Procedures / Treatments   Labs (all labs ordered are listed, but only abnormal results are displayed) Labs Reviewed - No data to display  EKG None  Radiology DG Abdomen 1 View  Result Date: 02/08/2022 CLINICAL DATA:  Peg tube came out today and was replaced. EXAM: ABDOMEN - 1 VIEW COMPARISON:  None. FINDINGS: KUB shows a PEG tube overlying the midline abdomen with distal tip over the paramidline upper abdomen/stomach. 50 cc water-soluble contrast was injected via the PEG tube which opacifies the gastric lumen and contrast is seen extending into the duodenum out to the ligament of Treitz. No evidence for contrast extravasation into the peritoneal cavity. There is some contrast staining of clothing over the midline upper pelvis, compatible with external spill of contrast at the time of injection (and I did confirm with the technologist, [place], that there was some spill of contrast external to the patient during injection). IMPRESSION: Injected contrast opacifies the gastric lumen and flows into the duodenum out to the ligament of Treitz consistent with appropriate PEG tube placement. No evidence for free contrast extravasation into the peritoneal cavity. Electronically Signed   By: Misty Stanley M.D.   On: 02/08/2022 17:55    Procedures FEEDING TUBE REPLACEMENT  Date/Time: 02/08/2022 6:21 PM Performed by: Evalee Jefferson, PA-C Authorized by: Evalee Jefferson, PA-C  Consent: Verbal consent obtained. Risks and benefits: risks, benefits  and alternatives were discussed Consent given by: patient Patient understanding: patient states understanding of the procedure being performed Patient consent: the patient's understanding of the procedure matches consent given Patient identity confirmed: verbally with patient Time out: Immediately prior to procedure a "time out" was called to verify the correct patient, procedure, equipment, support staff and site/side marked as required. Preparation: Patient was prepped and draped in the usual sterile fashion. Indications: tube dislodged Local anesthesia used: no  Anesthesia: Local anesthesia used: no Tube type: gastrostomy Tube size: 18 Fr Endoscope used: no Bulb inflation volume: 6 (ml) Bulb inflation fluid: normal saline Placement/position confirmation: x-ray and contrast Tube placement difficulty: none Patient tolerance: patient tolerated the procedure well with no immediate complications        Medications Ordered in ED Medications  diatrizoate meglumine-sodium (GASTROGRAFIN) 66-10 % solution (has no administration in  time range)    ED Course/ Medical Decision Making/ A&P                           Medical Decision Making Pt with accidental PEG tube removal, seasoned tube.  Amount and/or Complexity of Data Reviewed Radiology: ordered.    Details: Post replacement with gastrograffin used to confirm proper peg tube placement.  Risk Risk Details: Pt to complete abx prescribed yesterday for periostomy cellulitis.  He see's Dr. Arnoldo Morale in f/u in 3 days.             Final Clinical Impression(s) / ED Diagnoses Final diagnoses:  PEG tube malfunction [facility])    Rx / DC Orders ED Discharge Orders     None         Landis Martins 02/08/22 1843    Daleen Bo, MD 02/09/22 [phone]

## 2022-02-08 NOTE — Discharge Instructions (Addendum)
Your Peg tube is placed appropriately for use. Complete the antibiotics you were prescribed yesterday.  Plan to keep your appointment with Dr. Arnoldo Morale on Tuesday per your instructions from here yesterday.

## 2022-02-09 ENCOUNTER — Other Ambulatory Visit (HOSPITAL_COMMUNITY): Payer: Self-pay | Admitting: Emergency Medicine

## 2022-02-09 ENCOUNTER — Encounter: Payer: Self-pay | Admitting: Family Medicine

## 2022-02-09 MED ORDER — DIATRIZOATE MEGLUMINE & SODIUM 66-10 % PO SOLN: 45.0000 mL | Freq: Once | ORAL | Status: AC

## 2022-02-11 ENCOUNTER — Encounter: Payer: Self-pay | Admitting: General Surgery

## 2022-02-11 ENCOUNTER — Other Ambulatory Visit: Payer: Self-pay

## 2022-02-11 ENCOUNTER — Ambulatory Visit (INDEPENDENT_AMBULATORY_CARE_PROVIDER_SITE_OTHER): Payer: Medicare Other | Admitting: General Surgery

## 2022-02-11 VITALS — BP 115/69 | HR 90 | Temp 97.4°F | Resp 16 | Ht 68.0 in | Wt 116.0 lb

## 2022-02-11 DIAGNOSIS — K9422 Gastrostomy infection: Secondary | ICD-10-CM

## 2022-02-12 ENCOUNTER — Ambulatory Visit (HOSPITAL_COMMUNITY): Payer: Medicare Other | Admitting: Hematology

## 2022-02-12 NOTE — Progress Notes (Signed)
Subjective:     [name]  Patient presents for ER follow-up for treatment of cellulitis around his PEG site and recent replacement of his PEG.  His PEG had fallen out and was replaced in the emergency room.  He was started on antibiotic for cellulitis around the PEG site.  Patient states he has had some leakage of tube feeding.  He has started placing Neosporin ointment around the PEG site. Objective:    BP 115/69    Pulse 90    Temp (!) 97.4 F (36.3 C) (Other (Comment))    Resp 16    Ht 5\' 8"  (1.727 m)    Wt 116 lb (52.6 kg)    SpO2 98%    BMI 17.64 kg/m   General:  alert, cooperative, and no distress  Lungs are clear to auscultation with equal breath sounds bilaterally Heart examination reveals regular rate and rhythm without S3, S4, murmurs Abdomen is soft.  There is irritation around the PEG site without significant purulent drainage.  I did tighten the bolster to 2-1/2 cm Torina Ey.     Assessment:    PEG site infection/inflammation    Plan:   I told the patient to rotate the bolster so that it does not irritate the skin.  He will apply antibiotic ointment daily and keep the wound clean and dry with soap and water.  Follow-up here in 2 weeks to check the PEG site.

## 2022-02-13 ENCOUNTER — Ambulatory Visit: Payer: Medicare Other | Admitting: General Surgery

## 2022-02-14 DIAGNOSIS — C099 Malignant neoplasm of tonsil, unspecified: Secondary | ICD-10-CM | POA: Diagnosis not present

## 2022-02-14 DIAGNOSIS — J387 Other diseases of larynx: Secondary | ICD-10-CM | POA: Diagnosis not present

## 2022-02-14 DIAGNOSIS — R1310 Dysphagia, unspecified: Secondary | ICD-10-CM | POA: Diagnosis not present

## 2022-02-26 ENCOUNTER — Encounter: Payer: Self-pay | Admitting: General Surgery

## 2022-02-26 ENCOUNTER — Ambulatory Visit (INDEPENDENT_AMBULATORY_CARE_PROVIDER_SITE_OTHER): Payer: Medicare Other | Admitting: General Surgery

## 2022-02-26 ENCOUNTER — Other Ambulatory Visit: Payer: Self-pay

## 2022-02-26 VITALS — BP 114/66 | HR 93 | Temp 97.2°F | Resp 16 | Ht 68.0 in | Wt 124.0 lb

## 2022-02-26 DIAGNOSIS — K9422 Gastrostomy infection: Secondary | ICD-10-CM

## 2022-02-26 NOTE — Progress Notes (Signed)
Subjective:  ?  ? [name]  ?Here for follow-up wound check.  PEG site is much improved.  He has been applying a zinc based cream around the PEG site.  He has finished his antibiotic course.  He is not having any leakage around the site.  He has been adjusting the bolster as needed. ?Objective:  ? ? BP 114/66   Pulse 93   Temp (!) 97.2 ?F (36.2 ?C) (Other (Comment))   Resp 16   Ht '5\' 8"'$  (1.727 m)   Wt 124 lb (56.2 kg)   SpO2 92%   BMI 18.85 kg/m?  ? ?General:  alert, cooperative, and no distress  ?Abdomen soft, PEG site much improved without purulent drainage.  Healing epidermis is present circumferentially. ?   ? ?Assessment:  ? ? PEG site inflammation resolving  ?  ?Plan:  ? ?Continue keeping PEG site clean and dry with soap and water and applying the cream.  Follow-up here as needed. ?

## 2022-03-10 DIAGNOSIS — Z85819 Personal history of malignant neoplasm of unspecified site of lip, oral cavity, and pharynx: Secondary | ICD-10-CM | POA: Diagnosis not present

## 2022-03-10 DIAGNOSIS — H6123 Impacted cerumen, bilateral: Secondary | ICD-10-CM | POA: Diagnosis not present

## 2022-03-12 DIAGNOSIS — R1310 Dysphagia, unspecified: Secondary | ICD-10-CM | POA: Diagnosis not present

## 2022-03-12 DIAGNOSIS — C099 Malignant neoplasm of tonsil, unspecified: Secondary | ICD-10-CM | POA: Diagnosis not present

## 2022-03-12 DIAGNOSIS — J387 Other diseases of larynx: Secondary | ICD-10-CM | POA: Diagnosis not present

## 2022-03-20 ENCOUNTER — Other Ambulatory Visit (HOSPITAL_COMMUNITY): Payer: Self-pay | Admitting: *Deleted

## 2022-03-26 ENCOUNTER — Other Ambulatory Visit: Payer: Self-pay | Admitting: Family Medicine

## 2022-04-11 ENCOUNTER — Ambulatory Visit (INDEPENDENT_AMBULATORY_CARE_PROVIDER_SITE_OTHER): Payer: Medicare Other

## 2022-04-11 VITALS — Wt 121.0 lb

## 2022-04-11 DIAGNOSIS — Z0000 Encounter for general adult medical examination without abnormal findings: Secondary | ICD-10-CM

## 2022-04-11 NOTE — Progress Notes (Signed)
? ?Subjective:  ? [name] is a 71 y.o. male who presents for Medicare Annual/Subsequent preventive examination. ? ?Virtual Visit via Telephone Note ? ?I connected with  [name] on 04/11/22 at 10:30 AM EDT by telephone and verified that I am speaking with the correct person using two identifiers. ? ?Location: ?Patient: Home ?Provider: [facility] ?Persons participating in the virtual visit: patient/Nurse Health Advisor ?  ?I discussed the limitations, risks, security and privacy concerns of performing an evaluation and management service by telephone and the availability of in person appointments. The patient expressed understanding and agreed to proceed. ? ?Interactive audio and video telecommunications were attempted between this nurse and patient, however failed, due to patient having technical difficulties OR patient did not have access to video capability.  We continued and completed visit with audio only. ? ?Some vital signs may be absent or patient reported.  ? ?[name] Glodowski Dionne Ano, LPN  ? ?Review of Systems    ? ?Cardiac Risk Factors include: advanced age (>54mn, >>32women);dyslipidemia;male gender;sedentary lifestyle;Other (see comment), Risk factor comments: pancytopenia, malnutrition, gastrostomy tube dependent, tonsil cancer ? ?   ?Objective:  ?  ?Today's Vitals  ? 04/11/22 1031  ?Weight: 121 lb (54.9 kg)  ?PainSc: 8   ? ?Body mass index is 18.4 kg/m?. ? ? ?  04/11/2022  ? 10:35 AM 02/07/2022  ?  2:53 PM 08/05/2021  ?  2:46 PM 04/10/2021  ? 11:03 AM 03/14/2021  ?  1:08 PM 01/29/2021  ?  3:07 PM 03/14/2020  ?  3:28 PM  ?Advanced Directives  ?Does Patient Have a Medical Advance Directive? No No No No No No No  ?Would patient like information on creating a medical advance directive? No - Patient declined No - Patient declined No - Patient declined Yes (MAU/Ambulatory/Procedural Areas - Information given) No - Patient declined No - Patient declined No - Patient declined  ? ? ?Current Medications  (verified) ?Outpatient Encounter Medications as of 04/11/2022  ?Medication Sig  ? albuterol (VENTOLIN HFA) 108 (90 Base) MCG/ACT inhaler Inhale 2 puffs into the lungs every 6 (six) hours as needed for wheezing or shortness of breath.  ? Ensure Plus (ENSURE PLUS) LIQD Give via PEG. 2 bottles in morning. 2 in afternoon. 1 before bed. (Patient taking differently: Place 237 mLs into feeding tube 2 (two) times daily between meals. Pt uses 2 cans in the morning and 2 in the evening via peg tube)  ? gabapentin (NEURONTIN) 250 MG/5ML solution TAKE 5 ML BY MOUTH TWICE DAILY THROUGH FEEDING TUBE  ? HYDROcodone-acetaminophen (HYCET) 7.5-325 mg/15 ml solution Take one teaspoon every 6 hours for pain  ? ibuprofen (ADVIL,MOTRIN) 200 MG tablet Take 400 mg by mouth every 6 (six) hours as needed for moderate pain.   ? levothyroxine (SYNTHROID) 112 MCG tablet Take 1 tablet (112 mcg total) by mouth daily before breakfast.  ? OVER THE COUNTER MEDICATION Naked juice protein  ? OVER THE COUNTER MEDICATION Take 1 Bottle by mouth daily. [place] [employer] Protein  ? OVER THE COUNTER MEDICATION Take 1 Bottle by mouth daily. [place] protein  ? pregabalin (LYRICA) 150 MG capsule Take 1 capsule (150 mg total) by mouth 2 (two) times daily. Feeding tube  ? ?Facility-Administered Encounter Medications as of 04/11/2022  ?Medication  ? diatrizoate meglumine-sodium (GASTROGRAFIN) 66-10 % solution 45 mL  ? ? ?Allergies (verified) ?Patient has no known allergies.  ? ?History: ?Past Medical History:  ?Diagnosis Date  ? HOH (hard of hearing)   ?  Hyperlipidemia   ? Mass of oral cavity 05/12/2018  ? Positive FIT (fecal immunochemical test) 05/12/2018  ? PTSD (post-traumatic stress disorder)   ? has not been offically diagnosised  ? Tonsillar cancer ([place]) 06/02/2018  ? ?Past Surgical History:  ?Procedure Laterality Date  ? APPENDECTOMY  1969  ? COLONOSCOPY W/ POLYPECTOMY    ? remote past  ? COLONOSCOPY WITH PROPOFOL N/A 02/16/2020  ? Procedure: COLONOSCOPY WITH  PROPOFOL;  Surgeon: Daneil Dolin, MD;  [facility];  Service: Endoscopy;  Laterality: N/A;  1:45pm  ? ESOPHAGOGASTRODUODENOSCOPY  08/04/2018  ? Dr. Arnoldo Morale: erythema of larynx, normal stomach, normal duodenum. PEG Placed 20 F, bumper at 2.5 cm  ? ESOPHAGOGASTRODUODENOSCOPY (EGD) WITH PROPOFOL N/A 08/04/2018  ? Procedure: ESOPHAGOGASTRODUODENOSCOPY (EGD) WITH PROPOFOL;  Surgeon: Aviva Signs, MD;  [facility];  Service: Gastroenterology;  Laterality: N/A;  ? ESOPHAGOGASTRODUODENOSCOPY (EGD) WITH PROPOFOL N/A 02/16/2020  ? Procedure: ESOPHAGOGASTRODUODENOSCOPY (EGD) WITH PROPOFOL;  Surgeon: Daneil Dolin, MD;  [facility];  Service: Endoscopy;  Laterality: N/A;  ? FINGER FRACTURE SURGERY Left   ? MULTIPLE EXTRACTIONS WITH ALVEOLOPLASTY N/A 06/15/2018  ? Procedure: Extraction of tooth #'s 2,12,13,18,20,and 21 with alveoloplasty and gross debridement of remaining teeth;  Surgeon: Lenn Cal, DDS;  Location: [facility];  Service: Oral Surgery;  Laterality: N/A;  ? PEG PLACEMENT N/A 08/04/2018  ? Procedure: PERCUTANEOUS ENDOSCOPIC GASTROSTOMY (PEG) PLACEMENT;  Surgeon: Aviva Signs, MD;  [facility];  Service: Gastroenterology;  Laterality: N/A;  ? POLYPECTOMY  02/16/2020  ? Procedure: POLYPECTOMY;  Surgeon: Daneil Dolin, MD;  [facility];  Service: Endoscopy;;  ? PORTACATH PLACEMENT Left 07/02/2018  ? Procedure: INSERTION PORT-A-CATH;  Surgeon: Aviva Signs, MD;  [facility];  Service: General;  Laterality: Left;  ? ?Family History  ?Problem Relation Age of Onset  ? Diabetes Father   ?     died at age 74   ? Colon cancer Neg Hx   ? ?Social History  ? ?Socioeconomic History  ? Marital status: Divorced  ?  Spouse name: Not on file  ? Number of children: Not on file  ? Years of education: Not on file  ? Highest education level: Not on file  ?Occupational History  ? Occupation: English as a second language teacher, retired  ?Tobacco Use  ? Smoking status: Former  ?  Years:  15.00  ?  Types: Cigarettes  ?  Quit date: 2000  ?  Years since quitting: 23.3  ? Smokeless tobacco: Never  ?Vaping Use  ? Vaping Use: Never used  ?Substance and Sexual Activity  ? Alcohol use: Not Currently  ?  Alcohol/week: 12.0 standard drinks  ?  Types: 12 Cans of beer per week  ? Drug use: Never  ? Sexual activity: Not Currently  ?Other Topics Concern  ? Not on file  ?Social History Narrative  ? ** Merged History Encounter **  ?    ? Mr. Streater is a pleasant gentleman who is a retired English as a second language teacher.  He lives independently with his dog here in [place].  He formally lived in [place].  ? ?Social Determinants of Health  ? ?Financial Resource Strain: Not on file  ?Food Insecurity: No Food Insecurity  ? Worried About Charity fundraiser in the Last Year: Never true  ? Ran Out of Food in the Last Year: Never true  ?Transportation Needs: No Transportation Needs  ? Lack of Transportation (Medical): No  ? Lack of Transportation (Non-Medical):  No  ?Physical Activity: Insufficiently Active  ? Days of Exercise per Week: 7 days  ? Minutes of Exercise per Session: 10 min  ?Stress: No Stress Concern Present  ? Feeling of Stress : Only a little  ?Social Connections: Socially Isolated  ? Frequency of Communication with Friends and Family: Once a week  ? Frequency of Social Gatherings with Friends and Family: Never  ? Attends Religious Services: Never  ? Active Member of Clubs or Organizations: No  ? Attends Archivist Meetings: Never  ? Marital Status: Divorced  ? ? ?Tobacco Counseling ?Counseling given: Not Answered ? ? ?Clinical Intake: ? ?Pre-visit preparation completed: Yes ? ?Pain : 0-10 ?Pain Score: 8  ?Pain Type: Other (Comment) (radiation/chemo treatments for throat cancer) ?Pain Location: Neck ?Pain Orientation: Anterior ?Pain Descriptors / Indicators: Sore, Tender, Throbbing ?Pain Onset: More than a month ago ?Pain Frequency: Intermittent ? ?  ? ?BMI - recorded: 18.4 ?Nutritional Status: BMI <19   Underweight ?Nutritional Risks: Nausea/ vomitting/ diarrhea ?Diabetes: No ? ?How often do you need to have someone help you when you read instructions, pamphlets, or other written materials from your doctor or pharmacy?: 1 - Never ? ?[place]

## 2022-04-11 NOTE — Patient Instructions (Signed)
[name] , ?Thank you for taking time to come for your Medicare Wellness Visit. I appreciate your ongoing commitment to your health goals. Please review the following plan we discussed and let me know if I can assist you in the future.  ? ?Screening recommendations/referrals: ?Colonoscopy: Done 02/16/2020 - no repeat required ?Recommended yearly ophthalmology/optometry visit for glaucoma screening and checkup ?Recommended yearly dental visit for hygiene and checkup ? ?Vaccinations: ?Influenza vaccine: Done 09/11/2021 - Repeat annually ?Pneumococcal vaccine: Done 04/02/2021 - due for Pneumovax-23 ?Tdap vaccine: Done 04/02/2021 - Repeat in 10 years ?Shingles vaccine: Due - Shingrix is 2 doses 2-6 months apart and over 90% effective     ?Covid-19: Done 03/22/2020, 04/17/2020, & 09/18/2020 ? ?Advanced directives: Advance directive discussed with you today. Even though you declined this today, please call our office should you change your mind, and we can give you the proper paperwork for you to fill out.  ? ?Conditions/risks identified: Aim for 30 minutes of exercise or brisk walking, 6-8 glasses of water, and 5 servings of fruits and vegetables each day.  ? ?Next appointment: Follow up in one year for your annual wellness visit.  ? ?Preventive Care 48 Years and Older, Male ? ?Preventive care refers to lifestyle choices and visits with your health care provider that can promote health and wellness. ?What does preventive care include? ?A yearly physical exam. This is also called an annual well check. ?Dental exams once or twice a year. ?Routine eye exams. Ask your health care provider how often you should have your eyes checked. ?Personal lifestyle choices, including: ?Daily care of your teeth and gums. ?Regular physical activity. ?Eating a healthy diet. ?Avoiding tobacco and drug use. ?Limiting alcohol use. ?Practicing safe sex. ?Taking low doses of aspirin every day. ?Taking vitamin and mineral supplements as recommended by  your health care provider. ?What happens during an annual well check? ?The services and screenings done by your health care provider during your annual well check will depend on your age, overall health, lifestyle risk factors, and family history of disease. ?Counseling  ?Your health care provider may ask you questions about your: ?Alcohol use. ?Tobacco use. ?Drug use. ?Emotional well-being. ?Home and relationship well-being. ?Sexual activity. ?Eating habits. ?History of falls. ?Memory and ability to understand (cognition). ?Work and work Statistician. ?Screening  ?You may have the following tests or measurements: ?Height, weight, and BMI. ?Blood pressure. ?Lipid and cholesterol levels. These may be checked every 5 years, or more frequently if you are over 54 years old. ?Skin check. ?Lung cancer screening. You may have this screening every year starting at age 44 if you have a 30-pack-year history of smoking and currently smoke or have quit within the past 15 years. ?Fecal occult blood test (FOBT) of the stool. You may have this test every year starting at age 13. ?Flexible sigmoidoscopy or colonoscopy. You may have a sigmoidoscopy every 5 years or a colonoscopy every 10 years starting at age 52. ?Prostate cancer screening. Recommendations will vary depending on your family history and other risks. ?Hepatitis C blood test. ?Hepatitis B blood test. ?Sexually transmitted disease (STD) testing. ?Diabetes screening. This is done by checking your blood sugar (glucose) after you have not eaten for a while (fasting). You may have this done every 1-3 years. ?Abdominal aortic aneurysm (AAA) screening. You may need this if you are a current or former smoker. ?Osteoporosis. You may be screened starting at age 50 if you are at high risk. ?Talk with your health care  provider about your test results, treatment options, and if necessary, the need for more tests. ?Vaccines  ?Your health care provider may recommend certain vaccines,  such as: ?Influenza vaccine. This is recommended every year. ?Tetanus, diphtheria, and acellular pertussis (Tdap, Td) vaccine. You may need a Td booster every 10 years. ?Zoster vaccine. You may need this after age 83. ?Pneumococcal 13-valent conjugate (PCV13) vaccine. One dose is recommended after age 15. ?Pneumococcal polysaccharide (PPSV23) vaccine. One dose is recommended after age 67. ?Talk to your health care provider about which screenings and vaccines you need and how often you need them. ?This information is not intended to replace advice given to you by your health care provider. Make sure you discuss any questions you have with your health care provider. ?Document Released: 01/04/2016 Document Revised: 08/27/2016 Document Reviewed: 10/09/2015 ?Elsevier Interactive Patient Education ? 2017 [place]. ? ?Fall Prevention in the Home ?Falls can cause injuries. They can happen to people of all ages. There are many things you can do to make your home safe and to help prevent falls. ?What can I do on the outside of my home? ?Regularly fix the edges of walkways and driveways and fix any cracks. ?Remove anything that might make you trip as you walk through a door, such as a raised step or threshold. ?Trim any bushes or trees on the path to your home. ?Use bright outdoor lighting. ?Clear any walking paths of anything that might make someone trip, such as rocks or tools. ?Regularly check to see if handrails are loose or broken. Make sure that both sides of any steps have handrails. ?Any raised decks and porches should have guardrails on the edges. ?Have any leaves, snow, or ice cleared regularly. ?Use sand or salt on walking paths during winter. ?Clean up any spills in your garage right away. This includes oil or grease spills. ?What can I do in the bathroom? ?Use night lights. ?Install grab bars by the toilet and in the tub and shower. Do not use towel bars as grab bars. ?Use non-skid mats or decals in the tub or  shower. ?If you need to sit down in the shower, use a plastic, non-slip stool. ?Keep the floor dry. Clean up any water that spills on the floor as soon as it happens. ?Remove soap buildup in the tub or shower regularly. ?Attach bath mats securely with double-sided non-slip rug tape. ?Do not have throw rugs and other things on the floor that can make you trip. ?What can I do in the bedroom? ?Use night lights. ?Make sure that you have a light by your bed that is easy to reach. ?Do not use any sheets or blankets that are too big for your bed. They should not hang down onto the floor. ?Have a firm chair that has side arms. You can use this for support while you get dressed. ?Do not have throw rugs and other things on the floor that can make you trip. ?What can I do in the kitchen? ?Clean up any spills right away. ?Avoid walking on wet floors. ?Keep items that you use a lot in easy-to-reach places. ?If you need to reach something above you, use a strong step stool that has a grab bar. ?Keep electrical cords out of the way. ?Do not use floor polish or wax that makes floors slippery. If you must use wax, use non-skid floor wax. ?Do not have throw rugs and other things on the floor that can make you trip. ?What can I  do with my stairs? ?Do not leave any items on the stairs. ?Make sure that there are handrails on both sides of the stairs and use them. Fix handrails that are broken or loose. Make sure that handrails are as long as the stairways. ?Check any carpeting to make sure that it is firmly attached to the stairs. Fix any carpet that is loose or worn. ?Avoid having throw rugs at the top or bottom of the stairs. If you do have throw rugs, attach them to the floor with carpet tape. ?Make sure that you have a light switch at the top of the stairs and the bottom of the stairs. If you do not have them, ask someone to add them for you. ?What else can I do to help prevent falls? ?Wear shoes that: ?Do not have high heels. ?Have  rubber bottoms. ?Are comfortable and fit you well. ?Are closed at the toe. Do not wear sandals. ?If you use a stepladder: ?Make sure that it is fully opened. Do not climb a closed stepladder. ?Make

## 2022-04-14 ENCOUNTER — Ambulatory Visit (INDEPENDENT_AMBULATORY_CARE_PROVIDER_SITE_OTHER): Payer: Medicare Other | Admitting: Family Medicine

## 2022-04-14 ENCOUNTER — Encounter: Payer: Self-pay | Admitting: Family Medicine

## 2022-04-14 VITALS — BP 120/61 | HR 95 | Temp 98.5°F | Ht 68.0 in | Wt 126.1 lb

## 2022-04-14 DIAGNOSIS — R1310 Dysphagia, unspecified: Secondary | ICD-10-CM

## 2022-04-14 DIAGNOSIS — C099 Malignant neoplasm of tonsil, unspecified: Secondary | ICD-10-CM | POA: Diagnosis not present

## 2022-04-14 DIAGNOSIS — L989 Disorder of the skin and subcutaneous tissue, unspecified: Secondary | ICD-10-CM | POA: Diagnosis not present

## 2022-04-14 NOTE — Progress Notes (Signed)
? ?  Acute Office Visit ? ?Subjective:  ? ?  ?Patient ID: [name], male    DOB: 05-30-1951, 71 y.o.   MRN: [id number] ? ?Chief Complaint  ?Patient presents with  ? skin lesion  ? ? ?HPI ?Patient is in today for a skin lesion. He a a spot on his left forearm that has been present for year. It has gotten larger. It is sometimes bothersome and itchy. Sometimes it will scab over. He denies tenderness, drainage, or erythema. He has never seen a dermatologist.  ? ?He would like to know what the next step is as far as treatment for mobility in his neck and his difficulty swallowing.  ? ? ?ROS ?As per HPI. ? ?   ?Objective:  ?  ?BP 120/61   Pulse 95   Temp 98.5 ?F (36.9 ?C) (Temporal)   Ht 5\' 8"  (1.727 m)   Wt 126 lb 2 oz (57.2 kg)   BMI 19.18 kg/m?  ?BP Readings from Last 3 Encounters:  ?04/14/22 120/61  ?02/26/22 114/66  ?02/11/22 115/69  ? ?  ? ?Physical Exam ?Vitals and nursing note reviewed.  ?Constitutional:   ?   General: He is not in acute distress. ?   Appearance: He is not ill-appearing, toxic-appearing or diaphoretic.  ?Cardiovascular:  ?   Rate and Rhythm: Normal rate and regular rhythm.  ?   Heart sounds: Normal heart sounds. No murmur heard. ?Pulmonary:  ?   Effort: Pulmonary effort is normal. No respiratory distress.  ?   Breath sounds: Normal breath sounds.  ?Skin: ?   General: Skin is warm and dry.  ?   Findings: Lesion (2 cm raised scaly lesion to left forearm) present.  ?Neurological:  ?   Mental Status: He is alert and oriented to person, place, and time. Mental status is at baseline.  ?Psychiatric:     ?   Mood and Affect: Mood normal.     ?   Behavior: Behavior normal.  ? ? ?No results found for any visits on 04/14/22. ? ? ?   ?Assessment & Plan:  ? ?[name] was seen today for skin lesion. ? ?Diagnoses and all orders for this visit: ? ?Skin lesion ?Referral placed.  ?-     Ambulatory referral to Dermatology ? ?Tonsillar cancer ([facility]) ?Dysphagia, unspecified type ?Spoke with patient and his sister  regarding next steps. Reviewed notes from specialists. Dr. Langston Masker offered to reach out to [facility] for [facility] treatment. It is also due for another appt with ENT, patient instructed to call both Dr. Langston Masker and Dr. Suszanne Conners.   ? ? ?Return in about 6 weeks (around 05/29/2022) for follow up. ? ?Gabriel Earing, FNP ? ? ?

## 2022-04-24 DIAGNOSIS — C099 Malignant neoplasm of tonsil, unspecified: Secondary | ICD-10-CM | POA: Diagnosis not present

## 2022-04-24 DIAGNOSIS — R1310 Dysphagia, unspecified: Secondary | ICD-10-CM | POA: Diagnosis not present

## 2022-04-24 DIAGNOSIS — J387 Other diseases of larynx: Secondary | ICD-10-CM | POA: Diagnosis not present

## 2022-05-01 ENCOUNTER — Other Ambulatory Visit: Payer: Self-pay | Admitting: Family Medicine

## 2022-06-02 DIAGNOSIS — R1310 Dysphagia, unspecified: Secondary | ICD-10-CM | POA: Diagnosis not present

## 2022-06-02 DIAGNOSIS — C099 Malignant neoplasm of tonsil, unspecified: Secondary | ICD-10-CM | POA: Diagnosis not present

## 2022-06-02 DIAGNOSIS — J387 Other diseases of larynx: Secondary | ICD-10-CM | POA: Diagnosis not present

## 2022-06-04 ENCOUNTER — Other Ambulatory Visit: Payer: Self-pay | Admitting: Family Medicine

## 2022-07-03 ENCOUNTER — Other Ambulatory Visit: Payer: Self-pay | Admitting: Family Medicine

## 2022-07-07 DIAGNOSIS — C099 Malignant neoplasm of tonsil, unspecified: Secondary | ICD-10-CM | POA: Diagnosis not present

## 2022-07-07 DIAGNOSIS — R1310 Dysphagia, unspecified: Secondary | ICD-10-CM | POA: Diagnosis not present

## 2022-07-07 DIAGNOSIS — J387 Other diseases of larynx: Secondary | ICD-10-CM | POA: Diagnosis not present

## 2022-07-30 DIAGNOSIS — Z9221 Personal history of antineoplastic chemotherapy: Secondary | ICD-10-CM | POA: Diagnosis not present

## 2022-07-30 DIAGNOSIS — D709 Neutropenia, unspecified: Secondary | ICD-10-CM | POA: Diagnosis not present

## 2022-07-30 DIAGNOSIS — K222 Esophageal obstruction: Secondary | ICD-10-CM | POA: Diagnosis not present

## 2022-07-30 DIAGNOSIS — Z974 Presence of external hearing-aid: Secondary | ICD-10-CM | POA: Diagnosis not present

## 2022-07-30 DIAGNOSIS — H9190 Unspecified hearing loss, unspecified ear: Secondary | ICD-10-CM | POA: Diagnosis not present

## 2022-07-30 DIAGNOSIS — Z931 Gastrostomy status: Secondary | ICD-10-CM | POA: Diagnosis not present

## 2022-07-30 DIAGNOSIS — R252 Cramp and spasm: Secondary | ICD-10-CM | POA: Diagnosis not present

## 2022-07-30 DIAGNOSIS — E039 Hypothyroidism, unspecified: Secondary | ICD-10-CM | POA: Diagnosis not present

## 2022-07-30 DIAGNOSIS — L989 Disorder of the skin and subcutaneous tissue, unspecified: Secondary | ICD-10-CM | POA: Diagnosis not present

## 2022-07-30 DIAGNOSIS — C099 Malignant neoplasm of tonsil, unspecified: Secondary | ICD-10-CM | POA: Diagnosis not present

## 2022-07-30 DIAGNOSIS — R490 Dysphonia: Secondary | ICD-10-CM | POA: Diagnosis not present

## 2022-07-30 DIAGNOSIS — J701 Chronic and other pulmonary manifestations due to radiation: Secondary | ICD-10-CM | POA: Diagnosis not present

## 2022-07-30 DIAGNOSIS — R530 Neoplastic (malignant) related fatigue: Secondary | ICD-10-CM | POA: Diagnosis not present

## 2022-07-30 DIAGNOSIS — Z923 Personal history of irradiation: Secondary | ICD-10-CM | POA: Diagnosis not present

## 2022-07-30 DIAGNOSIS — L598 Other specified disorders of the skin and subcutaneous tissue related to radiation: Secondary | ICD-10-CM | POA: Diagnosis not present

## 2022-07-30 DIAGNOSIS — K117 Disturbances of salivary secretion: Secondary | ICD-10-CM | POA: Diagnosis not present

## 2022-08-08 ENCOUNTER — Other Ambulatory Visit: Payer: Self-pay | Admitting: Family Medicine

## 2022-08-15 DIAGNOSIS — C099 Malignant neoplasm of tonsil, unspecified: Secondary | ICD-10-CM | POA: Diagnosis not present

## 2022-08-15 DIAGNOSIS — J387 Other diseases of larynx: Secondary | ICD-10-CM | POA: Diagnosis not present

## 2022-08-15 DIAGNOSIS — R1310 Dysphagia, unspecified: Secondary | ICD-10-CM | POA: Diagnosis not present

## 2022-08-18 DIAGNOSIS — L598 Other specified disorders of the skin and subcutaneous tissue related to radiation: Secondary | ICD-10-CM | POA: Diagnosis not present

## 2022-08-18 DIAGNOSIS — M25512 Pain in left shoulder: Secondary | ICD-10-CM | POA: Diagnosis not present

## 2022-08-18 DIAGNOSIS — M542 Cervicalgia: Secondary | ICD-10-CM | POA: Diagnosis not present

## 2022-08-18 DIAGNOSIS — G8929 Other chronic pain: Secondary | ICD-10-CM | POA: Diagnosis not present

## 2022-08-18 DIAGNOSIS — C099 Malignant neoplasm of tonsil, unspecified: Secondary | ICD-10-CM | POA: Diagnosis not present

## 2022-08-18 DIAGNOSIS — Z923 Personal history of irradiation: Secondary | ICD-10-CM | POA: Diagnosis not present

## 2022-08-18 DIAGNOSIS — R530 Neoplastic (malignant) related fatigue: Secondary | ICD-10-CM | POA: Diagnosis not present

## 2022-08-21 DIAGNOSIS — R1310 Dysphagia, unspecified: Secondary | ICD-10-CM | POA: Diagnosis not present

## 2022-08-21 DIAGNOSIS — J387 Other diseases of larynx: Secondary | ICD-10-CM | POA: Diagnosis not present

## 2022-08-21 DIAGNOSIS — C099 Malignant neoplasm of tonsil, unspecified: Secondary | ICD-10-CM | POA: Diagnosis not present

## 2022-08-30 DIAGNOSIS — M65822 Other synovitis and tenosynovitis, left upper arm: Secondary | ICD-10-CM | POA: Diagnosis not present

## 2022-08-30 DIAGNOSIS — M7552 Bursitis of left shoulder: Secondary | ICD-10-CM | POA: Diagnosis not present

## 2022-08-30 DIAGNOSIS — S46812A Strain of other muscles, fascia and tendons at shoulder and upper arm level, left arm, initial encounter: Secondary | ICD-10-CM | POA: Diagnosis not present

## 2022-08-30 DIAGNOSIS — M67814 Other specified disorders of tendon, left shoulder: Secondary | ICD-10-CM | POA: Diagnosis not present

## 2022-08-30 DIAGNOSIS — M19012 Primary osteoarthritis, left shoulder: Secondary | ICD-10-CM | POA: Diagnosis not present

## 2022-09-01 DIAGNOSIS — M25512 Pain in left shoulder: Secondary | ICD-10-CM | POA: Diagnosis not present

## 2022-09-01 DIAGNOSIS — R1310 Dysphagia, unspecified: Secondary | ICD-10-CM | POA: Diagnosis not present

## 2022-09-01 DIAGNOSIS — M542 Cervicalgia: Secondary | ICD-10-CM | POA: Diagnosis not present

## 2022-09-01 DIAGNOSIS — G8929 Other chronic pain: Secondary | ICD-10-CM | POA: Diagnosis not present

## 2022-09-03 DIAGNOSIS — M25512 Pain in left shoulder: Secondary | ICD-10-CM | POA: Diagnosis not present

## 2022-09-03 DIAGNOSIS — C099 Malignant neoplasm of tonsil, unspecified: Secondary | ICD-10-CM | POA: Diagnosis not present

## 2022-09-03 DIAGNOSIS — Z931 Gastrostomy status: Secondary | ICD-10-CM | POA: Diagnosis not present

## 2022-09-03 DIAGNOSIS — M542 Cervicalgia: Secondary | ICD-10-CM | POA: Diagnosis not present

## 2022-09-03 DIAGNOSIS — I890 Lymphedema, not elsewhere classified: Secondary | ICD-10-CM | POA: Diagnosis not present

## 2022-09-03 DIAGNOSIS — R1312 Dysphagia, oropharyngeal phase: Secondary | ICD-10-CM | POA: Diagnosis not present

## 2022-09-03 DIAGNOSIS — M25612 Stiffness of left shoulder, not elsewhere classified: Secondary | ICD-10-CM | POA: Diagnosis not present

## 2022-09-03 DIAGNOSIS — Z923 Personal history of irradiation: Secondary | ICD-10-CM | POA: Diagnosis not present

## 2022-09-03 DIAGNOSIS — R29898 Other symptoms and signs involving the musculoskeletal system: Secondary | ICD-10-CM | POA: Diagnosis not present

## 2022-09-03 DIAGNOSIS — G8929 Other chronic pain: Secondary | ICD-10-CM | POA: Diagnosis not present

## 2022-09-05 ENCOUNTER — Emergency Department (HOSPITAL_COMMUNITY)
Admission: EM | Admit: 2022-09-05 | Discharge: 2022-09-05 | Disposition: A | Discharge status: Home / Self Care | Payer: Medicare Other | Attending: Emergency Medicine | Admitting: Emergency Medicine

## 2022-09-05 ENCOUNTER — Encounter (HOSPITAL_COMMUNITY): Payer: Self-pay | Admitting: *Deleted

## 2022-09-05 ENCOUNTER — Emergency Department (HOSPITAL_COMMUNITY): Payer: Medicare Other

## 2022-09-05 ENCOUNTER — Other Ambulatory Visit: Payer: Self-pay

## 2022-09-05 DIAGNOSIS — K9423 Gastrostomy malfunction: Secondary | ICD-10-CM

## 2022-09-05 DIAGNOSIS — Z85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, oral cavity, and pharynx: Secondary | ICD-10-CM | POA: Diagnosis not present

## 2022-09-05 DIAGNOSIS — Z4659 Encounter for fitting and adjustment of other gastrointestinal appliance and device: Secondary | ICD-10-CM | POA: Diagnosis not present

## 2022-09-05 MED ORDER — DIATRIZOATE MEGLUMINE & SODIUM 66-10 % PO SOLN
ORAL | Status: DC
Start: 2022-09-05 — End: 2022-09-05
  Filled 2022-09-05: qty 30

## 2022-09-05 NOTE — ED Triage Notes (Signed)
Feeding tube came out 0200 this am

## 2022-09-05 NOTE — Discharge Instructions (Signed)
PEG tube after replacement- x-ray shows in the correct position.  Follow-up outpatient with primary care provider

## 2022-09-05 NOTE — ED Provider Notes (Signed)
[facility] EMERGENCY DEPARTMENT Provider Note   CSN: [id number] Arrival date & time: 09/05/22  0845    History  Chief Complaint  Patient presents with   feeding tube came out    [name] is a 71 y.o. 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 PEG tube came out about 0200 this morning. Known chronic erythema surrounding area. No drainage, pain. He has been otherwise well.  HPI     Home Medications Prior to Admission medications   Medication Sig Start Date End Date Taking? Authorizing Provider  albuterol (VENTOLIN HFA) 108 (90 Base) MCG/ACT inhaler Inhale 2 puffs into the lungs every 6 (six) hours as needed for wheezing or shortness of breath. 11/28/21   Gwenlyn Perking, FNP  Ensure Plus (ENSURE PLUS) LIQD Give via PEG. 2 bottles in morning. 2 in afternoon. 1 before bed. Patient taking differently: Place 237 mLs into feeding tube 2 (two) times daily between meals. Pt uses 2 cans in the morning and 2 in the evening via peg tube 03/22/19   Lockamy, Randi L, NP-C  gabapentin (NEURONTIN) 250 MG/5ML solution TAKE 5 ML BY MOUTH TWICE DAILY THROUGH FEEDING TUBE 08/11/22   Gwenlyn Perking, FNP  HYDROcodone-acetaminophen (HYCET) 7.5-325 mg/15 ml solution Take one teaspoon every 6 hours for pain 02/07/22   Milton Ferguson, MD  ibuprofen (ADVIL,MOTRIN) 200 MG tablet Take 400 mg by mouth every 6 (six) hours as needed for moderate pain.     [provider]  levothyroxine (SYNTHROID) 112 MCG tablet Take 1 tablet (112 mcg total) by mouth daily before breakfast. 12/02/21   Gwenlyn Perking, FNP  OVER THE COUNTER MEDICATION Naked juice protein    [provider]  OVER THE COUNTER MEDICATION Take 1 Bottle by mouth daily. [employer] Furn[employer] conservator/restorer, Historical, MD  OVER THE COUNTER MEDICATION Take 1 Bottle by mouth daily. [employer] protein    [provider]   pregabalin (LYRICA) 150 MG capsule Take 1 capsule (150 mg total) by mouth 2 (two) times daily. Feeding tube 09/29/20   Derek Jack, MD      Allergies    Patient has no known allergies.    Review of Systems   Review of Systems  Constitutional: Negative.   HENT: Negative.    Respiratory: Negative.    Cardiovascular: Negative.   Gastrointestinal: Negative.   Genitourinary: Negative.   Musculoskeletal: Negative.   Skin:  Positive for rash.  Neurological: Negative.   All other systems reviewed and are negative.   Physical Exam Updated Vital Signs BP 124/72   Pulse 86   Temp 98.1 F (36.7 C) (Oral)   Resp 18   Ht '5\' 8"'$  (1.727 m)   Wt 53.5 kg   SpO2 99%   BMI 17.94 kg/m  Physical Exam Vitals and nursing note reviewed.  Constitutional:      General: He is not in acute distress.    Appearance: He is well-developed. He is not ill-appearing or diaphoretic.  HENT:     Head: Atraumatic.  Eyes:     Pupils: Pupils are equal, round, and reactive to light.  Cardiovascular:     Rate and Rhythm: Normal rate and regular rhythm.  Pulmonary:     Effort: Pulmonary effort is normal. No respiratory distress.  Abdominal:     General: There is no distension.     Palpations: Abdomen is soft.  Comments: Soft non tender. Peg tube location to mid abdomen without peg tube in place. Surrounding erythema/ skin break down.   Musculoskeletal:        General: Normal range of motion.     Cervical back: Normal range of motion and neck supple.  Skin:    General: Skin is warm and dry.  Neurological:     General: No focal deficit present.     Mental Status: He is alert and oriented to person, place, and time.    ED Results / Procedures / Treatments   Labs (all labs ordered are listed, but only abnormal results are displayed) Labs Reviewed - No data to display  EKG None  Radiology DG Abdomen 1 View  Addendum Date: 09/05/2022   ADDENDUM REPORT: 09/05/2022 14:08 ADDENDUM:  Additional images were submitted later. In the later image, there is contrast injected through PEG tube. Contrast remains in the lumen of stomach suggesting intragastric location of tip of PEG tube. There is no extravasation. Electronically Signed   By: Elmer Picker M.D.   On: 09/05/2022 14:08   Result Date: 09/05/2022 CLINICAL DATA:  Replacement of PEG tube EXAM: ABDOMEN - 1 VIEW COMPARISON:  02/08/2022 FINDINGS: Tip of PEG tube is seen in left upper abdomen. Bowel gas pattern is nonspecific. Low position of diaphragms may suggest COPD. Degenerative changes are noted in lumbar spine. No abnormal masses or calcifications are seen in abdomen. IMPRESSION: Tip of PEG tube is seen in left upper abdomen in the area of stomach. Electronically Signed: By: Elmer Picker M.D. On: 09/05/2022 13:25    Procedures FEEDING TUBE REPLACEMENT  Date/Time: 09/05/2022 1:58 PM  Performed by: Nettie Elm, PA-C Authorized by: Nettie Elm, PA-C  Consent: Verbal consent obtained. Written consent not obtained. Risks and benefits: risks, benefits and alternatives were discussed Patient understanding: patient states understanding of the procedure being performed Patient consent: the patient's understanding of the procedure matches consent given Procedure consent: procedure consent matches procedure scheduled Relevant documents: relevant documents present and verified Test results: test results available and properly labeled Site marked: the operative site was marked Imaging studies: imaging studies available Required items: required blood products, implants, devices, and special equipment available Patient identity confirmed: verbally with patient Time out: Immediately prior to procedure a "time out" was called to verify the correct patient, procedure, equipment, support staff and site/side marked as required. Preparation: Patient was prepped and draped in the usual sterile fashion. Indications:  tube dislodged Local anesthesia used: no  Anesthesia: Local anesthesia used: no  Sedation: Patient sedated: no  Tube type: gastrostomy Patient position: supine Procedure type: replacement Tube size: 18 Fr Endoscope used: no Bulb inflation volume: 7 (ml) Bulb inflation fluid: normal saline Placement/position confirmation: x-ray Tube placement difficulty: none Patient tolerance: patient tolerated the procedure well with no immediate complications       Medications Ordered in ED Medications  diatrizoate meglumine-sodium (GASTROGRAFIN) 66-10 % solution (has no administration in time range)    ED Course/ Medical Decision Making/ A&P    71 year old here for evaluation of PEG tube malfunction.  He takes everything through his PEG tube due to known tonsillar cancer.  States he has been otherwise well.  Does have some chronic surrounding skin changes and skin breakdown from his PEG tube which he states is chronic and has not changed.  Was previously seen by Dr. Arnoldo Morale with general surgery for this.  On arrival his PEG tube has been removed.  I was  able to replace this with an 28 French PEG, as this is what he had previously.  Inserted without difficulty, see procedure note.   Imaging personally viewed and interpreted: PEG tube in correct location  Reassessed.  Discussed his imaging.  We will have him follow-up outpatient.   The patient has been appropriately medically screened and/or stabilized in the ED. I have low suspicion for any other emergent medical condition which would require further screening, evaluation or treatment in the ED or require inpatient management.  Patient is hemodynamically stable and in no acute distress.  Patient able to ambulate in department prior to ED.  Evaluation does not show acute pathology that would require ongoing or additional emergent interventions while in the emergency department or further inpatient treatment.  I have discussed the  diagnosis with the patient and answered all questions.  Pain is been managed while in the emergency department and patient has no further complaints prior to discharge.  Patient is comfortable with plan discussed in room and is stable for discharge at this time.  I have discussed strict return precautions for returning to the emergency department.  Patient was encouraged to follow-up with PCP/specialist refer to at discharge.                             Medical Decision Making Amount and/or Complexity of Data Reviewed External Data Reviewed: labs, radiology and notes. Radiology: ordered and independent interpretation performed. Decision-making details documented in ED Course.  Risk OTC drugs. Diagnosis or treatment significantly limited by social determinants of health.          Final Clinical Impression(s) / ED Diagnoses Final diagnoses:  PEG tube malfunction [facility])    Rx / DC Orders ED Discharge Orders     None         Stepahnie Campo A, PA-C 09/05/22 1436    Sherwood Gambler, MD 09/12/22 [phone]

## 2022-09-08 DIAGNOSIS — G8929 Other chronic pain: Secondary | ICD-10-CM | POA: Diagnosis not present

## 2022-09-08 DIAGNOSIS — M542 Cervicalgia: Secondary | ICD-10-CM | POA: Diagnosis not present

## 2022-09-08 DIAGNOSIS — Z923 Personal history of irradiation: Secondary | ICD-10-CM | POA: Diagnosis not present

## 2022-09-08 DIAGNOSIS — I890 Lymphedema, not elsewhere classified: Secondary | ICD-10-CM | POA: Diagnosis not present

## 2022-09-08 DIAGNOSIS — M25512 Pain in left shoulder: Secondary | ICD-10-CM | POA: Diagnosis not present

## 2022-09-08 DIAGNOSIS — R29898 Other symptoms and signs involving the musculoskeletal system: Secondary | ICD-10-CM | POA: Diagnosis not present

## 2022-09-08 DIAGNOSIS — Z931 Gastrostomy status: Secondary | ICD-10-CM | POA: Diagnosis not present

## 2022-09-08 DIAGNOSIS — R1312 Dysphagia, oropharyngeal phase: Secondary | ICD-10-CM | POA: Diagnosis not present

## 2022-09-08 DIAGNOSIS — C099 Malignant neoplasm of tonsil, unspecified: Secondary | ICD-10-CM | POA: Diagnosis not present

## 2022-09-08 DIAGNOSIS — M25612 Stiffness of left shoulder, not elsewhere classified: Secondary | ICD-10-CM | POA: Diagnosis not present

## 2022-09-11 DIAGNOSIS — M25512 Pain in left shoulder: Secondary | ICD-10-CM | POA: Diagnosis not present

## 2022-09-11 DIAGNOSIS — C099 Malignant neoplasm of tonsil, unspecified: Secondary | ICD-10-CM | POA: Diagnosis not present

## 2022-09-11 DIAGNOSIS — Z923 Personal history of irradiation: Secondary | ICD-10-CM | POA: Diagnosis not present

## 2022-09-11 DIAGNOSIS — Z931 Gastrostomy status: Secondary | ICD-10-CM | POA: Diagnosis not present

## 2022-09-11 DIAGNOSIS — G8929 Other chronic pain: Secondary | ICD-10-CM | POA: Diagnosis not present

## 2022-09-11 DIAGNOSIS — R29898 Other symptoms and signs involving the musculoskeletal system: Secondary | ICD-10-CM | POA: Diagnosis not present

## 2022-09-11 DIAGNOSIS — M542 Cervicalgia: Secondary | ICD-10-CM | POA: Diagnosis not present

## 2022-09-11 DIAGNOSIS — R1312 Dysphagia, oropharyngeal phase: Secondary | ICD-10-CM | POA: Diagnosis not present

## 2022-09-11 DIAGNOSIS — I890 Lymphedema, not elsewhere classified: Secondary | ICD-10-CM | POA: Diagnosis not present

## 2022-09-11 DIAGNOSIS — M25612 Stiffness of left shoulder, not elsewhere classified: Secondary | ICD-10-CM | POA: Diagnosis not present

## 2022-09-15 DIAGNOSIS — H6123 Impacted cerumen, bilateral: Secondary | ICD-10-CM | POA: Diagnosis not present

## 2022-09-15 DIAGNOSIS — Z85819 Personal history of malignant neoplasm of unspecified site of lip, oral cavity, and pharynx: Secondary | ICD-10-CM | POA: Diagnosis not present

## 2022-09-17 DIAGNOSIS — C099 Malignant neoplasm of tonsil, unspecified: Secondary | ICD-10-CM | POA: Diagnosis not present

## 2022-09-17 DIAGNOSIS — R1312 Dysphagia, oropharyngeal phase: Secondary | ICD-10-CM | POA: Diagnosis not present

## 2022-09-17 DIAGNOSIS — Z923 Personal history of irradiation: Secondary | ICD-10-CM | POA: Diagnosis not present

## 2022-09-17 DIAGNOSIS — I890 Lymphedema, not elsewhere classified: Secondary | ICD-10-CM | POA: Diagnosis not present

## 2022-09-17 DIAGNOSIS — G8929 Other chronic pain: Secondary | ICD-10-CM | POA: Diagnosis not present

## 2022-09-17 DIAGNOSIS — M25512 Pain in left shoulder: Secondary | ICD-10-CM | POA: Diagnosis not present

## 2022-09-17 DIAGNOSIS — M25612 Stiffness of left shoulder, not elsewhere classified: Secondary | ICD-10-CM | POA: Diagnosis not present

## 2022-09-17 DIAGNOSIS — R29898 Other symptoms and signs involving the musculoskeletal system: Secondary | ICD-10-CM | POA: Diagnosis not present

## 2022-09-17 DIAGNOSIS — Z931 Gastrostomy status: Secondary | ICD-10-CM | POA: Diagnosis not present

## 2022-09-17 DIAGNOSIS — M542 Cervicalgia: Secondary | ICD-10-CM | POA: Diagnosis not present

## 2022-09-23 DIAGNOSIS — M25512 Pain in left shoulder: Secondary | ICD-10-CM | POA: Diagnosis not present

## 2022-09-23 DIAGNOSIS — Z923 Personal history of irradiation: Secondary | ICD-10-CM | POA: Diagnosis not present

## 2022-09-23 DIAGNOSIS — M25612 Stiffness of left shoulder, not elsewhere classified: Secondary | ICD-10-CM | POA: Diagnosis not present

## 2022-09-23 DIAGNOSIS — I890 Lymphedema, not elsewhere classified: Secondary | ICD-10-CM | POA: Diagnosis not present

## 2022-09-23 DIAGNOSIS — Z931 Gastrostomy status: Secondary | ICD-10-CM | POA: Diagnosis not present

## 2022-09-23 DIAGNOSIS — R1312 Dysphagia, oropharyngeal phase: Secondary | ICD-10-CM | POA: Diagnosis not present

## 2022-09-23 DIAGNOSIS — M542 Cervicalgia: Secondary | ICD-10-CM | POA: Diagnosis not present

## 2022-09-23 DIAGNOSIS — R29898 Other symptoms and signs involving the musculoskeletal system: Secondary | ICD-10-CM | POA: Diagnosis not present

## 2022-09-23 DIAGNOSIS — G8929 Other chronic pain: Secondary | ICD-10-CM | POA: Diagnosis not present

## 2022-09-23 DIAGNOSIS — C099 Malignant neoplasm of tonsil, unspecified: Secondary | ICD-10-CM | POA: Diagnosis not present

## 2022-09-24 DIAGNOSIS — Z931 Gastrostomy status: Secondary | ICD-10-CM | POA: Diagnosis not present

## 2022-09-24 DIAGNOSIS — Z923 Personal history of irradiation: Secondary | ICD-10-CM | POA: Diagnosis not present

## 2022-09-24 DIAGNOSIS — R1312 Dysphagia, oropharyngeal phase: Secondary | ICD-10-CM | POA: Diagnosis not present

## 2022-09-24 DIAGNOSIS — I890 Lymphedema, not elsewhere classified: Secondary | ICD-10-CM | POA: Diagnosis not present

## 2022-09-24 DIAGNOSIS — C099 Malignant neoplasm of tonsil, unspecified: Secondary | ICD-10-CM | POA: Diagnosis not present

## 2022-09-24 DIAGNOSIS — M25612 Stiffness of left shoulder, not elsewhere classified: Secondary | ICD-10-CM | POA: Diagnosis not present

## 2022-09-24 DIAGNOSIS — R29898 Other symptoms and signs involving the musculoskeletal system: Secondary | ICD-10-CM | POA: Diagnosis not present

## 2022-09-24 DIAGNOSIS — M25512 Pain in left shoulder: Secondary | ICD-10-CM | POA: Diagnosis not present

## 2022-09-24 DIAGNOSIS — M542 Cervicalgia: Secondary | ICD-10-CM | POA: Diagnosis not present

## 2022-09-24 DIAGNOSIS — G8929 Other chronic pain: Secondary | ICD-10-CM | POA: Diagnosis not present

## 2022-09-25 DIAGNOSIS — R1310 Dysphagia, unspecified: Secondary | ICD-10-CM | POA: Diagnosis not present

## 2022-09-25 DIAGNOSIS — J387 Other diseases of larynx: Secondary | ICD-10-CM | POA: Diagnosis not present

## 2022-09-25 DIAGNOSIS — C099 Malignant neoplasm of tonsil, unspecified: Secondary | ICD-10-CM | POA: Diagnosis not present

## 2022-09-30 DIAGNOSIS — I890 Lymphedema, not elsewhere classified: Secondary | ICD-10-CM | POA: Diagnosis not present

## 2022-09-30 DIAGNOSIS — M25512 Pain in left shoulder: Secondary | ICD-10-CM | POA: Diagnosis not present

## 2022-09-30 DIAGNOSIS — M25612 Stiffness of left shoulder, not elsewhere classified: Secondary | ICD-10-CM | POA: Diagnosis not present

## 2022-09-30 DIAGNOSIS — M542 Cervicalgia: Secondary | ICD-10-CM | POA: Diagnosis not present

## 2022-09-30 DIAGNOSIS — C099 Malignant neoplasm of tonsil, unspecified: Secondary | ICD-10-CM | POA: Diagnosis not present

## 2022-09-30 DIAGNOSIS — R1312 Dysphagia, oropharyngeal phase: Secondary | ICD-10-CM | POA: Diagnosis not present

## 2022-09-30 DIAGNOSIS — Z931 Gastrostomy status: Secondary | ICD-10-CM | POA: Diagnosis not present

## 2022-09-30 DIAGNOSIS — R29898 Other symptoms and signs involving the musculoskeletal system: Secondary | ICD-10-CM | POA: Diagnosis not present

## 2022-09-30 DIAGNOSIS — G8929 Other chronic pain: Secondary | ICD-10-CM | POA: Diagnosis not present

## 2022-09-30 DIAGNOSIS — Z923 Personal history of irradiation: Secondary | ICD-10-CM | POA: Diagnosis not present

## 2022-10-02 DIAGNOSIS — Z931 Gastrostomy status: Secondary | ICD-10-CM | POA: Diagnosis not present

## 2022-10-02 DIAGNOSIS — M25612 Stiffness of left shoulder, not elsewhere classified: Secondary | ICD-10-CM | POA: Diagnosis not present

## 2022-10-02 DIAGNOSIS — G8929 Other chronic pain: Secondary | ICD-10-CM | POA: Diagnosis not present

## 2022-10-02 DIAGNOSIS — M25512 Pain in left shoulder: Secondary | ICD-10-CM | POA: Diagnosis not present

## 2022-10-02 DIAGNOSIS — R1312 Dysphagia, oropharyngeal phase: Secondary | ICD-10-CM | POA: Diagnosis not present

## 2022-10-02 DIAGNOSIS — M542 Cervicalgia: Secondary | ICD-10-CM | POA: Diagnosis not present

## 2022-10-02 DIAGNOSIS — Z923 Personal history of irradiation: Secondary | ICD-10-CM | POA: Diagnosis not present

## 2022-10-02 DIAGNOSIS — I890 Lymphedema, not elsewhere classified: Secondary | ICD-10-CM | POA: Diagnosis not present

## 2022-10-02 DIAGNOSIS — R29898 Other symptoms and signs involving the musculoskeletal system: Secondary | ICD-10-CM | POA: Diagnosis not present

## 2022-10-02 DIAGNOSIS — C099 Malignant neoplasm of tonsil, unspecified: Secondary | ICD-10-CM | POA: Diagnosis not present

## 2022-10-09 ENCOUNTER — Encounter (HOSPITAL_COMMUNITY): Payer: Self-pay

## 2022-10-09 ENCOUNTER — Emergency Department (HOSPITAL_COMMUNITY): Payer: Medicare Other

## 2022-10-09 ENCOUNTER — Emergency Department (HOSPITAL_COMMUNITY)
Admission: EM | Admit: 2022-10-09 | Discharge: 2022-10-09 | Disposition: A | Discharge status: Home / Self Care | Payer: Medicare Other | Attending: Emergency Medicine | Admitting: Emergency Medicine

## 2022-10-09 ENCOUNTER — Other Ambulatory Visit: Payer: Self-pay

## 2022-10-09 DIAGNOSIS — Z87891 Personal history of nicotine dependence: Secondary | ICD-10-CM | POA: Insufficient documentation

## 2022-10-09 DIAGNOSIS — K9423 Gastrostomy malfunction: Secondary | ICD-10-CM | POA: Insufficient documentation

## 2022-10-09 DIAGNOSIS — Z4682 Encounter for fitting and adjustment of non-vascular catheter: Secondary | ICD-10-CM | POA: Diagnosis not present

## 2022-10-09 MED ORDER — DIATRIZOATE MEGLUMINE & SODIUM 66-10 % PO SOLN
ORAL | Status: AC
  Filled 2022-10-09: qty 30

## 2022-10-09 NOTE — ED Triage Notes (Signed)
Pt presents to ED, states his feeding tube came out about 2 hours PTA

## 2022-10-09 NOTE — ED Notes (Signed)
Portable xray at bedside for confirmation of PEG tube placement

## 2022-10-09 NOTE — Discharge Instructions (Signed)
Your PEG tube was changed while in the emergency department.  Attached is PEG tube maintenance care.  Recommend follow-up with your GI doctor for reevaluation.  Please do not hesitate to return to emergency department for worrisome signs symptoms we discussed become apparent.

## 2022-10-09 NOTE — ED Provider Notes (Signed)
[facility] EMERGENCY DEPARTMENT Provider Note   CSN: [id number] Arrival date & time: 10/09/22  1020     History  No chief complaint on file.   [name] is a 71 y.o. male.  HPI   70 year old male presents emergency department with complaints of feeding tube dislocation.  Patient states that he went to feed himself this morning approximately 3 hours ago when his feeding tube came out secondary to balloon deflation.  He reports a second time happening over the past 2 months.  Denies any prior complications.  Denies any fever, chills, drainage from PEG tube site.  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 Authorizing Provider  albuterol (VENTOLIN HFA) 108 (90 Base) MCG/ACT inhaler Inhale 2 puffs into the lungs every 6 (six) hours as needed for wheezing or shortness of breath. 11/28/21   Gwenlyn Perking, FNP  Ensure Plus (ENSURE PLUS) LIQD Give via PEG. 2 bottles in morning. 2 in afternoon. 1 before bed. Patient taking differently: Place 237 mLs into feeding tube 2 (two) times daily between meals. Pt uses 2 cans in the morning and 2 in the evening via peg tube 03/22/19   Lockamy, Randi L, NP-C  gabapentin (NEURONTIN) 250 MG/5ML solution TAKE 5 ML BY MOUTH TWICE DAILY THROUGH FEEDING TUBE 08/11/22   Gwenlyn Perking, FNP  HYDROcodone-acetaminophen (HYCET) 7.5-325 mg/15 ml solution Take one teaspoon every 6 hours for pain 02/07/22   Milton Ferguson, MD  ibuprofen (ADVIL,MOTRIN) 200 MG tablet Take 400 mg by mouth every 6 (six) hours as needed for moderate pain.     [provider]  levothyroxine (SYNTHROID) 112 MCG tablet Take 1 tablet (112 mcg total) by mouth daily before breakfast. 12/02/21   Gwenlyn Perking, FNP  OVER THE COUNTER MEDICATION Naked juice protein    [provider]  OVER THE COUNTER MEDICATION Take  1 Bottle by mouth daily. [employer] Furn[employer] conservator/restorer, Historical, MD  OVER THE COUNTER MEDICATION Take 1 Bottle by mouth daily. [employer] protein    [provider]  pregabalin (LYRICA) 150 MG capsule Take 1 capsule (150 mg total) by mouth 2 (two) times daily. Feeding tube 09/29/20   Derek Jack, MD      Allergies    Patient has no known allergies.    Review of Systems   Review of Systems  All other systems reviewed and are negative.   Physical Exam Updated Vital Signs BP (!) 109/56 (BP Location: Left Arm)   Pulse 81   Temp 98.3 F (36.8 C) (Oral)   Resp 16   Ht '5\' 8"'$  (1.727 m)   Wt 54.4 kg   SpO2 100%   BMI 18.25 kg/m  Physical Exam Vitals and nursing note reviewed.  Constitutional:      General: He is not in acute distress.    Appearance: He is well-developed.  HENT:     Head: Normocephalic and atraumatic.  Eyes:     Conjunctiva/sclera: Conjunctivae normal.  Cardiovascular:     Rate and Rhythm: Normal rate and regular rhythm.     Heart sounds: No murmur heard. Pulmonary:     Effort: Pulmonary effort is normal. No respiratory distress.     Breath sounds: Normal breath sounds.  Abdominal:     Palpations: Abdomen is soft.     Tenderness: There is no abdominal  tenderness.  Musculoskeletal:        General: No swelling.     Cervical back: Neck supple.  Skin:    General: Skin is warm and dry.     Capillary Refill: Capillary refill takes less than 2 seconds.     Comments: Patient's PEG tube site without extending erythema.  No obvious discharge noted from site.  Neurological:     Mental Status: He is alert.  Psychiatric:        Mood and Affect: Mood normal.     ED Results / Procedures / Treatments   Labs (all labs ordered are listed, but only abnormal results are displayed) Labs Reviewed - No data to display  EKG None  Radiology DG ABDOMEN PEG TUBE LOCATION  Result Date: 10/09/2022 CLINICAL DATA:  Feeding tube placement EXAM:  ABDOMEN - 1 VIEW COMPARISON:  09/05/2022 FINDINGS: Single frontal image of the abdomen demonstrates percutaneous gastrostomy tube position within the gastric lumen. There is injected contrast within the tube, gastric lumen, and proximal duodenum. No extra luminal contrast collections. Nonobstructive bowel gas pattern. IMPRESSION: Percutaneous gastrostomy tube appropriately positioned within the gastric lumen. Electronically Signed   By: Davina Poke D.O.   On: 10/09/2022 15:25    Procedures Gastrostomy tube replacement  Date/Time: 10/09/2022 2:47 PM  Performed by: Wilnette Kales, PA Authorized by: Wilnette Kales, PA  Consent: Verbal consent obtained. Risks and benefits: risks, benefits and alternatives were discussed Consent given by: patient Patient understanding: patient states understanding of the procedure being performed Patient identity confirmed: verbally with patient Preparation: Patient was prepped and draped in the usual sterile fashion. Local anesthesia used: no  Anesthesia: Local anesthesia used: no  Sedation: Patient sedated: no  Patient tolerance: patient tolerated the procedure well with no immediate complications Comments: Patient patient had 4 French catheter placed.  We were unable to place similar sized catheter.  16 French tube was in place successfully.       Medications Ordered in ED Medications  diatrizoate meglumine-sodium (GASTROGRAFIN) 66-10 % solution (  Given by Other 10/09/22 1515)    ED Course/ Medical Decision Making/ A&P                           Medical Decision Making Amount and/or Complexity of Data Reviewed Radiology: ordered.   This patient presents to the ED for concern of vaginal function, this involves an extensive number of treatment options, and is a complaint that carries with it a high risk of complications and morbidity.  The differential diagnosis includes feeding tube malfunction   Co morbidities that complicate  the patient evaluation  See HPI   Additional history obtained:  Additional history obtained from EMR External records from outside source obtained and reviewed including hospital records   Lab Tests:  N/a   Imaging Studies ordered:  I ordered imaging studies including DG abdomen PEG tube placement I independently visualized and interpreted imaging which showed percutaneous gastrostomy tube with appropriate placement in the gastric lumen. I agree with the radiologist interpretation  Cardiac Monitoring: / EKG:  The patient was maintained on a cardiac monitor.  I personally viewed and interpreted the cardiac monitored which showed an underlying rhythm of: Sinus rhythm   Consultations Obtained:  N/a   Problem List / ED Course / Critical interventions / Medication management  Feeding tube function  Reevaluation of the patient showed that the patient stayed the same I have reviewed the patients home  medicines and have made adjustments as needed   Social Determinants of Health:  Former cigarette use.  Denies illicit drug use.   Test / Admission - Considered:  G tube malfunction Vitals signs  within normal range and stable throughout visit. Imaging studies significant for: See above Patient's G-tube was placed while emergency department.  46 French was unable to be placed due to excessive resistance.  3 French was placed successfully with confirmed radiographic findings.  Patient recommended follow-up with general surgeon given that tube placed today is a smaller than prior to placed.  Patient educated regarding proper care of G-tube.  Treatment plan discussed at length with patient and he did understand was agreeable to said plan. Worrisome signs and symptoms were discussed with the patient, and the patient acknowledged understanding to return to the ED if noticed. Patient was stable upon discharge.          Final Clinical Impression(s) / ED Diagnoses Final  diagnoses:  PEG tube malfunction [facility])    Rx / DC Orders ED Discharge Orders     None         Wilnette Kales, [name] 10/09/22 Nord, Ankit, MD 10/10/22 [phone]

## 2022-10-09 NOTE — ED Provider Triage Note (Signed)
Emergency Medicine Provider Triage Evaluation Note  [name] , a 71 y.o. male  was evaluated in triage.  Pt complains of feeding tube dislocation.  Patient states that he went to feed himself this morning approximately 3 hours ago when his feeding tube came out secondary to balloon deflation.  He reports a second time happening over the past 2 months.  Denies any prior complications.  Denies any fever, chills, drainage from PEG tube site.  Review of Systems  Positive: See above Negative:   Physical Exam  BP 90/63   Pulse 82   Temp 97.9 F (36.6 C) (Oral)   Ht '5\' 8"'$  (1.727 m)   Wt 54.4 kg   SpO2 99%   BMI 18.25 kg/m  Gen:   Awake, no distress   Resp:  Normal effort  MSK:   Moves extremities without difficulty  Other:  PEG tube site without extending erythematous changes.  No purulent discharge noted.  Medical Decision Making  Medically screening exam initiated at 11:27 AM.  Appropriate orders placed.  YAIR DUSZA was informed that the remainder of the evaluation will be completed by another provider, this initial triage assessment does not replace that evaluation, and the importance of remaining in the ED until their evaluation is complete.     Wilnette Kales, [name] 10/09/22 1128

## 2022-10-30 DIAGNOSIS — C099 Malignant neoplasm of tonsil, unspecified: Secondary | ICD-10-CM | POA: Diagnosis not present

## 2022-10-30 DIAGNOSIS — E039 Hypothyroidism, unspecified: Secondary | ICD-10-CM | POA: Diagnosis not present

## 2022-10-30 DIAGNOSIS — H9193 Unspecified hearing loss, bilateral: Secondary | ICD-10-CM | POA: Diagnosis not present

## 2022-10-30 DIAGNOSIS — M542 Cervicalgia: Secondary | ICD-10-CM | POA: Diagnosis not present

## 2022-10-30 DIAGNOSIS — Z13220 Encounter for screening for lipoid disorders: Secondary | ICD-10-CM | POA: Diagnosis not present

## 2022-10-30 DIAGNOSIS — R6889 Other general symptoms and signs: Secondary | ICD-10-CM | POA: Diagnosis not present

## 2022-10-30 DIAGNOSIS — Z931 Gastrostomy status: Secondary | ICD-10-CM | POA: Diagnosis not present

## 2022-10-30 DIAGNOSIS — Z1211 Encounter for screening for malignant neoplasm of colon: Secondary | ICD-10-CM | POA: Diagnosis not present

## 2022-10-30 DIAGNOSIS — Z131 Encounter for screening for diabetes mellitus: Secondary | ICD-10-CM | POA: Diagnosis not present

## 2022-11-05 DIAGNOSIS — C099 Malignant neoplasm of tonsil, unspecified: Secondary | ICD-10-CM | POA: Diagnosis not present

## 2022-11-05 DIAGNOSIS — J387 Other diseases of larynx: Secondary | ICD-10-CM | POA: Diagnosis not present

## 2022-11-05 DIAGNOSIS — R1310 Dysphagia, unspecified: Secondary | ICD-10-CM | POA: Diagnosis not present

## 2022-11-06 ENCOUNTER — Ambulatory Visit: Payer: Medicare Other | Admitting: Family Medicine

## 2022-11-10 ENCOUNTER — Ambulatory Visit: Payer: Medicare Other

## 2022-11-10 DIAGNOSIS — M542 Cervicalgia: Secondary | ICD-10-CM | POA: Diagnosis not present

## 2022-11-10 DIAGNOSIS — H9193 Unspecified hearing loss, bilateral: Secondary | ICD-10-CM | POA: Diagnosis not present

## 2022-11-10 DIAGNOSIS — Z931 Gastrostomy status: Secondary | ICD-10-CM | POA: Diagnosis not present

## 2022-11-10 DIAGNOSIS — C099 Malignant neoplasm of tonsil, unspecified: Secondary | ICD-10-CM | POA: Diagnosis not present

## 2022-11-10 DIAGNOSIS — Z9181 History of falling: Secondary | ICD-10-CM | POA: Diagnosis not present

## 2022-11-10 DIAGNOSIS — E039 Hypothyroidism, unspecified: Secondary | ICD-10-CM | POA: Diagnosis not present

## 2022-11-11 DIAGNOSIS — C099 Malignant neoplasm of tonsil, unspecified: Secondary | ICD-10-CM | POA: Diagnosis not present

## 2022-11-11 DIAGNOSIS — E039 Hypothyroidism, unspecified: Secondary | ICD-10-CM | POA: Diagnosis not present

## 2022-11-11 DIAGNOSIS — H9193 Unspecified hearing loss, bilateral: Secondary | ICD-10-CM | POA: Diagnosis not present

## 2022-11-11 DIAGNOSIS — Z931 Gastrostomy status: Secondary | ICD-10-CM | POA: Diagnosis not present

## 2022-11-11 DIAGNOSIS — M542 Cervicalgia: Secondary | ICD-10-CM | POA: Diagnosis not present

## 2022-11-11 DIAGNOSIS — Z9181 History of falling: Secondary | ICD-10-CM | POA: Diagnosis not present

## 2022-11-17 DIAGNOSIS — C099 Malignant neoplasm of tonsil, unspecified: Secondary | ICD-10-CM | POA: Diagnosis not present

## 2022-11-17 DIAGNOSIS — M542 Cervicalgia: Secondary | ICD-10-CM | POA: Diagnosis not present

## 2022-11-17 DIAGNOSIS — E039 Hypothyroidism, unspecified: Secondary | ICD-10-CM | POA: Diagnosis not present

## 2022-11-17 DIAGNOSIS — Z9181 History of falling: Secondary | ICD-10-CM | POA: Diagnosis not present

## 2022-11-17 DIAGNOSIS — Z931 Gastrostomy status: Secondary | ICD-10-CM | POA: Diagnosis not present

## 2022-11-17 DIAGNOSIS — H9193 Unspecified hearing loss, bilateral: Secondary | ICD-10-CM | POA: Diagnosis not present

## 2022-11-19 DIAGNOSIS — Z9181 History of falling: Secondary | ICD-10-CM | POA: Diagnosis not present

## 2022-11-19 DIAGNOSIS — Z931 Gastrostomy status: Secondary | ICD-10-CM | POA: Diagnosis not present

## 2022-11-19 DIAGNOSIS — H9193 Unspecified hearing loss, bilateral: Secondary | ICD-10-CM | POA: Diagnosis not present

## 2022-11-19 DIAGNOSIS — E039 Hypothyroidism, unspecified: Secondary | ICD-10-CM | POA: Diagnosis not present

## 2022-11-19 DIAGNOSIS — M542 Cervicalgia: Secondary | ICD-10-CM | POA: Diagnosis not present

## 2022-11-19 DIAGNOSIS — C099 Malignant neoplasm of tonsil, unspecified: Secondary | ICD-10-CM | POA: Diagnosis not present

## 2022-11-20 DIAGNOSIS — E039 Hypothyroidism, unspecified: Secondary | ICD-10-CM | POA: Diagnosis not present

## 2022-11-20 DIAGNOSIS — Z9181 History of falling: Secondary | ICD-10-CM | POA: Diagnosis not present

## 2022-11-20 DIAGNOSIS — M542 Cervicalgia: Secondary | ICD-10-CM | POA: Diagnosis not present

## 2022-11-20 DIAGNOSIS — Z931 Gastrostomy status: Secondary | ICD-10-CM | POA: Diagnosis not present

## 2022-11-20 DIAGNOSIS — C099 Malignant neoplasm of tonsil, unspecified: Secondary | ICD-10-CM | POA: Diagnosis not present

## 2022-11-20 DIAGNOSIS — H9193 Unspecified hearing loss, bilateral: Secondary | ICD-10-CM | POA: Diagnosis not present

## 2022-11-24 DIAGNOSIS — E039 Hypothyroidism, unspecified: Secondary | ICD-10-CM | POA: Diagnosis not present

## 2022-11-24 DIAGNOSIS — H9193 Unspecified hearing loss, bilateral: Secondary | ICD-10-CM | POA: Diagnosis not present

## 2022-11-24 DIAGNOSIS — Z931 Gastrostomy status: Secondary | ICD-10-CM | POA: Diagnosis not present

## 2022-11-24 DIAGNOSIS — M542 Cervicalgia: Secondary | ICD-10-CM | POA: Diagnosis not present

## 2022-11-24 DIAGNOSIS — C099 Malignant neoplasm of tonsil, unspecified: Secondary | ICD-10-CM | POA: Diagnosis not present

## 2022-11-24 DIAGNOSIS — Z9181 History of falling: Secondary | ICD-10-CM | POA: Diagnosis not present

## 2022-11-25 DIAGNOSIS — Z931 Gastrostomy status: Secondary | ICD-10-CM | POA: Diagnosis not present

## 2022-11-25 DIAGNOSIS — M542 Cervicalgia: Secondary | ICD-10-CM | POA: Diagnosis not present

## 2022-11-25 DIAGNOSIS — H9193 Unspecified hearing loss, bilateral: Secondary | ICD-10-CM | POA: Diagnosis not present

## 2022-11-25 DIAGNOSIS — Z9181 History of falling: Secondary | ICD-10-CM | POA: Diagnosis not present

## 2022-11-25 DIAGNOSIS — C099 Malignant neoplasm of tonsil, unspecified: Secondary | ICD-10-CM | POA: Diagnosis not present

## 2022-11-25 DIAGNOSIS — E039 Hypothyroidism, unspecified: Secondary | ICD-10-CM | POA: Diagnosis not present

## 2022-11-26 DIAGNOSIS — Z931 Gastrostomy status: Secondary | ICD-10-CM | POA: Diagnosis not present

## 2022-11-26 DIAGNOSIS — C099 Malignant neoplasm of tonsil, unspecified: Secondary | ICD-10-CM | POA: Diagnosis not present

## 2022-11-26 DIAGNOSIS — M542 Cervicalgia: Secondary | ICD-10-CM | POA: Diagnosis not present

## 2022-11-26 DIAGNOSIS — E039 Hypothyroidism, unspecified: Secondary | ICD-10-CM | POA: Diagnosis not present

## 2022-11-26 DIAGNOSIS — Z9181 History of falling: Secondary | ICD-10-CM | POA: Diagnosis not present

## 2022-11-26 DIAGNOSIS — H9193 Unspecified hearing loss, bilateral: Secondary | ICD-10-CM | POA: Diagnosis not present

## 2022-11-27 DIAGNOSIS — Z9181 History of falling: Secondary | ICD-10-CM | POA: Diagnosis not present

## 2022-11-27 DIAGNOSIS — M542 Cervicalgia: Secondary | ICD-10-CM | POA: Diagnosis not present

## 2022-11-27 DIAGNOSIS — Z931 Gastrostomy status: Secondary | ICD-10-CM | POA: Diagnosis not present

## 2022-11-27 DIAGNOSIS — H9193 Unspecified hearing loss, bilateral: Secondary | ICD-10-CM | POA: Diagnosis not present

## 2022-11-27 DIAGNOSIS — E039 Hypothyroidism, unspecified: Secondary | ICD-10-CM | POA: Diagnosis not present

## 2022-11-27 DIAGNOSIS — C099 Malignant neoplasm of tonsil, unspecified: Secondary | ICD-10-CM | POA: Diagnosis not present

## 2022-12-02 DIAGNOSIS — M542 Cervicalgia: Secondary | ICD-10-CM | POA: Diagnosis not present

## 2022-12-02 DIAGNOSIS — Z9181 History of falling: Secondary | ICD-10-CM | POA: Diagnosis not present

## 2022-12-02 DIAGNOSIS — H9193 Unspecified hearing loss, bilateral: Secondary | ICD-10-CM | POA: Diagnosis not present

## 2022-12-02 DIAGNOSIS — Z931 Gastrostomy status: Secondary | ICD-10-CM | POA: Diagnosis not present

## 2022-12-02 DIAGNOSIS — C099 Malignant neoplasm of tonsil, unspecified: Secondary | ICD-10-CM | POA: Diagnosis not present

## 2022-12-02 DIAGNOSIS — E039 Hypothyroidism, unspecified: Secondary | ICD-10-CM | POA: Diagnosis not present

## 2022-12-03 DIAGNOSIS — M542 Cervicalgia: Secondary | ICD-10-CM | POA: Diagnosis not present

## 2022-12-03 DIAGNOSIS — C099 Malignant neoplasm of tonsil, unspecified: Secondary | ICD-10-CM | POA: Diagnosis not present

## 2022-12-03 DIAGNOSIS — E039 Hypothyroidism, unspecified: Secondary | ICD-10-CM | POA: Diagnosis not present

## 2022-12-03 DIAGNOSIS — Z931 Gastrostomy status: Secondary | ICD-10-CM | POA: Diagnosis not present

## 2022-12-03 DIAGNOSIS — H9193 Unspecified hearing loss, bilateral: Secondary | ICD-10-CM | POA: Diagnosis not present

## 2022-12-03 DIAGNOSIS — Z9181 History of falling: Secondary | ICD-10-CM | POA: Diagnosis not present

## 2022-12-04 DIAGNOSIS — Z931 Gastrostomy status: Secondary | ICD-10-CM | POA: Diagnosis not present

## 2022-12-04 DIAGNOSIS — M542 Cervicalgia: Secondary | ICD-10-CM | POA: Diagnosis not present

## 2022-12-04 DIAGNOSIS — E039 Hypothyroidism, unspecified: Secondary | ICD-10-CM | POA: Diagnosis not present

## 2022-12-04 DIAGNOSIS — H9193 Unspecified hearing loss, bilateral: Secondary | ICD-10-CM | POA: Diagnosis not present

## 2022-12-04 DIAGNOSIS — Z9181 History of falling: Secondary | ICD-10-CM | POA: Diagnosis not present

## 2022-12-04 DIAGNOSIS — C099 Malignant neoplasm of tonsil, unspecified: Secondary | ICD-10-CM | POA: Diagnosis not present

## 2022-12-09 DIAGNOSIS — H9193 Unspecified hearing loss, bilateral: Secondary | ICD-10-CM | POA: Diagnosis not present

## 2022-12-09 DIAGNOSIS — M542 Cervicalgia: Secondary | ICD-10-CM | POA: Diagnosis not present

## 2022-12-09 DIAGNOSIS — C099 Malignant neoplasm of tonsil, unspecified: Secondary | ICD-10-CM | POA: Diagnosis not present

## 2022-12-09 DIAGNOSIS — Z9181 History of falling: Secondary | ICD-10-CM | POA: Diagnosis not present

## 2022-12-09 DIAGNOSIS — E039 Hypothyroidism, unspecified: Secondary | ICD-10-CM | POA: Diagnosis not present

## 2022-12-09 DIAGNOSIS — Z931 Gastrostomy status: Secondary | ICD-10-CM | POA: Diagnosis not present

## 2022-12-10 DIAGNOSIS — H9193 Unspecified hearing loss, bilateral: Secondary | ICD-10-CM | POA: Diagnosis not present

## 2022-12-10 DIAGNOSIS — E039 Hypothyroidism, unspecified: Secondary | ICD-10-CM | POA: Diagnosis not present

## 2022-12-10 DIAGNOSIS — M542 Cervicalgia: Secondary | ICD-10-CM | POA: Diagnosis not present

## 2022-12-10 DIAGNOSIS — C099 Malignant neoplasm of tonsil, unspecified: Secondary | ICD-10-CM | POA: Diagnosis not present

## 2022-12-10 DIAGNOSIS — Z931 Gastrostomy status: Secondary | ICD-10-CM | POA: Diagnosis not present

## 2022-12-10 DIAGNOSIS — Z9181 History of falling: Secondary | ICD-10-CM | POA: Diagnosis not present

## 2022-12-11 DIAGNOSIS — M542 Cervicalgia: Secondary | ICD-10-CM | POA: Diagnosis not present

## 2022-12-11 DIAGNOSIS — E039 Hypothyroidism, unspecified: Secondary | ICD-10-CM | POA: Diagnosis not present

## 2022-12-11 DIAGNOSIS — Z9181 History of falling: Secondary | ICD-10-CM | POA: Diagnosis not present

## 2022-12-11 DIAGNOSIS — C099 Malignant neoplasm of tonsil, unspecified: Secondary | ICD-10-CM | POA: Diagnosis not present

## 2022-12-11 DIAGNOSIS — Z931 Gastrostomy status: Secondary | ICD-10-CM | POA: Diagnosis not present

## 2022-12-11 DIAGNOSIS — H9193 Unspecified hearing loss, bilateral: Secondary | ICD-10-CM | POA: Diagnosis not present

## 2022-12-18 DIAGNOSIS — M542 Cervicalgia: Secondary | ICD-10-CM | POA: Diagnosis not present

## 2022-12-18 DIAGNOSIS — H9193 Unspecified hearing loss, bilateral: Secondary | ICD-10-CM | POA: Diagnosis not present

## 2022-12-18 DIAGNOSIS — C099 Malignant neoplasm of tonsil, unspecified: Secondary | ICD-10-CM | POA: Diagnosis not present

## 2022-12-18 DIAGNOSIS — Z9181 History of falling: Secondary | ICD-10-CM | POA: Diagnosis not present

## 2022-12-18 DIAGNOSIS — Z931 Gastrostomy status: Secondary | ICD-10-CM | POA: Diagnosis not present

## 2022-12-18 DIAGNOSIS — E039 Hypothyroidism, unspecified: Secondary | ICD-10-CM | POA: Diagnosis not present

## 2022-12-19 DIAGNOSIS — C099 Malignant neoplasm of tonsil, unspecified: Secondary | ICD-10-CM | POA: Diagnosis not present

## 2022-12-19 DIAGNOSIS — R1310 Dysphagia, unspecified: Secondary | ICD-10-CM | POA: Diagnosis not present

## 2022-12-19 DIAGNOSIS — J387 Other diseases of larynx: Secondary | ICD-10-CM | POA: Diagnosis not present

## 2023-02-05 ENCOUNTER — Other Ambulatory Visit: Payer: Self-pay | Admitting: Family Medicine

## 2023-02-05 DIAGNOSIS — E039 Hypothyroidism, unspecified: Secondary | ICD-10-CM

## 2023-02-06 ENCOUNTER — Other Ambulatory Visit: Payer: Self-pay | Admitting: Family Medicine

## 2023-02-06 DIAGNOSIS — E039 Hypothyroidism, unspecified: Secondary | ICD-10-CM
